# Patient Record
Sex: Male | Born: 1970
Health system: Southern US, Community
[De-identification: ages and names within clinical notes are randomized; demographics above are authoritative.]

## PROBLEM LIST (undated history)

## (undated) DIAGNOSIS — I2510 Atherosclerotic heart disease of native coronary artery without angina pectoris: Secondary | ICD-10-CM

## (undated) DIAGNOSIS — I219 Acute myocardial infarction, unspecified: Secondary | ICD-10-CM

## (undated) DIAGNOSIS — F419 Anxiety disorder, unspecified: Secondary | ICD-10-CM

## (undated) HISTORY — PX: FRACTURE SURGERY: SHX138

## (undated) HISTORY — PX: BACK SURGERY: SHX140

## (undated) HISTORY — PX: CARDIAC CATHETERIZATION: SHX172

## (undated) HISTORY — PX: CORONARY ANGIOPLASTY: SHX604

---

## 2004-11-05 ENCOUNTER — Emergency Department (HOSPITAL_COMMUNITY): Admission: EM | Admit: 2004-11-05 | Discharge: 2004-11-05 | Payer: Self-pay | Admitting: Emergency Medicine

## 2008-07-24 ENCOUNTER — Encounter: Admission: RE | Admit: 2008-07-24 | Discharge: 2008-07-24 | Payer: Self-pay | Admitting: Orthopedic Surgery

## 2008-08-27 ENCOUNTER — Ambulatory Visit (HOSPITAL_COMMUNITY): Admission: RE | Admit: 2008-08-27 | Discharge: 2008-08-28 | Payer: Self-pay | Admitting: Orthopedic Surgery

## 2008-09-25 HISTORY — PX: OTHER SURGICAL HISTORY: SHX169

## 2011-02-07 NOTE — Op Note (Signed)
NAMEPRAYAN, ULIN               ACCOUNT NO.:  [id number]   MEDICAL RECORD NO.:  [id number]          PATIENT TYPE:  OIB   LOCATION:  5041                         FACILITY:  [facility]   PHYSICIAN:  Vania Rea. Supple, M.D.  DATE OF BIRTH:  06/27/1971   DATE OF PROCEDURE:  08/27/2008  DATE OF DISCHARGE:                               OPERATIVE REPORT   PREOPERATIVE DIAGNOSIS:  Right clavicle malaligned nonunion.   POSTOPERATIVE DIAGNOSIS:  Right clavicle malaligned nonunion.   PROCEDURE:  Open reduction and internal fixation of right clavicle  nonunion with the supplementation of local bone graft.   SURGEON:  Vania Rea. Supple, MD   ASSISTANT:  Lucita Lora. Shuford, PA-C   ANESTHESIA:  General endotracheal as well as local.   ESTIMATED BLOOD LOSS:  Minimal.   DRAINS:  None.   HISTORY:  Mr. Freimark is a 40 year old gentleman who sustained a right  clavicle fracture back in 2008 and then unfortunately went onto a  nonunion which became progressively more painful.  He had recent fall  reinjuring the right shoulder and significantly increasing his symptoms.  The current examination shows a painful and locally tender nonunion of  the right mid clavicle.  Radiographs and CT confirmed a nonunion.  There  was ongoing pain and functional limitations.  He is brought to the  operating room at this time for planned right clavicle ORIF with local  bone grafting.   Preoperatively, we counseled Mr. Massman on treatment options as well as  risks versus benefits thereof.  Possible surgical complications of  bleeding, infection, neurovascular injury, malunion, nonunion, loss of  fixation, and possible need for additional surgery are reviewed.  He  understands and accepts and agrees with our planned procedure.   PROCEDURE IN DETAIL:  After undergoing routine preop evaluation, the  patient received prophylactic antibiotics.  He was placed supine on the  operating table and underwent smooth induction of a  general endotracheal  anesthesia.  He was placed in the beach-chair position, appropriately  padded and protected.  The right shoulder girdle region was sterilely  prepped and draped in the standard fashion.  A transverse incision  following the course of the clavicle was made approximately 8 cm in  length, centered over the clavicle along its more anterior margin.  Skin  flaps were elevated and electrocautery was used for hemostasis.  Dissection was carried down deeply to the subcutaneous border of the  clavicle and then electrocautery was used to divide the periosteum and  then subperiosteal elevation was used to expose the clavicle medially  and laterally to the nonunion site and the nonunion was then  circumferentially exposed with electrocautery and sharp dissection.  There was a large amount of callus and a rongeur was used to trim the  malunited bone fragments and the bone was carefully collected to be used  for later bone grafting.  The nonunion site was then readily visualized  and the interposed soft tissue of the nonunion site was then divided.  The fracture ends were exposed and the ends of the clavicle at the  fracture were then  aggressively curetted and debrided of all interposed  soft tissue and then a curette was used to reopen the medullary canal  and corrected again additional bone graft.  There was an oblique  character to the original fracture and we were able to clean the  fracture surfaces and allowed excellent graft approximation.  A trial  reduction was then held with a lobster claw clamp and then we obtained a  pre-contoured Acumed plate which we then performed some additional  contouring and had an excellent fit over the dorsal aspect of the  clavicle.  This was then temporarily held in place and we started using  an oblique lag screw across the fracture site through the plate and this  obtained excellent compression of the fracture site and held the plate   overall in good position and alignment.  We then placed a standard lag  screw through the medial and lateral aspects of the plate and then  placed 2 additional locking screws both medially and laterally and one  additional standard screw medially.  Excellent bony purchase was  achieved.  Fluoroscopic imaging was then used to confirm that the plate  had good position and there was excellent alignment of the fracture site  and all hardwares were in good position.  Wound was then copiously  irrigated.  I should mention that we did place some bone graft between  the opposing ends of the clavicle prior to the reduction and lag screw  fixation, then the additional bone graft was placed circumferentially  around the fracture site.  The periosteum was then closed about the  clavicle with interrupted figure-of-eight #1 Vicryl sutures.  The 2-0  Vicryl used the subcu layer and intracuticular 3-0 Monocryl was used for  the skin followed by Steri-Strips.  The 0.5% cm Marcaine with  epinephrine was instilled along the skin incision.  Steri-Strips were  applied.  Bulky dry dressing was placed long the incision.  The right  arm was placed in a sling.  The patient was awakened, extubated, and  taken to the recovery room in stable condition.      Vania Rea. Supple, M.D.  Electronically Signed     KMS/MEDQ  D:  08/27/2008  T:  08/28/2008  Job:  [id number]

## 2011-06-27 LAB — APTT: aPTT: 28 seconds (ref 24–37)

## 2011-06-27 LAB — CBC
HCT: 46.8 % (ref 39.0–52.0)
Hemoglobin: 15.8 g/dL (ref 13.0–17.0)
MCHC: 33.8 g/dL (ref 30.0–36.0)
MCV: 88.5 fL (ref 78.0–100.0)
Platelets: 191 10*3/uL (ref 150–400)
RBC: 5.29 MIL/uL (ref 4.22–5.81)
RDW: 12.8 % (ref 11.5–15.5)
WBC: 9 10*3/uL (ref 4.0–10.5)

## 2011-06-27 LAB — PROTIME-INR
INR: 1.1 (ref 0.00–1.49)
Prothrombin Time: 14.6 seconds (ref 11.6–15.2)

## 2011-06-27 LAB — COMPREHENSIVE METABOLIC PANEL
ALT: 28 U/L (ref 0–53)
AST: 28 U/L (ref 0–37)
Albumin: 4 g/dL (ref 3.5–5.2)
Alkaline Phosphatase: 70 U/L (ref 39–117)
BUN: 10 mg/dL (ref 6–23)
CO2: 26 mEq/L (ref 19–32)
Calcium: 9.2 mg/dL (ref 8.4–10.5)
Chloride: 107 mEq/L (ref 96–112)
Creatinine, Ser: 1.05 mg/dL (ref 0.4–1.5)
GFR calc Af Amer: >60 mL/min (ref >60)
GFR calc non Af Amer: >60 mL/min (ref >60)
Glucose, Bld: 105 mg/dL — ABNORMAL HIGH (ref 70–99)
Potassium: 4.3 mEq/L (ref 3.5–5.1)
Sodium: 140 mEq/L (ref 135–145)
Total Bilirubin: 0.5 mg/dL (ref 0.3–1.2)
Total Protein: 6.5 g/dL (ref 6.0–8.3)

## 2011-06-27 LAB — URINALYSIS, ROUTINE W REFLEX MICROSCOPIC
Bilirubin Urine: NEGATIVE
Glucose, UA: NEGATIVE mg/dL
Hgb urine dipstick: NEGATIVE
Ketones, ur: NEGATIVE mg/dL
Nitrite: NEGATIVE
Protein, ur: NEGATIVE mg/dL
Specific Gravity, Urine: 1.026 (ref 1.005–1.030)
Urobilinogen, UA: 1 mg/dL (ref 0.0–1.0)
pH: 6 (ref 5.0–8.0)

## 2012-07-24 ENCOUNTER — Other Ambulatory Visit: Payer: Self-pay | Admitting: Orthopedic Surgery

## 2012-07-25 ENCOUNTER — Encounter (HOSPITAL_COMMUNITY)
Admission: RE | Admit: 2012-07-25 | Discharge: 2012-07-25 | Disposition: A | Discharge status: Home / Self Care | Payer: 59 | Source: Ambulatory Visit | Attending: Orthopedic Surgery | Admitting: Orthopedic Surgery

## 2012-07-25 ENCOUNTER — Encounter (HOSPITAL_COMMUNITY): Payer: Self-pay

## 2012-07-25 DIAGNOSIS — L02519 Cutaneous abscess of unspecified hand: Secondary | ICD-10-CM | POA: Insufficient documentation

## 2012-07-25 MED ORDER — SODIUM CHLORIDE 0.9 % IV SOLN
Freq: Every day | INTRAVENOUS | Status: DC
  Administered 2012-07-25: 16:00:00 via INTRAVENOUS

## 2012-07-25 MED ORDER — VANCOMYCIN HCL IN DEXTROSE 1-5 GM/200ML-% IV SOLN
1000.0000 mg | INTRAVENOUS | Status: AC
  Administered 2012-07-25: 1000 mg via INTRAVENOUS
  Filled 2012-07-25: qty 200

## 2012-07-26 ENCOUNTER — Encounter (HOSPITAL_COMMUNITY)
Admission: RE | Admit: 2012-07-26 | Discharge: 2012-07-26 | Disposition: A | Discharge status: Home / Self Care | Payer: 59 | Source: Ambulatory Visit | Attending: Orthopedic Surgery | Admitting: Orthopedic Surgery

## 2012-07-26 ENCOUNTER — Encounter (HOSPITAL_COMMUNITY): Payer: Self-pay

## 2012-07-26 DIAGNOSIS — L02519 Cutaneous abscess of unspecified hand: Secondary | ICD-10-CM | POA: Insufficient documentation

## 2012-07-26 MED ORDER — SODIUM CHLORIDE 0.9 % IV SOLN
Freq: Every day | INTRAVENOUS | Status: AC
  Administered 2012-07-26: 15:00:00 via INTRAVENOUS

## 2012-07-26 MED ORDER — VANCOMYCIN HCL IN DEXTROSE 1-5 GM/200ML-% IV SOLN
1000.0000 mg | Freq: Once | INTRAVENOUS | Status: AC
  Administered 2012-07-26: 1000 mg via INTRAVENOUS
  Filled 2012-07-26: qty 200

## 2014-12-07 ENCOUNTER — Telehealth: Payer: Self-pay | Admitting: Neurology

## 2014-12-07 NOTE — Telephone Encounter (Signed)
Pt called to cancel his appt for 01/14/15. Pt did not give a reason. Janne Napoleonana S called referring provider, DR. Fulp and notified them.

## 2015-01-14 ENCOUNTER — Ambulatory Visit: Payer: Self-pay | Admitting: Neurology

## 2017-11-05 DIAGNOSIS — Z79890 Hormone replacement therapy (postmenopausal): Secondary | ICD-10-CM | POA: Diagnosis not present

## 2017-11-05 DIAGNOSIS — F5101 Primary insomnia: Secondary | ICD-10-CM | POA: Diagnosis not present

## 2017-11-08 DIAGNOSIS — R6883 Chills (without fever): Secondary | ICD-10-CM | POA: Diagnosis not present

## 2017-11-08 DIAGNOSIS — J069 Acute upper respiratory infection, unspecified: Secondary | ICD-10-CM | POA: Diagnosis not present

## 2018-05-29 DIAGNOSIS — R509 Fever, unspecified: Secondary | ICD-10-CM | POA: Diagnosis not present

## 2018-05-29 DIAGNOSIS — J029 Acute pharyngitis, unspecified: Secondary | ICD-10-CM | POA: Diagnosis not present

## 2018-06-07 DIAGNOSIS — Z23 Encounter for immunization: Secondary | ICD-10-CM | POA: Diagnosis not present

## 2018-06-07 DIAGNOSIS — F5101 Primary insomnia: Secondary | ICD-10-CM | POA: Diagnosis not present

## 2018-06-07 DIAGNOSIS — M79604 Pain in right leg: Secondary | ICD-10-CM | POA: Diagnosis not present

## 2018-06-14 DIAGNOSIS — M5431 Sciatica, right side: Secondary | ICD-10-CM | POA: Diagnosis not present

## 2018-06-14 DIAGNOSIS — M545 Low back pain: Secondary | ICD-10-CM | POA: Diagnosis not present

## 2018-06-14 DIAGNOSIS — M5416 Radiculopathy, lumbar region: Secondary | ICD-10-CM | POA: Diagnosis not present

## 2018-06-17 DIAGNOSIS — M5431 Sciatica, right side: Secondary | ICD-10-CM | POA: Diagnosis not present

## 2018-06-17 DIAGNOSIS — M545 Low back pain: Secondary | ICD-10-CM | POA: Diagnosis not present

## 2018-06-17 DIAGNOSIS — M5416 Radiculopathy, lumbar region: Secondary | ICD-10-CM | POA: Diagnosis not present

## 2018-06-22 DIAGNOSIS — M5431 Sciatica, right side: Secondary | ICD-10-CM | POA: Diagnosis not present

## 2018-06-22 DIAGNOSIS — M545 Low back pain: Secondary | ICD-10-CM | POA: Diagnosis not present

## 2018-06-22 DIAGNOSIS — M5416 Radiculopathy, lumbar region: Secondary | ICD-10-CM | POA: Diagnosis not present

## 2018-07-01 DIAGNOSIS — M5431 Sciatica, right side: Secondary | ICD-10-CM | POA: Diagnosis not present

## 2018-07-01 DIAGNOSIS — M545 Low back pain: Secondary | ICD-10-CM | POA: Diagnosis not present

## 2018-07-01 DIAGNOSIS — M5416 Radiculopathy, lumbar region: Secondary | ICD-10-CM | POA: Diagnosis not present

## 2018-07-08 DIAGNOSIS — M5431 Sciatica, right side: Secondary | ICD-10-CM | POA: Diagnosis not present

## 2018-07-08 DIAGNOSIS — M545 Low back pain: Secondary | ICD-10-CM | POA: Diagnosis not present

## 2018-07-08 DIAGNOSIS — M5416 Radiculopathy, lumbar region: Secondary | ICD-10-CM | POA: Diagnosis not present

## 2018-07-22 DIAGNOSIS — M5416 Radiculopathy, lumbar region: Secondary | ICD-10-CM | POA: Diagnosis not present

## 2018-07-22 DIAGNOSIS — M5431 Sciatica, right side: Secondary | ICD-10-CM | POA: Diagnosis not present

## 2018-07-22 DIAGNOSIS — M545 Low back pain: Secondary | ICD-10-CM | POA: Diagnosis not present

## 2018-12-16 DIAGNOSIS — F5101 Primary insomnia: Secondary | ICD-10-CM | POA: Diagnosis not present

## 2019-07-09 ENCOUNTER — Other Ambulatory Visit: Payer: Self-pay

## 2019-07-09 DIAGNOSIS — Z20822 Contact with and (suspected) exposure to covid-19: Secondary | ICD-10-CM

## 2019-07-10 LAB — NOVEL CORONAVIRUS, NAA: SARS-CoV-2, NAA: NOT DETECTED

## 2019-07-22 ENCOUNTER — Other Ambulatory Visit: Payer: Self-pay

## 2019-07-22 DIAGNOSIS — Z20822 Contact with and (suspected) exposure to covid-19: Secondary | ICD-10-CM

## 2019-07-24 LAB — NOVEL CORONAVIRUS, NAA: SARS-CoV-2, NAA: NOT DETECTED

## 2019-10-02 DIAGNOSIS — M5127 Other intervertebral disc displacement, lumbosacral region: Secondary | ICD-10-CM | POA: Insufficient documentation

## 2019-10-02 DIAGNOSIS — M25559 Pain in unspecified hip: Secondary | ICD-10-CM | POA: Insufficient documentation

## 2019-11-14 DIAGNOSIS — M5416 Radiculopathy, lumbar region: Secondary | ICD-10-CM

## 2019-11-14 DIAGNOSIS — R030 Elevated blood-pressure reading, without diagnosis of hypertension: Secondary | ICD-10-CM | POA: Insufficient documentation

## 2019-11-14 HISTORY — DX: Radiculopathy, lumbar region: M54.16

## 2020-06-17 DIAGNOSIS — I10 Essential (primary) hypertension: Secondary | ICD-10-CM | POA: Insufficient documentation

## 2020-06-17 HISTORY — DX: Essential (primary) hypertension: I10

## 2020-06-25 ENCOUNTER — Inpatient Hospital Stay (HOSPITAL_COMMUNITY)
Admission: EM | Admit: 2020-06-25 | Discharge: 2020-07-21 | DRG: 215 | Disposition: A | Discharge status: Home / Self Care | Payer: 59 | Attending: Thoracic Surgery (Cardiothoracic Vascular Surgery) | Admitting: Thoracic Surgery (Cardiothoracic Vascular Surgery)

## 2020-06-25 ENCOUNTER — Encounter (HOSPITAL_COMMUNITY): Payer: Self-pay

## 2020-06-25 ENCOUNTER — Other Ambulatory Visit: Payer: Self-pay

## 2020-06-25 ENCOUNTER — Encounter (HOSPITAL_COMMUNITY)
Admission: EM | Disposition: A | Discharge status: Home / Self Care | Payer: Self-pay | Source: Home / Self Care | Attending: Thoracic Surgery (Cardiothoracic Vascular Surgery)

## 2020-06-25 ENCOUNTER — Emergency Department (HOSPITAL_COMMUNITY): Payer: 59

## 2020-06-25 DIAGNOSIS — N179 Acute kidney failure, unspecified: Secondary | ICD-10-CM | POA: Diagnosis not present

## 2020-06-25 DIAGNOSIS — I2109 ST elevation (STEMI) myocardial infarction involving other coronary artery of anterior wall: Secondary | ICD-10-CM | POA: Diagnosis present

## 2020-06-25 DIAGNOSIS — E872 Acidosis: Secondary | ICD-10-CM | POA: Diagnosis not present

## 2020-06-25 DIAGNOSIS — R079 Chest pain, unspecified: Secondary | ICD-10-CM | POA: Diagnosis present

## 2020-06-25 DIAGNOSIS — I241 Dressler's syndrome: Secondary | ICD-10-CM | POA: Diagnosis present

## 2020-06-25 DIAGNOSIS — I213 ST elevation (STEMI) myocardial infarction of unspecified site: Secondary | ICD-10-CM

## 2020-06-25 DIAGNOSIS — D689 Coagulation defect, unspecified: Secondary | ICD-10-CM | POA: Diagnosis not present

## 2020-06-25 DIAGNOSIS — I5023 Acute on chronic systolic (congestive) heart failure: Secondary | ICD-10-CM | POA: Diagnosis not present

## 2020-06-25 DIAGNOSIS — I2510 Atherosclerotic heart disease of native coronary artery without angina pectoris: Secondary | ICD-10-CM | POA: Diagnosis present

## 2020-06-25 DIAGNOSIS — R042 Hemoptysis: Secondary | ICD-10-CM | POA: Diagnosis not present

## 2020-06-25 DIAGNOSIS — J9601 Acute respiratory failure with hypoxia: Secondary | ICD-10-CM | POA: Diagnosis not present

## 2020-06-25 DIAGNOSIS — I314 Cardiac tamponade: Secondary | ICD-10-CM | POA: Diagnosis not present

## 2020-06-25 DIAGNOSIS — K7200 Acute and subacute hepatic failure without coma: Secondary | ICD-10-CM | POA: Diagnosis not present

## 2020-06-25 DIAGNOSIS — F13239 Sedative, hypnotic or anxiolytic dependence with withdrawal, unspecified: Secondary | ICD-10-CM | POA: Diagnosis not present

## 2020-06-25 DIAGNOSIS — I309 Acute pericarditis, unspecified: Secondary | ICD-10-CM | POA: Diagnosis not present

## 2020-06-25 DIAGNOSIS — M519 Unspecified thoracic, thoracolumbar and lumbosacral intervertebral disc disorder: Secondary | ICD-10-CM | POA: Diagnosis present

## 2020-06-25 DIAGNOSIS — T8110XA Postprocedural shock unspecified, initial encounter: Secondary | ICD-10-CM | POA: Diagnosis not present

## 2020-06-25 DIAGNOSIS — S27892A Contusion of other specified intrathoracic organs, initial encounter: Secondary | ICD-10-CM | POA: Diagnosis not present

## 2020-06-25 DIAGNOSIS — I2102 ST elevation (STEMI) myocardial infarction involving left anterior descending coronary artery: Secondary | ICD-10-CM | POA: Diagnosis present

## 2020-06-25 DIAGNOSIS — R443 Hallucinations, unspecified: Secondary | ICD-10-CM | POA: Diagnosis not present

## 2020-06-25 DIAGNOSIS — I2111 ST elevation (STEMI) myocardial infarction involving right coronary artery: Principal | ICD-10-CM

## 2020-06-25 DIAGNOSIS — Z9289 Personal history of other medical treatment: Secondary | ICD-10-CM

## 2020-06-25 DIAGNOSIS — I313 Pericardial effusion (noninflammatory): Secondary | ICD-10-CM | POA: Diagnosis not present

## 2020-06-25 DIAGNOSIS — J810 Acute pulmonary edema: Secondary | ICD-10-CM | POA: Diagnosis not present

## 2020-06-25 DIAGNOSIS — Z20822 Contact with and (suspected) exposure to covid-19: Secondary | ICD-10-CM | POA: Diagnosis present

## 2020-06-25 DIAGNOSIS — Z01810 Encounter for preprocedural cardiovascular examination: Secondary | ICD-10-CM | POA: Diagnosis not present

## 2020-06-25 DIAGNOSIS — I4891 Unspecified atrial fibrillation: Secondary | ICD-10-CM | POA: Diagnosis not present

## 2020-06-25 DIAGNOSIS — Z9861 Coronary angioplasty status: Secondary | ICD-10-CM

## 2020-06-25 DIAGNOSIS — R739 Hyperglycemia, unspecified: Secondary | ICD-10-CM | POA: Diagnosis not present

## 2020-06-25 DIAGNOSIS — J181 Lobar pneumonia, unspecified organism: Secondary | ICD-10-CM | POA: Diagnosis not present

## 2020-06-25 DIAGNOSIS — J90 Pleural effusion, not elsewhere classified: Secondary | ICD-10-CM

## 2020-06-25 DIAGNOSIS — J9811 Atelectasis: Secondary | ICD-10-CM | POA: Diagnosis not present

## 2020-06-25 DIAGNOSIS — D62 Acute posthemorrhagic anemia: Secondary | ICD-10-CM | POA: Diagnosis not present

## 2020-06-25 DIAGNOSIS — E440 Moderate protein-calorie malnutrition: Secondary | ICD-10-CM | POA: Diagnosis not present

## 2020-06-25 DIAGNOSIS — R451 Restlessness and agitation: Secondary | ICD-10-CM | POA: Diagnosis not present

## 2020-06-25 DIAGNOSIS — K761 Chronic passive congestion of liver: Secondary | ICD-10-CM | POA: Diagnosis present

## 2020-06-25 DIAGNOSIS — I2119 ST elevation (STEMI) myocardial infarction involving other coronary artery of inferior wall: Secondary | ICD-10-CM | POA: Diagnosis present

## 2020-06-25 DIAGNOSIS — I97631 Postprocedural hematoma of a circulatory system organ or structure following cardiac bypass: Secondary | ICD-10-CM | POA: Diagnosis not present

## 2020-06-25 DIAGNOSIS — Z951 Presence of aortocoronary bypass graft: Secondary | ICD-10-CM | POA: Diagnosis not present

## 2020-06-25 DIAGNOSIS — Z79899 Other long term (current) drug therapy: Secondary | ICD-10-CM | POA: Diagnosis not present

## 2020-06-25 DIAGNOSIS — R5082 Postprocedural fever: Secondary | ICD-10-CM | POA: Diagnosis not present

## 2020-06-25 DIAGNOSIS — D6959 Other secondary thrombocytopenia: Secondary | ICD-10-CM | POA: Diagnosis not present

## 2020-06-25 DIAGNOSIS — I25110 Atherosclerotic heart disease of native coronary artery with unstable angina pectoris: Secondary | ICD-10-CM | POA: Diagnosis not present

## 2020-06-25 DIAGNOSIS — R570 Cardiogenic shock: Secondary | ICD-10-CM | POA: Diagnosis not present

## 2020-06-25 DIAGNOSIS — J189 Pneumonia, unspecified organism: Secondary | ICD-10-CM | POA: Diagnosis not present

## 2020-06-25 DIAGNOSIS — F419 Anxiety disorder, unspecified: Secondary | ICD-10-CM | POA: Diagnosis present

## 2020-06-25 DIAGNOSIS — Z95811 Presence of heart assist device: Secondary | ICD-10-CM | POA: Diagnosis not present

## 2020-06-25 DIAGNOSIS — G4700 Insomnia, unspecified: Secondary | ICD-10-CM | POA: Diagnosis not present

## 2020-06-25 DIAGNOSIS — I5022 Chronic systolic (congestive) heart failure: Secondary | ICD-10-CM | POA: Diagnosis not present

## 2020-06-25 DIAGNOSIS — J811 Chronic pulmonary edema: Secondary | ICD-10-CM

## 2020-06-25 DIAGNOSIS — I255 Ischemic cardiomyopathy: Secondary | ICD-10-CM | POA: Diagnosis present

## 2020-06-25 DIAGNOSIS — Z9889 Other specified postprocedural states: Secondary | ICD-10-CM

## 2020-06-25 DIAGNOSIS — I5021 Acute systolic (congestive) heart failure: Secondary | ICD-10-CM | POA: Diagnosis not present

## 2020-06-25 DIAGNOSIS — Z978 Presence of other specified devices: Secondary | ICD-10-CM

## 2020-06-25 DIAGNOSIS — I509 Heart failure, unspecified: Secondary | ICD-10-CM

## 2020-06-25 DIAGNOSIS — I340 Nonrheumatic mitral (valve) insufficiency: Secondary | ICD-10-CM | POA: Diagnosis not present

## 2020-06-25 DIAGNOSIS — Z4659 Encounter for fitting and adjustment of other gastrointestinal appliance and device: Secondary | ICD-10-CM

## 2020-06-25 DIAGNOSIS — I5043 Acute on chronic combined systolic (congestive) and diastolic (congestive) heart failure: Secondary | ICD-10-CM | POA: Diagnosis not present

## 2020-06-25 DIAGNOSIS — Z9911 Dependence on respirator [ventilator] status: Secondary | ICD-10-CM | POA: Diagnosis not present

## 2020-06-25 HISTORY — DX: Atherosclerotic heart disease of native coronary artery without angina pectoris: I25.10

## 2020-06-25 HISTORY — PX: LEFT HEART CATH AND CORONARY ANGIOGRAPHY: CATH118249

## 2020-06-25 HISTORY — PX: CORONARY THROMBECTOMY: CATH118304

## 2020-06-25 HISTORY — DX: Acute myocardial infarction, unspecified: I21.9

## 2020-06-25 HISTORY — DX: Anxiety disorder, unspecified: F41.9

## 2020-06-25 HISTORY — PX: CORONARY BALLOON ANGIOPLASTY: CATH118233

## 2020-06-25 LAB — CBC
HCT: 53.4 % — ABNORMAL HIGH (ref 39.0–52.0)
Hemoglobin: 17.3 g/dL — ABNORMAL HIGH (ref 13.0–17.0)
MCH: 29.5 pg (ref 26.0–34.0)
MCHC: 32.4 g/dL (ref 30.0–36.0)
MCV: 91.1 fL (ref 80.0–100.0)
Platelets: 322 10*3/uL (ref 150–400)
RBC: 5.86 MIL/uL — ABNORMAL HIGH (ref 4.22–5.81)
RDW: 13.1 % (ref 11.5–15.5)
WBC: 29.5 10*3/uL — ABNORMAL HIGH (ref 4.0–10.5)
nRBC: 0 % (ref 0.0–0.2)

## 2020-06-25 LAB — APTT: aPTT: 30 seconds (ref 24–36)

## 2020-06-25 LAB — HEMOGLOBIN A1C
Hgb A1c MFr Bld: 5.6 % (ref 4.8–5.6)
Mean Plasma Glucose: 114.02 mg/dL

## 2020-06-25 LAB — PROTIME-INR
INR: 1.2 (ref 0.8–1.2)
Prothrombin Time: 14.4 seconds (ref 11.4–15.2)

## 2020-06-25 SURGERY — LEFT HEART CATH AND CORONARY ANGIOGRAPHY
Anesthesia: LOCAL

## 2020-06-25 MED ORDER — METOPROLOL TARTRATE 12.5 MG HALF TABLET
12.5000 mg | ORAL_TABLET | Freq: Two times a day (BID) | ORAL | Status: DC
  Filled 2020-06-25: qty 1

## 2020-06-25 MED ORDER — HEPARIN SODIUM (PORCINE) 5000 UNIT/ML IJ SOLN: 60.0000 [IU]/kg | Freq: Once | INTRAMUSCULAR | Status: DC

## 2020-06-25 MED ORDER — ASPIRIN 81 MG PO CHEW
CHEWABLE_TABLET | ORAL | Status: AC
  Administered 2020-06-25: 324 mg via ORAL
  Filled 2020-06-25: qty 4

## 2020-06-25 MED ORDER — NITROGLYCERIN 0.4 MG SL SUBL: 0.4000 mg | SUBLINGUAL_TABLET | SUBLINGUAL | Status: DC | PRN

## 2020-06-25 MED ORDER — ALPRAZOLAM 0.25 MG PO TABS
0.2500 mg | ORAL_TABLET | Freq: Two times a day (BID) | ORAL | Status: DC | PRN
  Administered 2020-06-27 (×2): 0.25 mg via ORAL
  Filled 2020-06-25 (×2): qty 1

## 2020-06-25 MED ORDER — SODIUM CHLORIDE 0.9 % IV SOLN
INTRAVENOUS | Status: DC
  Administered 2020-07-01: 10 mL via INTRAVENOUS

## 2020-06-25 MED ORDER — HEPARIN (PORCINE) IN NACL 1000-0.9 UT/500ML-% IV SOLN
INTRAVENOUS | Status: AC
  Filled 2020-06-25: qty 1000

## 2020-06-25 MED ORDER — HEPARIN SODIUM (PORCINE) 5000 UNIT/ML IJ SOLN
INTRAMUSCULAR | Status: AC
  Administered 2020-06-25: 4000 [IU] via INTRAVENOUS
  Filled 2020-06-25: qty 1

## 2020-06-25 MED ORDER — HEPARIN SODIUM (PORCINE) 5000 UNIT/ML IJ SOLN: 4000.0000 [IU] | Freq: Once | INTRAMUSCULAR | Status: AC

## 2020-06-25 MED ORDER — FENTANYL CITRATE (PF) 100 MCG/2ML IJ SOLN
INTRAMUSCULAR | Status: AC
  Administered 2020-06-25: 50 ug via INTRAVENOUS
  Filled 2020-06-25: qty 2

## 2020-06-25 MED ORDER — HEPARIN BOLUS VIA INFUSION
4000.0000 [IU] | Freq: Once | INTRAVENOUS | Status: DC
  Filled 2020-06-25: qty 4000

## 2020-06-25 MED ORDER — ASPIRIN EC 81 MG PO TBEC: 81.0000 mg | DELAYED_RELEASE_TABLET | Freq: Every day | ORAL | Status: DC

## 2020-06-25 MED ORDER — HEPARIN SODIUM (PORCINE) 1000 UNIT/ML IJ SOLN
4000.0000 [IU] | Freq: Once | INTRAMUSCULAR | Status: DC
  Filled 2020-06-25: qty 4

## 2020-06-25 MED ORDER — ASPIRIN 81 MG PO CHEW: 324.0000 mg | CHEWABLE_TABLET | Freq: Once | ORAL | Status: AC

## 2020-06-25 MED ORDER — VERAPAMIL HCL 2.5 MG/ML IV SOLN
INTRAVENOUS | Status: AC
  Filled 2020-06-25: qty 2

## 2020-06-25 MED ORDER — LIDOCAINE HCL (PF) 1 % IJ SOLN
INTRAMUSCULAR | Status: AC
  Filled 2020-06-25: qty 30

## 2020-06-25 MED ORDER — ZOLPIDEM TARTRATE 5 MG PO TABS: 5.0000 mg | ORAL_TABLET | Freq: Every evening | ORAL | Status: DC | PRN

## 2020-06-25 MED ORDER — HEPARIN SODIUM (PORCINE) 1000 UNIT/ML IJ SOLN
INTRAMUSCULAR | Status: AC
  Filled 2020-06-25: qty 1

## 2020-06-25 MED ORDER — NITROGLYCERIN 0.4 MG SL SUBL
SUBLINGUAL_TABLET | SUBLINGUAL | Status: AC
  Administered 2020-06-25: 0.4 mg via SUBLINGUAL
  Filled 2020-06-25: qty 3

## 2020-06-25 MED ORDER — FENTANYL CITRATE (PF) 100 MCG/2ML IJ SOLN: 50.0000 ug | Freq: Once | INTRAMUSCULAR | Status: AC

## 2020-06-25 MED ORDER — ACETAMINOPHEN 325 MG PO TABS
650.0000 mg | ORAL_TABLET | ORAL | Status: DC | PRN
  Administered 2020-06-27: 650 mg via ORAL
  Filled 2020-06-25: qty 2

## 2020-06-25 MED ORDER — ONDANSETRON HCL 4 MG/2ML IJ SOLN
4.0000 mg | Freq: Four times a day (QID) | INTRAMUSCULAR | Status: DC | PRN
  Administered 2020-06-30 – 2020-07-04 (×3): 4 mg via INTRAVENOUS
  Filled 2020-06-25 (×4): qty 2

## 2020-06-25 MED ORDER — ATORVASTATIN CALCIUM 80 MG PO TABS
80.0000 mg | ORAL_TABLET | Freq: Every day | ORAL | Status: DC
  Administered 2020-06-26 – 2020-06-27 (×2): 80 mg via ORAL
  Filled 2020-06-25 (×2): qty 1

## 2020-06-25 MED ORDER — NITROGLYCERIN 0.4 MG SL SUBL
0.4000 mg | SUBLINGUAL_TABLET | SUBLINGUAL | Status: DC | PRN
  Administered 2020-06-25 (×2): 0.4 mg via SUBLINGUAL

## 2020-06-25 MED ORDER — IOHEXOL 350 MG/ML SOLN
INTRAVENOUS | Status: AC
  Filled 2020-06-25: qty 1

## 2020-06-25 MED ORDER — ASPIRIN 300 MG RE SUPP: 300.0000 mg | RECTAL | Status: AC

## 2020-06-25 MED ORDER — ASPIRIN 81 MG PO CHEW
324.0000 mg | CHEWABLE_TABLET | ORAL | Status: AC
  Administered 2020-06-26: 324 mg via ORAL
  Filled 2020-06-25: qty 4

## 2020-06-25 SURGICAL SUPPLY — 19 items
BALLN SAPPHIRE 2.5X12 (BALLOONS) ×2
BALLN SAPPHIRE 3.0X15 (BALLOONS) ×2
BALLOON SAPPHIRE 2.5X12 (BALLOONS) IMPLANT
BALLOON SAPPHIRE 3.0X15 (BALLOONS) IMPLANT
CATH 5FR JL3.5 JR4 ANG PIG MP (CATHETERS) ×1 IMPLANT
CATH EXTRAC PRONTO 5.5F 138CM (CATHETERS) ×1 IMPLANT
CATH LAUNCHER 6FR JR4 (CATHETERS) ×1 IMPLANT
DEVICE RAD COMP TR BAND LRG (VASCULAR PRODUCTS) ×1 IMPLANT
GLIDESHEATH SLEND SS 6F .021 (SHEATH) ×1 IMPLANT
GUIDEWIRE INQWIRE 1.5J.035X260 (WIRE) IMPLANT
INQWIRE 1.5J .035X260CM (WIRE) ×2
KIT ENCORE 26 ADVANTAGE (KITS) ×1 IMPLANT
KIT HEART LEFT (KITS) ×2 IMPLANT
KIT HEMO VALVE WATCHDOG (MISCELLANEOUS) ×1 IMPLANT
PACK CARDIAC CATHETERIZATION (CUSTOM PROCEDURE TRAY) ×2 IMPLANT
SYR MEDRAD MARK 7 150ML (SYRINGE) ×2 IMPLANT
TRANSDUCER W/STOPCOCK (MISCELLANEOUS) ×2 IMPLANT
TUBING CIL FLEX 10 FLL-RA (TUBING) ×2 IMPLANT
WIRE COUGAR XT STRL 190CM (WIRE) ×1 IMPLANT

## 2020-06-25 NOTE — ED Triage Notes (Signed)
Pt c/o chest pain since 730-8am this am, described as heartburn.  EKG down to MD, stemi called, pt transported to room

## 2020-06-25 NOTE — ED Notes (Signed)
Pt now requiring 6L O2 via [place]

## 2020-06-25 NOTE — ED Provider Notes (Signed)
[facility] EMERGENCY DEPARTMENT Provider Note  CSN: [id number] Arrival date & time: 06/25/20 2301  Chief Complaint(s) Chest Pain  HPI Jerry [name] is a 49 y.o. male who is relatively healthy presents to the emergency department with substernal chest pain described as a tingling/pinprick sensation that radiates to bilateral extremities.  Patient reports that the pain began this morning around 7 or 8:00.  It has been constant since onset. Has intensified to a severe pain this evening.  Patient initially thought it was indigestion and attempted to take over-the-counter and home medications without relief.  He is endorsing generalized fatigue and mild shortness of breath. Denies hypertension, hyperlipidemia, diabetes, smoking.  No family history of heart attacks at early age.  Patient recently underwent a lumbar micro discectomy.    HPI  Past Medical History History reviewed. No pertinent past medical history. Patient Active Problem List   Diagnosis Date Noted  . STEMI (ST elevation myocardial infarction) (HCC) 06/25/2020   Home Medication(s) Prior to Admission medications   Medication Sig Start Date End Date Taking? Authorizing Provider  Creatine 5000 MG PACK Take 5,000 mg by mouth 3 (three) times a week.    [provider]  Multiple Vitamins-Minerals (CENTRUM ULTRA MENS) TABS Take 1 tablet by mouth.    [provider]  Sulfamethoxazole-Trimethoprim (BACTRIM PO) Take 500 mg by mouth 2 (two) times daily.    [provider]                                                                                                                                    Past Surgical History Past Surgical History:  Procedure Laterality Date  . right clavicle fracture reapir  2010   Family History History reviewed. No pertinent family history.  Social History Social History   Tobacco Use  . Smoking status: Never Smoker  Substance Use Topics  .  Alcohol use: Yes    Alcohol/week: 1.0 standard drink    Types: 1 drink(s) per week  . Drug use: No   Allergies Patient has no known allergies.  Review of Systems Review of Systems All other systems are reviewed and are negative for acute change except as noted in the HPI  Physical Exam Vital Signs  I have reviewed the triage vital signs BP (!) 133/95 (BP Location: Left Arm)   Pulse 98   Temp 97.9 F (36.6 C) (Oral)   Resp 16   SpO2 94%   Physical Exam Vitals reviewed.  Constitutional:      General: He is not in acute distress.    Appearance: He is well-developed. He is ill-appearing. He is not diaphoretic.  HENT:     Head: Normocephalic and atraumatic.     Nose: Nose normal.  Eyes:     General: No scleral icterus.       Right eye: No discharge.        Left eye: No  discharge.     Conjunctiva/sclera: Conjunctivae normal.     Pupils: Pupils are equal, round, and reactive to light.  Cardiovascular:     Rate and Rhythm: Regular rhythm. Tachycardia present.     Heart sounds: No murmur heard.  No friction rub. No gallop.   Pulmonary:     Effort: Pulmonary effort is normal. No respiratory distress.     Breath sounds: No stridor. Examination of the right-lower field reveals rales. Examination of the left-lower field reveals rales. Rales present.  Abdominal:     General: There is no distension.     Palpations: Abdomen is soft.     Tenderness: There is no abdominal tenderness.  Musculoskeletal:        General: No tenderness.     Cervical back: Normal range of motion and neck supple.  Skin:    General: Skin is warm and dry.     Findings: No erythema or rash.  Neurological:     Mental Status: He is alert and oriented to person, place, and time.     ED Results and Treatments Labs (all labs ordered are listed, but only abnormal results are displayed) Labs Reviewed  CBC - Abnormal; Notable for the following components:      Result Value   WBC 29.5 (*)    RBC 5.86 (*)     Hemoglobin 17.3 (*)    HCT 53.4 (*)    All other components within normal limits  RESPIRATORY PANEL BY RT PCR (FLU A&B, COVID)  HEMOGLOBIN A1C  PROTIME-INR  APTT  BASIC METABOLIC PANEL  LIPID PANEL  BRAIN NATRIURETIC PEPTIDE  HIV ANTIBODY (ROUTINE TESTING W REFLEX)  TSH  HEMOGLOBIN A1C  BRAIN NATRIURETIC PEPTIDE  BASIC METABOLIC PANEL  LIPID PANEL  CBC  TROPONIN I (HIGH SENSITIVITY)  TROPONIN I (HIGH SENSITIVITY)                                                                                                                         EKG  EKG Interpretation  Date/Time:  Friday June 25 2020 23:16:03 EDT Ventricular Rate:  95 PR Interval:  144 QRS Duration: 92 QT Interval:  350 QTC Calculation: 439 R Axis:   96 Text Interpretation: ** Critical Test Result: STEMI Normal sinus rhythm Possible Left atrial enlargement Rightward axis Inferior infarct , possibly acute Anterior infarct , age undetermined 66 ** ACUTE MI / STEMI ** ** Consider right ventricular involvement in acute inferior infarct Abnormal ECG No old tracing to compare Confirmed by Drema Pry [phone]) on 06/25/2020 11:31:20 PM      Radiology DG Chest Portable 1 View  Result Date: 06/25/2020 CLINICAL DATA:  STEMI. EXAM: PORTABLE CHEST 1 VIEW COMPARISON:  None. FINDINGS: Heart is normal in size. Bilateral interstitial thickening and Kerley B-lines consistent with pulmonary edema. Hazy lung base opacities likely pleural effusions, left greater than right. Apical blebs with chain sutures on the left. No pneumothorax. Surgical hardware in the right clavicle. IMPRESSION: 1. Moderate  pulmonary edema. Probable bilateral pleural effusions, left greater than right. 2. Postsurgical change in the left hemithorax. Electronically Signed   By: Narda Rutherford M.D.   On: 06/25/2020 23:46    Pertinent labs & imaging results that were available during my care of the patient were reviewed by me and considered in my medical decision  making (see chart for details).  Medications Ordered in ED Medications  0.9 %  sodium chloride infusion (has no administration in time range)  nitroGLYCERIN (NITROSTAT) SL tablet 0.4 mg (0.4 mg Sublingual Given 06/25/20 2342)  aspirin chewable tablet 324 mg (has no administration in time range)    Or  aspirin suppository 300 mg (has no administration in time range)  aspirin EC tablet 81 mg (has no administration in time range)  acetaminophen (TYLENOL) tablet 650 mg (has no administration in time range)  ondansetron (ZOFRAN) injection 4 mg (has no administration in time range)  metoprolol tartrate (LOPRESSOR) tablet 12.5 mg (has no administration in time range)  atorvastatin (LIPITOR) tablet 80 mg (has no administration in time range)  zolpidem (AMBIEN) tablet 5 mg (has no administration in time range)  ALPRAZolam (XANAX) tablet 0.25 mg (has no administration in time range)  aspirin chewable tablet 324 mg (324 mg Oral Given 06/25/20 2332)  fentaNYL (SUBLIMAZE) injection 50 mcg (50 mcg Intravenous Given 06/25/20 2331)  heparin injection 4,000 Units (4,000 Units Intravenous Given 06/25/20 2345)                                                                                                                                    Procedures .1-3 Lead EKG Interpretation Performed by: Nira Conn, MD Authorized by: Nira Conn, MD     Interpretation: normal     ECG rate:  98   ECG rate assessment: normal     Rhythm: sinus rhythm     Ectopy: none     Conduction: normal   .Critical Care Performed by: Nira Conn, MD Authorized by: Nira Conn, MD    CRITICAL CARE Performed by: Amadeo Garnet  Total critical care time: 30 minutes Critical care time was exclusive of separately billable procedures and treating other patients. Critical care was necessary to treat or prevent imminent or life-threatening deterioration. Critical care was time spent  personally by me on the following activities: development of treatment plan with patient and/or surrogate as well as nursing, discussions with consultants, evaluation of patient's response to treatment, examination of patient, obtaining history from patient or surrogate, ordering and performing treatments and interventions, ordering and review of laboratory studies, ordering and review of radiographic studies, pulse oximetry and re-evaluation of patient's condition.   (including critical care time)  Medical Decision Making / ED Course I have reviewed the nursing notes for this encounter and the patient's prior records (if available in EHR or on provided paperwork).   Alistar Mcenery was evaluated in Emergency Department on  06/26/2020 for the symptoms described in the history of present illness. He was evaluated in the context of the global COVID-19 pandemic, which necessitated consideration that the patient might be at risk for infection with the SARS-CoV-2 virus that causes COVID-19. Institutional protocols and algorithms that pertain to the evaluation of patients at risk for COVID-19 are in a state of rapid change based on information released by regulatory bodies including the CDC and federal and state organizations. These policies and algorithms were followed during the patient's care in the ED.    Clinical Course as of Jun 26 1  Fri Jun 25, 2020  2340 Code STEMI activated due to EKG changes consistent with inferior MI. Code STEMI order set used. Patient is hemodynamically stable. Given aspirin, nitroglycerin, heparin bolus, and IV fluids.  Patient is mildly hypoxic with sats in the high 80s and low 90s with crackles at the bases.  Chest x-ray notable for bibasilar edema, concerning for heart failure likely from the MI. Responding to [facility].  Spoke with Dr. Excell Seltzer who is arranging for Catheterization. Seen By Dr. Daphine Deutscher of Cardiology at bedside while awaiting for Cath.   [PC]  2357 Pain  improving with fentanyl and NTG.   [PC]  Sat Jun 26, 2020  0002 Taken to Cath.   [PC]    Clinical Course User Index [PC] , Amadeo Garnet, MD     Final Clinical Impression(s) / ED Diagnoses Final diagnoses:  STEMI involving right coronary artery [facility])      This chart was dictated using voice recognition software.  Despite best efforts to proofread,  errors can occur which can change the documentation meaning.   Nira Conn, MD 06/26/20 0002

## 2020-06-25 NOTE — H&P (Signed)
Cardiology Admission History and Physical:   Patient ID: Jerry [name] MRN: [id number]; DOB: [birth year]   Admission date: 06/25/2020  Primary Care Provider: Cain Saupe, MD [facility] Cardiologist: No primary care provider on file.  [facility] Electrophysiologist:  None   Chief Complaint:  CP/STEMI  Patient Profile:   Jerry [name] is a 49 y.o. male with no significant PMHx who presents w/ CP and EKG showing ST elevation in inferior leads.  History of Present Illness:   Jerry [name] says he had onset of CP earlier this morning that he thought might be acid reflux or heartburn. He describes as a "tingling" and "burning" pain in the center of his chest, random in onset, gradually increasing in severity throughout the day today. Pain has essentially been constant since this morning w/o relief. No significant associated sx. He has tried multiple things to get relief, including lying down to rest and acid medication. Nothing has significantly improved the pain. Due to ongoing nature, he had his girlfriend bring him in to be evaluated in the ED. EKG in triage showed ST elevations in the inferior leads. No prior cardiac hx; no h/o prior anginal sx.  No past medical history on file. no significant PMHx  Past Surgical History:  Procedure Laterality Date  . right clavicle fracture reapir  2010     Medications Prior to Admission: Prior to Admission medications   Medication Sig Start Date End Date Taking? Authorizing Provider  Creatine 5000 MG PACK Take 5,000 mg by mouth 3 (three) times a week.    [provider]  Multiple Vitamins-Minerals (CENTRUM ULTRA MENS) TABS Take 1 tablet by mouth.    [provider]  Sulfamethoxazole-Trimethoprim (BACTRIM PO) Take 500 mg by mouth 2 (two) times daily.    [provider]     Allergies:   No Known Allergies  Social History:   Social History   Socioeconomic History  . Marital status: Single    Spouse name: Not on  file  . Number of children: Not on file  . Years of education: Not on file  . Highest education level: Not on file  Occupational History  . Not on file  Tobacco Use  . Smoking status: Never Smoker  Substance and Sexual Activity  . Alcohol use: Yes    Alcohol/week: 1.0 standard drink    Types: 1 drink(s) per week  . Drug use: No  . Sexual activity: Not on file  Other Topics Concern  . Not on file  Social History Narrative  . Not on file   Social Determinants of Health   Financial Resource Strain:   . Difficulty of Paying Living Expenses: Not on file  Food Insecurity:   . Worried About Programme researcher, broadcasting/film/video in the Last Year: Not on file  . Ran Out of Food in the Last Year: Not on file  Transportation Needs:   . Lack of Transportation (Medical): Not on file  . Lack of Transportation (Non-Medical): Not on file  Physical Activity:   . Days of Exercise per Week: Not on file  . Minutes of Exercise per Session: Not on file  Stress:   . Feeling of Stress : Not on file  Social Connections:   . Frequency of Communication with Friends and Family: Not on file  . Frequency of Social Gatherings with Friends and Family: Not on file  . Attends Religious Services: Not on file  . Active Member of Clubs or Organizations: Not on file  .  Attends Banker Meetings: Not on file  . Marital Status: Not on file  Intimate Partner Violence:   . Fear of Current or Ex-Partner: Not on file  . Emotionally Abused: Not on file  . Physically Abused: Not on file  . Sexually Abused: Not on file    Family History:   The patient's family history is not on file.   No early CAD or MI per pt  ROS:  Please see the history of present illness.  All other ROS reviewed and negative.     Physical Exam/Data:   Vitals:   06/25/20 2325  BP: (!) 133/95  Pulse: 98  Resp: 16  Temp: 97.9 F (36.6 C)  TempSrc: Oral  SpO2: 94%   No intake or output data in the 24 hours ending 06/25/20 2328 Last 3  Weights 07/25/2012  Weight (lbs) 163 lb  Weight (kg) 73.936 kg     There is no height or weight on file to calculate BMI.  General:  Well nourished, well developed, looks uncomfortable HEENT: normal Lymph: no adenopathy Neck: no JVD Endocrine:  No thryomegaly Vascular: No carotid bruits; DP pulses 2+ bilaterally   Cardiac:  normal S1, S2; RRR; no murmur  Lungs:  Few bibasilar rales  Abd: soft, nontender, no hepatomegaly  Ext: no edema Musculoskeletal:  No deformities Skin: warm and dry  Neuro:  no focal abnormalities noted Psych:  Normal affect    EKG:  The ECG that was done 06-25-20 was personally reviewed and demonstrates NSR with ST elevation in inferior leads  Relevant CV Studies: none  Laboratory Data:  High Sensitivity Troponin:  No results for input(s): TROPONINIHS in the last 720 hours.    ChemistryNo results for input(s): NA, K, CL, CO2, GLUCOSE, BUN, CREATININE, CALCIUM, GFRNONAA, GFRAA, ANIONGAP in the last 168 hours.  No results for input(s): PROT, ALBUMIN, AST, ALT, ALKPHOS, BILITOT in the last 168 hours. HematologyNo results for input(s): WBC, RBC, HGB, HCT, MCV, MCH, MCHC, RDW, PLT in the last 168 hours. BNPNo results for input(s): BNP, PROBNP in the last 168 hours.  DDimer No results for input(s): DDIMER in the last 168 hours.   Radiology/Studies:  No results found.     TIMI Risk Score for ST  Elevation MI:   The patient's TIMI risk score is 3, which indicates a 4.4% risk of all cause mortality at 30 days.       Assessment and Plan:   1. CP/STEMI: inferior ST elevation on EKG, ongoing pain. Will plan for urgent LHC for further evaluation. -TSH, FLP, HgbA1c for risk stratification -TTE in AM for baseline assessment  For questions or updates, please contact [facility] Please consult [email or web address] for contact info under     Signed, Precious Reel, MD, [facility]  06/25/2020 11:28 PM

## 2020-06-26 ENCOUNTER — Inpatient Hospital Stay (HOSPITAL_COMMUNITY): Payer: 59

## 2020-06-26 ENCOUNTER — Inpatient Hospital Stay (HOSPITAL_COMMUNITY)
Admission: EM | Disposition: A | Discharge status: Home / Self Care | Payer: Self-pay | Source: Home / Self Care | Attending: Thoracic Surgery (Cardiothoracic Vascular Surgery)

## 2020-06-26 ENCOUNTER — Other Ambulatory Visit (HOSPITAL_COMMUNITY): Payer: 59

## 2020-06-26 DIAGNOSIS — I213 ST elevation (STEMI) myocardial infarction of unspecified site: Secondary | ICD-10-CM

## 2020-06-26 DIAGNOSIS — I25110 Atherosclerotic heart disease of native coronary artery with unstable angina pectoris: Secondary | ICD-10-CM

## 2020-06-26 DIAGNOSIS — R079 Chest pain, unspecified: Secondary | ICD-10-CM

## 2020-06-26 DIAGNOSIS — I2510 Atherosclerotic heart disease of native coronary artery without angina pectoris: Secondary | ICD-10-CM

## 2020-06-26 HISTORY — PX: RIGHT HEART CATH: CATH118263

## 2020-06-26 HISTORY — PX: CORONARY/GRAFT ACUTE MI REVASCULARIZATION: CATH118305

## 2020-06-26 LAB — BASIC METABOLIC PANEL
Anion gap: 15 (ref 5–15)
Anion gap: 15 (ref 5–15)
BUN: 14 mg/dL (ref 6–20)
BUN: 14 mg/dL (ref 6–20)
CO2: 23 mmol/L (ref 22–32)
CO2: 22 mmol/L (ref 22–32)
Calcium: 9.6 mg/dL (ref 8.9–10.3)
Calcium: 9.2 mg/dL (ref 8.9–10.3)
Chloride: 102 mmol/L (ref 98–111)
Chloride: 103 mmol/L (ref 98–111)
Creatinine, Ser: 1.02 mg/dL (ref 0.61–1.24)
Creatinine, Ser: 1 mg/dL (ref 0.61–1.24)
GFR calc Af Amer: >60 mL/min (ref >60)
GFR calc Af Amer: >60 mL/min (ref >60)
GFR calc non Af Amer: >60 mL/min (ref >60)
GFR calc non Af Amer: >60 mL/min (ref >60)
Glucose, Bld: 138 mg/dL — ABNORMAL HIGH (ref 70–99)
Glucose, Bld: 142 mg/dL — ABNORMAL HIGH (ref 70–99)
Potassium: 4 mmol/L (ref 3.5–5.1)
Potassium: 4.1 mmol/L (ref 3.5–5.1)
Sodium: 140 mmol/L (ref 135–145)
Sodium: 140 mmol/L (ref 135–145)

## 2020-06-26 LAB — CBC
HCT: 54.5 % — ABNORMAL HIGH (ref 39.0–52.0)
Hemoglobin: 17.5 g/dL — ABNORMAL HIGH (ref 13.0–17.0)
MCH: 29.7 pg (ref 26.0–34.0)
MCHC: 32.1 g/dL (ref 30.0–36.0)
MCV: 92.5 fL (ref 80.0–100.0)
Platelets: 343 10*3/uL (ref 150–400)
RBC: 5.89 MIL/uL — ABNORMAL HIGH (ref 4.22–5.81)
RDW: 13.4 % (ref 11.5–15.5)
WBC: 32 10*3/uL — ABNORMAL HIGH (ref 4.0–10.5)
nRBC: 0 % (ref 0.0–0.2)

## 2020-06-26 LAB — BRAIN NATRIURETIC PEPTIDE
B Natriuretic Peptide: 349.1 pg/mL — ABNORMAL HIGH (ref 0.0–100.0)
B Natriuretic Peptide: 368.4 pg/mL — ABNORMAL HIGH (ref 0.0–100.0)

## 2020-06-26 LAB — ECHOCARDIOGRAM COMPLETE
AR max vel: 2.7 cm2
AV Area VTI: 2.8 cm2
AV Area mean vel: 2.62 cm2
AV Mean grad: 2 mmHg
AV Peak grad: 3.1 mmHg
Ao pk vel: 0.87 m/s
Height: 68 in
S' Lateral: 3.7 cm
Weight: 2691.38 oz

## 2020-06-26 LAB — LIPID PANEL
Cholesterol: 185 mg/dL (ref 0–200)
HDL: 31 mg/dL — ABNORMAL LOW (ref >40)
LDL Cholesterol: 121 mg/dL — ABNORMAL HIGH (ref 0–99)
Total CHOL/HDL Ratio: 6 RATIO
Triglycerides: 165 mg/dL — ABNORMAL HIGH (ref <150)
VLDL: 33 mg/dL (ref 0–40)

## 2020-06-26 LAB — POCT I-STAT EG7
Acid-Base Excess: 3 mmol/L — ABNORMAL HIGH (ref 0.0–2.0)
Bicarbonate: 26.7 mmol/L (ref 20.0–28.0)
Calcium, Ion: 1.2 mmol/L (ref 1.15–1.40)
HCT: 47 % (ref 39.0–52.0)
Hemoglobin: 16 g/dL (ref 13.0–17.0)
O2 Saturation: 60 %
Potassium: 3.6 mmol/L (ref 3.5–5.1)
Sodium: 141 mmol/L (ref 135–145)
TCO2: 28 mmol/L (ref 22–32)
pCO2, Ven: 38.5 mmHg — ABNORMAL LOW (ref 44.0–60.0)
pH, Ven: 7.449 — ABNORMAL HIGH (ref 7.250–7.430)
pO2, Ven: 30 mmHg — CL (ref 32.0–45.0)

## 2020-06-26 LAB — MRSA PCR SCREENING: MRSA by PCR: NEGATIVE

## 2020-06-26 LAB — RESPIRATORY PANEL BY RT PCR (FLU A&B, COVID)
Influenza A by PCR: NEGATIVE
Influenza B by PCR: NEGATIVE
SARS Coronavirus 2 by RT PCR: NEGATIVE

## 2020-06-26 LAB — HIV ANTIBODY (ROUTINE TESTING W REFLEX): HIV Screen 4th Generation wRfx: NONREACTIVE

## 2020-06-26 LAB — TSH: TSH: 1.488 u[IU]/mL (ref 0.350–4.500)

## 2020-06-26 LAB — TROPONIN I (HIGH SENSITIVITY)
Troponin I (High Sensitivity): >27000 ng/L (ref <18)
Troponin I (High Sensitivity): >27000 ng/L (ref <18)

## 2020-06-26 SURGERY — CORONARY/GRAFT ACUTE MI REVASCULARIZATION
Anesthesia: LOCAL

## 2020-06-26 MED ORDER — FUROSEMIDE 10 MG/ML IJ SOLN
20.0000 mg | Freq: Once | INTRAMUSCULAR | Status: AC
  Administered 2020-06-26: 20 mg via INTRAVENOUS
  Filled 2020-06-26: qty 2

## 2020-06-26 MED ORDER — HEPARIN (PORCINE) IN NACL 1000-0.9 UT/500ML-% IV SOLN
INTRAVENOUS | Status: DC | PRN
  Administered 2020-06-26: 500 mL

## 2020-06-26 MED ORDER — MILRINONE LACTATE IN DEXTROSE 20-5 MG/100ML-% IV SOLN
0.2500 ug/kg/min | INTRAVENOUS | Status: DC
  Filled 2020-06-26: qty 100

## 2020-06-26 MED ORDER — IOHEXOL 350 MG/ML SOLN
INTRAVENOUS | Status: DC | PRN
  Administered 2020-06-26: 70 mL via INTRA_ARTERIAL

## 2020-06-26 MED ORDER — TIROFIBAN HCL IN NACL 5-0.9 MG/100ML-% IV SOLN: 0.1500 ug/kg/min | INTRAVENOUS | Status: DC

## 2020-06-26 MED ORDER — FUROSEMIDE 10 MG/ML IJ SOLN: 40.0000 mg | Freq: Once | INTRAMUSCULAR | Status: DC

## 2020-06-26 MED ORDER — NITROGLYCERIN 1 MG/10 ML FOR IR/CATH LAB
INTRA_ARTERIAL | Status: AC
  Filled 2020-06-26: qty 10

## 2020-06-26 MED ORDER — HEPARIN (PORCINE) IN NACL 1000-0.9 UT/500ML-% IV SOLN
INTRAVENOUS | Status: DC | PRN
  Administered 2020-06-26 (×2): 500 mL

## 2020-06-26 MED ORDER — HEPARIN (PORCINE) 25000 UT/250ML-% IV SOLN
1950.0000 [IU]/h | INTRAVENOUS | Status: DC
  Administered 2020-06-26: 1200 [IU]/h via INTRAVENOUS
  Administered 2020-06-27: 1650 [IU]/h via INTRAVENOUS
  Administered 2020-06-28 – 2020-06-29 (×3): 1800 [IU]/h via INTRAVENOUS
  Administered 2020-06-29 – 2020-06-30 (×2): 1950 [IU]/h via INTRAVENOUS
  Administered 2020-07-01 – 2020-07-02 (×2): 2100 [IU]/h via INTRAVENOUS
  Administered 2020-07-02: 2050 [IU]/h via INTRAVENOUS
  Administered 2020-07-03: 1950 [IU]/h via INTRAVENOUS
  Administered 2020-07-03: 2050 [IU]/h via INTRAVENOUS
  Administered 2020-07-04 – 2020-07-07 (×5): 1950 [IU]/h via INTRAVENOUS
  Filled 2020-06-26 (×19): qty 250

## 2020-06-26 MED ORDER — LIDOCAINE HCL (PF) 1 % IJ SOLN
INTRAMUSCULAR | Status: AC
  Filled 2020-06-26: qty 30

## 2020-06-26 MED ORDER — HEPARIN (PORCINE) IN NACL 1000-0.9 UT/500ML-% IV SOLN
INTRAVENOUS | Status: AC
  Filled 2020-06-26: qty 1500

## 2020-06-26 MED ORDER — LIDOCAINE HCL (PF) 1 % IJ SOLN
INTRAMUSCULAR | Status: DC | PRN
  Administered 2020-06-26: 5 mL via SUBCUTANEOUS

## 2020-06-26 MED ORDER — HYDRALAZINE HCL 20 MG/ML IJ SOLN: 10.0000 mg | INTRAMUSCULAR | Status: AC | PRN

## 2020-06-26 MED ORDER — PERFLUTREN LIPID MICROSPHERE
1.0000 mL | INTRAVENOUS | Status: AC | PRN
  Administered 2020-06-26: 4 mL via INTRAVENOUS
  Filled 2020-06-26: qty 10

## 2020-06-26 MED ORDER — SODIUM CHLORIDE 0.9 % IV SOLN
INTRAVENOUS | Status: AC | PRN
  Administered 2020-06-26: 10 mL/h via INTRAVENOUS

## 2020-06-26 MED ORDER — LABETALOL HCL 5 MG/ML IV SOLN: 10.0000 mg | INTRAVENOUS | Status: AC | PRN

## 2020-06-26 MED ORDER — MORPHINE SULFATE (PF) 2 MG/ML IV SOLN
2.0000 mg | INTRAVENOUS | Status: DC | PRN
  Administered 2020-06-26 – 2020-06-27 (×9): 2 mg via INTRAVENOUS
  Filled 2020-06-26 (×9): qty 1

## 2020-06-26 MED ORDER — ASPIRIN 81 MG PO CHEW
81.0000 mg | CHEWABLE_TABLET | Freq: Every day | ORAL | Status: DC
  Administered 2020-06-26: 81 mg via ORAL
  Filled 2020-06-26 (×2): qty 1

## 2020-06-26 MED ORDER — MILRINONE LACTATE IN DEXTROSE 20-5 MG/100ML-% IV SOLN: 0.2500 ug/kg/min | INTRAVENOUS | Status: DC

## 2020-06-26 MED ORDER — TIROFIBAN HCL IN NACL 5-0.9 MG/100ML-% IV SOLN
INTRAVENOUS | Status: AC
  Filled 2020-06-26: qty 100

## 2020-06-26 MED ORDER — MILRINONE LACTATE IN DEXTROSE 20-5 MG/100ML-% IV SOLN
0.3750 ug/kg/min | INTRAVENOUS | Status: DC
  Administered 2020-06-26 – 2020-06-27 (×2): 0.25 ug/kg/min via INTRAVENOUS
  Administered 2020-06-28 – 2020-07-03 (×12): 0.375 ug/kg/min via INTRAVENOUS
  Administered 2020-07-03 – 2020-07-05 (×3): 0.25 ug/kg/min via INTRAVENOUS
  Filled 2020-06-26 (×17): qty 100

## 2020-06-26 MED ORDER — TIROFIBAN HCL IN NACL 5-0.9 MG/100ML-% IV SOLN
0.1500 ug/kg/min | INTRAVENOUS | Status: AC
  Administered 2020-06-26 (×2): 0.15 ug/kg/min via INTRAVENOUS
  Filled 2020-06-26 (×2): qty 100

## 2020-06-26 MED ORDER — SODIUM CHLORIDE 0.9 % IV SOLN
250.0000 mL | INTRAVENOUS | Status: DC | PRN
  Administered 2020-06-26: 250 mL via INTRAVENOUS

## 2020-06-26 MED ORDER — FENTANYL CITRATE (PF) 100 MCG/2ML IJ SOLN
INTRAMUSCULAR | Status: AC
  Filled 2020-06-26: qty 2

## 2020-06-26 MED ORDER — TIROFIBAN HCL IN NACL 5-0.9 MG/100ML-% IV SOLN
INTRAVENOUS | Status: AC | PRN
  Administered 2020-06-26: 0.15 ug/kg/min via INTRAVENOUS

## 2020-06-26 MED ORDER — MIDAZOLAM HCL 2 MG/2ML IJ SOLN
INTRAMUSCULAR | Status: AC
  Filled 2020-06-26: qty 2

## 2020-06-26 MED ORDER — SODIUM CHLORIDE 0.9% FLUSH: 3.0000 mL | INTRAVENOUS | Status: DC | PRN

## 2020-06-26 MED ORDER — SPIRONOLACTONE 12.5 MG HALF TABLET
12.5000 mg | ORAL_TABLET | Freq: Every day | ORAL | Status: DC
  Administered 2020-06-26 – 2020-06-27 (×2): 12.5 mg via ORAL
  Filled 2020-06-26 (×2): qty 1

## 2020-06-26 MED ORDER — HEPARIN SODIUM (PORCINE) 1000 UNIT/ML IJ SOLN
INTRAMUSCULAR | Status: DC | PRN
  Administered 2020-06-26: 3000 [IU] via INTRAVENOUS
  Administered 2020-06-26: 4000 [IU] via INTRAVENOUS

## 2020-06-26 MED ORDER — SODIUM CHLORIDE 0.9% FLUSH
3.0000 mL | Freq: Two times a day (BID) | INTRAVENOUS | Status: DC
  Administered 2020-06-26 – 2020-07-06 (×15): 3 mL via INTRAVENOUS

## 2020-06-26 MED ORDER — TIROFIBAN (AGGRASTAT) BOLUS VIA INFUSION
INTRAVENOUS | Status: DC | PRN
  Administered 2020-06-26: 2017.5 ug via INTRAVENOUS

## 2020-06-26 MED ORDER — MIDAZOLAM HCL 2 MG/2ML IJ SOLN
INTRAMUSCULAR | Status: DC | PRN
  Administered 2020-06-26: 1 mg via INTRAVENOUS

## 2020-06-26 MED ORDER — LIDOCAINE HCL (PF) 1 % IJ SOLN
INTRAMUSCULAR | Status: DC | PRN
  Administered 2020-06-26: 8 mL

## 2020-06-26 MED ORDER — VERAPAMIL HCL 2.5 MG/ML IV SOLN
INTRAVENOUS | Status: DC | PRN
  Administered 2020-06-26: 10 mL via INTRA_ARTERIAL

## 2020-06-26 MED ORDER — FENTANYL CITRATE (PF) 100 MCG/2ML IJ SOLN
INTRAMUSCULAR | Status: DC | PRN
Start: 2020-06-26 — End: 2020-06-26
  Administered 2020-06-26: 25 ug via INTRAVENOUS

## 2020-06-26 MED ORDER — CHLORHEXIDINE GLUCONATE CLOTH 2 % EX PADS
6.0000 | MEDICATED_PAD | Freq: Every day | CUTANEOUS | Status: DC
  Administered 2020-06-26 – 2020-07-06 (×9): 6 via TOPICAL

## 2020-06-26 SURGICAL SUPPLY — 12 items
CATH INFINITI JR4 5F (CATHETERS) ×1 IMPLANT
CATH SWAN GANZ VIP 7.5F (CATHETERS) ×1 IMPLANT
KIT HEART LEFT (KITS) ×2 IMPLANT
KIT MICROPUNCTURE NIT STIFF (SHEATH) ×1 IMPLANT
PACK CARDIAC CATHETERIZATION (CUSTOM PROCEDURE TRAY) ×2 IMPLANT
SHEATH PINNACLE 6F 10CM (SHEATH) ×1 IMPLANT
SHEATH PINNACLE 8F 10CM (SHEATH) ×1 IMPLANT
SHEATH PROBE COVER 6X72 (BAG) ×1 IMPLANT
SLEEVE REPOSITIONING LENGTH 30 (MISCELLANEOUS) ×1 IMPLANT
TRANSDUCER W/STOPCOCK (MISCELLANEOUS) ×2 IMPLANT
WIRE EMERALD 3MM-J .035X150CM (WIRE) ×1 IMPLANT
WIRE MICROINTRODUCER 60CM (WIRE) ×1 IMPLANT

## 2020-06-26 NOTE — ED Notes (Addendum)
100 mcg fentanyl overridden; 50 mcg fentanyl given per verbal order from MD Cardama. Witnessed/verified with Neurosurgeon, wasted in sink, no waste needed in pyxis.

## 2020-06-26 NOTE — Progress Notes (Signed)
PHARMACY CONSULT NOTE - Initial Consult  Pharmacy Consult for tirofiban Indication: STEMI s/p angioplasty - awaiting CABG  No Known Allergies  Patient Measurements: Height: 5\' 8"  (172.7 cm) Weight: 76.3 kg (168 lb 3.4 oz) IBW/kg (Calculated) : 68.4  Vital Signs: Temp: 98.9 F (37.2 C) (10/02 0115) Temp Source: Oral (10/02 0115) BP: 101/74 (10/02 0054) Pulse Rate: 93 (10/02 0059)  Labs: Recent Labs    06/25/20 2323 06/25/20 2334  HGB 17.3*  --   HCT 53.4*  --   PLT 322  --   APTT  --  30  LABPROT  --  14.4  INR  --  1.2  CREATININE 1.02  --   TROPONINIHS >27,000*  --     Estimated Creatinine Clearance: 84.8 mL/min (by C-G formula based on SCr of 1.02 mg/dL).   Medical History: History reviewed. No pertinent past medical history.  Medications:  Medications Prior to Admission  Medication Sig Dispense Refill Last Dose  . Creatine 5000 MG PACK Take 5,000 mg by mouth 3 (three) times a week.     . Multiple Vitamins-Minerals (CENTRUM ULTRA MENS) TABS Take 1 tablet by mouth.     . Sulfamethoxazole-Trimethoprim (BACTRIM PO) Take 500 mg by mouth 2 (two) times daily.       Assessment: 49 y.o. M presented as STEMI - s/p angioplasty. Cardiac surgery consulted for consideration of multivessel CABG. Spoke with Dr. 54 and he would like tirofiban x 48 hours but no heparin. Noted pt with lumbar microdiscectomy ~48 hours ago.   Plan:  Tirofiban 0.15 mcg/kg/min x 48 hours Will f/u daily platelets  Excell Seltzer, PharmD, BCPS Please see amion for complete clinical pharmacist phone list 06/26/2020,1:26 AM

## 2020-06-26 NOTE — Progress Notes (Signed)
Progress Note  Patient Name: Jerry [name] Date of Encounter: 06/26/2020  Primary Cardiologist: Tonny Bollman, MD  Subjective   Stinging chest discomfort if he takes a deep breath.  No orthopnea or PND.  No leg weakness.  Inpatient Medications    Scheduled Meds: . aspirin  81 mg Oral Daily  . atorvastatin  80 mg Oral Daily  . metoprolol tartrate  12.5 mg Oral BID  . sodium chloride flush  3 mL Intravenous Q12H   Continuous Infusions: . sodium chloride 10 mL/hr at 06/26/20 0307  . sodium chloride 10 mL/hr at 06/26/20 0700  . tirofiban 0.15 mcg/kg/min (06/26/20 0700)   PRN Meds: sodium chloride, acetaminophen, ALPRAZolam, morphine injection, nitroGLYCERIN, ondansetron (ZOFRAN) IV, sodium chloride flush, zolpidem   Vital Signs    Vitals:   06/26/20 0400 06/26/20 0500 06/26/20 0600 06/26/20 0700  BP: (!) 121/99 92/65 105/76 95/78  Pulse: 87 99 (!) 103 100  Resp: (!) 34 (!) 36 (!) 29 (!) 29  Temp:      TempSrc:      SpO2: 94% 92% 90% 93%  Weight:      Height:        Intake/Output Summary (Last 24 hours) at 06/26/2020 0844 Last data filed at 06/26/2020 0700 Gross per 24 hour  Intake 252.82 ml  Output 200 ml  Net 52.82 ml   Filed Weights   06/25/20 2347 06/26/20 0115  Weight: 80.7 kg 76.3 kg    Telemetry    Sinus rhythm.  Personally reviewed.  ECG    An ECG dated 06/26/2020 was personally reviewed today and demonstrated:  Sinus rhythm with recent inferior STEMI and age-indeterminate anterolateral infarct pattern.  Physical Exam   GEN: No acute distress.   Neck: No JVD. Cardiac: RRR, no murmur, rub, or gallop.  Respiratory: Nonlabored. Clear to auscultation bilaterally. GI: Soft, nontender, bowel sounds present. MS: No edema; No deformity. Neuro:  Nonfocal. Psych: Alert and oriented x 3. Normal affect.  Labs    Chemistry Recent Labs  Lab 06/25/20 2323 06/26/20 0150  NA 140 140  K 4.0 4.1  CL 102 103  CO2 23 22  GLUCOSE 138* 142*  BUN 14 14   CREATININE 1.02 1.00  CALCIUM 9.6 9.2  GFRNONAA >60 >60  GFRAA >60 >60  ANIONGAP 15 15     Hematology Recent Labs  Lab 06/25/20 2323 06/26/20 0150  WBC 29.5* 32.0*  RBC 5.86* 5.89*  HGB 17.3* 17.5*  HCT 53.4* 54.5*  MCV 91.1 92.5  MCH 29.5 29.7  MCHC 32.4 32.1  RDW 13.1 13.4  PLT 322 343    Cardiac Enzymes Recent Labs  Lab 06/25/20 2323 06/26/20 0150  TROPONINIHS >27,000* >27,000*    BNP Recent Labs  Lab 06/25/20 2334 06/26/20 0150  BNP 349.1* 368.4*     Radiology    CARDIAC CATHETERIZATION  Result Date: 06/26/2020 1.  Late presenting inferolateral STEMI with ongoing chest pain, secondary to thrombotic occlusion of the distal RCA, treated with balloon angioplasty and aspiration thrombectomy 2.  Chronic total occlusion of the LAD just after the first septal perforator, collateralized with right to left collaterals primarily 3.  Severe complex stenosis of the proximal circumflex 4.  Severe global and segmental LV systolic dysfunction with severely elevated LVEDP of 31 mmHg The patient has very severe multivessel coronary artery disease.  His presentation is complicated by acute systolic heart failure and late presenting MI.  Plan:  Diurese with IV furosemide  Continue IV Aggrastat  Consult  cardiac surgery for consideration of multivessel CABG once he is stabilized, case discussed with Dr. Dorris Fetch  Case discussed with neurosurgery on-call since the patient is only 48 hours from lumbar microdiscectomy.  He should have serial neurologic exams but reasonable to continue parenteral anticoagulant/antiplatelet therapy as needed for his severe cardiac disease  Check 2D echo for full assessment of LV function and evaluation of valvular disease  DG Chest Portable 1 View  Result Date: 06/25/2020 CLINICAL DATA:  STEMI. EXAM: PORTABLE CHEST 1 VIEW COMPARISON:  None. FINDINGS: Heart is normal in size. Bilateral interstitial thickening and Kerley B-lines consistent with  pulmonary edema. Hazy lung base opacities likely pleural effusions, left greater than right. Apical blebs with chain sutures on the left. No pneumothorax. Surgical hardware in the right clavicle. IMPRESSION: 1. Moderate pulmonary edema. Probable bilateral pleural effusions, left greater than right. 2. Postsurgical change in the left hemithorax. Electronically Signed   By: Narda Rutherford M.D.   On: 06/25/2020 23:46    Patient Profile     49 y.o. male status post recent lumbar microdiscectomy 48 hours prior to presentation with inferior STEMI and urgent cardiac catheterization.  Assessment & Plan    1.  Inferior STEMI with thrombotic occlusion of the distal RCA treated with aspiration thrombectomy and PTCA.  Segmental LV systolic dysfunction noted at angiography with estimated LVEF 25 to 35%.  LVEDP was significantly elevated.  2.  CAD including chronic occlusion of the LAD just beyond the first septal perforator with right to left collaterals, severe complex proximal circumflex stenosis.  3.  LDL 121, started on high-dose Lipitor.  4.  Recent lumbar microdiscectomy 48 hours prior to presentation.  I reviewed overnight course and recommendations per Dr. Excell Seltzer.  Consultation planned with TCTS to discuss CABG.  Patient continues on aspirin, Lipitor, Lopressor, and a 48-hour course of Aggrastat.  Echocardiogram pending.  Start Aldactone 12.5 mg daily, will also give a dose of IV Lasix today.  Hold off on ARB until course of blood pressure better clarified, recent systolics in the 90s to 120s.  Signed, Nona Dell, MD  06/26/2020, 8:44 AM

## 2020-06-26 NOTE — Progress Notes (Signed)
Interventional Note: The patient is interviewed and examined.  His mother is at the bedside.  I have discussed his case extensively with Dr. Diona Browner and Dr. Dorris Fetch.  He has had a very large infarct and has become increasingly uncomfortable this morning.  He has also become more tachycardic.  The patient is having fairly severe pleuritic chest discomfort and feels like he cannot catch his breath.  A stat echo has been completed and this has been personally reviewed.  The patient has severe segmental LV systolic dysfunction with akinesis of the inferolateral wall and periapical akinesis with an LV thrombus present.  RV function appears intact.  I am concerned that he is in the early stages of cardiogenic shock.  I have reviewed options with him and plan to bring him to the cardiac catheterization lab for Swan-Ganz catheter insertion.  Pending review of his cardiac output and intracardiac hemodynamics, will consider mechanical support with an intra-aortic balloon pump if needed.  An Impella device would be contraindicated in the context of LV apical thrombus.  We will also likely convert him from intravenous Aggrastat over to heparin in the presence of LV thrombus.  All of this is discussed with the patient and his mother at the bedside.  Their questions are answered.  Risks, indications, and alternatives of right internal jugular venous access, Swan-Ganz catheter placement, possible intra-aortic balloon pump placement, and relook coronary angiography plus/minus PCI are all reviewed.  Tonny Bollman 06/26/2020 1:16 PM

## 2020-06-26 NOTE — Progress Notes (Signed)
Patient began having chest pain 7/10 upon initial bedside rounding during report. Patient was given 2mg  morphine, BP borderline hypotensive this am, did not give nitro.   Patient's pain unrelieved by morphine. Patient began to feel SOB, pt stated "it hurts to breathe" RR 30-35 pt on 6L [place]; O2 sats 95. Patient given second dose of morphine and McDowell MD paged. See MAR.   Patient appears distressed; pt is tense, grimacing, and restless.   Instructed by MD to give 20mg  lasix and continue to monitor. MD agreed nitro may not be appropriate at this time to support patient's blood pressure. Patient remains on aggrastat. Will continue to monitor patient.    

## 2020-06-26 NOTE — Progress Notes (Signed)
  Echocardiogram 2D Echocardiogram has been performed.  Jerry [name] 06/26/2020, 12:32 PM

## 2020-06-26 NOTE — Consult Note (Signed)
Reason for Consult:3 vessel CAD MI Referring Physician: Drs. Copper/ Kmarion Rawl is an 49 y.o. male.  HPI: 49 yo man with no prior cardiac history presents with CP  Mr. Masterson is a 49 yo man who underwent a microdiscectomy 2 days prior to admission. Presented to ED last night with > 12 hours of Cp. Initially tried to self medicate with antacids and lying down but pain persisted. In ED ECG showed inferior ST elevation.  Taken to cath lab emergently. Total occlusion of the RCA- opened but also CTO of LAD and severe circumflex disease. Not stented. Plan for medical tune up then CABG once optimized.  This morning complains of severe CP with deep inspiration. Feels short of breath. ECG unchanged  History reviewed. No pertinent past medical history.  Past Surgical History:  Procedure Laterality Date  . right clavicle fracture reapir  2010    History reviewed. No pertinent family history.  Social History:  reports that he has never smoked. He does not have any smokeless tobacco history on file. He reports current alcohol use of about 1.0 standard drink of alcohol per week. He reports that he does not use drugs.  Allergies: No Known Allergies  Medications:  Scheduled: . aspirin  81 mg Oral Daily  . atorvastatin  80 mg Oral Daily  . metoprolol tartrate  12.5 mg Oral BID  . sodium chloride flush  3 mL Intravenous Q12H  . spironolactone  12.5 mg Oral Daily    Results for orders placed or performed during the hospital encounter of 06/25/20 (from the past 48 hour(s))  Basic metabolic panel     Status: Abnormal   Collection Time: 06/25/20 11:23 PM  Result Value Ref Range   Sodium 140 135 - 145 mmol/L   Potassium 4.0 3.5 - 5.1 mmol/L   Chloride 102 98 - 111 mmol/L   CO2 23 22 - 32 mmol/L   Glucose, Bld 138 (H) 70 - 99 mg/dL    Comment: Glucose reference range applies only to samples taken after fasting for at least 8 hours.   BUN 14 6 - 20 mg/dL   Creatinine, Ser 3.00 0.61 -  1.24 mg/dL   Calcium 9.6 8.9 - 92.3 mg/dL   GFR calc non Af Amer >60 >60 mL/min   GFR calc Af Amer >60 >60 mL/min   Anion gap 15 5 - 15    Comment: Performed at [facility], [address], [place], [place] [address]  CBC     Status: Abnormal   Collection Time: 06/25/20 11:23 PM  Result Value Ref Range   WBC 29.5 (H) 4.0 - 10.5 K/uL   RBC 5.86 (H) 4.22 - 5.81 MIL/uL   Hemoglobin 17.3 (H) 13.0 - 17.0 g/dL   HCT 22.6 (H) 39 - 52 %   MCV 91.1 80.0 - 100.0 fL   MCH 29.5 26.0 - 34.0 pg   MCHC 32.4 30.0 - 36.0 g/dL   RDW 33.3 54.5 - 62.5 %   Platelets 322 150 - 400 K/uL   nRBC 0.0 0.0 - 0.2 %    Comment: Performed at [facility], [address] [place], [place] [address]  Troponin I (High Sensitivity)     Status: Abnormal   Collection Time: 06/25/20 11:23 PM  Result Value Ref Range   Troponin I (High Sensitivity) >27,000 (HH) <18 ng/L    Comment: RESULT CONFIRMED BY AUTOMATED DILUTION CRITICAL RESULT CALLED TO, READ BACK BY AND VERIFIED WITH:  Etheleen Nicks RN [id number] Myra Gianotti Performed at [facility], [address], [place], [place] [address]   Lipid panel     Status: Abnormal   Collection Time: 06/25/20 11:23 PM  Result Value Ref Range   Cholesterol 185 0 - 200 mg/dL   Triglycerides 469 (H) <150 mg/dL   HDL 31 (L) >62 mg/dL   Total CHOL/HDL Ratio 6.0 RATIO   VLDL 33 0 - 40 mg/dL   LDL Cholesterol 952 (H) 0 - 99 mg/dL    Comment:        Total Cholesterol/HDL:CHD Risk Coronary Heart Disease Risk Table                     Men   Women  1/2 Average Risk   3.4   3.3  Average Risk       5.0   4.4  2 X Average Risk   9.6   7.1  3 X Average Risk  23.4   11.0        Use the calculated Patient Ratio above and the CHD Risk Table to determine the patient's CHD Risk.        ATP III CLASSIFICATION (LDL):  <100     mg/dL   Optimal  841-324  mg/dL   Near or Above                    Optimal  130-159  mg/dL   Borderline  401-027  mg/dL   High  >253      mg/dL   Very High Performed at [facility], [address] [place], [place] [address]   Respiratory Panel by RT PCR (Flu A&B, Covid) - Nasopharyngeal Swab     Status: None   Collection Time: 06/25/20 11:30 PM   Specimen: Nasopharyngeal Swab  Result Value Ref Range   SARS Coronavirus 2 by RT PCR NEGATIVE NEGATIVE    Comment: (NOTE) SARS-CoV-2 target nucleic acids are NOT DETECTED.  The SARS-CoV-2 RNA is generally detectable in upper respiratoy specimens during the acute phase of infection. The lowest concentration of SARS-CoV-2 viral copies this assay can detect is 131 copies/mL. A negative result does not preclude SARS-Cov-2 infection and should not be used as the sole basis for treatment or other patient management decisions. A negative result may occur with  improper specimen collection/handling, submission of specimen other than nasopharyngeal swab, presence of viral mutation(s) within the areas targeted by this assay, and inadequate number of viral copies (<131 copies/mL). A negative result must be combined with clinical observations, patient history, and epidemiological information. The expected result is Negative.  Fact Sheet for Patients:  https://www.moore.com/  Fact Sheet for Healthcare Providers:  https://www.young.biz/  This test is no t yet approved or cleared by the [place] FDA and  has been authorized for detection and/or diagnosis of SARS-CoV-2 by FDA under an Emergency Use Authorization (EUA). This EUA will remain  in effect (meaning this test can be used) for the duration of the COVID-19 declaration under Section 564(b)(1) of the Act, 21 U.S.C. section 360bbb-3(b)(1), unless the authorization is terminated or revoked sooner.     Influenza A by PCR NEGATIVE NEGATIVE   Influenza B by PCR NEGATIVE NEGATIVE    Comment: (NOTE) The Xpert Xpress SARS-CoV-2/FLU/RSV assay is intended as an aid in  the diagnosis of  influenza from Nasopharyngeal swab specimens and  should not be used as a sole basis for treatment.  Nasal washings and  aspirates are unacceptable for Xpert Xpress SARS-CoV-2/FLU/RSV  testing.  Fact Sheet for Patients: https://www.moore.com/  Fact Sheet for Healthcare Providers: https://www.young.biz/  This test is not yet approved or cleared by the [place] FDA and  has been authorized for detection and/or diagnosis of SARS-CoV-2 by  FDA under an Emergency Use Authorization (EUA). This EUA will remain  in effect (meaning this test can be used) for the duration of the  Covid-19 declaration under Section 564(b)(1) of the Act, 21  U.S.C. section 360bbb-3(b)(1), unless the authorization is  terminated or revoked. Performed at [facility], [address], [place], [place] [address]   Hemoglobin A1c     Status: None   Collection Time: 06/25/20 11:34 PM  Result Value Ref Range   Hgb A1c MFr Bld 5.6 4.8 - 5.6 %    Comment: (NOTE) Pre diabetes:          5.7%-6.4%  Diabetes:              >6.4%  Glycemic control for   <7.0% adults with diabetes    Mean Plasma Glucose 114.02 mg/dL    Comment: Performed at [facility], [address] [place], [place] [address]  Protime-INR     Status: None   Collection Time: 06/25/20 11:34 PM  Result Value Ref Range   Prothrombin Time 14.4 11.4 - 15.2 seconds   INR 1.2 0.8 - 1.2    Comment: (NOTE) INR goal varies based on device and disease states. Performed at [facility], [address], [place], [place] [address]   APTT     Status: None   Collection Time: 06/25/20 11:34 PM  Result Value Ref Range   aPTT 30 24 - 36 seconds    Comment: Performed at [facility], [address] [place], [place] [address]  Brain natriuretic peptide     Status: Abnormal   Collection Time: 06/25/20 11:34 PM  Result Value Ref Range   B Natriuretic Peptide 349.1 (H) 0.0 - 100.0 pg/mL     Comment: Performed at [facility], [address] [place], [place] [address]  MRSA PCR Screening     Status: None   Collection Time: 06/26/20  1:23 AM   Specimen: Nasal Mucosa; Nasopharyngeal  Result Value Ref Range   MRSA by PCR NEGATIVE NEGATIVE    Comment:        The GeneXpert MRSA Assay (FDA approved for NASAL specimens only), is one component of a comprehensive MRSA colonization surveillance program. It is not intended to diagnose MRSA infection nor to guide or monitor treatment for MRSA infections. Performed at [facility], [address], [place], [place] [address]   TSH     Status: None   Collection Time: 06/26/20  1:50 AM  Result Value Ref Range   TSH 1.488 0.350 - 4.500 uIU/mL    Comment: Performed by a 3rd Generation assay with a functional sensitivity of <=0.01 uIU/mL. Performed at [facility], [address], [place], [place] [address]   Brain natriuretic peptide     Status: Abnormal   Collection Time: 06/26/20  1:50 AM  Result Value Ref Range   B Natriuretic Peptide 368.4 (H) 0.0 - 100.0 pg/mL    Comment: Performed at [facility], [address], [place], [place] [address]  Basic metabolic panel     Status: Abnormal   Collection Time: 06/26/20  1:50  AM  Result Value Ref Range   Sodium 140 135 - 145 mmol/L   Potassium 4.1 3.5 - 5.1 mmol/L   Chloride 103 98 - 111 mmol/L   CO2 22 22 - 32 mmol/L   Glucose, Bld 142 (H) 70 - 99 mg/dL    Comment: Glucose reference range applies only to samples taken after fasting for at least 8 hours.   BUN 14 6 - 20 mg/dL   Creatinine, Ser 2.35 0.61 - 1.24 mg/dL   Calcium 9.2 8.9 - 36.1 mg/dL   GFR calc non Af Amer >60 >60 mL/min   GFR calc Af Amer >60 >60 mL/min   Anion gap 15 5 - 15    Comment: Performed at [facility], [address], [place], [place] [address]  Troponin I (High Sensitivity)     Status: Abnormal   Collection Time: 06/26/20  1:50 AM  Result Value Ref Range   Troponin I  (High Sensitivity) >27,000 (HH) <18 ng/L    Comment: RESULT CONFIRMED BY AUTOMATED DILUTION CRITICAL VALUE NOTED.  VALUE IS CONSISTENT WITH PREVIOUSLY REPORTED AND CALLED VALUE. Performed at [facility], [address], [place], [place] [address]   CBC     Status: Abnormal   Collection Time: 06/26/20  1:50 AM  Result Value Ref Range   WBC 32.0 (H) 4.0 - 10.5 K/uL   RBC 5.89 (H) 4.22 - 5.81 MIL/uL   Hemoglobin 17.5 (H) 13.0 - 17.0 g/dL   HCT 76.1 (H) 39 - 52 %   MCV 92.5 80.0 - 100.0 fL   MCH 29.7 26.0 - 34.0 pg   MCHC 32.1 30.0 - 36.0 g/dL   RDW 95.0 93.2 - 67.1 %   Platelets 343 150 - 400 K/uL   nRBC 0.0 0.0 - 0.2 %    Comment: Performed at [facility], [address], [place], [place] [address]    CARDIAC CATHETERIZATION  Result Date: 06/26/2020 1.  Late presenting inferolateral STEMI with ongoing chest pain, secondary to thrombotic occlusion of the distal RCA, treated with balloon angioplasty and aspiration thrombectomy 2.  Chronic total occlusion of the LAD just after the first septal perforator, collateralized with right to left collaterals primarily 3.  Severe complex stenosis of the proximal circumflex 4.  Severe global and segmental LV systolic dysfunction with severely elevated LVEDP of 31 mmHg The patient has very severe multivessel coronary artery disease.  His presentation is complicated by acute systolic heart failure and late presenting MI.  Plan:  Diurese with IV furosemide  Continue IV Aggrastat  Consult cardiac surgery for consideration of multivessel CABG once he is stabilized, case discussed with Dr. Dorris Fetch  Case discussed with neurosurgery on-call since the patient is only 48 hours from lumbar microdiscectomy.  He should have serial neurologic exams but reasonable to continue parenteral anticoagulant/antiplatelet therapy as needed for his severe cardiac disease  Check 2D echo for full assessment of LV function and evaluation of valvular disease  DG  Chest Portable 1 View  Result Date: 06/25/2020 CLINICAL DATA:  STEMI. EXAM: PORTABLE CHEST 1 VIEW COMPARISON:  None. FINDINGS: Heart is normal in size. Bilateral interstitial thickening and Kerley B-lines consistent with pulmonary edema. Hazy lung base opacities likely pleural effusions, left greater than right. Apical blebs with chain sutures on the left. No pneumothorax. Surgical hardware in the right clavicle. IMPRESSION: 1. Moderate pulmonary edema. Probable bilateral pleural effusions, left greater than right. 2. Postsurgical change in the left hemithorax. Electronically  Signed   By: Narda RutherfordMelanie  Sanford M.D.   On: 06/25/2020 23:46   I personally reviewed the cath images and concur with the findings noted above  Review of Systems  Respiratory: Positive for shortness of breath.   Cardiovascular: Positive for chest pain. Negative for leg swelling.  Musculoskeletal: Positive for back pain.   Blood pressure 95/78, pulse (!) 102, temperature 98.9 F (37.2 C), temperature source Oral, resp. rate (!) 34, height 5\' 8"  (1.727 m), weight 76.3 kg, SpO2 93 %. Physical Exam Vitals reviewed.  Constitutional:      General: He is in acute distress (mild).     Appearance: He is not diaphoretic.  HENT:     Head: Normocephalic and atraumatic.  Eyes:     Extraocular Movements: Extraocular movements intact.  Neck:     Vascular: No carotid bruit.  Cardiovascular:     Rate and Rhythm: Tachycardia present.     Heart sounds: No murmur heard.  Friction rub (loud) present. Gallop present. S3 sounds present.   Pulmonary:     Effort: Pulmonary effort is normal. No respiratory distress.     Breath sounds: Normal breath sounds.  Abdominal:     General: There is no distension.     Palpations: Abdomen is soft.  Musculoskeletal:        General: No swelling.     Cervical back: Neck supple.  Skin:    General: Skin is warm and dry.  Neurological:     General: No focal deficit present.     Mental Status: He is  alert and oriented to person, place, and time.     Cranial Nerves: No cranial nerve deficit.     Motor: No weakness.     Assessment/Plan: Mr. Bernita Buffyrater is a 49 yo man with no prior cardiac history who presents with > 12 hours of CP and ruled in for an inferior STEMI with troponin > 27 K. BNP also elevated. He had angioplasty but no stent for acutely occluded RCA. Has residual CTo of LAD and complex circumflex stenosis.  Presented late in AMI and had markedly elevated LVEDP of 30. Plan is to optimize medically prior to definitive revascularization with CABG.   This morning he is having significant CP without ECG changes. He has a loud rub on exam so there may be a component of pericarditis/ Dressler's syndrome.  Agree with plan for echo. May need to consider repeat cath  Discussed with Dr. Copper and Kirke CorinMcDowell   C  06/26/2020, 11:20 AM

## 2020-06-26 NOTE — Progress Notes (Signed)
PHARMACY CONSULT NOTE Pharmacy Consult for tirofiban>>heparin Indication: LV thrombus  No Known Allergies  Patient Measurements: Height: 5\' 8"  (172.7 cm) Weight: 76.3 kg (168 lb 3.4 oz) IBW/kg (Calculated) : 68.4  Vital Signs: Temp: 98.2 F (36.8 C) (10/02 1122) BP: 97/69 (10/02 1412) Pulse Rate: 113 (10/02 1412)  Labs: Recent Labs    06/25/20 2323 06/25/20 2323 06/25/20 2334 06/26/20 0150 06/26/20 1352  HGB 17.3*   < >  --  17.5* 16.0  HCT 53.4*  --   --  54.5* 47.0  PLT 322  --   --  343  --   APTT  --   --  30  --   --   LABPROT  --   --  14.4  --   --   INR  --   --  1.2  --   --   CREATININE 1.02  --   --  1.00  --   TROPONINIHS >27,000*  --   --  >27,000*  --    < > = values in this interval not displayed.    Estimated Creatinine Clearance: 86.5 mL/min (by C-G formula based on SCr of 1 mg/dL).   Medical History: History reviewed. No pertinent past medical history.  Medications:  Medications Prior to Admission  Medication Sig Dispense Refill Last Dose  . Creatine 5000 MG PACK Take 5,000 mg by mouth 3 (three) times a week.     . Multiple Vitamins-Minerals (CENTRUM ULTRA MENS) TABS Take 1 tablet by mouth.     . Sulfamethoxazole-Trimethoprim (BACTRIM PO) Take 500 mg by mouth 2 (two) times daily.       Assessment: 49 y.o. M presented as STEMI - s/p angioplasty. Cardiac surgery consulted for consideration of multivessel CABG. Tirofiban started overnight. Patient became more uncomfortable this morning, concern for early cardiogenic shock. Stat echo showed LV thrombus, taken to cath lab for possible mechanical support. Good cardiac output on relook, no balloon pump placed, starting inotropes.   New orders to transition patient off tirofiban to heparin in light of LV thrombus.   Plan:  Start heparin at 1200 units/hr, swan sheath to remain in place so will start now.  Stop tirofiban once heparin started 6 hour heparin level then daily  54 PharmD.,  BCPS Clinical Pharmacist 06/26/2020 2:41 PM

## 2020-06-26 NOTE — Progress Notes (Signed)
   06/26/20 1228  Clinical Encounter Type  Visited With Other (Comment) (Spoke with nurse, Mora Bellman via phone)  Visit Type Other (Comment)  Referral From Nurse  Consult/Referral To Chaplain  Chaplain responded to page. Spoke with Mora Bellman, stated code stemi, chaplain not needed. Advised to call back if Chaplain services are needed. This note was prepared by Deneen Harts, M.Div..  For questions please contact by phone [phone].

## 2020-06-26 NOTE — Progress Notes (Signed)
   Progress Note  Patient Name: Jerry [name] Date of Encounter: 06/26/2020  Primary Cardiologist: Tonny Bollman, MD  Patient reevaluated due to worsening chest discomfort and shortness of breath.  Symptoms somewhat pleuritic in nature.  Systolic pressure running 90s to low 100s and currently he is not on nitroglycerin.  He has remained on Aggrastat infusion as before and his follow-up ECG which I personally reviewed does not show any recurring ST segment elevation.  Given late presentation infarct and also systolic dysfunction noted at cardiac catheterization, concern is for cardiogenic shock, although mechanical complication or pericarditis certainly possible.  I have reviewed the case with Dr. Excell Seltzer, stat echocardiogram is planned.  Signed, Nona Dell, MD  06/26/2020, 11:15 AM

## 2020-06-27 ENCOUNTER — Inpatient Hospital Stay (HOSPITAL_COMMUNITY): Payer: 59

## 2020-06-27 ENCOUNTER — Encounter (HOSPITAL_COMMUNITY): Payer: Self-pay

## 2020-06-27 ENCOUNTER — Encounter (HOSPITAL_COMMUNITY)
Admission: EM | Disposition: A | Discharge status: Home / Self Care | Payer: Self-pay | Source: Home / Self Care | Attending: Thoracic Surgery (Cardiothoracic Vascular Surgery)

## 2020-06-27 DIAGNOSIS — I2111 ST elevation (STEMI) myocardial infarction involving right coronary artery: Secondary | ICD-10-CM

## 2020-06-27 DIAGNOSIS — I340 Nonrheumatic mitral (valve) insufficiency: Secondary | ICD-10-CM

## 2020-06-27 DIAGNOSIS — J9601 Acute respiratory failure with hypoxia: Secondary | ICD-10-CM

## 2020-06-27 DIAGNOSIS — I5021 Acute systolic (congestive) heart failure: Secondary | ICD-10-CM

## 2020-06-27 DIAGNOSIS — J810 Acute pulmonary edema: Secondary | ICD-10-CM

## 2020-06-27 DIAGNOSIS — R570 Cardiogenic shock: Secondary | ICD-10-CM

## 2020-06-27 LAB — BASIC METABOLIC PANEL
Anion gap: 12 (ref 5–15)
Anion gap: 12 (ref 5–15)
BUN: 18 mg/dL (ref 6–20)
BUN: 21 mg/dL — ABNORMAL HIGH (ref 6–20)
CO2: 24 mmol/L (ref 22–32)
CO2: 23 mmol/L (ref 22–32)
Calcium: 8.7 mg/dL — ABNORMAL LOW (ref 8.9–10.3)
Calcium: 8.1 mg/dL — ABNORMAL LOW (ref 8.9–10.3)
Chloride: 100 mmol/L (ref 98–111)
Chloride: 100 mmol/L (ref 98–111)
Creatinine, Ser: 1.06 mg/dL (ref 0.61–1.24)
Creatinine, Ser: 1.17 mg/dL (ref 0.61–1.24)
GFR calc Af Amer: >60 mL/min (ref >60)
GFR calc Af Amer: >60 mL/min (ref >60)
GFR calc non Af Amer: >60 mL/min (ref >60)
GFR calc non Af Amer: >60 mL/min (ref >60)
Glucose, Bld: 112 mg/dL — ABNORMAL HIGH (ref 70–99)
Glucose, Bld: 149 mg/dL — ABNORMAL HIGH (ref 70–99)
Potassium: 3.5 mmol/L (ref 3.5–5.1)
Potassium: 4.2 mmol/L (ref 3.5–5.1)
Sodium: 136 mmol/L (ref 135–145)
Sodium: 135 mmol/L (ref 135–145)

## 2020-06-27 LAB — BLOOD GAS, ARTERIAL
Acid-Base Excess: 1 mmol/L (ref 0.0–2.0)
Bicarbonate: 25.2 mmol/L (ref 20.0–28.0)
Drawn by: 59506
FIO2: 70
O2 Saturation: 98.4 %
Patient temperature: 38.1
pCO2 arterial: 41.4 mmHg (ref 32.0–48.0)
pH, Arterial: 7.402 (ref 7.350–7.450)
pO2, Arterial: 124 mmHg — ABNORMAL HIGH (ref 83.0–108.0)

## 2020-06-27 LAB — CBC
HCT: 43.6 % (ref 39.0–52.0)
HCT: 42.2 % (ref 39.0–52.0)
Hemoglobin: 14.1 g/dL (ref 13.0–17.0)
Hemoglobin: 13.7 g/dL (ref 13.0–17.0)
MCH: 29.3 pg (ref 26.0–34.0)
MCH: 29.1 pg (ref 26.0–34.0)
MCHC: 32.3 g/dL (ref 30.0–36.0)
MCHC: 32.5 g/dL (ref 30.0–36.0)
MCV: 90.6 fL (ref 80.0–100.0)
MCV: 89.8 fL (ref 80.0–100.0)
Platelets: 212 10*3/uL (ref 150–400)
Platelets: 243 10*3/uL (ref 150–400)
RBC: 4.81 MIL/uL (ref 4.22–5.81)
RBC: 4.7 MIL/uL (ref 4.22–5.81)
RDW: 13.2 % (ref 11.5–15.5)
RDW: 13 % (ref 11.5–15.5)
WBC: 25.5 10*3/uL — ABNORMAL HIGH (ref 4.0–10.5)
WBC: 30.8 10*3/uL — ABNORMAL HIGH (ref 4.0–10.5)
nRBC: 0 % (ref 0.0–0.2)
nRBC: 0 % (ref 0.0–0.2)

## 2020-06-27 LAB — HEPARIN LEVEL (UNFRACTIONATED)
Heparin Unfractionated: <0.1 IU/mL — ABNORMAL LOW (ref 0.30–0.70)
Heparin Unfractionated: 0.1 IU/mL — ABNORMAL LOW (ref 0.30–0.70)
Heparin Unfractionated: 0.17 IU/mL — ABNORMAL LOW (ref 0.30–0.70)

## 2020-06-27 LAB — COOXEMETRY PANEL
Carboxyhemoglobin: 1.6 % — ABNORMAL HIGH (ref 0.5–1.5)
Carboxyhemoglobin: 1.4 % (ref 0.5–1.5)
Carboxyhemoglobin: 1.2 % (ref 0.5–1.5)
Methemoglobin: 1 % (ref 0.0–1.5)
Methemoglobin: 0.7 % (ref 0.0–1.5)
Methemoglobin: 0.9 % (ref 0.0–1.5)
O2 Saturation: 52.5 %
O2 Saturation: 45.7 %
O2 Saturation: 58.7 %
Total hemoglobin: 14.5 g/dL (ref 12.0–16.0)
Total hemoglobin: 14 g/dL (ref 12.0–16.0)
Total hemoglobin: 14.9 g/dL (ref 12.0–16.0)

## 2020-06-27 LAB — LACTIC ACID, PLASMA
Lactic Acid, Venous: 1.2 mmol/L (ref 0.5–1.9)
Lactic Acid, Venous: 1.5 mmol/L (ref 0.5–1.9)
Lactic Acid, Venous: 1.2 mmol/L (ref 0.5–1.9)
Lactic Acid, Venous: 1.2 mmol/L (ref 0.5–1.9)

## 2020-06-27 LAB — PROCALCITONIN: Procalcitonin: 1.21 ng/mL

## 2020-06-27 LAB — GLUCOSE, CAPILLARY: Glucose-Capillary: 166 mg/dL — ABNORMAL HIGH (ref 70–99)

## 2020-06-27 LAB — SEDIMENTATION RATE: Sed Rate: 20 mm/hr — ABNORMAL HIGH (ref 0–16)

## 2020-06-27 SURGERY — INVASIVE LAB ABORTED CASE

## 2020-06-27 MED ORDER — DEXMEDETOMIDINE HCL IN NACL 400 MCG/100ML IV SOLN
0.4000 ug/kg/h | INTRAVENOUS | Status: DC
  Administered 2020-06-27 – 2020-06-28 (×3): 0.4 ug/kg/h via INTRAVENOUS
  Administered 2020-06-29: 0.2 ug/kg/h via INTRAVENOUS
  Administered 2020-06-29: 0.8 ug/kg/h via INTRAVENOUS
  Administered 2020-06-29: 0.9 ug/kg/h via INTRAVENOUS
  Administered 2020-06-30: 0.4 ug/kg/h via INTRAVENOUS
  Administered 2020-06-30: 1.2 ug/kg/h via INTRAVENOUS
  Administered 2020-06-30: 0.4 ug/kg/h via INTRAVENOUS
  Administered 2020-07-01 (×2): 0.5 ug/kg/h via INTRAVENOUS
  Administered 2020-07-01 (×2): 1.2 ug/kg/h via INTRAVENOUS
  Administered 2020-07-02 (×4): 0.8 ug/kg/h via INTRAVENOUS
  Administered 2020-07-03: 0.6 ug/kg/h via INTRAVENOUS
  Administered 2020-07-03 (×2): 0.8 ug/kg/h via INTRAVENOUS
  Filled 2020-06-27 (×20): qty 100

## 2020-06-27 MED ORDER — SULFAMETHOXAZOLE-TRIMETHOPRIM 800-160 MG PO TABS
1.0000 | ORAL_TABLET | Freq: Two times a day (BID) | ORAL | Status: DC
  Administered 2020-06-27: 1 via ORAL
  Filled 2020-06-27 (×2): qty 1

## 2020-06-27 MED ORDER — DIGOXIN 125 MCG PO TABS
0.1250 mg | ORAL_TABLET | Freq: Every day | ORAL | Status: DC
  Administered 2020-06-27: 0.125 mg via ORAL
  Filled 2020-06-27: qty 1

## 2020-06-27 MED ORDER — POTASSIUM CHLORIDE CRYS ER 20 MEQ PO TBCR
40.0000 meq | EXTENDED_RELEASE_TABLET | Freq: Once | ORAL | Status: AC
  Administered 2020-06-27: 40 meq via ORAL
  Filled 2020-06-27: qty 2

## 2020-06-27 MED ORDER — MORPHINE SULFATE (PF) 4 MG/ML IV SOLN
INTRAVENOUS | Status: AC
Start: 2020-06-27 — End: 2020-06-27
  Administered 2020-06-27: 4 mg via INTRAVENOUS
  Filled 2020-06-27: qty 1

## 2020-06-27 MED ORDER — FENTANYL CITRATE (PF) 100 MCG/2ML IJ SOLN
50.0000 ug | Freq: Once | INTRAMUSCULAR | Status: AC
  Administered 2020-06-27: 50 ug via INTRAVENOUS

## 2020-06-27 MED ORDER — FUROSEMIDE 10 MG/ML IJ SOLN
60.0000 mg | Freq: Once | INTRAMUSCULAR | Status: AC
  Administered 2020-06-27: 60 mg via INTRAVENOUS
  Filled 2020-06-27: qty 6

## 2020-06-27 MED ORDER — TRAZODONE HCL 50 MG PO TABS
100.0000 mg | ORAL_TABLET | Freq: Every day | ORAL | Status: DC
  Administered 2020-06-27: 100 mg
  Filled 2020-06-27: qty 2

## 2020-06-27 MED ORDER — LIDOCAINE HCL (PF) 1 % IJ SOLN
INTRAMUSCULAR | Status: AC
  Filled 2020-06-27: qty 30

## 2020-06-27 MED ORDER — POLYETHYLENE GLYCOL 3350 17 G PO PACK
17.0000 g | PACK | Freq: Every day | ORAL | Status: DC
  Filled 2020-06-27: qty 1

## 2020-06-27 MED ORDER — ASPIRIN 325 MG PO TABS
650.0000 mg | ORAL_TABLET | Freq: Once | ORAL | Status: AC
  Administered 2020-06-27: 650 mg
  Filled 2020-06-27: qty 2

## 2020-06-27 MED ORDER — FENTANYL BOLUS VIA INFUSION
50.0000 ug | INTRAVENOUS | Status: DC | PRN
  Administered 2020-06-27 – 2020-06-29 (×4): 50 ug via INTRAVENOUS
  Filled 2020-06-27: qty 50

## 2020-06-27 MED ORDER — FUROSEMIDE 10 MG/ML IJ SOLN
40.0000 mg | Freq: Once | INTRAMUSCULAR | Status: AC
  Administered 2020-06-27: 40 mg via INTRAVENOUS
  Filled 2020-06-27: qty 4

## 2020-06-27 MED ORDER — MORPHINE SULFATE (PF) 4 MG/ML IV SOLN: 4.0000 mg | Freq: Once | INTRAVENOUS | Status: DC

## 2020-06-27 MED ORDER — MIDAZOLAM 50MG/50ML (1MG/ML) PREMIX INFUSION
0.0000 mg/h | INTRAVENOUS | Status: DC
  Administered 2020-06-28 (×2): 2 mg/h via INTRAVENOUS
  Administered 2020-06-29: 4 mg/h via INTRAVENOUS
  Administered 2020-06-30: 3 mg/h via INTRAVENOUS
  Filled 2020-06-27 (×4): qty 50

## 2020-06-27 MED ORDER — MIDAZOLAM HCL 2 MG/2ML IJ SOLN
INTRAMUSCULAR | Status: AC
  Filled 2020-06-27: qty 4

## 2020-06-27 MED ORDER — MIDAZOLAM HCL 2 MG/2ML IJ SOLN
4.0000 mg | Freq: Once | INTRAMUSCULAR | Status: AC
  Administered 2020-06-27: 4 mg via INTRAVENOUS
  Filled 2020-06-27: qty 4

## 2020-06-27 MED ORDER — VANCOMYCIN HCL 1750 MG/350ML IV SOLN
1750.0000 mg | Freq: Once | INTRAVENOUS | Status: AC
  Administered 2020-06-27: 1750 mg via INTRAVENOUS
  Filled 2020-06-27: qty 350

## 2020-06-27 MED ORDER — ADENOSINE 6 MG/2ML IV SOLN
INTRAVENOUS | Status: AC
  Administered 2020-06-27: 6 mg
  Filled 2020-06-27: qty 4

## 2020-06-27 MED ORDER — DOCUSATE SODIUM 50 MG/5ML PO LIQD: 100.0000 mg | Freq: Two times a day (BID) | ORAL | Status: DC

## 2020-06-27 MED ORDER — HEPARIN BOLUS VIA INFUSION
2000.0000 [IU] | Freq: Once | INTRAVENOUS | Status: AC
  Administered 2020-06-27: 2000 [IU] via INTRAVENOUS
  Filled 2020-06-27: qty 2000

## 2020-06-27 MED ORDER — FENTANYL CITRATE (PF) 100 MCG/2ML IJ SOLN
INTRAMUSCULAR | Status: AC
  Administered 2020-06-27: 200 ug
  Filled 2020-06-27: qty 4

## 2020-06-27 MED ORDER — HEPARIN (PORCINE) IN NACL 1000-0.9 UT/500ML-% IV SOLN
INTRAVENOUS | Status: AC
  Filled 2020-06-27: qty 1000

## 2020-06-27 MED ORDER — POLYETHYLENE GLYCOL 3350 17 G PO PACK
17.0000 g | PACK | Freq: Every day | ORAL | Status: DC
  Administered 2020-06-28 – 2020-06-30 (×3): 17 g
  Filled 2020-06-27 (×3): qty 1

## 2020-06-27 MED ORDER — MIDAZOLAM HCL 2 MG/2ML IJ SOLN
2.0000 mg | INTRAMUSCULAR | Status: DC | PRN
  Administered 2020-06-27: 2 mg via INTRAVENOUS
  Filled 2020-06-27: qty 2

## 2020-06-27 MED ORDER — NOREPINEPHRINE 4 MG/250ML-% IV SOLN
0.0000 ug/min | INTRAVENOUS | Status: DC
  Administered 2020-06-27: 16 ug/min via INTRAVENOUS
  Administered 2020-06-27: 3 ug/min via INTRAVENOUS
  Filled 2020-06-27 (×2): qty 250

## 2020-06-27 MED ORDER — FENTANYL 2500MCG IN NS 250ML (10MCG/ML) PREMIX INFUSION
50.0000 ug/h | INTRAVENOUS | Status: DC
  Administered 2020-06-27: 200 ug/h via INTRAVENOUS
  Administered 2020-06-28 – 2020-06-30 (×9): 400 ug/h via INTRAVENOUS
  Filled 2020-06-27 (×10): qty 250

## 2020-06-27 MED ORDER — DOCUSATE SODIUM 50 MG/5ML PO LIQD
100.0000 mg | Freq: Two times a day (BID) | ORAL | Status: DC
  Administered 2020-06-27 – 2020-06-30 (×7): 100 mg
  Filled 2020-06-27 (×7): qty 10

## 2020-06-27 MED ORDER — LIDOCAINE HCL (PF) 1 % IJ SOLN
INTRAMUSCULAR | Status: AC
  Filled 2020-06-27: qty 5

## 2020-06-27 MED ORDER — PANTOPRAZOLE SODIUM 40 MG IV SOLR
40.0000 mg | INTRAVENOUS | Status: DC
  Administered 2020-06-27 – 2020-06-30 (×4): 40 mg via INTRAVENOUS
  Filled 2020-06-27 (×4): qty 40

## 2020-06-27 MED ORDER — ATORVASTATIN CALCIUM 80 MG PO TABS
80.0000 mg | ORAL_TABLET | Freq: Every day | ORAL | Status: DC
  Administered 2020-06-28 – 2020-06-30 (×3): 80 mg
  Filled 2020-06-27 (×3): qty 1

## 2020-06-27 MED ORDER — ETOMIDATE 2 MG/ML IV SOLN
10.0000 mg | Freq: Once | INTRAVENOUS | Status: AC
  Administered 2020-06-27: 10 mg via INTRAVENOUS
  Filled 2020-06-27: qty 5

## 2020-06-27 MED ORDER — ASPIRIN 325 MG PO TABS: 325.0000 mg | ORAL_TABLET | Freq: Two times a day (BID) | ORAL | Status: DC

## 2020-06-27 MED ORDER — MIDAZOLAM BOLUS VIA INFUSION
1.0000 mg | INTRAVENOUS | Status: DC | PRN
  Filled 2020-06-27: qty 2

## 2020-06-27 MED ORDER — NOREPINEPHRINE 16 MG/250ML-% IV SOLN
0.0000 ug/min | INTRAVENOUS | Status: DC
  Administered 2020-06-28: 16 ug/min via INTRAVENOUS
  Administered 2020-06-28: 14 ug/min via INTRAVENOUS
  Administered 2020-06-29: 10 ug/min via INTRAVENOUS
  Filled 2020-06-27 (×3): qty 250

## 2020-06-27 MED ORDER — SUCCINYLCHOLINE CHLORIDE 20 MG/ML IJ SOLN
1.5000 mg/kg | Freq: Once | INTRAMUSCULAR | Status: AC
  Administered 2020-06-27: 114.4 mg via INTRAVENOUS
  Filled 2020-06-27: qty 5.72

## 2020-06-27 MED ORDER — FENTANYL CITRATE (PF) 100 MCG/2ML IJ SOLN
INTRAMUSCULAR | Status: AC
Start: 2020-06-27 — End: 2020-06-27
  Administered 2020-06-27: 200 ug
  Filled 2020-06-27: qty 4

## 2020-06-27 MED ORDER — HEPARIN (PORCINE) IN NACL 1000-0.9 UT/500ML-% IV SOLN
INTRAVENOUS | Status: DC | PRN
  Administered 2020-06-27 (×2): 500 mL

## 2020-06-27 MED ORDER — MORPHINE SULFATE (PF) 4 MG/ML IV SOLN
4.0000 mg | INTRAVENOUS | Status: DC | PRN
  Administered 2020-06-27 (×6): 4 mg via INTRAVENOUS
  Filled 2020-06-27 (×6): qty 1

## 2020-06-27 MED ORDER — ACETAMINOPHEN 325 MG PO TABS: 650.0000 mg | ORAL_TABLET | Freq: Four times a day (QID) | ORAL | Status: DC | PRN

## 2020-06-27 MED ORDER — POLYETHYLENE GLYCOL 3350 17 G PO PACK: 17.0000 g | PACK | Freq: Every day | ORAL | Status: DC

## 2020-06-27 MED ORDER — COLCHICINE 0.6 MG PO TABS
0.6000 mg | ORAL_TABLET | Freq: Two times a day (BID) | ORAL | Status: DC
  Administered 2020-06-27: 0.6 mg via ORAL
  Filled 2020-06-27: qty 1

## 2020-06-27 MED ORDER — FENTANYL CITRATE (PF) 100 MCG/2ML IJ SOLN
200.0000 ug | Freq: Once | INTRAMUSCULAR | Status: AC
  Administered 2020-06-27: 200 ug via INTRAVENOUS
  Filled 2020-06-27: qty 4

## 2020-06-27 MED ORDER — PERFLUTREN LIPID MICROSPHERE
1.0000 mL | INTRAVENOUS | Status: AC | PRN
  Administered 2020-06-27: 3 mL via INTRAVENOUS
  Filled 2020-06-27: qty 10

## 2020-06-27 MED ORDER — SODIUM CHLORIDE 0.9 % IV SOLN
2.0000 g | Freq: Three times a day (TID) | INTRAVENOUS | Status: AC
  Administered 2020-06-27 – 2020-07-03 (×20): 2 g via INTRAVENOUS
  Filled 2020-06-27 (×21): qty 2

## 2020-06-27 MED ORDER — TRAZODONE HCL 50 MG PO TABS: 100.0000 mg | ORAL_TABLET | Freq: Every day | ORAL | Status: DC

## 2020-06-27 MED ORDER — POTASSIUM CHLORIDE CRYS ER 20 MEQ PO TBCR
40.0000 meq | EXTENDED_RELEASE_TABLET | Freq: Once | ORAL | Status: AC
  Administered 2020-06-27: 40 meq via ORAL

## 2020-06-27 MED ORDER — MIDAZOLAM HCL 2 MG/2ML IJ SOLN
INTRAMUSCULAR | Status: AC
  Administered 2020-06-27: 2 mg via INTRAVENOUS
  Filled 2020-06-27: qty 4

## 2020-06-27 MED ORDER — DIGOXIN 125 MCG PO TABS
0.1250 mg | ORAL_TABLET | Freq: Every day | ORAL | Status: DC
  Administered 2020-06-28 – 2020-06-30 (×3): 0.125 mg
  Filled 2020-06-27 (×3): qty 1

## 2020-06-27 MED ORDER — ASPIRIN 325 MG PO TABS
325.0000 mg | ORAL_TABLET | Freq: Two times a day (BID) | ORAL | Status: DC
  Administered 2020-06-28 – 2020-07-01 (×7): 325 mg
  Filled 2020-06-27 (×7): qty 1

## 2020-06-27 MED ORDER — ASPIRIN 325 MG PO TABS: 325.0000 mg | ORAL_TABLET | Freq: Every day | ORAL | Status: DC

## 2020-06-27 MED ORDER — DOCUSATE SODIUM 50 MG/5ML PO LIQD
100.0000 mg | Freq: Two times a day (BID) | ORAL | Status: DC
  Filled 2020-06-27: qty 10

## 2020-06-27 MED ORDER — ASPIRIN 325 MG PO TABS
650.0000 mg | ORAL_TABLET | Freq: Once | ORAL | Status: AC
  Administered 2020-06-27: 650 mg via ORAL
  Filled 2020-06-27: qty 2

## 2020-06-27 MED ORDER — ADENOSINE 6 MG/2ML IV SOLN
INTRAVENOUS | Status: AC
  Administered 2020-06-27: 12 mg
  Filled 2020-06-27: qty 4

## 2020-06-27 MED ORDER — SPIRONOLACTONE 12.5 MG HALF TABLET: 12.5000 mg | ORAL_TABLET | Freq: Every day | ORAL | Status: DC

## 2020-06-27 MED ORDER — VANCOMYCIN HCL IN DEXTROSE 1-5 GM/200ML-% IV SOLN
1000.0000 mg | Freq: Two times a day (BID) | INTRAVENOUS | Status: DC
  Administered 2020-06-28 – 2020-06-30 (×5): 1000 mg via INTRAVENOUS
  Filled 2020-06-27 (×5): qty 200

## 2020-06-27 MED ORDER — FUROSEMIDE 10 MG/ML IJ SOLN
40.0000 mg | Freq: Two times a day (BID) | INTRAMUSCULAR | Status: DC
  Administered 2020-06-27: 40 mg via INTRAVENOUS
  Filled 2020-06-27: qty 4

## 2020-06-27 MED ORDER — MORPHINE SULFATE (PF) 4 MG/ML IV SOLN
4.0000 mg | Freq: Once | INTRAVENOUS | Status: AC
  Administered 2020-06-27: 4 mg via INTRAVENOUS
  Filled 2020-06-27: qty 1

## 2020-06-27 MED ORDER — COLCHICINE 0.6 MG PO TABS
0.6000 mg | ORAL_TABLET | Freq: Two times a day (BID) | ORAL | Status: DC
  Administered 2020-06-27 – 2020-06-30 (×7): 0.6 mg
  Filled 2020-06-27 (×7): qty 1

## 2020-06-27 MED ORDER — MIDAZOLAM HCL 2 MG/2ML IJ SOLN
2.0000 mg | INTRAMUSCULAR | Status: AC | PRN
  Administered 2020-06-27: 2 mg via INTRAVENOUS
  Filled 2020-06-27: qty 2

## 2020-06-27 MED FILL — Nitroglycerin IV Soln 100 MCG/ML in D5W: INTRA_ARTERIAL | Qty: 10 | Status: AC

## 2020-06-27 SURGICAL SUPPLY — 5 items
KIT HEART LEFT (KITS) ×1 IMPLANT
PACK CARDIAC CATHETERIZATION (CUSTOM PROCEDURE TRAY) ×1 IMPLANT
SYR MEDRAD MARK 7 150ML (SYRINGE) ×1 IMPLANT
TRANSDUCER W/STOPCOCK (MISCELLANEOUS) ×1 IMPLANT
TUBING CIL FLEX 10 FLL-RA (TUBING) ×1 IMPLANT

## 2020-06-27 NOTE — Progress Notes (Addendum)
eLink Physician-Brief Progress Note Patient Name: Jerry [name] DOB: 04/19/1971 MRN: [id number]   Date of Service  06/27/2020  HPI/Events of Note  Cardiogenic shock - recently intubated and on precedex and fentanyl infusion. Precedex not being titrated per cardiology so request for versed infusion in addition to help with sedation.   eICU Interventions  Orders placed and should start once fentanyl infusion is at max rate Post intubation CXR, ABG and labs are pending, please call when results      Intervention Category Major Interventions: Delirium, psychosis, severe agitation - evaluation and management   G  06/27/2020, 8:32 PM   9:30 pm Reviewed post intubation cxr, radiology note reviewed - ETT to be  Pulled back by 1 cm and repeat CXR after that CBC and BMP are acceptable ABG is pending Please ;let us know when those are available   In addition, wife wanted to speak over camera. Discussed events with her, answered her general questions and told her we would be available if other questions came up. She just wanted a general update.   9:45 pm Restraints ordered for this intubated patient - 24 hours, non violent, to prevent dislodgement of ETT, seen on camera OG tube to LIWS - order placed Noted ABG, continue current settings and wean fio2 as tolerated Repeat CXR is pending, plz inform when done

## 2020-06-27 NOTE — Procedures (Signed)
Intubation Procedure Note  Jerry [name]  [id number]  06-Aug-1971  Date:06/27/20  Time:7:33 PM   Provider Performing: P Chestine Spore    Procedure: Intubation (31500)  Indication(s) Respiratory Failure  Consent Risks of the procedure as well as the alternatives and risks of each were explained to the patient and/or caregiver.  Consent for the procedure was obtained and is signed in the bedside chart   Anesthesia Etomidate, Versed, Fentanyl and Succinylcholine   Time Out Verified patient identification, verified procedure, site/side was marked, verified correct patient position, special equipment/implants available, medications/allergies/relevant history reviewed, required imaging and test results available.   Sterile Technique Usual hand hygeine, masks, and gloves were used   Procedure Description Patient positioned in bed supine.  Sedation given as noted above.  Patient was intubated with endotracheal tube using Glidescope S4.  View was Grade 1 full glottis .  Number of attempts was 1.  Colorimetric CO2 detector was consistent with tracheal placement.   Complications/Tolerance None; patient tolerated the procedure well. Chest X-ray is ordered to verify placement.   EBL 0   Specimen(s) None  Steffanie Dunn, DO 06/27/20 7:33 PM [facility]

## 2020-06-27 NOTE — Progress Notes (Addendum)
Patient ID: Jerry [name], male   DOB: 10-Feb-1971, 49 y.o.   MRN: [id number]    Advanced Heart Failure Team Consult Note   Primary Physician: Kathyrn Lass, MD PCP-Cardiologist:  Sherren Mocha, MD  Reason for Consultation: CHF post-MI  HPI:    Jerry [name] is seen today for evaluation of CHF post-MI at the request of Dr. Burt Knack.   49 y.o. with minimal PMH and in generally good health presented with acute inferolateral MI. He has 2 young children, operates heavy machinery, and prior to recent back surgery, he would run 2 miles at a time.  No smoking, rare ETOH, no drugs.  No family history of CAD.  He had a micro-discectomy last week without complication.  On Friday morning, he developed chest pain.  He tried to self-medicate with TUMS, etc, but pain continued so he came to ER, where he was noted to have evidence for acute inferior and anterolateral MI.  He was taken to cath lab, LHC showed thrombotic occlusion of distal RCA that was treated with PTCA and thrombectomy.  He was also noted to have chronic total occlusion of the LAD with collaterals from the right (thus this territory was affected by the RCA MI) as well as 90% complex proximal LCx stenosis.  He was seen by TCTS for yesterday for consideration for eventual CABG.  Echo showed EF 25-30%, apical thrombus, normal RV.  Aggrastat was stopped and heparin gtt begun.   Yesterday, concern for tachycardia and increased dyspnea, went to cath lab where Luiz Blare was placed.  CI 2.8 by thermodilution and 1.6 by Fick.  Elevated PCWP at 26 with normal RA pressure at 7.  Given good thermodilution CI, it was decided not to place IABP (would also have to lie flat after recent back surgery) and milrinone 0.25 was begun.  He also has developed pleuritic chest pain consistent with post-MI pericarditis.   This morning, Swan numbers as below.  He is tachycardic, main complaint is pleuritic chest pain.  Especially bad when he coughs.  Not short of breath but cannot  take a deep breath.  Of note, febrile to 101 overnight with WBCs up to 26.   Swan numbers today:  RA 8 PA 53/33 Unable to wedge CI 2.5 Co-ox 52%  Review of Systems:  All systems reviewed and negative except as per HPI.  Home Medications Prior to Admission medications   Medication Sig Start Date End Date Taking? Authorizing Provider  Creatine 5000 MG PACK Take 5,000 mg by mouth 3 (three) times a week.    [provider]  Multiple Vitamins-Minerals (CENTRUM ULTRA MENS) TABS Take 1 tablet by mouth.    [provider]  Sulfamethoxazole-Trimethoprim (BACTRIM PO) Take 500 mg by mouth 2 (two) times daily.    [provider]    Past Medical History: - lumbar disc disease  Past Surgical History: Past Surgical History:  Procedure Laterality Date  . right clavicle fracture reapir  2010    Family History: History reviewed. No pertinent family history.  Social History: Social History   Socioeconomic History  . Marital status: Single    Spouse name: Not on file  . Number of children: Not on file  . Years of education: Not on file  . Highest education level: Not on file  Occupational History  . Not on file  Tobacco Use  . Smoking status: Never Smoker  Substance and Sexual Activity  . Alcohol use: Yes    Alcohol/week: 1.0 standard drink  Types: 1 drink(s) per week  . Drug use: No  . Sexual activity: Not on file  Other Topics Concern  . Not on file  Social History Narrative  . Not on file   Social Determinants of Health   Financial Resource Strain:   . Difficulty of Paying Living Expenses: Not on file  Food Insecurity:   . Worried About Charity fundraiser in the Last Year: Not on file  . Ran Out of Food in the Last Year: Not on file  Transportation Needs:   . Lack of Transportation (Medical): Not on file  . Lack of Transportation (Non-Medical): Not on file  Physical Activity:   . Days of Exercise per Week: Not on file  . Minutes of  Exercise per Session: Not on file  Stress:   . Feeling of Stress : Not on file  Social Connections:   . Frequency of Communication with Friends and Family: Not on file  . Frequency of Social Gatherings with Friends and Family: Not on file  . Attends Religious Services: Not on file  . Active Member of Clubs or Organizations: Not on file  . Attends Archivist Meetings: Not on file  . Marital Status: Not on file    Allergies:  No Known Allergies  Objective:    Vital Signs:   Temp:  [98.2 F (36.8 C)-102.4 F (39.1 C)] 100.2 F (37.9 C) (10/03 0500) Pulse Rate:  [102-124] 108 (10/03 0500) Resp:  [15-38] 28 (10/03 0500) BP: (82-107)/(59-82) 94/68 (10/03 0500) SpO2:  [0 %-100 %] 96 % (10/03 0500) Weight:  [76.2 kg] 76.2 kg (10/03 0500) Last BM Date: 06/23/20  Weight change: Filed Weights   06/25/20 2347 06/26/20 0115 06/27/20 0500  Weight: 80.7 kg 76.3 kg 76.2 kg    Intake/Output:   Intake/Output Summary (Last 24 hours) at 06/27/2020 0826 Last data filed at 06/27/2020 0700 Gross per 24 hour  Intake 652.44 ml  Output 2025 ml  Net -1372.56 ml      Physical Exam    General:  Well appearing. No resp difficulty HEENT: normal Neck: supple. JVP not elevated. Carotids 2+ bilat; no bruits. No lymphadenopathy or thyromegaly appreciated. Cor: PMI nondisplaced. Tachy, regular rate & rhythm. There is a soft friction rub noted.  Lungs: clear Abdomen: soft, nontender, nondistended. No hepatosplenomegaly. No bruits or masses. Good bowel sounds. Extremities: no cyanosis, clubbing, rash, edema Neuro: alert & orientedx3, cranial nerves grossly intact. moves all 4 extremities w/o difficulty. Affect pleasant Skin: Surgical site on back appears to have opened slightly.    Telemetry   Sinus tachy around 120  EKG    NSR, acute inferior and anterolateral MI  Labs   Basic Metabolic Panel: Recent Labs  Lab 06/25/20 2323 06/26/20 0150 06/26/20 1352 06/27/20 0226  NA  140 140 141 136  K 4.0 4.1 3.6 3.5  CL 102 103  --  100  CO2 23 22  --  24  GLUCOSE 138* 142*  --  112*  BUN 14 14  --  18  CREATININE 1.02 1.00  --  1.06  CALCIUM 9.6 9.2  --  8.7*    Liver Function Tests: No results for input(s): AST, ALT, ALKPHOS, BILITOT, PROT, ALBUMIN in the last 168 hours. No results for input(s): LIPASE, AMYLASE in the last 168 hours. No results for input(s): AMMONIA in the last 168 hours.  CBC: Recent Labs  Lab 06/25/20 2323 06/26/20 0150 06/26/20 1352 06/27/20 0226  WBC 29.5* 32.0*  --  25.5*  HGB 17.3* 17.5* 16.0 14.1  HCT 53.4* 54.5* 47.0 43.6  MCV 91.1 92.5  --  90.6  PLT 322 343  --  212    Cardiac Enzymes: No results for input(s): CKTOTAL, CKMB, CKMBINDEX, TROPONINI in the last 168 hours.  BNP: BNP (last 3 results) Recent Labs    06/25/20 15-Nov-2332 06/26/20 0150  BNP 349.1* 368.4*    ProBNP (last 3 results) No results for input(s): PROBNP in the last 8760 hours.   CBG: No results for input(s): GLUCAP in the last 168 hours.  Coagulation Studies: Recent Labs    06/25/20 11/15/32  LABPROT 14.4  INR 1.2     Imaging   CARDIAC CATHETERIZATION  Result Date: 06/26/2020 Hemodynamic data: RA: Mean 7 RV: 42/7 PA: 42/24 mean 33 Pulmonary capillary wedge pressure: A-wave 27, V wave 24, mean 26 Pulmonary artery oxygen saturation 59% Aortic oxygen saturation 94% (6 L O2) Fick cardiac output 3.0 L/min, cardiac index 1.6 Thermodilution cardiac output 5.4 L/min, cardiac index 2.8 Plan: Start IV milrinone, diurese as tolerated, advanced heart failure team to see.  Discussed case with Dr. Aundra Dubin.  We will transition IV Aggrastat to IV heparin in the setting of recently diagnosed LV apical thrombus seen on echocardiogram this morning.  ECHOCARDIOGRAM COMPLETE  Result Date: 06/26/2020    ECHOCARDIOGRAM REPORT   Patient Name:   ORVAN PAPADAKIS Date of Exam: 06/26/2020 Medical Rec #:  [id number]     Height:       68.0 in Accession #:    [id number]    Weight:        168.2 lb Date of Birth:  [birth year]     BSA:          1.899 m Patient Age:    53 years      BP:           97/71 mmHg Patient Gender: M             HR:           103 bpm. Exam Location:  Inpatient Procedure: 2D Echo, Cardiac Doppler, Color Doppler and Intracardiac            Opacification Agent                         STAT ECHO Reported to: Dr Rozann Lesches on 06/26/2020 12:30:00 PM. Indications:    Chest pain  History:        Patient has no prior history of Echocardiogram examinations.                 Acute MI; Signs/Symptoms:Chest Pain.  Sonographer:    Clayton Lefort RDCS (AE) Referring Phys: [place]  1. Akinesis of the inferior, inferolateral and apical walls; overall severe LV dysfunction; apical thrombus noted; mild MR.  2. Left ventricular ejection fraction, by estimation, is 25 to 30%. The left ventricle has severely decreased function. The left ventricle has no regional wall motion abnormalities. There is mild left ventricular hypertrophy. Left ventricular diastolic parameters are indeterminate.  3. Right ventricular systolic function is normal. The right ventricular size is normal.  4. The mitral valve is normal in structure. Trivial mitral valve regurgitation. No evidence of mitral stenosis.  5. The aortic valve is tricuspid. Aortic valve regurgitation is not visualized. Mild aortic valve sclerosis is present, with no evidence of aortic valve stenosis.  6. The inferior vena cava is normal in size with  greater than 50% respiratory variability, suggesting right atrial pressure of 3 mmHg. FINDINGS  Left Ventricle: Left ventricular ejection fraction, by estimation, is 25 to 30%. The left ventricle has severely decreased function. The left ventricle has no regional wall motion abnormalities. Definity contrast agent was given IV to delineate the left  ventricular endocardial borders. The left ventricular internal cavity size was normal in size. There is mild left ventricular  hypertrophy. Left ventricular diastolic parameters are indeterminate. Right Ventricle: The right ventricular size is normal. Right ventricular systolic function is normal. Left Atrium: Left atrial size was normal in size. Right Atrium: Right atrial size was normal in size. Pericardium: There is no evidence of pericardial effusion. Mitral Valve: The mitral valve is normal in structure. Trivial mitral valve regurgitation. No evidence of mitral valve stenosis. Tricuspid Valve: The tricuspid valve is normal in structure. Tricuspid valve regurgitation is trivial. No evidence of tricuspid stenosis. Aortic Valve: The aortic valve is tricuspid. Aortic valve regurgitation is not visualized. Mild aortic valve sclerosis is present, with no evidence of aortic valve stenosis. Aortic valve mean gradient measures 2.0 mmHg. Aortic valve peak gradient measures 3.1 mmHg. Aortic valve area, by VTI measures 2.80 cm. Pulmonic Valve: The pulmonic valve was not well visualized. Pulmonic valve regurgitation is not visualized. No evidence of pulmonic stenosis. Aorta: The aortic root is normal in size and structure. Venous: The inferior vena cava is normal in size with greater than 50% respiratory variability, suggesting right atrial pressure of 3 mmHg.  Additional Comments: Akinesis of the inferior, inferolateral and apical walls; overall severe LV dysfunction; apical thrombus noted; mild MR.  LEFT VENTRICLE PLAX 2D LVIDd:         4.30 cm  Diastology LVIDs:         3.70 cm  LV e' medial:  5.00 cm/s LV PW:         1.10 cm  LV e' lateral: 3.37 cm/s LV IVS:        1.50 cm LVOT diam:     2.00 cm LV SV:         36 LV SV Index:   19 LVOT Area:     3.14 cm  RIGHT VENTRICLE             IVC RV Basal diam:  3.00 cm     IVC diam: 1.60 cm RV S prime:     12.10 cm/s TAPSE (M-mode): 2.2 cm LEFT ATRIUM             Index       RIGHT ATRIUM           Index LA diam:        3.50 cm 1.84 cm/m  RA Area:     12.90 cm LA Vol (A2C):   46.2 ml 24.33 ml/m RA  Volume:   30.00 ml  15.80 ml/m LA Vol (A4C):   51.7 ml 27.23 ml/m LA Biplane Vol: 50.3 ml 26.49 ml/m  AORTIC VALVE AV Area (Vmax):    2.70 cm AV Area (Vmean):   2.62 cm AV Area (VTI):     2.80 cm AV Vmax:           87.40 cm/s AV Vmean:          60.000 cm/s AV VTI:            0.130 m AV Peak Grad:      3.1 mmHg AV Mean Grad:      2.0 mmHg LVOT Vmax:  75.10 cm/s LVOT Vmean:        50.100 cm/s LVOT VTI:          0.116 m LVOT/AV VTI ratio: 0.89  AORTA Ao Root diam: 3.50 cm  SHUNTS Systemic VTI:  0.12 m Systemic Diam: 2.00 cm Kirk Ruths MD Electronically signed by Kirk Ruths MD Signature Date/Time: 06/26/2020/12:40:19 PM    Final       Medications:     Current Medications: . [START ON 06/28/2020] aspirin  325 mg Oral Daily  . aspirin  650 mg Oral Once  . atorvastatin  80 mg Oral Daily  . Chlorhexidine Gluconate Cloth  6 each Topical Daily  . colchicine  0.6 mg Oral BID  . digoxin  0.125 mg Oral Daily  . sodium chloride flush  3 mL Intravenous Q12H  . spironolactone  12.5 mg Oral Daily     Infusions: . sodium chloride 10 mL/hr at 06/27/20 0500  . sodium chloride 10 mL/hr at 06/26/20 0700  . heparin 1,400 Units/hr (06/27/20 0526)  . milrinone 0.25 mcg/kg/min (06/27/20 0500)       Assessment/Plan   1. CAD: Late presentation inferior MI.  Cath showed thrombotic occlusion distal RCA, CTO LAD with collaterals from right, and complex 90% proximal LCx stenosis.  He had PTCA/thrombectomy RCA with good flow at end of procedure.  Suspect he had damage to LAD territory as well as RCA territory as RCA collateralized the LAD.  Currently, he has persistent STE on ECG and has prominent pleuritic chest pain as well as a soft friction rub. Suspect post-MI pericarditis. Pain likely plays a major role in his tachycardia.   - Will repeat echo probably tomorrow for pericardial effusion (had echo yesterday).  - Colchicine 0.6 bid for pericarditis.  Will give ASA 650 mg this morning, 325 mg  daily starting tomorrow.  - Send ESR.  - Continue high dose statin.  - Eventually, would ideally have CABG but question anterior viability (had LV thrombus at presentation).  Has been seen by TCTS.  Will be high risk in setting of depressed EF and marginal cardiac output.  Needs stabilization first. Placement of Impella to support cardiac surgery will not be an option with LV thrombus.  Will consider MRI for viability once more stable and can lie flat, if he has a lot of nonviable territory, may not be CABG candidate.  2. Acute systolic CHF: Echo with EF 25-30%, RV ok (no evidence for RV infarct), has LV thrombus.  Swan in place, adequate CO by thermodilution on milrinone 0.25, but early am co-ox was low at 52%.  Normal RA pressure, PA pressure still elevated (unable to wedge). More bothered by pleuritic chest pain than dyspnea.  - Lasix 40 mg IV bid x 2 doses.  - CXR - Continue milrinone 0.25, resend co-ox.  - Add digoxin 0.125 daily.  - To get him through eventual CABG, think he would need mechanical support.  However, Impella not option with LV thrombus (can see if this resolves when cardiac MRI is done).  IABP not ideal with recent back surgery (pain with lying flat continuously).  ?axillary IABP. As above, will probably aim for cardiac MRI viability study when he can lie flat => if he does not have extensive viability, may have to start considering whether we need transplantation/LVAD support.   3. LV thrombus: On IV heparin gtt.  4. ID: Febrile to 101 overnight, WBCs to 26.  This may be effect of acute MI with post-infarct pericarditis.  -  Send blood cultures.  - Send procalcitonin.  - CXR.  5. S/p back surgery: Micro-discectomy last week.  Per nursing, the wound appears to have opened some compared to initial arrival.  Not draining, no redness.  - Neurosurgery ok'd anticoagulation at time of admission.  - Will contact neurosurgery, ask them for wound check.   Length of Stay: 2  Loralie Champagne, MD  06/27/2020, 8:26 AM  Advanced Heart Failure Team Pager [phone] (M-F; 7a - 4p)  Please contact [facility] for night-coverage after hours (4p -7a ) and weekends on [email or web address]

## 2020-06-27 NOTE — Progress Notes (Signed)
Assisted tele-visit via elink

## 2020-06-27 NOTE — Progress Notes (Signed)
[place] for tirofiban>>heparin Indication: LV thrombus  No Known Allergies  Patient Measurements: Height: _0  (172.7 cm) Weight: 76.2 kg (167 lb 15.9 oz) IBW/kg (Calculated) : 68.4  Vital Signs: Temp: 101.7 F (38.7 C) (10/03 1815) BP: 102/72 (10/03 1815) Pulse Rate: 158 (10/03 1900)  Labs: Recent Labs    06/25/20 2323 06/25/20 2323 06/25/20 2334 06/26/20 0150 06/26/20 0150 06/26/20 1352 06/26/20 1352 06/27/20 0226 06/27/20 1106 06/27/20 2011 06/27/20 2119  HGB 17.3*   < >  --  17.5*   < > 16.0   < > 14.1  --  13.7  --   HCT 53.4*   < >  --  54.5*   < > 47.0  --  43.6  --  42.2  --   PLT 322   < >  --  343  --   --   --  212  --  243  --   APTT  --   --  30  --   --   --   --   --   --   --   --   LABPROT  --   --  14.4  --   --   --   --   --   --   --   --   INR  --   --  1.2  --   --   --   --   --   --   --   --   HEPARINUNFRC  --   --   --   --   --   --   --  <0.10* 0.10*  --  0.17*  CREATININE 1.02   < >  --  1.00  --   --   --  1.06  --  1.17  --   TROPONINIHS >27,000*  --   --  >27,000*  --   --   --   --   --   --   --    < > = values in this interval not displayed.    Estimated Creatinine Clearance: 73.9 mL/min (by C-G formula based on SCr of 1.17 mg/dL).   Medical History: History reviewed. No pertinent past medical history.  Medications:  Medications Prior to Admission  Medication Sig Dispense Refill Last Dose  . ALPRAZolam (XANAX) 0.5 MG tablet Take 0.5 mg by mouth at bedtime as needed for sleep.    06/24/2020  . Multiple Vitamins-Minerals (CENTRUM ULTRA MENS) TABS Take 1 tablet by mouth.   06/24/2020  . NARCAN 4 MG/0.1ML LIQD nasal spray kit Place 1 spray into the nose once.    unknown  . traZODone (DESYREL) 100 MG tablet Take 100 mg by mouth at bedtime.   06/24/2020    Assessment: 49 y.o. M presented as STEMI - s/p angioplasty. Cardiac surgery consulted for consideration of multivessel CABG. Tirofiban started on  admission. Patient became more uncomfortable this morning, concern for early cardiogenic shock. Stat echo showed LV thrombus, taken to cath lab for possible mechanical support. Good cardiac output on relook, no balloon pump placed, starting inotropes.   Heparin level remains subtherapeutic s/p rate increase to 1650 units/hr, now intubated, no bleeding observed.    Plan:  Heparin 2000 units IV x 1, and increase heparin gtt to 1800 units/hr F/u 6 hour heparin level  Bertis Ruddy, PharmD Clinical Pharmacist Please check AMION for all [place] numbers 06/27/2020 9:40 PM

## 2020-06-27 NOTE — Progress Notes (Signed)
  Echocardiogram 2D Echocardiogram has been performed.  Jerry [name]  06/27/2020, 6:48 PM   STAT, Dr. Shirlee Latch present for exam.

## 2020-06-27 NOTE — Procedures (Signed)
Arterial Catheter Insertion Procedure Note  Jerry [name]  [id number]  [birth year]  Date:06/27/20  Time:7:35 PM    Provider Performing: Steffanie Dunn    Procedure: Insertion of Arterial Line (11914) with US guidance (78295)   Indication(s) Blood pressure monitoring and/or need for frequent ABGs  Consent Unable to obtain consent due to emergent nature of procedure.  Anesthesia None   Time Out Verified patient identification, verified procedure, site/side was marked, verified correct patient position, special equipment/implants available, medications/allergies/relevant history reviewed, required imaging and test results available.   Sterile Technique Maximal sterile technique including full sterile barrier drape, hand hygiene, sterile gown, sterile gloves, mask, hair covering, sterile ultrasound probe cover (if used).   Procedure Description Area of catheter insertion was cleaned with chlorhexidine and draped in sterile fashion. With real-time ultrasound guidance an arterial catheter was placed into the left radial artery.  Appropriate arterial tracings confirmed on monitor.     Complications/Tolerance None; patient tolerated the procedure well.   EBL Minimal   Specimen(s) None  Steffanie Dunn, DO 06/27/20 7:36 PM [facility]

## 2020-06-27 NOTE — Progress Notes (Signed)
Pharmacy Antibiotic Note  Jerry [name] is a 49 y.o. male admitted on 06/25/2020 with chest pain/code stemi. Patient well known to pharmacy service for anticoagulation dosing. Recent microdiscectomy last week, incision getting larger per nursing, fevers overnight with wbc of 25. Neurosurgery ordered bactrim for prophylaxis, now broadening to vancomycin and cefepime.   Renal function within normal limits, diuresing with IV lasix. Blood cultures no growth from this morning.   Plan: Vancomycin 1750 x1 now then IV every 1g q12 hours.  Goal trough 15-20 mcg/mL. Cefepime 2g q8 hours  Height: 5\' 8"  (172.7 cm) Weight: 76.2 kg (167 lb 15.9 oz) IBW/kg (Calculated) : 68.4  Temp (24hrs), Avg:101.3 F (38.5 C), Min:100.2 F (37.9 C), Max:102.4 F (39.1 C)  Recent Labs  Lab 06/25/20 2323 06/26/20 0150 06/27/20 0226 06/27/20 1134  WBC 29.5* 32.0* 25.5*  --   CREATININE 1.02 1.00 1.06  --   LATICACIDVEN  --   --   --  1.2    Estimated Creatinine Clearance: 81.6 mL/min (by C-G formula based on SCr of 1.06 mg/dL).    No Known Allergies   Thank you for allowing pharmacy to be a part of this patient's care.  08/27/20 PharmD., BCPS Clinical Pharmacist 06/27/2020 3:01 PM

## 2020-06-27 NOTE — Progress Notes (Signed)
PHARMACY CONSULT NOTE Pharmacy Consult for tirofiban>>heparin Indication: LV thrombus   Assessment: 49 y.o. M presented as STEMI - s/p angioplasty. Cardiac surgery consulted for consideration of multivessel CABG. Tirofiban started overnight. Patient became more uncomfortable this morning, concern for early cardiogenic shock. Stat echo showed LV thrombus, taken to cath lab for possible mechanical support. Good cardiac output on relook, no balloon pump placed, starting inotropes.   New orders to transition patient off tirofiban to heparin in light of LV thrombus.  Initial heparin level <0.1 units/ml  Plan:  Increase heparin to 1400 units/hr 6 hour heparin level then daily  Thanks for allowing pharmacy to be a part of this patient's care.  Talbert Cage, PharmD Clinical Pharmacist 06/27/2020 5:20 AM

## 2020-06-27 NOTE — Progress Notes (Signed)
[place] for tirofiban>>heparin Indication: LV thrombus  No Known Allergies  Patient Measurements: Height: _0  (172.7 cm) Weight: 76.2 kg (167 lb 15.9 oz) IBW/kg (Calculated) : 68.4  Vital Signs: Temp: 100.2 F (37.9 C) (10/03 0500) BP: 94/68 (10/03 0500) Pulse Rate: 125 (10/03 0843)  Labs: Recent Labs    06/25/20 2323 06/25/20 2323 06/25/20 2334 06/26/20 0150 06/26/20 0150 06/26/20 1352 06/27/20 0226 06/27/20 1106  HGB 17.3*   < >  --  17.5*   < > 16.0 14.1  --   HCT 53.4*   < >  --  54.5*  --  47.0 43.6  --   PLT 322  --   --  343  --   --  212  --   APTT  --   --  30  --   --   --   --   --   LABPROT  --   --  14.4  --   --   --   --   --   INR  --   --  1.2  --   --   --   --   --   HEPARINUNFRC  --   --   --   --   --   --  <0.10* 0.10*  CREATININE 1.02  --   --  1.00  --   --  1.06  --   TROPONINIHS >27,000*  --   --  >27,000*  --   --   --   --    < > = values in this interval not displayed.    Estimated Creatinine Clearance: 81.6 mL/min (by C-G formula based on SCr of 1.06 mg/dL).   Medical History: History reviewed. No pertinent past medical history.  Medications:  Medications Prior to Admission  Medication Sig Dispense Refill Last Dose  . ALPRAZolam (XANAX) 0.5 MG tablet Take 0.5 mg by mouth at bedtime as needed for sleep.    06/24/2020  . Multiple Vitamins-Minerals (CENTRUM ULTRA MENS) TABS Take 1 tablet by mouth.   06/24/2020  . NARCAN 4 MG/0.1ML LIQD nasal spray kit Place 1 spray into the nose once.    unknown  . traZODone (DESYREL) 100 MG tablet Take 100 mg by mouth at bedtime.   06/24/2020    Assessment: 49 y.o. M presented as STEMI - s/p angioplasty. Cardiac surgery consulted for consideration of multivessel CABG. Tirofiban started on admission. Patient became more uncomfortable this morning, concern for early cardiogenic shock. Stat echo showed LV thrombus, taken to cath lab for possible mechanical support. Good  cardiac output on relook, no balloon pump placed, starting inotropes.   Post cath 11/2 patient was transitioned off tirofiban to heparin for his newly diagnosed LV thombus. Heparin level below goal this afternoon. No bleeding issues noted.   Plan:  Heparin bolus of 2000 units now then increase heparin to 1650 units/hr,  Recheck 6 hour heparin level then daily  Erin Hearing PharmD., BCPS Clinical Pharmacist 06/27/2020 12:12 PM

## 2020-06-27 NOTE — Progress Notes (Signed)
Patient condition became unstable earlier this afternoon about 1400. Pt was desaturating on 6L [place]. Pt O2 sats mid 80s. Patient placed on non-rebreather at 15L O2. RT contacted for HFNC. Cooper MD Paged and returned call; Cooper to discuss plan of care w/ Shirlee Latch MD , Shirlee Latch MD returned this RNs phone call. See orders placed.   Around 1730 pt was hypotensive and began desating down to 88, continued labored breathing, PRN used frequently for pleuritic pain. Not improving. Patient was restless and fidgety. Patient began looking mottled. Dr. Shirlee Latch Notified. Dr.  Johny Chess came back up to low 90s. Dr. Chestine Spore came to bedside, Assessed patient and awaited for Shirlee Latch to arrive. Dr. Salena Saner was also present at bedside Bedside Echo performed per Dr. Shirlee Latch. Official Echo Performed. See results. Decision to intubate the patient emergently was made. Decision for no IBAP insertion at this time as therapy would be ineffective at this point per Dr. Chestine Spore and Dr Shirlee Latch. See CO/CI. Decision was made to sedate and intubate patient to allow patient heart and lungs to rest per MDs at bedside.   Total of fentanyl, 8mg  Versed, 10mg  etomidate, 114.4mg  succinylcholine given per MD orders at the bedside. fentanyl and Versed gtt started.Patient continued to be tachycardic. >150. Adenosine given per Dr. . First dose 6mg , no response from patient. Second dose adenosine given of 12mg  per MD. Patient responded and converted to sinus tachycardia. A-line placed by Shirlee Latch, DO.      RN

## 2020-06-27 NOTE — Progress Notes (Signed)
  NEUROSURGERY PROGRESS NOTE   Received call from Cardiology regarding patient. 49 year old male s/p right L4-5 microdiscectomy by Dr Maurice Small 9/29 at the outpatient surgical who presented to the ED the evening of 10/1 for chest pain x1 day. Code STEMI called after EKG showed ST elevations in inferior leads. He is s/p PTCA/thrombectomy RCA, now with possible pericarditis.  We were called upon admission regarding need for anti-coagulation. Molli Knock was given due to acute MI. Patient's neuro exam has been monitored and there has been no change in BLE strength. I was called today for a wound check due to wound dehiscence. I came by to evaluate the patient.  On exam, there is a "pea sized" opening at the superior aspect of the surgical incision. Deep fascia view limited, but does appear intact. No surrounding redness or active drainage. BLE strength intact.    A/P: S/P R L4-5 lumbar microdiscectomy CAD, MI, ?pericarditis Wound dehiscence, no signs of infection  Under normal circumstances could consider exploration and reclosure, however from patient's current cardiac standpoint, this is not feasible. Will start on Bactrim for infection prophylaxis - okayed by Cards NP. Continue dressing changes as needed. Hopefully wound heals by secondary intention. Will let Dr Maurice Small know about patient's admission to continue to monitor his wound. Please call for any concerns.

## 2020-06-27 NOTE — Progress Notes (Signed)
     I paged cards to see if Jerry [name] could have someone from neuro come and look at the incision from his recent discectomy surgery. Yesterday the incision was open to air and closed with skin glue. During the day today the incision opened at the bottom and has minimal oozing. Day shift RN and I redressed it and placed a foam on top to cover it up, but I would like someone to come assess it to see if it needs to be re-glued.   Will pass on to day shift RN.

## 2020-06-27 NOTE — Consult Note (Signed)
NAME:  Jerry [name], MRN:  [id number], DOB:  06/02/1971, LOS: 2 ADMISSION DATE:  06/25/2020, CONSULTATION DATE:  10/3 REFERRING MD:  Aundra Dubin, CHIEF COMPLAINT:  hypoxia   Brief History   STEMI with LV thrombus, pericarditis  History of present illness   Jerry [name] is a 49 y/o gentleman who presented on 10/1 with chest pain found to have an acute inferior STEMI. He was found to have acute distal RCA occlusion treated with aspiration thrombectomy and balloon angioplasty. He has a CTO LAD with collaterals, severe complex stenossi of the proximal LCx, and severe global and segmental LV dysfunction with elevated LVEDP. His course has been complicated by respiratory failure due to pulmonary edema, pericarditis, and LV thrombus. Today he has had progressive tachycardia, hypotension, and ongoing severe anterior chest pain that is only improved with leaning forward. He has had colchicine, aspirin, and morphine without significant relief in his chest pain. Never smoker, no previous PMH.  Past Medical History    Significant Hospital Events   LHC with aspiration thrombectomy and balloon angioplasty on 10/1  Consults:  PCCM Cardiology  Procedures:  LHC 10/1  Significant Diagnostic Tests:  Echo: LVEF 25-30%, LV thrombus,   Micro Data:  Blood cx 10/3> MRSa negative covid negative  Antimicrobials:  Cefepime 10/3> Bactrim 10/3> vancomycin 10/3>  Interim history/subjective:    Objective   Blood pressure 99/73, pulse (!) 124, temperature (!) 101.1 F (38.4 C), resp. rate (!) 23, height 5\' 8"  (1.727 m), weight 76.2 kg, SpO2 92 %. PAP: (21-55)/(11-42) 38/24 CVP:  [0 mmHg-26 mmHg] 1 mmHg CO:  [4.7 L/min-5.9 L/min] 4.7 L/min CI:  [2.5 L/min/m2-3.1 L/min/m2] 2.5 L/min/m2      Intake/Output Summary (Last 24 hours) at 06/27/2020 1759 Last data filed at 06/27/2020 1700 Gross per 24 hour  Intake 652.44 ml  Output 2200 ml  Net -1547.56 ml   Filed Weights   06/25/20 2347 06/26/20 0115  06/27/20 0500  Weight: 80.7 kg 76.3 kg 76.2 kg    Examination: General: ill appearing man sitting up in bed leaning forward to relieve pain HENT: [place]/AT, eyes anicteirc Lungs: tachypneic, decreased basilar breath sounds, faint basilar rhales. Speaking in near complete sentences. Cardiovascular: tachycardic, regular rhythm Abdomen: nondistended Extremities: no peripheral edema Neuro: awake and alert, answering questions appropriately Derm: no rashes or wounds  CXR personally reviewed> basilar infiltrates likely due to atelectasis, mildly increased interstitial markings  Bedside echo- no perciardial effusion  Resolved Hospital Problem list    Assessment & Plan:  Cardiogenic shock due to acute decompensated heart failure from ICM Acute inferior STEMI CTO LAD, CAD of LCx as well Pericarditis -con't norepi -STAT co-ox and LA now -STAT echo to evaluate for pericardial effusion -no steroids due to increased risk of LV rupture; con't colchicine. Additional 4mg  morphine now -ASA and atorvastatin daily -con't heparin gtt -spironolactone -unable to tolerate Bblocker and ACE or ARB due to shock  Acute hypoxic respiratory failure due to cardiogenic pulmonary edema, likely also splinting due to pain -pain control -low threshold for intubation; would not tolerate laying flat for a procedure due to pain control -intubation to stabilize hemodynamically and facilitate improved pain control      Best practice:  Diet: NPO Pain/Anxiety/Delirium protocol (if indicated):  VAP protocol (if indicated): DVT prophylaxis: heparin GI prophylaxis: pantoprazole Glucose control: per primary Mobility: bedrest Code Status: full Family Communication: wife at bedside Disposition: ICU  Labs   CBC: Recent Labs  Lab 06/25/20 2323 06/26/20 0150 06/26/20 1352 06/27/20 0737  WBC 29.5* 32.0*  --  25.5*  HGB 17.3* 17.5* 16.0 14.1  HCT 53.4* 54.5* 47.0 43.6  MCV 91.1 92.5  --  90.6  PLT 322 343   --  127    Basic Metabolic Panel: Recent Labs  Lab 06/25/20 2323 06/26/20 0150 06/26/20 1352 06/27/20 0226  NA 140 140 141 136  K 4.0 4.1 3.6 3.5  CL 102 103  --  100  CO2 23 22  --  24  GLUCOSE 138* 142*  --  112*  BUN 14 14  --  18  CREATININE 1.02 1.00  --  1.06  CALCIUM 9.6 9.2  --  8.7*   GFR: Estimated Creatinine Clearance: 81.6 mL/min (by C-G formula based on SCr of 1.06 mg/dL). Recent Labs  Lab 06/25/20 2323 06/26/20 0150 06/27/20 0226 06/27/20 1106 06/27/20 1134 06/27/20 1544  PROCALCITON  --   --   --  1.21  --   --   WBC 29.5* 32.0* 25.5*  --   --   --   LATICACIDVEN  --   --   --   --  1.2 1.5    Liver Function Tests: No results for input(s): AST, ALT, ALKPHOS, BILITOT, PROT, ALBUMIN in the last 168 hours. No results for input(s): LIPASE, AMYLASE in the last 168 hours. No results for input(s): AMMONIA in the last 168 hours.  ABG    Component Value Date/Time   HCO3 26.7 06/26/2020 1352   TCO2 28 06/26/2020 1352   O2SAT 45.7 06/27/2020 1106     Coagulation Profile: Recent Labs  Lab 06/25/20 2334  INR 1.2    Cardiac Enzymes: No results for input(s): CKTOTAL, CKMB, CKMBINDEX, TROPONINI in the last 168 hours.  HbA1C: Hgb A1c MFr Bld  Date/Time Value Ref Range Status  06/25/2020 11:34 PM 5.6 4.8 - 5.6 % Final    Comment:    (NOTE) Pre diabetes:          5.7%-6.4%  Diabetes:              >6.4%  Glycemic control for   <7.0% adults with diabetes     CBG: No results for input(s): GLUCAP in the last 168 hours.  Review of Systems:   Limited due to acuity of condition  Past Medical History    has no past medical history on file.   Surgical History    Past Surgical History:  Procedure Laterality Date  . right clavicle fracture reapir  2010     Social History   reports that he has never smoked. He does not have any smokeless tobacco history on file. He reports current alcohol use of about 1.0 standard drink of alcohol per week. He  reports that he does not use drugs.   Family History   His family history is not on file.   Allergies No Known Allergies   Home Medications  Prior to Admission medications   Medication Sig Start Date End Date Taking? Authorizing Provider  ALPRAZolam Duanne Moron) 0.5 MG tablet Take 0.5 mg by mouth at bedtime as needed for sleep.  06/07/20  Yes [provider]  Multiple Vitamins-Minerals (CENTRUM ULTRA MENS) TABS Take 1 tablet by mouth.   Yes [provider]  NARCAN 4 MG/0.1ML LIQD nasal spray kit Place 1 spray into the nose once.  04/24/20  Yes [provider]  traZODone (DESYREL) 100 MG tablet Take 100 mg by mouth at bedtime. 06/13/20  Yes [provider]  This patient is critically ill with multiple organ system failure which requires frequent high complexity decision making, assessment, support, evaluation, and titration of therapies. This was completed through the application of advanced monitoring technologies and extensive interpretation of multiple databases. During this encounter critical care time was devoted to patient care services described in this note for 45 minutes.  Julian Hy, DO 06/27/20 6:22 PM [facility]

## 2020-06-27 NOTE — Progress Notes (Signed)
Patient ID: Tymeer Vaquera, male   DOB: 21-Mar-1971, 49 y.o.   MRN: [id number]  Through the afternoon, patient became more unstable.  Ongoing severe pleuritic chest pain and unable to take a deep breath.  Now on 15 L high flow oxygen.  BP lower, started on norepinephrine 3. Lactate sent, returned at 1.2 with co-ox 59% on norepinephrine 3 + milrinone 0.375.  He received 60 mg IV Lasix this afternoon.  He became more tachycardic, up to 150s with any movement. Given fever and PCT 1.21 as well as an area of wound dehiscence in his back, broad spectrum abx added.  Bedside echo done, no pericardial effusion.  Normal RV function.  LV EF 25% range with apical and peri-apical akinesis.  No thrombus was identified on this echo, Definity was used.  No pseudoaneurysm or rupture identified.  Mild MR.   With intractable pleuritic pain and tachycardia along with marginal oxygenation, decision was made to intubate patient.  He was sedated and intubated, arterial line was placed.  PA pressure dropped and HR dropped from 150 to around 130.  Cardiac index 3.2 off [place].   Continue milrinone 0.375 and NE currently at 10.  He will get another dose of IV Lasix this evening.  Discussed with Drs Dorris Fetch and Chestine Spore, we opted against IABP with good cardiac output and sinus tachy driven significantly, it appears, by pain.  In future, Impella appears to be an option now that LV thrombus is no longer present. Resend BMET.    CRITICAL CARE Performed by: Marca Ancona  Total critical care time: 65 minutes  Critical care time was exclusive of separately billable procedures and treating other patients.  Critical care was necessary to treat or prevent imminent or life-threatening deterioration.  Critical care was time spent personally by me on the following activities: development of treatment plan with patient and/or surrogate as well as nursing, discussions with consultants, evaluation of patient's response to treatment,  examination of patient, obtaining history from patient or surrogate, ordering and performing treatments and interventions, ordering and review of laboratory studies, ordering and review of radiographic studies, pulse oximetry and re-evaluation of patient's condition.  Marca Ancona 06/27/2020 7:38 PM

## 2020-06-28 ENCOUNTER — Encounter (HOSPITAL_COMMUNITY): Payer: Self-pay | Admitting: Cardiovascular Disease

## 2020-06-28 DIAGNOSIS — I241 Dressler's syndrome: Secondary | ICD-10-CM

## 2020-06-28 DIAGNOSIS — I213 ST elevation (STEMI) myocardial infarction of unspecified site: Secondary | ICD-10-CM

## 2020-06-28 DIAGNOSIS — J810 Acute pulmonary edema: Secondary | ICD-10-CM | POA: Diagnosis not present

## 2020-06-28 DIAGNOSIS — I25110 Atherosclerotic heart disease of native coronary artery with unstable angina pectoris: Secondary | ICD-10-CM | POA: Diagnosis not present

## 2020-06-28 DIAGNOSIS — Z9911 Dependence on respirator [ventilator] status: Secondary | ICD-10-CM

## 2020-06-28 LAB — POCT I-STAT EG7
Acid-Base Excess: 2 mmol/L (ref 0.0–2.0)
Bicarbonate: 26.4 mmol/L (ref 20.0–28.0)
Calcium, Ion: 1.23 mmol/L (ref 1.15–1.40)
HCT: 48 % (ref 39.0–52.0)
Hemoglobin: 16.3 g/dL (ref 13.0–17.0)
O2 Saturation: 58 %
Potassium: 3.6 mmol/L (ref 3.5–5.1)
Sodium: 141 mmol/L (ref 135–145)
TCO2: 28 mmol/L (ref 22–32)
pCO2, Ven: 38.3 mmHg — ABNORMAL LOW (ref 44.0–60.0)
pH, Ven: 7.446 — ABNORMAL HIGH (ref 7.250–7.430)
pO2, Ven: 29 mmHg — CL (ref 32.0–45.0)

## 2020-06-28 LAB — POCT I-STAT 7, (LYTES, BLD GAS, ICA,H+H)
Acid-Base Excess: 4 mmol/L — ABNORMAL HIGH (ref 0.0–2.0)
Acid-base deficit: 1 mmol/L (ref 0.0–2.0)
Bicarbonate: 29.5 mmol/L — ABNORMAL HIGH (ref 20.0–28.0)
Bicarbonate: 22.7 mmol/L (ref 20.0–28.0)
Calcium, Ion: 1.07 mmol/L — ABNORMAL LOW (ref 1.15–1.40)
Calcium, Ion: 1.15 mmol/L (ref 1.15–1.40)
HCT: 39 % (ref 39.0–52.0)
HCT: 48 % (ref 39.0–52.0)
Hemoglobin: 13.3 g/dL (ref 13.0–17.0)
Hemoglobin: 16.3 g/dL (ref 13.0–17.0)
O2 Saturation: 99 %
O2 Saturation: 96 %
Patient temperature: 37.6
Potassium: 4.4 mmol/L (ref 3.5–5.1)
Potassium: 3.7 mmol/L (ref 3.5–5.1)
Sodium: 136 mmol/L (ref 135–145)
Sodium: 140 mmol/L (ref 135–145)
TCO2: 31 mmol/L (ref 22–32)
TCO2: 24 mmol/L (ref 22–32)
pCO2 arterial: 46.2 mmHg (ref 32.0–48.0)
pCO2 arterial: 33.5 mmHg (ref 32.0–48.0)
pH, Arterial: 7.416 (ref 7.350–7.450)
pH, Arterial: 7.438 (ref 7.350–7.450)
pO2, Arterial: 158 mmHg — ABNORMAL HIGH (ref 83.0–108.0)
pO2, Arterial: 77 mmHg — ABNORMAL LOW (ref 83.0–108.0)

## 2020-06-28 LAB — BASIC METABOLIC PANEL
Anion gap: 10 (ref 5–15)
BUN: 20 mg/dL (ref 6–20)
CO2: 24 mmol/L (ref 22–32)
Calcium: 8 mg/dL — ABNORMAL LOW (ref 8.9–10.3)
Chloride: 101 mmol/L (ref 98–111)
Creatinine, Ser: 1.08 mg/dL (ref 0.61–1.24)
GFR calc Af Amer: >60 mL/min (ref >60)
GFR calc non Af Amer: >60 mL/min (ref >60)
Glucose, Bld: 151 mg/dL — ABNORMAL HIGH (ref 70–99)
Potassium: 4 mmol/L (ref 3.5–5.1)
Sodium: 135 mmol/L (ref 135–145)

## 2020-06-28 LAB — GLUCOSE, CAPILLARY
Glucose-Capillary: 137 mg/dL — ABNORMAL HIGH (ref 70–99)
Glucose-Capillary: 145 mg/dL — ABNORMAL HIGH (ref 70–99)
Glucose-Capillary: 147 mg/dL — ABNORMAL HIGH (ref 70–99)
Glucose-Capillary: 167 mg/dL — ABNORMAL HIGH (ref 70–99)
Glucose-Capillary: 168 mg/dL — ABNORMAL HIGH (ref 70–99)
Glucose-Capillary: 170 mg/dL — ABNORMAL HIGH (ref 70–99)
Glucose-Capillary: 186 mg/dL — ABNORMAL HIGH (ref 70–99)

## 2020-06-28 LAB — MAGNESIUM
Magnesium: 1.9 mg/dL (ref 1.7–2.4)
Magnesium: 2.5 mg/dL — ABNORMAL HIGH (ref 1.7–2.4)

## 2020-06-28 LAB — CBC
HCT: 39.5 % (ref 39.0–52.0)
Hemoglobin: 12.9 g/dL — ABNORMAL LOW (ref 13.0–17.0)
MCH: 29.4 pg (ref 26.0–34.0)
MCHC: 32.7 g/dL (ref 30.0–36.0)
MCV: 90 fL (ref 80.0–100.0)
Platelets: 231 10*3/uL (ref 150–400)
RBC: 4.39 MIL/uL (ref 4.22–5.81)
RDW: 12.7 % (ref 11.5–15.5)
WBC: 27.3 10*3/uL — ABNORMAL HIGH (ref 4.0–10.5)
nRBC: 0 % (ref 0.0–0.2)

## 2020-06-28 LAB — POCT ACTIVATED CLOTTING TIME: Activated Clotting Time: 230 seconds

## 2020-06-28 LAB — ECHOCARDIOGRAM COMPLETE
Height: 68 in
Weight: 2687.85 oz

## 2020-06-28 LAB — COOXEMETRY PANEL
Carboxyhemoglobin: 1.2 % (ref 0.5–1.5)
Methemoglobin: 0.9 % (ref 0.0–1.5)
O2 Saturation: 69.3 %
Total hemoglobin: 13.7 g/dL (ref 12.0–16.0)

## 2020-06-28 LAB — PHOSPHORUS: Phosphorus: 2.2 mg/dL — ABNORMAL LOW (ref 2.5–4.6)

## 2020-06-28 LAB — HEPARIN LEVEL (UNFRACTIONATED): Heparin Unfractionated: 0.3 IU/mL (ref 0.30–0.70)

## 2020-06-28 MED ORDER — POTASSIUM CHLORIDE 20 MEQ PO PACK
20.0000 meq | PACK | Freq: Once | ORAL | Status: AC
  Administered 2020-06-28: 20 meq
  Filled 2020-06-28: qty 1

## 2020-06-28 MED ORDER — ACETAMINOPHEN 325 MG PO TABS
650.0000 mg | ORAL_TABLET | Freq: Four times a day (QID) | ORAL | Status: DC | PRN
  Administered 2020-06-29 – 2020-06-30 (×2): 650 mg
  Filled 2020-06-28 (×2): qty 2

## 2020-06-28 MED ORDER — ACETAMINOPHEN 160 MG/5ML PO SOLN
500.0000 mg | Freq: Four times a day (QID) | ORAL | Status: AC
  Administered 2020-06-28 – 2020-06-29 (×4): 500 mg
  Filled 2020-06-28 (×4): qty 20.3

## 2020-06-28 MED ORDER — PROSOURCE TF PO LIQD
45.0000 mL | Freq: Two times a day (BID) | ORAL | Status: DC
  Administered 2020-06-28 – 2020-06-30 (×5): 45 mL
  Filled 2020-06-28 (×5): qty 45

## 2020-06-28 MED ORDER — FUROSEMIDE 10 MG/ML IJ SOLN
40.0000 mg | Freq: Once | INTRAMUSCULAR | Status: AC
  Administered 2020-06-28: 40 mg via INTRAVENOUS
  Filled 2020-06-28 (×2): qty 4

## 2020-06-28 MED ORDER — INSULIN ASPART 100 UNIT/ML ~~LOC~~ SOLN
1.0000 [IU] | SUBCUTANEOUS | Status: DC
  Administered 2020-06-28: 1 [IU] via SUBCUTANEOUS
  Administered 2020-06-28 – 2020-06-29 (×4): 2 [IU] via SUBCUTANEOUS
  Administered 2020-06-29: 1 [IU] via SUBCUTANEOUS
  Administered 2020-06-29: 2 [IU] via SUBCUTANEOUS
  Administered 2020-06-30: 1 [IU] via SUBCUTANEOUS
  Administered 2020-06-30: 2 [IU] via SUBCUTANEOUS
  Administered 2020-06-30 – 2020-07-04 (×10): 1 [IU] via SUBCUTANEOUS
  Administered 2020-07-05: 2 [IU] via SUBCUTANEOUS
  Administered 2020-07-05: 1 [IU] via SUBCUTANEOUS
  Administered 2020-07-06: 2 [IU] via SUBCUTANEOUS
  Administered 2020-07-06: 1 [IU] via SUBCUTANEOUS

## 2020-06-28 MED ORDER — ORAL CARE MOUTH RINSE
15.0000 mL | OROMUCOSAL | Status: DC
  Administered 2020-06-28 – 2020-06-30 (×20): 15 mL via OROMUCOSAL

## 2020-06-28 MED ORDER — MAGNESIUM SULFATE 2 GM/50ML IV SOLN
2.0000 g | Freq: Once | INTRAVENOUS | Status: AC
  Administered 2020-06-28: 2 g via INTRAVENOUS
  Filled 2020-06-28: qty 50

## 2020-06-28 MED ORDER — POTASSIUM & SODIUM PHOSPHATES 280-160-250 MG PO PACK
2.0000 | PACK | Freq: Once | ORAL | Status: AC
  Administered 2020-06-28: 2
  Filled 2020-06-28: qty 2

## 2020-06-28 MED ORDER — CHLORHEXIDINE GLUCONATE 0.12% ORAL RINSE (MEDLINE KIT)
15.0000 mL | Freq: Two times a day (BID) | OROMUCOSAL | Status: DC
  Administered 2020-06-28 – 2020-06-30 (×5): 15 mL via OROMUCOSAL

## 2020-06-28 MED ORDER — VITAL 1.5 CAL PO LIQD
1000.0000 mL | ORAL | Status: DC
  Administered 2020-06-28 – 2020-06-29 (×2): 1000 mL

## 2020-06-28 MED FILL — Medication: Qty: 1 | Status: AC

## 2020-06-28 NOTE — Progress Notes (Addendum)
Advanced Heart Failure Rounding Note  PCP-Cardiologist: Sherren Mocha, MD    Patient Profile   49 y/o male s/p recent back surgery, admitted for acute inferior and anterolateral MI. LHC showed thrombotic occlusion of distal RCA that was treated with PTCA and thrombectomy.  He was also noted to have chronic total occlusion of the LAD with collaterals from the right (thus this territory was affected by the RCA MI) as well as 90% complex proximal LCx stenosis. EF 25-30% + apical thrombus. RV normal. Course b/c cardiogenic shock and acute hypoxic respiratory failure. ? Component of septic shock.    Subjective:    Intubated and sedated.   On Milrinone 0.375 + NE 15. Co-ox 69%. CVP 10. MAPs upper 70s.   Swan #s CO 5.42 CI 2.84 PAP 48/33 (40) CVP 10   Febrile. mTemp 102. WBC 31>>27K. Blood cultures NGTD. PCT 1.21. CXR yesterday showed pulmonary edema. No infiltrate. On Vanc + cefepime.   - 3L in UOP yesterday. SCr 1.08   Objective:   Weight Range: 70.2 kg Body mass index is 23.53 kg/m.   Vital Signs:   Temp:  [99.5 F (37.5 C)-102.4 F (39.1 C)] 99.9 F (37.7 C) (10/04 0628) Pulse Rate:  [101-158] 101 (10/04 0628) Resp:  [0-32] 0 (10/04 0628) BP: (66-116)/(55-78) 114/75 (10/04 0600) SpO2:  [86 %-100 %] 100 % (10/04 0628) FiO2 (%):  [50 %-70 %] 50 % (10/04 0221) Weight:  [70.2 kg] 70.2 kg (10/04 0500) Last BM Date: 06/23/20  Weight change: Filed Weights   06/26/20 0115 06/27/20 0500 06/28/20 0500  Weight: 76.3 kg 76.2 kg 70.2 kg    Intake/Output:   Intake/Output Summary (Last 24 hours) at 06/28/2020 0734 Last data filed at 06/28/2020 0610 Gross per 24 hour  Intake 1783.69 ml  Output 3350 ml  Net -1566.31 ml      Physical Exam    CVP 10  General:  Critically ill, intubated and sedated  HEENT: +ETT Neck: Supple. Unable to visualize JVP . Carotids 2+ bilat; no bruits. No lymphadenopathy or thyromegaly appreciated. Cor: PMI nondisplaced. Regular rate &  rhythm. No rubs, gallops or murmurs. Lungs: intubated, clear  Abdomen: Soft, nontender, nondistended. No hepatosplenomegaly. No bruits or masses. Good bowel sounds. Extremities: No cyanosis, clubbing, rash, edema Neuro: intubated and sedated   Telemetry   Sinus tach low 100s   EKG    No new EKG to review   Labs    CBC Recent Labs    06/27/20 2011 06/27/20 2011 06/28/20 0500 06/28/20 0521  WBC 30.8*  --  27.3*  --   HGB 13.7   < > 12.9* 13.3  HCT 42.2   < > 39.5 39.0  MCV 89.8  --  90.0  --   PLT 243  --  231  --    < > = values in this interval not displayed.   Basic Metabolic Panel Recent Labs    06/27/20 2011 06/27/20 2011 06/28/20 0500 06/28/20 0521  NA 135   < > 135 136  K 4.2   < > 4.0 4.4  CL 100  --  101  --   CO2 23  --  24  --   GLUCOSE 149*  --  151*  --   BUN 21*  --  20  --   CREATININE 1.17  --  1.08  --   CALCIUM 8.1*  --  8.0*  --   MG  --   --  1.9  --    < > =  values in this interval not displayed.   Liver Function Tests No results for input(s): AST, ALT, ALKPHOS, BILITOT, PROT, ALBUMIN in the last 72 hours. No results for input(s): LIPASE, AMYLASE in the last 72 hours. Cardiac Enzymes No results for input(s): CKTOTAL, CKMB, CKMBINDEX, TROPONINI in the last 72 hours.  BNP: BNP (last 3 results) Recent Labs    06/25/20 2334 06/26/20 0150  BNP 349.1* 368.4*    ProBNP (last 3 results) No results for input(s): PROBNP in the last 8760 hours.   D-Dimer No results for input(s): DDIMER in the last 72 hours. Hemoglobin A1C Recent Labs    06/25/20 2334  HGBA1C 5.6   Fasting Lipid Panel Recent Labs    06/25/20 2323  CHOL 185  HDL 31*  LDLCALC 121*  TRIG 165*  CHOLHDL 6.0   Thyroid Function Tests Recent Labs    06/26/20 0150  TSH 1.488    Other results:   Imaging    DG Abd 1 View  Addendum Date: 06/27/2020   ADDENDUM REPORT: 06/27/2020 21:06 ADDENDUM: These results were called by telephone at the time of  interpretation on 06/27/2020 at 9:05 pm to provider [place], who verbally acknowledged these results. Electronically Signed   By: Iven Finn M.D.   On: 06/27/2020 21:06   Result Date: 06/27/2020 CLINICAL DATA:  Enteric tube placement. EXAM: ABDOMEN - 1 VIEW COMPARISON:  X-ray abdomen 08/11/2019. FINDINGS: Enteric tube with tip and side port overlying the expected region of the gastric lumen. The bowel gas pattern is normal. Hyperdensity overlying the left L4 vertebral body again noted (similar to 08/11/19). IMPRESSION: 1. Enteric tube in good position. 2. Please see separately dictated chest x-ray 06/27/2020. Electronically Signed: By: Iven Finn M.D. On: 06/27/2020 20:52   DG CHEST PORT 1 VIEW  Result Date: 06/27/2020 CLINICAL DATA:  Endotracheal tube placement EXAM: PORTABLE CHEST 1 VIEW COMPARISON:  06/27/2020 FINDINGS: The endotracheal tube terminates above the carina by approximately 2.9 cm. The Swan-Ganz catheter projects over the main right pulmonary artery. The heart size is unchanged. Again noted are findings of pulmonary edema. There is no definite pneumothorax. There are probable bilateral pleural effusions. The enteric tube extends below the left hemidiaphragm. The tip is not visualized on this study. There are postsurgical changes at the left lung apex. IMPRESSION: 1. Lines and tubes as above. 2. Persistent pulmonary edema with bilateral pleural effusions. Electronically Signed   By: Constance Holster M.D.   On: 06/27/2020 23:47   DG CHEST PORT 1 VIEW  Result Date: 06/27/2020 CLINICAL DATA:  Endotracheal tube placement. EXAM: PORTABLE CHEST 1 VIEW COMPARISON:  Chest x-ray 07/02/2020 8:48 a.m. FINDINGS: Interval placement of an endotracheal tube with tip terminating approximately 1.5 Cm above the carina. Interval placement enteric tube again noted to course below the diaphragm with tip and side port collimated off view. stable right internal jugular approach swan Ganz  catheter with tip overlying the expected region of the proximal/mid right main pulmonary artery. Cardiac paddles overlie the patient. The heart size and mediastinal contours are unchanged. Left upper lobe pulmonary sutures. Redemonstration of bibasilar reticular opacities. No focal consolidation. News fusion of bilateral trace effusion. No pneumothorax. No acute osseous abnormality. IMPRESSION: 1. Redemonstration of pulmonary findings consistent with pulmonary edema and bilateral trace pleural effusions. Underlying infection/inflammation not excluded. 2. Interval placement of an enteric and endotracheal tube. Consider retracting endotracheal tube by 1 cm. Electronically Signed   By: Iven Finn M.D.   On: 06/27/2020 20:49  DG CHEST PORT 1 VIEW  Result Date: 06/27/2020 CLINICAL DATA:  STEMI EXAM: PORTABLE CHEST 1 VIEW COMPARISON:  June 25, 2020 FINDINGS: The cardiomediastinal silhouette is unchanged and enlarged in contour.RIGHT IJ PA catheter tip terminates over the RIGHT lower lobar pulmonary artery. Small bilateral pleural effusions. No pneumothorax. Bibasilar predominant interstitial prominence, reticulation and perihilar vascular fullness. Sutures project over the upper LEFT lung. Visualized abdomen is unremarkable. Status post ORIF of the RIGHT clavicle. IMPRESSION: 1.  Support apparatus as described above. 2. Constellation of findings are consistent with pulmonary edema and small bilateral pleural effusions, similar in comparison to prior. Electronically Signed   By: Valentino Saxon MD   On: 06/27/2020 09:08   ABORTED INVASIVE LAB PROCEDURE  Result Date: 06/28/2020 This case was aborted.     Medications:     Scheduled Medications: . aspirin  325 mg Per Tube BID  . atorvastatin  80 mg Per Tube Daily  . chlorhexidine gluconate (MEDLINE KIT)  15 mL Mouth Rinse BID  . Chlorhexidine Gluconate Cloth  6 each Topical Daily  . colchicine  0.6 mg Per Tube BID  . digoxin  0.125 mg Per Tube  Daily  . docusate  100 mg Per Tube BID  . mouth rinse  15 mL Mouth Rinse 10 times per day  . pantoprazole (PROTONIX) IV  40 mg Intravenous Q24H  . polyethylene glycol  17 g Per Tube Daily  . sodium chloride flush  3 mL Intravenous Q12H  . spironolactone  12.5 mg Per Tube Daily  . traZODone  100 mg Per Tube QHS     Infusions: . sodium chloride Stopped (06/27/20 1726)  . sodium chloride 10 mL/hr at 06/26/20 0700  . ceFEPime (MAXIPIME) IV 2 g (06/28/20 0610)  . dexmedetomidine (PRECEDEX) IV infusion 0.4 mcg/kg/hr (06/28/20 0600)  . fentaNYL infusion INTRAVENOUS 400 mcg/hr (06/28/20 0733)  . heparin 1,800 Units/hr (06/28/20 0600)  . midazolam 2 mg/hr (06/28/20 0600)  . milrinone 0.375 mcg/kg/min (06/28/20 0600)  . norepinephrine (LEVOPHED) Adult infusion 16 mcg/min (06/28/20 0600)  . vancomycin       PRN Medications:  sodium chloride, acetaminophen, ALPRAZolam, fentaNYL, midazolam, morphine injection, nitroGLYCERIN, ondansetron (ZOFRAN) IV, sodium chloride flush, zolpidem    Assessment/Plan   1. CAD: Late presentation inferior MI.  Cath showed thrombotic occlusion distal RCA, CTO LAD with collaterals from right, and complex 90% proximal LCx stenosis.  He had PTCA/thrombectomy RCA with good flow at end of procedure.  Suspect he had damage to LAD territory as well as RCA territory as RCA collateralized the LAD.  Currently, he has persistent STE on ECG and has prominent pleuritic chest pain as well as a soft friction rub. Suspect post-MI pericarditis. ESR elevated at 20. Pain likely plays a major role in his tachycardia.  Initial Echo w/ EF 25-30%. RV ok. + LV thrombus  - repeat bedside echo 10/3  EF 25%, normal RV w/ no pericardial effusion, no LV thrombus seen on this echo.  - Colchicine 0.6 bid for pericarditis.   - Continue ASA 325 mg bid for pericarditis  - Continue high dose statin.  - Eventually, would ideally have CABG.  Has been seen by TCTS.  Will be high risk in setting of  depressed EF and marginal cardiac output.  Needs stabilization first. Placement of Impella to support cardiac surgery now seems feasible with no LV thrombus on last echo.  Will consider MRI for viability once more stable, if he has a lot of nonviable territory, may  not be CABG candidate.  2. Acute systolic CHF>>Cardiogenic Shock: Echo with EF 25-30%, RV ok (no evidence for RV infarct), has LV thrombus.  Swan in place, adequate CO by thermodilution on milrinone 0. 375, NE 16. Co-ox 69%. CVP 10 - Continue milrinone  - Continue digoxin 0.125 daily.  - Hold spironolactone given NE use.  - To get him through eventual CABG, think he would need mechanical support.  Impella now appears to be an option with no LV thrombus on last echo.   3. LV thrombus: On IV heparin gtt.  4. ID: Febrile to 102 overnight, WBCs to 26>>31>>27. This may be effect of acute MI with post-infarct pericarditis but treating empirically w/ broad spectrum abx, on vanc + cefepime.  - Blood cultures NGTD - Procalcitonin 1.21. Follow trend  - CXR 8/2 showed pulmonary edema, no infiltrate  5. S/p back surgery: Micro-discectomy last week.  Per nursing, the wound appears to have opened some compared to initial arrival.  Not draining, no redness.  - Neurosurgery ok'd anticoagulation at time of admission.  - now on broad spectrum abx  - Neurosurgery following, continue dressing changes as needed    Length of Stay: 3  Brittainy Ladoris Gene  06/28/2020, 7:34 AM  Advanced Heart Failure Team Pager [phone] (M-F; 7a - 4p)  Please contact [facility] for night-coverage after hours (4p -7a ) and weekends on [email or web address]  Patient seen with PA, agree with the above note.   Intubated yesterday with tachycardia, hypoxemia, and marked discomfort from post-MI pericarditis .  Today, more stable.  Good co-ox at 69% with CI 2.8 off Swan.  CVP 8-9 on my read.  Still with low grade fever to 100, on vancomycin/cefepime. HR down to around 100  bpm.  Echo yesterday prior to intubation with EF 25%, no thrombus noted, no pericardial effusion, no pseudoaneurysm, normal RV.   General: Intubated/sedated.  Neck: JVP 8 cm, no thyromegaly or thyroid nodule.  Lungs: Clear to auscultation bilaterally with normal respiratory effort. CV: Nondisplaced PMI.  Heart regular S1/S2, no S3/S4, do not hear rub today.  No peripheral edema.   Abdomen: Soft, nontender, no hepatosplenomegaly, no distention.  Skin: Intact without lesions or rashes.  Neurologic: Sedated on vent.  Extremities: No clubbing or cyanosis.  HEENT: Normal.   Cardiogenic shock but stable on current meds with milrinone 0.375 and NE 16.  ?Distributive component due to inflammation with fever, high WBCs. CVP mildly elevated. Good co-ox and thermodilution CO.  - Continue milrinone 0.375 - Wean NE today.  - Continue digoxin 0.125 daily.  - Lasix 40 mg IV x 1.   Post-infarct pericarditis without significant pericardial effusion or pseudoaneurysm on yesterday's echo.  Very markedly symptomatic with pleuritic chest pain.  - No steroids post-MI, no traditional NSAIDs.  - Will give ASA 325 mg bid for now.  - Continue colchicine 0.6 mg bid.  - Keep sedated today with Versed/Fentanyl, begin weaning vent tomorrow hopefully.   Fever, with WBCs 27 and PCT 1.3 => ?due to post-infarct pericarditis versus underlying infection.  Cultures sent.  - Continue broad spectrum abx for now.   Severe 3VD, CTO LAD with thrombotic RCA occlusion and complex 90% proximal LCX.  Suspect infarct this admission involved RCA and LAD territory (LAD is collateralized by RCA).  Now s/p PTCA/thrombectomy RCA.   - He has been seen by TCTS, considering for CABG when stabilized.  He should be able to get Impella 5.5 as LV support with resolution of LV thrombus  on last echo.   LV thrombus resolved on echo yesterday, suspect it was acute and not chronic.  Continue heparin gtt.   CRITICAL CARE Performed by: Loralie Champagne  Total critical care time: 40 minutes  Critical care time was exclusive of separately billable procedures and treating other patients.  Critical care was necessary to treat or prevent imminent or life-threatening deterioration.  Critical care was time spent personally by me on the following activities: development of treatment plan with patient and/or surrogate as well as nursing, discussions with consultants, evaluation of patient's response to treatment, examination of patient, obtaining history from patient or surrogate, ordering and performing treatments and interventions, ordering and review of laboratory studies, ordering and review of radiographic studies, pulse oximetry and re-evaluation of patient's condition.  Loralie Champagne 06/28/2020 12:24 PM

## 2020-06-28 NOTE — Progress Notes (Signed)
NAME:  Jerry [name], MRN:  [id number], DOB:  [birth year], LOS: 3 ADMISSION DATE:  06/25/2020, CONSULTATION DATE:  10/3 REFERRING MD:  Shirlee Latch, CHIEF COMPLAINT:  hypoxia   Brief History   STEMI with LV thrombus, pericarditis  History of present illness   Jerry [name] is a 49 y/o gentleman who presented on 10/1 with chest pain found to have an acute inferior STEMI. He was found to have acute distal RCA occlusion treated with aspiration thrombectomy and balloon angioplasty. He has a CTO LAD with collaterals, severe complex stenossi of the proximal LCx, and severe global and segmental LV dysfunction with elevated LVEDP. His course has been complicated by respiratory failure due to pulmonary edema, pericarditis, and LV thrombus. Today he has had progressive tachycardia, hypotension, and ongoing severe anterior chest pain that is only improved with leaning forward. He has had colchicine, aspirin, and morphine without significant relief in his chest pain. Never smoker, no previous PMH.  Past Medical History    Significant Hospital Events   LHC with aspiration thrombectomy and balloon angioplasty on 10/1  Consults:  PCCM Cardiology  Procedures:  LHC 10/1  Significant Diagnostic Tests:  Echo: LVEF 25-30%, LV thrombus,   Micro Data:  Blood cx 10/3> MRSa negative covid negative  Antimicrobials:  Cefepime 10/3> Bactrim 10/3> vancomycin 10/3>  Interim history/subjective:  Intubated last evening, HR improved control with fevers resolved.   Objective   Blood pressure 111/74, pulse (!) 101, temperature 100.2 F (37.9 C), resp. rate 20, height 5\' 8"  (1.727 m), weight 70.2 kg, SpO2 100 %. PAP: (26-90)/(18-57) 45/31 CVP:  [0 mmHg-32 mmHg] 11 mmHg CO:  [4.1 L/min-6 L/min] 5.4 L/min CI:  [2.2 L/min/m2-3.2 L/min/m2] 2.9 L/min/m2  Vent Mode: PRVC FiO2 (%):  [50 %-70 %] 50 % Set Rate:  [20 bmp] 20 bmp Vt Set:  [500 mL-540 mL] 540 mL PEEP:  [8 cmH20] 8 cmH20 Plateau Pressure:  [17 cmH20-22  cmH20] 18 cmH20   Intake/Output Summary (Last 24 hours) at 06/28/2020 0837 Last data filed at 06/28/2020 0610 Gross per 24 hour  Intake 1783.69 ml  Output 3350 ml  Net -1566.31 ml   Filed Weights   06/26/20 0115 06/27/20 0500 06/28/20 0500  Weight: 76.3 kg 76.2 kg 70.2 kg    Examination: General: Ill-appearing man intubated, sedated HENT: /AT, eyes anicteric, pupils reactive Lungs: Synchronous with ventilator, CTA B. Cardiovascular: Minimally tachycardic, regular rhythm Abdomen: Soft, nontender, nondistended Extremities: No peripheral edema.  Left radial A-line with good distal perfusion Neuro: RASS -4, PERRL Derm: no rashes or wounds  CXR personally reviewed> right lower lobe infiltrate concerning for possible pneumonia.  ET tube about 2 cm above the carina.  Persistent stable pulmonary edema.    Resolved Hospital Problem list    Assessment & Plan:  Cardiogenic shock due to acute decompensated heart failure from ICM Acute inferior STEMI CTO LAD, CAD of LCx as well Pericarditis -Continue norepinephrine as required to maintain appropriate cardiac output and MAP greater than 65 -Daily aspirin, heparin infusion -Appreciate cardiology & TCTS management. -No steroids due to increased risk of LV rupture; con't colchicine. Additional 4mg  morphine now -Daily atorvastatin -Continue spironolactone -Unable to tolerate Bblocker and ACE or ARB due to shock -Continue diuresis per cardiology electrolyte management  Acute hypoxic respiratory failure due to cardiogenic pulmonary edema, likely also splinting due to pain -Continue low tidal volume ventilation, 4 to 8 cc/kg ideal body weight goal plateau's and 30 driving pressure less than 15 -Sedation is required to tolerate mechanical  ventilation and appropriate pain control-fentanyl, Precedex, Versed -Daily SAT and SBT when appropriate.  Need to make sure pain control is optimized to limit additional strain on his heart with severe  tachycardia.  Decision for timing of evaluation for extubation will be made with cardiology and TCTS. -VAP prevention protocol  Fevers due to pericarditis versus possible right lower lobe pneumonia -Continue empiric antibiotics -Continue aspirin BID -Scheduled Tylenol x 24 hours -Continue to follow cultures  Hyperglycemia-controlled -Accu-Cheks every 4 hours to sliding scale insulin. -Goal BG 140-180  Risk for malnutrition -Starting tube feeds today  Acute anemia, likely due to acute illness -Transfuse for hemoglobin less than 8 or hemodynamically significant bleeding -Continue to monitor  Best practice:  Diet: NPO Pain/Anxiety/Delirium protocol (if indicated): yes VAP protocol (if indicated): yes DVT prophylaxis: heparin GI prophylaxis: pantoprazole Glucose control: per primary Mobility: bedrest Code Status: full Family Communication: GF updated at bedside Disposition: ICU  Labs   CBC: Recent Labs  Lab 06/25/20 2323 06/26/20 0029 06/26/20 0150 06/26/20 1351 06/26/20 1352 06/27/20 0226 06/27/20 2011 06/28/20 0500 06/28/20 0521  WBC 29.5*  --  32.0*  --   --  25.5* 30.8* 27.3*  --   HGB 17.3*   < > 17.5*   < > 16.0 14.1 13.7 12.9* 13.3  HCT 53.4*   < > 54.5*   < > 47.0 43.6 42.2 39.5 39.0  MCV 91.1  --  92.5  --   --  90.6 89.8 90.0  --   PLT 322  --  343  --   --  212 243 231  --    < > = values in this interval not displayed.    Basic Metabolic Panel: Recent Labs  Lab 06/25/20 2323 06/26/20 0029 06/26/20 0150 06/26/20 1351 06/26/20 1352 06/27/20 0226 06/27/20 2011 06/28/20 0500 06/28/20 0521  NA 140   < > 140   < > 141 136 135 135 136  K 4.0   < > 4.1   < > 3.6 3.5 4.2 4.0 4.4  CL 102  --  103  --   --  100 100 101  --   CO2 23  --  22  --   --  24 23 24   --   GLUCOSE 138*  --  142*  --   --  112* 149* 151*  --   BUN 14  --  14  --   --  18 21* 20  --   CREATININE 1.02  --  1.00  --   --  1.06 1.17 1.08  --   CALCIUM 9.6  --  9.2  --   --  8.7*  8.1* 8.0*  --   MG  --   --   --   --   --   --   --  1.9  --    < > = values in this interval not displayed.   GFR: Estimated Creatinine Clearance: 80 mL/min (by C-G formula based on SCr of 1.08 mg/dL). Recent Labs  Lab 06/26/20 0150 06/27/20 0226 06/27/20 1106 06/27/20 1134 06/27/20 1544 06/27/20 1758 06/27/20 2011 06/27/20 2121 06/28/20 0500  PROCALCITON  --   --  1.21  --   --   --   --   --   --   WBC 32.0* 25.5*  --   --   --   --  30.8*  --  27.3*  LATICACIDVEN  --   --   --  1.2 1.5 1.2  --  1.2  --     Liver Function Tests: No results for input(s): AST, ALT, ALKPHOS, BILITOT, PROT, ALBUMIN in the last 168 hours. No results for input(s): LIPASE, AMYLASE in the last 168 hours. No results for input(s): AMMONIA in the last 168 hours.  ABG    Component Value Date/Time   PHART 7.416 06/28/2020 0521   PCO2ART 46.2 06/28/2020 0521   PO2ART 158 (H) 06/28/2020 0521   HCO3 29.5 (H) 06/28/2020 0521   TCO2 31 06/28/2020 0521   ACIDBASEDEF 1.0 06/26/2020 0029   O2SAT 99.0 06/28/2020 0521     Coagulation Profile: Recent Labs  Lab 06/25/20 2334  INR 1.2    Cardiac Enzymes: No results for input(s): CKTOTAL, CKMB, CKMBINDEX, TROPONINI in the last 168 hours.  HbA1C: Hgb A1c MFr Bld  Date/Time Value Ref Range Status  06/25/2020 11:34 PM 5.6 4.8 - 5.6 % Final    Comment:    (NOTE) Pre diabetes:          5.7%-6.4%  Diabetes:              >6.4%  Glycemic control for   <7.0% adults with diabetes     CBG: Recent Labs  Lab 06/27/20 2046 06/28/20 0024 06/28/20 0422 06/28/20 0809  GLUCAP 166* 168* 147* 137*     This patient is critically ill with multiple organ system failure which requires frequent high complexity decision making, assessment, support, evaluation, and titration of therapies. This was completed through the application of advanced monitoring technologies and extensive interpretation of multiple databases. During this encounter critical care time  was devoted to patient care services described in this note for 39 minutes.  Steffanie Dunn, DO 06/28/20 8:05 PM [facility]

## 2020-06-28 NOTE — Progress Notes (Signed)
PHARMACY CONSULT NOTE Pharmacy Consult for tirofiban>>heparin Indication: LV thrombus  No Known Allergies  Patient Measurements: Height: 5' 8" (172.7 cm) Weight: 70.2 kg (154 lb 12.2 oz) IBW/kg (Calculated) : 68.4  Vital Signs: Temp: 99.1 F (37.3 C) (10/04 1512) Temp Source: Core (10/04 0800) BP: 104/71 (10/04 1512) Pulse Rate: 91 (10/04 1512)  Labs: Recent Labs    06/25/20 2323 06/25/20 2334 06/26/20 0029 06/26/20 0150 06/26/20 1351 06/27/20 0226 06/27/20 0226 06/27/20 1106 06/27/20 2011 06/27/20 2011 06/27/20 2119 06/28/20 0500 06/28/20 0521  HGB 17.3*  --    < > 17.5*   < > 14.1   < >  --  13.7   < >  --  12.9* 13.3  HCT 53.4*  --    < > 54.5*   < > 43.6   < >  --  42.2  --   --  39.5 39.0  PLT 322  --    < > 343  --  212  --   --  243  --   --  231  --   APTT  --  30  --   --   --   --   --   --   --   --   --   --   --   LABPROT  --  14.4  --   --   --   --   --   --   --   --   --   --   --   INR  --  1.2  --   --   --   --   --   --   --   --   --   --   --   HEPARINUNFRC  --   --   --   --   --  <0.10*   < > 0.10*  --   --  0.17* 0.30  --   CREATININE 1.02  --    < > 1.00  --  1.06  --   --  1.17  --   --  1.08  --   TROPONINIHS >27,000*  --   --  >27,000*  --   --   --   --   --   --   --   --   --    < > = values in this interval not displayed.    Estimated Creatinine Clearance: 80 mL/min (by C-G formula based on SCr of 1.08 mg/dL).   Medical History: History reviewed. No pertinent past medical history.  Medications:  Medications Prior to Admission  Medication Sig Dispense Refill Last Dose  . ALPRAZolam (XANAX) 0.5 MG tablet Take 0.5 mg by mouth at bedtime as needed for sleep.    06/24/2020  . Multiple Vitamins-Minerals (CENTRUM ULTRA MENS) TABS Take 1 tablet by mouth.   06/24/2020  . NARCAN 4 MG/0.1ML LIQD nasal spray kit Place 1 spray into the nose once.    unknown  . traZODone (DESYREL) 100 MG tablet Take 100 mg by mouth at bedtime.   06/24/2020     Assessment: 49 y.o. M presented as STEMI - s/p angioplasty. Cardiac surgery consulted for consideration of multivessel CABG - once more stable. Tirofiban started on admission. Patient became more uncomfortable, concern for early cardiogenic shock. Stat echo showed LV thrombus, taken to cath lab for possible mechanical support. Good cardiac output on relook, no balloon pump placed, starting inotropes.     Heparin level 0.3 therapeutic on heparin drip rate increase 1800 units/hr, now intubated, no bleeding observed, h/h stable  Plan:  Continue heparin drip 1800 units/hr Daily HL, CBC      Pharm.D. CPP, BCPS Clinical Pharmacist [phone] 06/28/2020 3:38 PM      

## 2020-06-28 NOTE — Plan of Care (Signed)
  Problem: Education: Goal: Understanding of cardiac disease, CV risk reduction, and recovery process will improve Outcome: Progressing Goal: Understanding of medication regimen will improve Outcome: Progressing Goal: Individualized Educational Video(s) Outcome: Progressing   Problem: Cardiac: Goal: Ability to achieve and maintain adequate cardiopulmonary perfusion will improve Outcome: Progressing Goal: Vascular access site(s) Level 0-1 will be maintained Outcome: Progressing   Problem: Clinical Measurements: Goal: Ability to maintain clinical measurements within normal limits will improve Outcome: Progressing Goal: Will remain free from infection Outcome: Progressing Goal: Diagnostic test results will improve Outcome: Progressing Goal: Respiratory complications will improve Outcome: Progressing Goal: Cardiovascular complication will be avoided Outcome: Progressing   Problem: Coping: Goal: Level of anxiety will decrease Outcome: Progressing   Problem: Elimination: Goal: Will not experience complications related to bowel motility Outcome: Progressing Goal: Will not experience complications related to urinary retention Outcome: Progressing   Problem: Pain Managment: Goal: General experience of comfort will improve Outcome: Progressing   Problem: Safety: Goal: Ability to remain free from injury will improve Outcome: Progressing   Problem: Skin Integrity: Goal: Risk for impaired skin integrity will decrease Outcome: Progressing

## 2020-06-28 NOTE — Progress Notes (Signed)
Neurosurgery Service Progress Note  Subjective: intubated yesterday, no events since then   Objective: Vitals:   06/28/20 0628 06/28/20 0735 06/28/20 0736 06/28/20 0800  BP:  107/72  111/74  Pulse: (!) 101 (!) 101  (!) 101  Resp: (!) 0 20  20  Temp: 99.9 F (37.7 C) 100 F (37.8 C)  100.2 F (37.9 C)  TempSrc:      SpO2: 100% 100% 100% 100%  Weight:      Height:       Temp (24hrs), Avg:100.5 F (38.1 C), Min:99.5 F (37.5 C), Max:102.4 F (39.1 C)  CBC Latest Ref Rng & Units 06/28/2020 06/28/2020 06/27/2020  WBC 4.0 - 10.5 K/uL - 27.3(H) 30.8(H)  Hemoglobin 13.0 - 17.0 g/dL 13.3 12.9(L) 13.7  Hematocrit 39 - 52 % 39.0 39.5 42.2  Platelets 150 - 400 K/uL - 231 243   BMP Latest Ref Rng & Units 06/28/2020 06/28/2020 06/27/2020  Glucose 70 - 99 mg/dL - 151(H) 149(H)  BUN 6 - 20 mg/dL - 20 21(H)  Creatinine 0.61 - 1.24 mg/dL - 1.08 1.17  Sodium 135 - 145 mmol/L 136 135 135  Potassium 3.5 - 5.1 mmol/L 4.4 4.0 4.2  Chloride 98 - 111 mmol/L - 101 100  CO2 22 - 32 mmol/L - 24 23  Calcium 8.9 - 10.3 mg/dL - 8.0(L) 8.1(L)    Intake/Output Summary (Last 24 hours) at 06/28/2020 0906 Last data filed at 06/28/2020 0610 Gross per 24 hour  Intake 1783.69 ml  Output 3350 ml  Net -1566.31 ml    Current Facility-Administered Medications:  .  0.9 %  sodium chloride infusion, , Intravenous, Continuous, Cardama, Grayce Sessions, MD, Stopped at 06/27/20 1726 .  0.9 %  sodium chloride infusion, 250 mL, Intravenous, PRN, Sherren Mocha, MD, Last Rate: 10 mL/hr at 06/26/20 0700, Rate Verify at 06/26/20 0700 .  acetaminophen (TYLENOL) 160 MG/5ML solution 500 mg, 500 mg, Per Tube, Q6H, Julian Hy, DO .  [START ON 06/29/2020] acetaminophen (TYLENOL) tablet 650 mg, 650 mg, Per Tube, Q6H PRN, Julian Hy, DO .  aspirin tablet 325 mg, 325 mg, Per Tube, BID, Sherren Mocha, MD .  atorvastatin (LIPITOR) tablet 80 mg, 80 mg, Per Tube, Daily, Sherren Mocha, MD .  ceFEPIme (MAXIPIME) 2 g in sodium  chloride 0.9 % 100 mL IVPB, 2 g, Intravenous, Q8H, Lyndee Leo, RPH, Last Rate: 200 mL/hr at 06/28/20 0610, 2 g at 06/28/20 0610 .  chlorhexidine gluconate (MEDLINE KIT) (PERIDEX) 0.12 % solution 15 mL, 15 mL, Mouth Rinse, BID, Kamat, Sunil G, MD .  Chlorhexidine Gluconate Cloth 2 % PADS 6 each, 6 each, Topical, Daily, Sherren Mocha, MD, 6 each at 06/26/20 1830 .  colchicine tablet 0.6 mg, 0.6 mg, Per Tube, BID, Sherren Mocha, MD, 0.6 mg at 06/27/20 2301 .  dexmedetomidine (PRECEDEX) 400 MCG/100ML (4 mcg/mL) infusion, 0.4-1.2 mcg/kg/hr, Intravenous, Titrated, Julian Hy, DO, Last Rate: 7.62 mL/hr at 06/28/20 0600, 0.4 mcg/kg/hr at 06/28/20 0600 .  digoxin (LANOXIN) tablet 0.125 mg, 0.125 mg, Per Tube, Daily, Sherren Mocha, MD .  docusate (COLACE) 50 MG/5ML liquid 100 mg, 100 mg, Per Tube, BID, Sherren Mocha, MD, 100 mg at 06/27/20 2203 .  fentaNYL (SUBLIMAZE) bolus via infusion 50 mcg, 50 mcg, Intravenous, Q15 min PRN, Julian Hy, DO, 50 mcg at 06/27/20 1931 .  fentaNYL 2527mcg in NS 248mL (66mcg/ml) infusion-PREMIX, 50-400 mcg/hr, Intravenous, Continuous, Julian Hy, DO, Last Rate: 40 mL/hr at 06/28/20 0733, 400 mcg/hr at 06/28/20  0733 .  furosemide (LASIX) injection 40 mg, 40 mg, Intravenous, Once, Larey Dresser, MD .  heparin ADULT infusion 100 units/mL (25000 units/230mL sodium chloride 0.45%), 1,800 Units/hr, Intravenous, Continuous, Bertis Ruddy, RPH, Last Rate: 18 mL/hr at 06/28/20 0600, 1,800 Units/hr at 06/28/20 0600 .  magnesium sulfate IVPB 2 g 50 mL, 2 g, Intravenous, Once, Clark, Laura P, DO .  MEDLINE mouth rinse, 15 mL, Mouth Rinse, 10 times per day, Margaretmary Lombard, MD, 15 mL at 06/28/20 0641 .  midazolam (VERSED) 50 mg/50 mL (1 mg/mL) premix infusion, 0-10 mg/hr, Intravenous, Continuous, Kamat, Sunil G, MD, Last Rate: 2 mL/hr at 06/28/20 0600, 2 mg/hr at 06/28/20 0600 .  midazolam (VERSED) bolus via infusion 1-2 mg, 1-2 mg, Intravenous, Q2H PRN, Kamat,  Sunil G, MD .  milrinone (PRIMACOR) 20 MG/100 ML (0.2 mg/mL) infusion, 0.375 mcg/kg/min, Intravenous, Continuous, Larey Dresser, MD, Last Rate: 8.58 mL/hr at 06/28/20 0735, 0.375 mcg/kg/min at 06/28/20 0735 .  nitroGLYCERIN (NITROSTAT) SL tablet 0.4 mg, 0.4 mg, Sublingual, Q5 Min x 3 PRN, Cardama, Grayce Sessions, MD, 0.4 mg at 06/25/20 2342 .  norepinephrine (LEVOPHED) 16 mg in 236mL premix infusion, 0-40 mcg/min, Intravenous, Titrated, Sherren Mocha, MD, Last Rate: 15 mL/hr at 06/28/20 0600, 16 mcg/min at 06/28/20 0600 .  ondansetron (ZOFRAN) injection 4 mg, 4 mg, Intravenous, Q6H PRN, Rudean Curt, MD .  pantoprazole (PROTONIX) injection 40 mg, 40 mg, Intravenous, Q24H, Julian Hy, DO, 40 mg at 06/27/20 2200 .  polyethylene glycol (MIRALAX / GLYCOLAX) packet 17 g, 17 g, Per Tube, Daily, Sherren Mocha, MD .  potassium chloride (KLOR-CON) packet 20 mEq, 20 mEq, Per Tube, Once, Larey Dresser, MD .  sodium chloride flush (NS) 0.9 % injection 3 mL, 3 mL, Intravenous, Q12H, Sherren Mocha, MD, 3 mL at 06/27/20 2158 .  sodium chloride flush (NS) 0.9 % injection 3 mL, 3 mL, Intravenous, PRN, Sherren Mocha, MD .  vancomycin (VANCOCIN) IVPB 1000 mg/200 mL premix, 1,000 mg, Intravenous, Q12H, Lyndee Leo, [facility]   Physical Exam: Intubated, eyes to voice and FC x4 equally, incision w/ small superficial dehiscence superiorly, no erythema/induration/discharge  Assessment & Plan: 49 y.o. man s/p repeat far lateral MIS discectomy 9/29, then STEMI s/p PTCA with low EF and apical thrombus.  -agree with local wound care for superficial dehiscence. No need for prophylactic antibiotics for his surgical wound from my standpoint, benefits outweigh risks of anticoagulation / antiplatelets as needed, as his cardiac disease appears to be very severe with cardiogenic shock, respiratory failure. I do not see any concerning findings on his wound that would explain his fever, especially this early  in his post-operative course.  Joyice Faster   06/28/20 9:06 AM

## 2020-06-28 NOTE — Progress Notes (Signed)
eLink Physician-Brief Progress Note Patient Name: Jerry [name] DOB: July 20, 1971 MRN: [id number]   Date of Service  06/28/2020  HPI/Events of Note  Agitation - Request to renew bilateral soft wrist restraints.   eICU Interventions  Will renew bilateral soft wrist restraints. X 12 hours.      Intervention Category Major Interventions: Delirium, psychosis, severe agitation - evaluation and management  , Eugene 06/28/2020, 8:23 PM

## 2020-06-28 NOTE — Progress Notes (Signed)
1 Day Post-Op Procedure(s): INVASIVE LAB ABORTED CASE Subjective: Intubated yesterday for pulmonary edema, pain control  Objective: Vital signs in last 24 hours: Temp:  [99.3 F (37.4 C)-102.4 F (39.1 C)] 99.3 F (37.4 C) (10/04 1400) Pulse Rate:  [94-158] 94 (10/04 1400) Cardiac Rhythm: Sinus tachycardia (10/04 1200) Resp:  [0-32] 20 (10/04 1400) BP: (77-116)/(55-76) 103/69 (10/04 1400) SpO2:  [89 %-100 %] 100 % (10/04 1400) FiO2 (%):  [35 %-70 %] 35 % (10/04 1135) Weight:  [70.2 kg] 70.2 kg (10/04 0500)  Hemodynamic parameters for last 24 hours: PAP: (21-51)/(16-36) 33/22 CVP:  [0 mmHg-14 mmHg] 5 mmHg CO:  [5.3 L/min-6 L/min] 5.7 L/min CI:  [2.8 L/min/m2-3.2 L/min/m2] 3 L/min/m2  Intake/Output from previous day: 10/03 0701 - 10/04 0700 In: 1783.7 [I.V.:1445; IV Piggyback:338.7] Out: 3350 [Urine:3050; Emesis/NG output:300] Intake/Output this shift: Total I/O In: 1164.1 [I.V.:717.4; IV Piggyback:446.7] Out: 1350 [Urine:1350]  General appearance: intubated Heart: Tachy, regular, rub less prominent Lungs: clear anteriorly  Lab Results: Recent Labs    06/27/20 2011 06/27/20 2011 06/28/20 0500 06/28/20 0521  WBC 30.8*  --  27.3*  --   HGB 13.7   < > 12.9* 13.3  HCT 42.2   < > 39.5 39.0  PLT 243  --  231  --    < > = values in this interval not displayed.   BMET:  Recent Labs    06/27/20 2011 06/27/20 2011 06/28/20 0500 06/28/20 0521  NA 135   < > 135 136  K 4.2   < > 4.0 4.4  CL 100  --  101  --   CO2 23  --  24  --   GLUCOSE 149*  --  151*  --   BUN 21*  --  20  --   CREATININE 1.17  --  1.08  --   CALCIUM 8.1*  --  8.0*  --    < > = values in this interval not displayed.    PT/INR:  Recent Labs    06/25/20 2334  LABPROT 14.4  INR 1.2   ABG    Component Value Date/Time   PHART 7.416 06/28/2020 0521   HCO3 29.5 (H) 06/28/2020 0521   TCO2 31 06/28/2020 0521   ACIDBASEDEF 1.0 06/26/2020 0029   O2SAT 99.0 06/28/2020 0521   CBG (last 3)   Recent Labs    06/28/20 0422 06/28/20 0809 06/28/20 1240  GLUCAP 147* 137* 170*    Assessment/Plan: S/P Procedure(s): INVASIVE LAB ABORTED CASE -s/p STEMI with acutely occluded RCA, CTo of LAD and severe circumflex disease EF ~ 25% LV thrlombus on initial echo but not seen yesterday  Ernestine Conrad shows good cardiac index. PA pressures improved dramatically with intubation and diuresis.  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 Will need a repeat echo, possible viability assessment prior to making a final determination.  LOS: 3 days    Loreli Slot 06/28/2020

## 2020-06-28 NOTE — Progress Notes (Signed)
Initial Nutrition Assessment  DOCUMENTATION CODES:   Non-severe (moderate) malnutrition in context of acute illness/injury  INTERVENTION:   Initiate tube feeding via OG tube: Vital 1.5 at 55 ml/h (1320 ml per day) Prosource TF 45 ml BID  Provides 2060 kcal, 111 gm protein, 1008 ml free water daily  NUTRITION DIAGNOSIS:   Moderate Malnutrition related to acute illness (recent back surgery) as evidenced by mild muscle depletion, moderate muscle depletion, mild fat depletion, moderate fat depletion.  GOAL:   Patient will meet greater than or equal to 90% of their needs  MONITOR:   Vent status, TF tolerance, Skin, Labs  REASON FOR ASSESSMENT:   Ventilator, Consult Enteral/tube feeding initiation and management  ASSESSMENT:   49 yo male admitted with STEMI with LV thrombus. S/P aspiration thrombectomy and balloon angioplasty 10/1. No significant PMH.    Patient developed tachycardia, hypoxemia, and discomfort from post-MI pericarditis. Required intubation 10/3. Discussed patient in ICU rounds and with RN today. He will likely receive an impella this week. Okay to begin TF via OG tube per discussion with CCM physician.  Patient is currently intubated on ventilator support MV: 10.5 L/min Temp (24hrs), Avg:100.5 F (38.1 C), Min:99.5 F (37.5 C), Max:102.4 F (39.1 C)   Labs reviewed. Mag 1.9 CBG: 147-137-170  Medications reviewed and include mag sulfate, colace, novolog, miralax, precedex, fentanyl, versed, milrinone, levophed.  Weight down to 70.2 kg today from 76.3 kg on 10/2 Unsure of usual weight.  I/O -3.3 L since admission UOP 3,050 ml x 24 hr  Patient with multiple areas of muscle and fat depletion. Unsure etiology, but could be related to decreased activity/movement after recent back surgery.   NUTRITION - FOCUSED PHYSICAL EXAM:    Most Recent Value  Orbital Region Moderate depletion  Upper Arm Region Mild depletion  Thoracic and Lumbar Region No  depletion  Buccal Region Unable to assess  Temple Region Moderate depletion  Clavicle Bone Region Moderate depletion  Clavicle and Acromion Bone Region Moderate depletion  Scapular Bone Region Mild depletion  Dorsal Hand Mild depletion  Patellar Region Mild depletion  Anterior Thigh Region Mild depletion  Posterior Calf Region Moderate depletion  Edema (RD Assessment) None  Hair Reviewed  Eyes Unable to assess  Mouth Unable to assess  Skin Reviewed  Nails Reviewed       Diet Order:   Diet Order            Diet NPO time specified  Diet effective now                  EDUCATION NEEDS:   Not appropriate for education at this time  Skin:  Skin Assessment: Skin Integrity Issues: Skin Integrity Issues:: Incisions Incisions: open vertebral column from recent back surgery  Last BM:  9/29  Height:   Ht Readings from Last 1 Encounters:  06/25/20 5\' 8"  (1.727 m)    Weight:   Wt Readings from Last 1 Encounters:  06/28/20 70.2 kg    Ideal Body Weight:  70 kg  BMI:  Body mass index is 23.53 kg/m.  Estimated Nutritional Needs:   Kcal:  2126  Protein:  100-120 gm  Fluid:  >/= 2 L    2127, RD, LDN, CNSC Please refer to Amion for contact information.

## 2020-06-29 ENCOUNTER — Inpatient Hospital Stay (HOSPITAL_COMMUNITY): Payer: 59

## 2020-06-29 ENCOUNTER — Encounter (HOSPITAL_COMMUNITY): Payer: Self-pay | Admitting: Anesthesiology

## 2020-06-29 DIAGNOSIS — Z9911 Dependence on respirator [ventilator] status: Secondary | ICD-10-CM | POA: Diagnosis not present

## 2020-06-29 DIAGNOSIS — R570 Cardiogenic shock: Secondary | ICD-10-CM | POA: Diagnosis not present

## 2020-06-29 DIAGNOSIS — I25110 Atherosclerotic heart disease of native coronary artery with unstable angina pectoris: Secondary | ICD-10-CM

## 2020-06-29 DIAGNOSIS — J181 Lobar pneumonia, unspecified organism: Secondary | ICD-10-CM

## 2020-06-29 DIAGNOSIS — J9601 Acute respiratory failure with hypoxia: Secondary | ICD-10-CM | POA: Diagnosis not present

## 2020-06-29 DIAGNOSIS — E440 Moderate protein-calorie malnutrition: Secondary | ICD-10-CM | POA: Insufficient documentation

## 2020-06-29 DIAGNOSIS — I2111 ST elevation (STEMI) myocardial infarction involving right coronary artery: Secondary | ICD-10-CM | POA: Diagnosis not present

## 2020-06-29 LAB — CBC
HCT: 36.2 % — ABNORMAL LOW (ref 39.0–52.0)
Hemoglobin: 11.5 g/dL — ABNORMAL LOW (ref 13.0–17.0)
MCH: 29.3 pg (ref 26.0–34.0)
MCHC: 31.8 g/dL (ref 30.0–36.0)
MCV: 92.1 fL (ref 80.0–100.0)
Platelets: 173 10*3/uL (ref 150–400)
RBC: 3.93 MIL/uL — ABNORMAL LOW (ref 4.22–5.81)
RDW: 12.8 % (ref 11.5–15.5)
WBC: 14.6 10*3/uL — ABNORMAL HIGH (ref 4.0–10.5)
nRBC: 0 % (ref 0.0–0.2)

## 2020-06-29 LAB — POCT I-STAT 7, (LYTES, BLD GAS, ICA,H+H)
Acid-Base Excess: 6 mmol/L — ABNORMAL HIGH (ref 0.0–2.0)
Bicarbonate: 32.3 mmol/L — ABNORMAL HIGH (ref 20.0–28.0)
Calcium, Ion: 1.12 mmol/L — ABNORMAL LOW (ref 1.15–1.40)
HCT: 48 % (ref 39.0–52.0)
Hemoglobin: 16.3 g/dL (ref 13.0–17.0)
O2 Saturation: 97 %
Patient temperature: 37.9
Potassium: 4.2 mmol/L (ref 3.5–5.1)
Sodium: 137 mmol/L (ref 135–145)
TCO2: 34 mmol/L — ABNORMAL HIGH (ref 22–32)
pCO2 arterial: 53.2 mmHg — ABNORMAL HIGH (ref 32.0–48.0)
pH, Arterial: 7.396 (ref 7.350–7.450)
pO2, Arterial: 100 mmHg (ref 83.0–108.0)

## 2020-06-29 LAB — MAGNESIUM
Magnesium: 2.3 mg/dL (ref 1.7–2.4)
Magnesium: 2 mg/dL (ref 1.7–2.4)

## 2020-06-29 LAB — BASIC METABOLIC PANEL
Anion gap: 9 (ref 5–15)
BUN: 20 mg/dL (ref 6–20)
CO2: 26 mmol/L (ref 22–32)
Calcium: 7.9 mg/dL — ABNORMAL LOW (ref 8.9–10.3)
Chloride: 101 mmol/L (ref 98–111)
Creatinine, Ser: 0.77 mg/dL (ref 0.61–1.24)
GFR calc Af Amer: >60 mL/min (ref >60)
GFR calc non Af Amer: >60 mL/min (ref >60)
Glucose, Bld: 159 mg/dL — ABNORMAL HIGH (ref 70–99)
Potassium: 4.4 mmol/L (ref 3.5–5.1)
Sodium: 136 mmol/L (ref 135–145)

## 2020-06-29 LAB — COOXEMETRY PANEL
Carboxyhemoglobin: 1.3 % (ref 0.5–1.5)
Methemoglobin: 0.8 % (ref 0.0–1.5)
O2 Saturation: 63.4 %
Total hemoglobin: 15.3 g/dL (ref 12.0–16.0)

## 2020-06-29 LAB — GLUCOSE, CAPILLARY
Glucose-Capillary: 106 mg/dL — ABNORMAL HIGH (ref 70–99)
Glucose-Capillary: 115 mg/dL — ABNORMAL HIGH (ref 70–99)
Glucose-Capillary: 128 mg/dL — ABNORMAL HIGH (ref 70–99)
Glucose-Capillary: 152 mg/dL — ABNORMAL HIGH (ref 70–99)
Glucose-Capillary: 152 mg/dL — ABNORMAL HIGH (ref 70–99)
Glucose-Capillary: 180 mg/dL — ABNORMAL HIGH (ref 70–99)

## 2020-06-29 LAB — HEPARIN LEVEL (UNFRACTIONATED): Heparin Unfractionated: 0.19 IU/mL — ABNORMAL LOW (ref 0.30–0.70)

## 2020-06-29 LAB — PROCALCITONIN: Procalcitonin: 0.94 ng/mL

## 2020-06-29 LAB — PHOSPHORUS
Phosphorus: 2.4 mg/dL — ABNORMAL LOW (ref 2.5–4.6)
Phosphorus: 1.7 mg/dL — ABNORMAL LOW (ref 2.5–4.6)

## 2020-06-29 MED ORDER — POTASSIUM & SODIUM PHOSPHATES 280-160-250 MG PO PACK
2.0000 | PACK | ORAL | Status: AC
  Administered 2020-06-29 – 2020-06-30 (×2): 2
  Filled 2020-06-29 (×3): qty 2

## 2020-06-29 MED ORDER — IVABRADINE HCL 5 MG PO TABS
5.0000 mg | ORAL_TABLET | Freq: Two times a day (BID) | ORAL | Status: DC
  Administered 2020-06-29 – 2020-07-01 (×2): 5 mg
  Filled 2020-06-29 (×4): qty 1

## 2020-06-29 MED ORDER — FUROSEMIDE 10 MG/ML IJ SOLN
40.0000 mg | Freq: Two times a day (BID) | INTRAMUSCULAR | Status: AC
  Administered 2020-06-29 (×2): 40 mg via INTRAVENOUS
  Filled 2020-06-29 (×3): qty 4

## 2020-06-29 MED ORDER — IVABRADINE HCL 5 MG PO TABS
5.0000 mg | ORAL_TABLET | Freq: Two times a day (BID) | ORAL | Status: DC
  Filled 2020-06-29: qty 1

## 2020-06-29 MED ORDER — POTASSIUM CHLORIDE 20 MEQ PO PACK
40.0000 meq | PACK | Freq: Once | ORAL | Status: AC
  Administered 2020-06-29: 40 meq
  Filled 2020-06-29: qty 2

## 2020-06-29 MED ORDER — FUROSEMIDE 10 MG/ML IJ SOLN: 40.0000 mg | Freq: Once | INTRAMUSCULAR | Status: DC

## 2020-06-29 NOTE — Progress Notes (Signed)
NAME:  Jerry [name], MRN:  [id number], DOB:  [birth year], LOS: 4 ADMISSION DATE:  06/25/2020, CONSULTATION DATE:  10/3 REFERRING MD:  Jerry [name], CHIEF COMPLAINT:  hypoxia   Brief History   STEMI with LV thrombus, pericarditis  History of present illness   Jerry [name] is a 49 y/o gentleman who presented on 10/1 with chest pain found to have an acute inferior STEMI. He was found to have acute distal RCA occlusion treated with aspiration thrombectomy and balloon angioplasty. He has a CTO LAD with collaterals, severe complex stenossi of the proximal LCx, and severe global and segmental LV dysfunction with elevated LVEDP. His course has been complicated by respiratory failure due to pulmonary edema, pericarditis, and LV thrombus. Today he has had progressive tachycardia, hypotension, and ongoing severe anterior chest pain that is only improved with leaning forward. He has had colchicine, aspirin, and morphine without significant relief in his chest pain. Never smoker, no previous PMH.  Past Medical History    Significant Hospital Events   LHC with aspiration thrombectomy and balloon angioplasty on 10/1  Consults:  PCCM Cardiology  Procedures:  LHC 10/1  Significant Diagnostic Tests:  Echo: LVEF 25-30%, LV thrombus,   Micro Data:  Blood cx 10/3> MRSa negative covid negative  Antimicrobials:  Cefepime 10/3> Bactrim 10/3> vancomycin 10/3>  Interim history/subjective:  Agitated this morning, but awake and reports pain is moderately controlled.  Objective   Blood pressure 92/67, pulse 89, temperature 100 F (37.8 C), resp. rate 20, height 5\' 8"  (1.727 m), weight 76.7 kg, SpO2 99 %. PAP: (19-76)/(13-39) 28/17 CVP:  [0 mmHg-15 mmHg] 2 mmHg CO:  [5.2 L/min-6.5 L/min] 5.2 L/min CI:  [2.7 L/min/m2-3.4 L/min/m2] 2.7 L/min/m2  Vent Mode: PRVC FiO2 (%):  [30 %] 30 % Set Rate:  [20 bmp] 20 bmp Vt Set:  [540 mL] 540 mL PEEP:  [5 cmH20-8 cmH20] 5 cmH20 Pressure Support:  [5 cmH20] 5  cmH20 Plateau Pressure:  [11 cmH20-17 cmH20] 13 cmH20   Intake/Output Summary (Last 24 hours) at 06/29/2020 1758 Last data filed at 06/29/2020 1600 Gross per 24 hour  Intake 3456.86 ml  Output 2100 ml  Net 1356.86 ml   Filed Weights   06/27/20 0500 06/28/20 0500 06/29/20 0316  Weight: 76.2 kg 70.2 kg 76.7 kg    Examination: General: Ill-appearing man sitting up in bed HENT: [place]/AT, eyes anicteric, ETT in place Lungs: Rhonchi bilaterally, purulent secretions from ET tube.  Tachypneic, synchronous with the vent Cardiovascular: Tachycardic, regular rhythm Abdomen: Soft, nontender, nondistended Extremities: No peripheral edema, left radial A-line with good distal perfusion. Neuro: RASS +1, lower extremities spontaneously, comprehension intact.  Communicating by writing. Derm: No wounds or rashes  CXR personally reviewed> persistent right lower lobe opacity.   Resolved Hospital Problem list    Assessment & Plan:  Cardiogenic shock due to acute decompensated heart failure from ICM Acute inferior STEMI CTO LAD, CAD of LCx as well Pericarditis -Continue norepinephrine and milrinone as required to maintain appropriate cardiac output and MAP greater than 65. -I breathing to help manage tachycardia -Continue heparin infusion -Continue daily aspirin -Potentially going to the OR later this week for CABG. -No steroids due to increased risk of LV rupture; con't colchicine -Daily atorvastatin -holding spironolactone -Unable to tolerate Bblocker and ACE or ARB due to shock -Continue diuresis per cardiology   Acute hypoxic respiratory failure due to cardiogenic pulmonary edema RLL pneumonia -Respiratory culture sent today -Continue broad-spectrum antibiotics -Continue low tidal volume ventilation, 4-8 cc/kg ideal body  weight goal plateau less than 30 and driving pressure less than 15. -Sedation as required to tolerate mechanical ventilation.  Precedex, Versed, and fentanyl -Daily SAT  and SBT when appropriate.  Passed SBT for mechanics work of breathing, with increased secretions and increasing agitation and concern about his ability to maintain his ventilation with pain medications discontinued and need to clear secretions on his own. -VAP prevention protocol  Hyperglycemia-controlled -Accu-Cheks every 4 hours to sliding scale insulin. -Goal BG 140-180  Risk for malnutrition -Continue tube feeds today  Hypophosphatemia -Repleted -Continue to monitor  Acute anemia, likely due to acute illness -Transfuse for hemoglobin less than 8 or hemodynamically significant bleeding -Continue to monitor  Best practice:  Diet: NPO Pain/Anxiety/Delirium protocol (if indicated): yes VAP protocol (if indicated): yes DVT prophylaxis: heparin GI prophylaxis: pantoprazole Glucose control: per primary Mobility: bedrest Code Status: full Family Communication: mother updated at bedside Disposition: ICU  Labs   CBC: Recent Labs  Lab 06/26/20 0150 06/26/20 1351 06/27/20 0226 06/27/20 0226 06/27/20 2011 06/28/20 0500 06/28/20 0521 06/29/20 0436 06/29/20 0458  WBC 32.0*  --  25.5*  --  30.8* 27.3*  --  14.6*  --   HGB 17.5*   < > 14.1   < > 13.7 12.9* 13.3 11.5* 16.3  HCT 54.5*   < > 43.6   < > 42.2 39.5 39.0 36.2* 48.0  MCV 92.5  --  90.6  --  89.8 90.0  --  92.1  --   PLT 343  --  212  --  243 231  --  173  --    < > = values in this interval not displayed.    Basic Metabolic Panel: Recent Labs  Lab 06/26/20 0150 06/26/20 1351 06/27/20 0226 06/27/20 0226 06/27/20 2011 06/28/20 0500 06/28/20 0521 06/28/20 1614 06/29/20 0436 06/29/20 0458 06/29/20 1649  NA 140   < > 136   < > 135 135 136  --  136 137  --   K 4.1   < > 3.5   < > 4.2 4.0 4.4  --  4.4 4.2  --   CL 103  --  100  --  100 101  --   --  101  --   --   CO2 22  --  24  --  23 24  --   --  26  --   --   GLUCOSE 142*  --  112*  --  149* 151*  --   --  159*  --   --   BUN 14  --  18  --  21* 20  --    --  20  --   --   CREATININE 1.00  --  1.06  --  1.17 1.08  --   --  0.77  --   --   CALCIUM 9.2  --  8.7*  --  8.1* 8.0*  --   --  7.9*  --   --   MG  --   --   --   --   --  1.9  --  2.5* 2.3  --  2.0  PHOS  --   --   --   --   --   --   --  2.2* 2.4*  --  1.7*   < > = values in this interval not displayed.   GFR: Estimated Creatinine Clearance: 108.1 mL/min (by C-G formula based on SCr of 0.77 mg/dL). Recent Labs  Lab 06/27/20 0226 06/27/20 1106 06/27/20 1134 06/27/20 1544 06/27/20 1758 06/27/20 2011 06/27/20 2121 06/28/20 0500 06/29/20 0436 06/29/20 1555  PROCALCITON  --  1.21  --   --   --   --   --   --   --  0.94  WBC 25.5*  --   --   --   --  30.8*  --  27.3* 14.6*  --   LATICACIDVEN  --   --  1.2 1.5 1.2  --  1.2  --   --   --     Liver Function Tests: No results for input(s): AST, ALT, ALKPHOS, BILITOT, PROT, ALBUMIN in the last 168 hours. No results for input(s): LIPASE, AMYLASE in the last 168 hours. No results for input(s): AMMONIA in the last 168 hours.  ABG    Component Value Date/Time   PHART 7.396 06/29/2020 0458   PCO2ART 53.2 (H) 06/29/2020 0458   PO2ART 100 06/29/2020 0458   HCO3 32.3 (H) 06/29/2020 0458   TCO2 34 (H) 06/29/2020 0458   ACIDBASEDEF 1.0 06/26/2020 0029   O2SAT 97.0 06/29/2020 0458     Coagulation Profile: Recent Labs  Lab 06/25/20 2334  INR 1.2    Cardiac Enzymes: No results for input(s): CKTOTAL, CKMB, CKMBINDEX, TROPONINI in the last 168 hours.  HbA1C: Hgb A1c MFr Bld  Date/Time Value Ref Range Status  06/25/2020 11:34 PM 5.6 4.8 - 5.6 % Final    Comment:    (NOTE) Pre diabetes:          5.7%-6.4%  Diabetes:              >6.4%  Glycemic control for   <7.0% adults with diabetes     CBG: Recent Labs  Lab 06/28/20 2338 06/29/20 0335 06/29/20 0750 06/29/20 1117 06/29/20 1527  GLUCAP 186* 152* 128* 106* 115*     This patient is critically ill with multiple organ system failure which requires frequent high  complexity decision making, assessment, support, evaluation, and titration of therapies. This was completed through the application of advanced monitoring technologies and extensive interpretation of multiple databases. During this encounter critical care time was devoted to patient care services described in this note for 47 minutes.  Steffanie Dunn, DO 06/29/20 6:08 PM [facility]

## 2020-06-29 NOTE — Progress Notes (Addendum)
Neurosurgery Service Progress Note  Subjective: No new complaints today.  Objective: Vitals:   06/29/20 1745 06/29/20 1800 06/29/20 1815 06/29/20 1830  BP: 98/73 101/73 100/73 99/72  Pulse: 84 84 85 85  Resp: $Remo'20 20 20 20  'NemWh$ Temp: 99.3 F (37.4 C) 99.3 F (37.4 C) 99.1 F (37.3 C) 99.1 F (37.3 C)  TempSrc:      SpO2: 99% 99% 99% 99%  Weight:      Height:       Temp (24hrs), Avg:100.2 F (37.9 C), Min:99.1 F (37.3 C), Max:101.7 F (38.7 C)  CBC Latest Ref Rng & Units 06/29/2020 06/29/2020 06/28/2020  WBC 4.0 - 10.5 K/uL - 14.6(H) -  Hemoglobin 13.0 - 17.0 g/dL 16.3 11.5(L) 13.3  Hematocrit 39 - 52 % 48.0 36.2(L) 39.0  Platelets 150 - 400 K/uL - 173 -   BMP Latest Ref Rng & Units 06/29/2020 06/29/2020 06/28/2020  Glucose 70 - 99 mg/dL - 159(H) -  BUN 6 - 20 mg/dL - 20 -  Creatinine 0.61 - 1.24 mg/dL - 0.77 -  Sodium 135 - 145 mmol/L 137 136 136  Potassium 3.5 - 5.1 mmol/L 4.2 4.4 4.4  Chloride 98 - 111 mmol/L - 101 -  CO2 22 - 32 mmol/L - 26 -  Calcium 8.9 - 10.3 mg/dL - 7.9(L) -    Intake/Output Summary (Last 24 hours) at 06/29/2020 1921 Last data filed at 06/29/2020 1835 Gross per 24 hour  Intake 3546.74 ml  Output 2735 ml  Net 811.74 ml    Current Facility-Administered Medications:  .  0.9 %  sodium chloride infusion, , Intravenous, Continuous, Cardama, Grayce Sessions, MD, Stopped at 06/27/20 1726 .  0.9 %  sodium chloride infusion, 250 mL, Intravenous, PRN, Sherren Mocha, MD, Last Rate: 10 mL/hr at 06/26/20 0700, Rate Verify at 06/26/20 0700 .  acetaminophen (TYLENOL) tablet 650 mg, 650 mg, Per Tube, Q6H PRN, Julian Hy, DO, 650 mg at 06/29/20 2683 .  aspirin tablet 325 mg, 325 mg, Per Tube, BID, Sherren Mocha, MD, 325 mg at 06/29/20 0900 .  atorvastatin (LIPITOR) tablet 80 mg, 80 mg, Per Tube, Daily, Sherren Mocha, MD, 80 mg at 06/29/20 0918 .  ceFEPIme (MAXIPIME) 2 g in sodium chloride 0.9 % 100 mL IVPB, 2 g, Intravenous, Q8H, Lyndee Leo, RPH, Stopped  at 06/29/20 1535 .  chlorhexidine gluconate (MEDLINE KIT) (PERIDEX) 0.12 % solution 15 mL, 15 mL, Mouth Rinse, BID, Kamat, Sunil G, MD, 15 mL at 06/29/20 0737 .  Chlorhexidine Gluconate Cloth 2 % PADS 6 each, 6 each, Topical, Daily, Sherren Mocha, MD, 6 each at 06/29/20 0830 .  colchicine tablet 0.6 mg, 0.6 mg, Per Tube, BID, Sherren Mocha, MD, 0.6 mg at 06/29/20 0918 .  dexmedetomidine (PRECEDEX) 400 MCG/100ML (4 mcg/mL) infusion, 0.4-1.2 mcg/kg/hr, Intravenous, Titrated, Julian Hy, DO, Last Rate: 15.24 mL/hr at 06/29/20 1800, 0.8 mcg/kg/hr at 06/29/20 1800 .  digoxin (LANOXIN) tablet 0.125 mg, 0.125 mg, Per Tube, Daily, Sherren Mocha, MD, 0.125 mg at 06/29/20 0918 .  docusate (COLACE) 50 MG/5ML liquid 100 mg, 100 mg, Per Tube, BID, Sherren Mocha, MD, 100 mg at 06/29/20 0917 .  feeding supplement (PROSource TF) liquid 45 mL, 45 mL, Per Tube, BID, Julian Hy, DO, 45 mL at 06/29/20 0917 .  feeding supplement (VITAL 1.5 CAL) liquid 1,000 mL, 1,000 mL, Per Tube, Continuous, Julian Hy, DO, Last Rate: 55 mL/hr at 06/29/20 1642, 1,000 mL at 06/29/20 1642 .  fentaNYL (SUBLIMAZE) bolus via  infusion 50 mcg, 50 mcg, Intravenous, Q15 min PRN, Julian Hy, DO, 50 mcg at 06/29/20 0725 .  fentaNYL 2556mcg in NS 230mL (99mcg/ml) infusion-PREMIX, 50-400 mcg/hr, Intravenous, Continuous, Julian Hy, DO, Last Rate: 40 mL/hr at 06/29/20 1800, 400 mcg/hr at 06/29/20 1800 .  heparin ADULT infusion 100 units/mL (25000 units/246mL sodium chloride 0.45%), 1,950 Units/hr, Intravenous, Continuous, Larey Dresser, MD, Last Rate: 19.5 mL/hr at 06/29/20 1800, 1,950 Units/hr at 06/29/20 1800 .  insulin aspart (novoLOG) injection 1-3 Units, 1-3 Units, Subcutaneous, Q4H, Julian Hy, DO, 1 Units at 06/29/20 0762 .  ivabradine (CORLANOR) tablet 5 mg, 5 mg, Per Tube, BID WC, Sherren Mocha, MD, 5 mg at 06/29/20 1644 .  MEDLINE mouth rinse, 15 mL, Mouth Rinse, 10 times per day, Kamat, Lovenia , MD, 15 mL  at 06/29/20 1819 .  midazolam (VERSED) 50 mg/50 mL (1 mg/mL) premix infusion, 0-10 mg/hr, Intravenous, Continuous, Kamat, Sunil G, MD, Last Rate: 4 mL/hr at 06/29/20 1817, 4 mg/hr at 06/29/20 1817 .  midazolam (VERSED) bolus via infusion 1-2 mg, 1-2 mg, Intravenous, Q2H PRN, Kamat, Sunil G, MD .  milrinone (PRIMACOR) 20 MG/100 ML (0.2 mg/mL) infusion, 0.375 mcg/kg/min, Intravenous, Continuous, Larey Dresser, MD, Last Rate: 8.58 mL/hr at 06/29/20 1800, 0.375 mcg/kg/min at 06/29/20 1800 .  nitroGLYCERIN (NITROSTAT) SL tablet 0.4 mg, 0.4 mg, Sublingual, Q5 Min x 3 PRN, Cardama, Grayce Sessions, MD, 0.4 mg at 06/25/20 2342 .  norepinephrine (LEVOPHED) 16 mg in 232mL premix infusion, 0-40 mcg/min, Intravenous, Titrated, Larey Dresser, MD, Last Rate: 9.38 mL/hr at 06/29/20 1800, 10 mcg/min at 06/29/20 1800 .  ondansetron (ZOFRAN) injection 4 mg, 4 mg, Intravenous, Q6H PRN, Rudean Curt, MD .  pantoprazole (PROTONIX) injection 40 mg, 40 mg, Intravenous, Q24H, Julian Hy, DO, 40 mg at 06/29/20 1826 .  polyethylene glycol (MIRALAX / GLYCOLAX) packet 17 g, 17 g, Per Tube, Daily, Sherren Mocha, MD, 17 g at 06/29/20 0917 .  potassium & sodium phosphates (PHOS-NAK) 280-160-250 MG packet 2 packet, 2 packet, Per Tube, Q4H, Noemi Chapel P, DO .  sodium chloride flush (NS) 0.9 % injection 3 mL, 3 mL, Intravenous, Q12H, Sherren Mocha, MD, 3 mL at 06/29/20 0933 .  sodium chloride flush (NS) 0.9 % injection 3 mL, 3 mL, Intravenous, PRN, Sherren Mocha, MD .  vancomycin (VANCOCIN) IVPB 1000 mg/200 mL premix, 1,000 mg, Intravenous, Q12H, Lyndee Leo, [facility], Stopped at 06/29/20 1232   Physical Exam: Intubated, eyes to voice and follow commands x4, incision with small superficial dehiscence superiorly, minimal amount of serous drainage noted, no foul odor. No erythema/induration. Per nursing serous drainage occurred with wound since admission.   Assessment & Plan: 49 y.o. man s/p repeat far  lateral MIS discectomy 9/29, then STEMI s/p PTCA with low EF and apical thrombus.  -continue dressing changes as needed. Currently on cefepime q8h.  [facility] 06/29/20 7:21 PM

## 2020-06-29 NOTE — Progress Notes (Signed)
[place] for tirofiban>>heparin Indication: LV thrombus  No Known Allergies  Patient Measurements: Height: 5\' 8"  (172.7 cm) Weight: 76.7 kg (169 lb 1.5 oz) IBW/kg (Calculated) : 68.4  Vital Signs: Temp: 100.6 F (38.1 C) (10/05 0800) Temp Source: Core (10/05 0400) BP: 94/64 (10/05 0800) Pulse Rate: 99 (10/05 0800)  Labs: Recent Labs    06/27/20 1106 06/27/20 2011 06/27/20 2011 06/27/20 2119 06/28/20 0500 06/28/20 0500 06/28/20 0521 06/28/20 0521 06/29/20 0436 06/29/20 0437 06/29/20 0458  HGB  --  13.7   < >  --  12.9*   < > 13.3   < > 11.5*  --  16.3  HCT  --  42.2   < >  --  39.5   < > 39.0  --  36.2*  --  48.0  PLT  --  243  --   --  231  --   --   --  173  --   --   HEPARINUNFRC   < >  --   --  0.17* 0.30  --   --   --   --  0.19*  --   CREATININE  --  1.17  --   --  1.08  --   --   --  0.77  --   --    < > = values in this interval not displayed.    Estimated Creatinine Clearance: 108.1 mL/min (by C-G formula based on SCr of 0.77 mg/dL).   Medical History: Past Medical History:  Diagnosis Date  . Anxiety   . Coronary artery disease   . Myocardial infarction [facility])     Medications:  Medications Prior to Admission  Medication Sig Dispense Refill Last Dose  . ALPRAZolam (XANAX) 0.5 MG tablet Take 0.5 mg by mouth at bedtime as needed for sleep.    06/24/2020  . Multiple Vitamins-Minerals (CENTRUM ULTRA MENS) TABS Take 1 tablet by mouth.   06/24/2020  . NARCAN 4 MG/0.1ML LIQD nasal spray kit Place 1 spray into the nose once.    unknown  . traZODone (DESYREL) 100 MG tablet Take 100 mg by mouth at bedtime.   06/24/2020    Assessment: 49 y.o. M presented as STEMI - s/p angioplasty. Cardiac surgery consulted for consideration of multivessel CABG - once more stable. Tirofiban started on admission. Patient became more uncomfortable, concern for early cardiogenic shock. Stat echo showed LV thrombus, taken to cath lab for possible  mechanical support. Good cardiac output on relook, no balloon pump placed, starting inotropes.   Heparin level fell 0.19 overnight - subtherapeutic on heparin drip rate 1800 units/hr, no IV line problems - discussed with RN, intubated, no bleeding observed, h/h stable  Plan:  Increase  heparin drip 1950 units/hr Daily HL, CBC    Bonnita Nasuti Pharm.D. CPP, BCPS Clinical Pharmacist [phone] 06/29/2020 8:33 AM

## 2020-06-29 NOTE — Progress Notes (Addendum)
Advanced Heart Failure Rounding Note  PCP-Cardiologist: Sherren Mocha, MD    Patient Profile   49 y/o male s/p recent back surgery, admitted for acute inferior and anterolateral MI. LHC showed thrombotic occlusion of distal RCA that was treated with PTCA and thrombectomy.  He was also noted to have chronic total occlusion of the LAD with collaterals from the right (thus this territory was affected by the RCA MI) as well as 90% complex proximal LCx stenosis. EF 25-30% + apical thrombus. RV normal. Course b/c cardiogenic shock and acute hypoxic respiratory failure. ? Component of septic shock.    Subjective:    Intubated and awake. Off Versed. Weaning precedex. Following commands. A bit agitated.   On Milrinone 0.375 + NE 9. Co-ox 63%. CVP 9.   Swan #s CO 5.16 CI 2.7 PAP 40/23 (30) CVP 9  Continues w/ grade fever. mTemp 100.4.  WBC trending down 31>>27>>15K. Blood cultures NGTD. On Vanc + cefepime.    - 2.4L in UOP yesterday. SCr  0.77.    Objective:   Weight Range: 76.7 kg Body mass index is 25.71 kg/m.   Vital Signs:   Temp:  [98 F (36.7 C)-100.4 F (38 C)] 100.4 F (38 C) (10/05 0643) Pulse Rate:  [88-116] 96 (10/05 0643) Resp:  [0-24] 20 (10/05 0643) BP: (94-116)/(59-76) 97/66 (10/05 0600) SpO2:  [95 %-100 %] 95 % (10/05 0643) FiO2 (%):  [30 %-50 %] 30 % (10/05 0400) Weight:  [76.7 kg] 76.7 kg (10/05 0316) Last BM Date: 06/23/20  Weight change: Filed Weights   06/27/20 0500 06/28/20 0500 06/29/20 0316  Weight: 76.2 kg 70.2 kg 76.7 kg    Intake/Output:   Intake/Output Summary (Last 24 hours) at 06/29/2020 0711 Last data filed at 06/29/2020 0600 Gross per 24 hour  Intake 3813.95 ml  Output 2440 ml  Net 1373.95 ml      Physical Exam    CVP 9 General:  Intubated, awake and a bit agitated  HEENT: +ETT Neck: Supple. JVP ~8 cm . Carotids 2+ bilat; no bruits. No lymphadenopathy or thyromegaly appreciated. Cor: PMI nondisplaced. Regular rhythm,  mildly tachy rate. No rubs, gallops or murmurs. Lungs: intubated, clear  Abdomen: Soft, nontender, nondistended. No hepatosplenomegaly. No bruits or masses. Good bowel sounds. Extremities: No cyanosis, clubbing, rash, edema Neuro: intubated and sedated   Telemetry   NSR 90s- Sinus tach low 100s   EKG    No new EKG to review   Labs    CBC Recent Labs    06/28/20 0500 06/28/20 0521 06/29/20 0436 06/29/20 0458  WBC 27.3*  --  14.6*  --   HGB 12.9*   < > 11.5* 16.3  HCT 39.5   < > 36.2* 48.0  MCV 90.0  --  92.1  --   PLT 231  --  173  --    < > = values in this interval not displayed.   Basic Metabolic Panel Recent Labs    06/28/20 0500 06/28/20 0521 06/28/20 1614 06/29/20 0436 06/29/20 0458  NA 135   < >  --  136 137  K 4.0   < >  --  4.4 4.2  CL 101  --   --  101  --   CO2 24  --   --  26  --   GLUCOSE 151*  --   --  159*  --   BUN 20  --   --  20  --   CREATININE 1.08  --   --  0.77  --   CALCIUM 8.0*  --   --  7.9*  --   MG 1.9   < > 2.5* 2.3  --   PHOS  --   --  2.2* 2.4*  --    < > = values in this interval not displayed.   Liver Function Tests No results for input(s): AST, ALT, ALKPHOS, BILITOT, PROT, ALBUMIN in the last 72 hours. No results for input(s): LIPASE, AMYLASE in the last 72 hours. Cardiac Enzymes No results for input(s): CKTOTAL, CKMB, CKMBINDEX, TROPONINI in the last 72 hours.  BNP: BNP (last 3 results) Recent Labs    06/25/20 2334 06/26/20 0150  BNP 349.1* 368.4*    ProBNP (last 3 results) No results for input(s): PROBNP in the last 8760 hours.   D-Dimer No results for input(s): DDIMER in the last 72 hours. Hemoglobin A1C No results for input(s): HGBA1C in the last 72 hours. Fasting Lipid Panel No results for input(s): CHOL, HDL, LDLCALC, TRIG, CHOLHDL, LDLDIRECT in the last 72 hours. Thyroid Function Tests No results for input(s): TSH, T4TOTAL, T3FREE, THYROIDAB in the last 72 hours.  Invalid input(s): FREET3  Other  results:   Imaging    No results found.   Medications:     Scheduled Medications: . aspirin  325 mg Per Tube BID  . atorvastatin  80 mg Per Tube Daily  . chlorhexidine gluconate (MEDLINE KIT)  15 mL Mouth Rinse BID  . Chlorhexidine Gluconate Cloth  6 each Topical Daily  . colchicine  0.6 mg Per Tube BID  . digoxin  0.125 mg Per Tube Daily  . docusate  100 mg Per Tube BID  . feeding supplement (PROSource TF)  45 mL Per Tube BID  . insulin aspart  1-3 Units Subcutaneous Q4H  . mouth rinse  15 mL Mouth Rinse 10 times per day  . pantoprazole (PROTONIX) IV  40 mg Intravenous Q24H  . polyethylene glycol  17 g Per Tube Daily  . sodium chloride flush  3 mL Intravenous Q12H    Infusions: . sodium chloride Stopped (06/27/20 1726)  . sodium chloride 10 mL/hr at 06/26/20 0700  . ceFEPime (MAXIPIME) IV 200 mL/hr at 06/29/20 0600  . dexmedetomidine (PRECEDEX) IV infusion 0.2 mcg/kg/hr (06/29/20 9326)  . feeding supplement (VITAL 1.5 CAL) Stopped (06/29/20 0600)  . fentaNYL infusion INTRAVENOUS 400 mcg/hr (06/29/20 0600)  . heparin 1,800 Units/hr (06/29/20 0600)  . midazolam Stopped (06/29/20 0524)  . milrinone 0.375 mcg/kg/min (06/29/20 7124)  . norepinephrine (LEVOPHED) Adult infusion 11 mcg/min (06/29/20 0600)  . vancomycin Stopped (06/29/20 0011)    PRN Medications: sodium chloride, acetaminophen, fentaNYL, midazolam, nitroGLYCERIN, ondansetron (ZOFRAN) IV, sodium chloride flush    Assessment/Plan   1. CAD: Late presentation inferior MI.  Cath showed thrombotic occlusion distal RCA, CTO LAD with collaterals from right, and complex 90% proximal LCx stenosis.  He had PTCA/thrombectomy RCA with good flow at end of procedure.  Suspect he had damage to LAD territory as well as RCA territory as RCA collateralized the LAD.  He has had persistent STE on ECG and has prominent pleuritic chest pain as well as a soft friction rub. Suspect post-MI pericarditis. ESR elevated at 20. Pain  likely plays a major role in his tachycardia.  Initial Echo w/ EF 25-30%. RV ok. + LV thrombus  - repeat bedside echo 10/3  EF 25%, normal RV w/ no pericardial effusion, no LV thrombus seen on this echo.  - Continue Colchicine 0.6 bid for  pericarditis.   - Continue ASA 325 mg bid for pericarditis  - No steroids post-MI, no traditional NSAIDs.  - Continue high dose statin.  - Eventually, would ideally have CABG.  Has been seen by TCTS.  Will be high risk in setting of depressed EF and marginal cardiac output.  Needs stabilization first. Placement of Impella to support cardiac surgery now seems feasible with no LV thrombus on last echo.  Will consider MRI for viability once more stable, if he has a lot of nonviable territory, may not be CABG candidate.  2. Acute systolic CHF>>Cardiogenic Shock: Echo with EF 25-30%, RV ok (no evidence for RV infarct), has LV thrombus.  Swan in place, adequate CO by thermodilution on milrinone 0. 375, NE 9. Co-ox 63%. CVP 9 - Continue milrinone 0.375 - Continue digoxin 0.125 daily.  - Hold spironolactone given NE use.  - To get him through eventual CABG, think he would need mechanical support.  Impella now appears to be an option with no LV thrombus on last echo.   3. LV thrombus: On IV heparin gtt.  4. ID: Continues w/ low grade fever 100.4 but, WBCs trending down 26>>31>>27>>15K. This may be effect of acute MI with post-infarct pericarditis but treating empirically w/ broad spectrum abx, on vanc + cefepime.  - Blood cultures NGTD - Procalcitonin 1.21. Follow trend  - CXR 8/2 showed pulmonary edema, no infiltrate  5. S/p back surgery: Micro-discectomy last week.  Per nursing, the wound appears to have opened some compared to initial arrival.  Not draining, no redness.  - Neurosurgery ok'd anticoagulation at time of admission.  - now on broad spectrum abx  - Neurosurgery following, continue dressing changes as needed  6. Acute Hypoxic Respiratory Failure -  currently intubated  - possible extubation today. Defer to PCCM   Length of Stay: [address] Ladoris Gene  06/29/2020, 7:11 AM  Advanced Heart Failure Team Pager [phone] (M-F; [place])  Please contact [facility] for night-coverage after hours (4p -7a ) and weekends on [email or web address]  Patient seen with PA, agree with the above note.   He remains on milrinone 0.375 and NE 8.  CI 2.7 and co-ox 63%.  CVP around 10 for me, I/Os positive yesterday.  He will awaken on vent and follow commands.   General: on vent Neck: JVP 9-10 cm, no thyromegaly or thyroid nodule.  Lungs: Clear to auscultation bilaterally with normal respiratory effort. CV: Nondisplaced PMI.  Heart regular S1/S2, no S3/S4, no murmur.  No peripheral edema.   Abdomen: Soft, nontender, no hepatosplenomegaly, no distention.  Skin: Intact without lesions or rashes.  Neurologic: Will follow commands.  Extremities: No clubbing or cyanosis.  HEENT: Normal.   Cardiogenic shock but stable on current meds with milrinone 0.375 and NE 8.  ?Distributive component due to inflammation with fever, high WBCs. CVP mildly elevated around 10. Good co-ox and thermodilution CO.  - Continue milrinone 0.375 - Wean NE today.  - Continue digoxin 0.125 daily.  - Lasix 40 mg IV bid x 2 doses today, wean vent.   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  Very markedly symptomatic with pleuritic chest pain.  - No steroids post-MI, no traditional NSAIDs.  - Will give ASA 325 mg bid for now.  - Continue colchicine 0.6 mg bid.  - Will awaken and try to wean vent today, hopefully will have enough symptomatic control to extubate.    Still with Tm 100.4, WBCs  trending down => ?due to post-infarct pericarditis versus underlying infection.  Cultures NGTD.  - Continue broad spectrum abx for now.   Severe 3VD, CTO LAD with thrombotic RCA occlusion and complex 90% proximal LCX.  Suspect infarct  this admission involved RCA and LAD territory (LAD is collateralized by RCA).  Now s/p PTCA/thrombectomy RCA.   - He has been seen by TCTS, considering for CABG when stabilized.  He should be able to get Impella 5.5 as LV support with resolution of LV thrombus on last echo.   LV thrombus resolved on echo 10/3, suspect it was acute and not chronic.  Continue heparin gtt.   CRITICAL CARE Performed by: Loralie Champagne  Total critical care time: 40 minutes  Critical care time was exclusive of separately billable procedures and treating other patients.  Critical care was necessary to treat or prevent imminent or life-threatening deterioration.  Critical care was time spent personally by me on the following activities: development of treatment plan with patient and/or surrogate as well as nursing, discussions with consultants, evaluation of patient's response to treatment, examination of patient, obtaining history from patient or surrogate, ordering and performing treatments and interventions, ordering and review of laboratory studies, ordering and review of radiographic studies, pulse oximetry and re-evaluation of patient's condition.  Loralie Champagne 06/29/2020 7:54 AM

## 2020-06-29 NOTE — Progress Notes (Signed)
2 Days Post-Op Procedure(s): INVASIVE LAB ABORTED CASE Subjective: Intubated, awake and responsive  Objective: Vital signs in last 24 hours: Temp:  [98 F (36.7 C)-100.8 F (38.2 C)] 100.6 F (38.1 C) (10/05 0800) Pulse Rate:  [88-116] 99 (10/05 0800) Cardiac Rhythm: Normal sinus rhythm (10/05 0400) Resp:  [0-24] 20 (10/05 0800) BP: (94-116)/(59-76) 94/64 (10/05 0800) SpO2:  [93 %-100 %] 96 % (10/05 0800) FiO2 (%):  [30 %-35 %] 30 % (10/05 0749) Weight:  [76.7 kg] 76.7 kg (10/05 0316)  Hemodynamic parameters for last 24 hours: PAP: (21-57)/(16-37) 53/28 CVP:  [4 mmHg-12 mmHg] 9 mmHg CO:  [5.2 L/min-6.5 L/min] 5.2 L/min CI:  [2.7 L/min/m2-3.4 L/min/m2] 2.7 L/min/m2  Intake/Output from previous day: 10/04 0701 - 10/05 0700 In: 3914.3 [I.V.:2214.6; NG/GT:807.6; IV Piggyback:892.1] Out: 2440 [Urine:2440] Intake/Output this shift: No intake/output data recorded.  General appearance: alert, cooperative and anxious Neurologic: intact Heart: regular rate and rhythm Lungs: clear to auscultation bilaterally Abdomen: normal findings: soft, non-tender  Lab Results: Recent Labs    06/28/20 0500 06/28/20 0521 06/29/20 0436 06/29/20 0458  WBC 27.3*  --  14.6*  --   HGB 12.9*   < > 11.5* 16.3  HCT 39.5   < > 36.2* 48.0  PLT 231  --  173  --    < > = values in this interval not displayed.   BMET:  Recent Labs    06/28/20 0500 06/28/20 0521 06/29/20 0436 06/29/20 0458  NA 135   < > 136 137  K 4.0   < > 4.4 4.2  CL 101  --  101  --   CO2 24  --  26  --   GLUCOSE 151*  --  159*  --   BUN 20  --  20  --   CREATININE 1.08  --  0.77  --   CALCIUM 8.0*  --  7.9*  --    < > = values in this interval not displayed.    PT/INR: No results for input(s): LABPROT, INR in the last 72 hours. ABG    Component Value Date/Time   PHART 7.396 06/29/2020 0458   HCO3 32.3 (H) 06/29/2020 0458   TCO2 34 (H) 06/29/2020 0458   ACIDBASEDEF 1.0 06/26/2020 0029   O2SAT 97.0 06/29/2020  0458   CBG (last 3)  Recent Labs    06/28/20 2338 06/29/20 0335 06/29/20 0750  GLUCAP 186* 152* 128*    Assessment/Plan: S/P Procedure(s): INVASIVE LAB ABORTED CASE -STEMI secondary to RCA occlusion in setting of CTO LAD and 3 vessel CAD Complicated by acute systolic heart failure Acute respiratory failure secondary to pulmonary edema He is oxygenating well on 30% FiO2 but was I/O positive over past 24 hours and CXR shows little change- plan per AHF and CCM WBC better - on vanco and maxipime Will continue to monitor- possible CABG on Friday if he progresses and no evidence of infection   LOS: 4 days    Loreli Slot 06/29/2020

## 2020-06-29 NOTE — Plan of Care (Signed)
  Problem: Cardiac: Goal: Ability to achieve and maintain adequate cardiopulmonary perfusion will improve Outcome: Progressing Goal: Vascular access site(s) Level 0-1 will be maintained Outcome: Progressing   Problem: Clinical Measurements: Goal: Ability to maintain clinical measurements within normal limits will improve Outcome: Progressing Goal: Will remain free from infection Outcome: Progressing Goal: Diagnostic test results will improve Outcome: Progressing Goal: Respiratory complications will improve Outcome: Progressing Goal: Cardiovascular complication will be avoided Outcome: Progressing   Problem: Nutrition: Goal: Adequate nutrition will be maintained Outcome: Progressing   Problem: Elimination: Goal: Will not experience complications related to bowel motility Outcome: Progressing Goal: Will not experience complications related to urinary retention Outcome: Progressing   Problem: Pain Managment: Goal: General experience of comfort will improve Outcome: Progressing   Problem: Safety: Goal: Ability to remain free from injury will improve Outcome: Progressing   Problem: Skin Integrity: Goal: Risk for impaired skin integrity will decrease Outcome: Progressing

## 2020-06-29 NOTE — Progress Notes (Shared)
NAME:  Jerry [name], MRN:  [id number], DOB:  02-Oct-1970, LOS: 4 ADMISSION DATE:  06/25/2020, CONSULTATION DATE:  10/3 REFERRING MD:  Shirlee Latch, CHIEF COMPLAINT:  hypoxia   Brief History   STEMI with LV thrombus, pericarditis  History of present illness   Jerry [name] is a 49 y/o gentleman who presented on 10/1 with chest pain found to have an acute inferior STEMI. He was found to have acute distal RCA occlusion treated with aspiration thrombectomy and balloon angioplasty. He has a CTO LAD with collaterals, severe complex stenossi of the proximal LCx, and severe global and segmental LV dysfunction with elevated LVEDP. His course has been complicated by respiratory failure due to pulmonary edema, pericarditis, and LV thrombus. Today he has had progressive tachycardia, hypotension, and ongoing severe anterior chest pain that is only improved with leaning forward. He has had colchicine, aspirin, and morphine without significant relief in his chest pain. Never smoker, no previous PMH.  Past Medical History  S/p microdiscectomy 9/29  Significant Hospital Events   LHC with aspiration thrombectomy and balloon angioplasty on 10/1  Consults:  PCCM Cardiology  Procedures:  LHC 10/1  Significant Diagnostic Tests:  Echo: LVEF 25-30%, LV thrombus Repeat Echo 10/3 >> EF 25%, LV thrombus not seen   Micro Data:  Blood cx 10/3>>NG2D MRSa negative covid negative  Antimicrobials:  Cefepime 10/3> Bactrim 10/3> vancomycin 10/3>  Interim history/subjective:  Patient is alert and responsive via pen and paper, he is complaining about restraints and feeling of needing to burp. He is cooperative and wishes to be extubated. Currently on SBT with some periods of apnea when sleeping.  Objective   Blood pressure (!) 87/61, pulse (!) 108, temperature (!) 100.9 F (38.3 C), resp. rate (!) 24, height 5\' 8"  (1.727 m), weight 76.7 kg, SpO2 97 %. PAP: (24-76)/(17-39) 34/18 CVP:  [0 mmHg-15 mmHg] 2 mmHg CO:   [5.2 L/min-6.5 L/min] 5.2 L/min CI:  [2.7 L/min/m2-3.4 L/min/m2] 2.7 L/min/m2  Vent Mode: PSV;CPAP FiO2 (%):  [30 %-35 %] 30 % Set Rate:  [20 bmp] 20 bmp Vt Set:  [540 mL] 540 mL PEEP:  [5 cmH20-8 cmH20] 5 cmH20 Pressure Support:  [5 cmH20] 5 cmH20 Plateau Pressure:  [11 cmH20-17 cmH20] 15 cmH20   Intake/Output Summary (Last 24 hours) at 06/29/2020 1315 Last data filed at 06/29/2020 1100 Gross per 24 hour  Intake 3270.92 ml  Output 2315 ml  Net 955.92 ml   Filed Weights   06/27/20 0500 06/28/20 0500 06/29/20 0316  Weight: 76.2 kg 70.2 kg 76.7 kg    Examination: General: ill appearing man on vent, alert and oriented x4 HENT: normocephalic and atraumatic, PERRL, sclera anicteric Lungs: On SBT with ventilator, CTABL without increased WOB Cardiovascular: RRR, S1 S2, apical impulse appropriately located Abdomen: Soft, nontender, nondistended Extremities: No peripheral edema.  Left radial A-line with good distal perfusion Neuro: awake and following commands, EOM intact, MAE  Derm: no rashes or wounds  Resolved Hospital Problem list    Assessment & Plan:   Acute hypoxic respiratory failure due to cardiogenic pulmonary edema, likely also splinting due to pain A: Improved, patient on minimal sedation while also controlling pleuritic chest pain. Passed trial of SBT and plan to extubate today  P: -Extubate and BiPAP -Pain control prn. -Monitor vitals  Cardiogenic shock due to acute decompensated heart failure from ICM Acute inferior STEMI CTO LAD, CAD of LCx as well Pericarditis -Continue norepinephrine as required to maintain appropriate cardiac output and MAP greater than 65 -Daily  aspirin, heparin infusion -Appreciate cardiology & TCTS management. -No steroids due to increased risk of LV rupture; con't colchicine. Additional 4mg  morphine now -Daily atorvastatin -Continue spironolactone -Unable to tolerate Bblocker and ACE or ARB due to shock -Continue diuresis per  cardiology electrolyte management  Fevers due to pericarditis versus possible right lower lobe pneumonia -Continue empiric antibiotics -Continue aspirin BID -Scheduled Tylenol x 24 hours -Continue to follow cultures  Hyperglycemia-controlled -Accu-Cheks every 4 hours to sliding scale insulin. -Goal BG 140-180  Risk for malnutrition -Starting tube feeds today  Acute anemia, likely due to acute illness -Transfuse for hemoglobin less than 8 or hemodynamically significant bleeding -Continue to monitor  S/p back surgery: Micro-discectomy last week. Some wound dehiscence - Neurosurgery ok'd anticoagulation at time of admission.  - now on broad spectrum abx  - Neurosurgery following, continue dressing changes as needed   Best practice:  Diet: NPO Pain/Anxiety/Delirium protocol (if indicated): yes VAP protocol (if indicated): yes DVT prophylaxis: heparin GI prophylaxis: pantoprazole Glucose control: per primary Mobility: bedrest Code Status: full Family Communication: Mother updated at bedside Disposition: ICU  Labs   CBC: Recent Labs  Lab 06/26/20 0150 06/26/20 1351 06/27/20 0226 06/27/20 0226 06/27/20 2011 06/28/20 0500 06/28/20 0521 06/29/20 0436 06/29/20 0458  WBC 32.0*  --  25.5*  --  30.8* 27.3*  --  14.6*  --   HGB 17.5*   < > 14.1   < > 13.7 12.9* 13.3 11.5* 16.3  HCT 54.5*   < > 43.6   < > 42.2 39.5 39.0 36.2* 48.0  MCV 92.5  --  90.6  --  89.8 90.0  --  92.1  --   PLT 343  --  212  --  243 231  --  173  --    < > = values in this interval not displayed.    Basic Metabolic Panel: Recent Labs  Lab 06/26/20 0150 06/26/20 1351 06/27/20 0226 06/27/20 0226 06/27/20 2011 06/28/20 0500 06/28/20 0521 06/28/20 1614 06/29/20 0436 06/29/20 0458  NA 140   < > 136   < > 135 135 136  --  136 137  K 4.1   < > 3.5   < > 4.2 4.0 4.4  --  4.4 4.2  CL 103  --  100  --  100 101  --   --  101  --   CO2 22  --  24  --  23 24  --   --  26  --   GLUCOSE 142*  --   112*  --  149* 151*  --   --  159*  --   BUN 14  --  18  --  21* 20  --   --  20  --   CREATININE 1.00  --  1.06  --  1.17 1.08  --   --  0.77  --   CALCIUM 9.2  --  8.7*  --  8.1* 8.0*  --   --  7.9*  --   MG  --   --   --   --   --  1.9  --  2.5* 2.3  --   PHOS  --   --   --   --   --   --   --  2.2* 2.4*  --    < > = values in this interval not displayed.   GFR: Estimated Creatinine Clearance: 108.1 mL/min (by C-G formula based on SCr of 0.77  mg/dL). Recent Labs  Lab 06/27/20 0226 06/27/20 1106 06/27/20 1134 06/27/20 1544 06/27/20 1758 06/27/20 2011 06/27/20 2121 06/28/20 0500 06/29/20 0436  PROCALCITON  --  1.21  --   --   --   --   --   --   --   WBC 25.5*  --   --   --   --  30.8*  --  27.3* 14.6*  LATICACIDVEN  --   --  1.2 1.5 1.2  --  1.2  --   --     Liver Function Tests: No results for input(s): AST, ALT, ALKPHOS, BILITOT, PROT, ALBUMIN in the last 168 hours. No results for input(s): LIPASE, AMYLASE in the last 168 hours. No results for input(s): AMMONIA in the last 168 hours.  ABG    Component Value Date/Time   PHART 7.396 06/29/2020 0458   PCO2ART 53.2 (H) 06/29/2020 0458   PO2ART 100 06/29/2020 0458   HCO3 32.3 (H) 06/29/2020 0458   TCO2 34 (H) 06/29/2020 0458   ACIDBASEDEF 1.0 06/26/2020 0029   O2SAT 97.0 06/29/2020 0458     Coagulation Profile: Recent Labs  Lab 06/25/20 2334  INR 1.2    Cardiac Enzymes: No results for input(s): CKTOTAL, CKMB, CKMBINDEX, TROPONINI in the last 168 hours.  HbA1C: Hgb A1c MFr Bld  Date/Time Value Ref Range Status  06/25/2020 11:34 PM 5.6 4.8 - 5.6 % Final    Comment:    (NOTE) Pre diabetes:          5.7%-6.4%  Diabetes:              >6.4%  Glycemic control for   <7.0% adults with diabetes     CBG: Recent Labs  Lab 06/28/20 1958 06/28/20 2338 06/29/20 0335 06/29/20 0750 06/29/20 1117  GLUCAP 167* 186* 152* 128* 106*     Olene Craven, MS4

## 2020-06-30 ENCOUNTER — Inpatient Hospital Stay (HOSPITAL_COMMUNITY): Payer: 59

## 2020-06-30 ENCOUNTER — Other Ambulatory Visit (HOSPITAL_COMMUNITY): Payer: 59

## 2020-06-30 DIAGNOSIS — I5043 Acute on chronic combined systolic (congestive) and diastolic (congestive) heart failure: Secondary | ICD-10-CM

## 2020-06-30 DIAGNOSIS — I25110 Atherosclerotic heart disease of native coronary artery with unstable angina pectoris: Secondary | ICD-10-CM

## 2020-06-30 DIAGNOSIS — J810 Acute pulmonary edema: Secondary | ICD-10-CM | POA: Diagnosis not present

## 2020-06-30 DIAGNOSIS — J181 Lobar pneumonia, unspecified organism: Secondary | ICD-10-CM | POA: Diagnosis not present

## 2020-06-30 DIAGNOSIS — I2111 ST elevation (STEMI) myocardial infarction involving right coronary artery: Secondary | ICD-10-CM | POA: Diagnosis not present

## 2020-06-30 DIAGNOSIS — Z9911 Dependence on respirator [ventilator] status: Secondary | ICD-10-CM | POA: Diagnosis not present

## 2020-06-30 LAB — CBC
HCT: 35.6 % — ABNORMAL LOW (ref 39.0–52.0)
Hemoglobin: 11.6 g/dL — ABNORMAL LOW (ref 13.0–17.0)
MCH: 29.7 pg (ref 26.0–34.0)
MCHC: 32.6 g/dL (ref 30.0–36.0)
MCV: 91.3 fL (ref 80.0–100.0)
Platelets: 173 10*3/uL (ref 150–400)
RBC: 3.9 MIL/uL — ABNORMAL LOW (ref 4.22–5.81)
RDW: 12.9 % (ref 11.5–15.5)
WBC: 12.6 10*3/uL — ABNORMAL HIGH (ref 4.0–10.5)
nRBC: 0 % (ref 0.0–0.2)

## 2020-06-30 LAB — BASIC METABOLIC PANEL
Anion gap: 9 (ref 5–15)
BUN: 17 mg/dL (ref 6–20)
CO2: 27 mmol/L (ref 22–32)
Calcium: 8.1 mg/dL — ABNORMAL LOW (ref 8.9–10.3)
Chloride: 98 mmol/L (ref 98–111)
Creatinine, Ser: 0.75 mg/dL (ref 0.61–1.24)
GFR calc non Af Amer: >60 mL/min (ref >60)
Glucose, Bld: 143 mg/dL — ABNORMAL HIGH (ref 70–99)
Potassium: 4.1 mmol/L (ref 3.5–5.1)
Sodium: 134 mmol/L — ABNORMAL LOW (ref 135–145)

## 2020-06-30 LAB — GLUCOSE, CAPILLARY
Glucose-Capillary: 129 mg/dL — ABNORMAL HIGH (ref 70–99)
Glucose-Capillary: 143 mg/dL — ABNORMAL HIGH (ref 70–99)
Glucose-Capillary: 129 mg/dL — ABNORMAL HIGH (ref 70–99)
Glucose-Capillary: 91 mg/dL (ref 70–99)
Glucose-Capillary: 115 mg/dL — ABNORMAL HIGH (ref 70–99)
Glucose-Capillary: 84 mg/dL (ref 70–99)

## 2020-06-30 LAB — COOXEMETRY PANEL
Carboxyhemoglobin: 1.2 % (ref 0.5–1.5)
Methemoglobin: 0.8 % (ref 0.0–1.5)
O2 Saturation: 61.7 %
Total hemoglobin: 13.2 g/dL (ref 12.0–16.0)

## 2020-06-30 LAB — PHOSPHORUS: Phosphorus: 3.3 mg/dL (ref 2.5–4.6)

## 2020-06-30 LAB — VANCOMYCIN, TROUGH: Vancomycin Tr: <4 ug/mL — ABNORMAL LOW (ref 15–20)

## 2020-06-30 LAB — HEPARIN LEVEL (UNFRACTIONATED): Heparin Unfractionated: 0.32 IU/mL (ref 0.30–0.70)

## 2020-06-30 LAB — MAGNESIUM: Magnesium: 2.2 mg/dL (ref 1.7–2.4)

## 2020-06-30 MED ORDER — OXYCODONE-ACETAMINOPHEN 5-325 MG PO TABS
1.0000 | ORAL_TABLET | Freq: Four times a day (QID) | ORAL | Status: AC
  Administered 2020-06-30 – 2020-07-01 (×4): 1 via ORAL
  Filled 2020-06-30 (×4): qty 1

## 2020-06-30 MED ORDER — ALPRAZOLAM 0.5 MG PO TABS
0.5000 mg | ORAL_TABLET | Freq: Three times a day (TID) | ORAL | Status: DC | PRN
  Administered 2020-06-30 – 2020-07-06 (×9): 0.5 mg via ORAL
  Filled 2020-06-30 (×10): qty 1

## 2020-06-30 MED ORDER — FUROSEMIDE 10 MG/ML IJ SOLN
40.0000 mg | Freq: Two times a day (BID) | INTRAMUSCULAR | Status: AC
  Administered 2020-06-30 – 2020-07-01 (×2): 40 mg via INTRAVENOUS
  Filled 2020-06-30 (×2): qty 4

## 2020-06-30 MED ORDER — FENTANYL CITRATE (PF) 100 MCG/2ML IJ SOLN
50.0000 ug | INTRAMUSCULAR | Status: DC | PRN
  Administered 2020-06-30 – 2020-07-01 (×3): 50 ug via INTRAVENOUS
  Administered 2020-07-02: 100 ug via INTRAVENOUS
  Filled 2020-06-30 (×3): qty 2

## 2020-06-30 MED ORDER — VANCOMYCIN HCL IN DEXTROSE 1-5 GM/200ML-% IV SOLN
1000.0000 mg | Freq: Three times a day (TID) | INTRAVENOUS | Status: AC
  Administered 2020-06-30 – 2020-07-03 (×10): 1000 mg via INTRAVENOUS
  Filled 2020-06-30 (×10): qty 200

## 2020-06-30 MED ORDER — FUROSEMIDE 40 MG PO TABS
40.0000 mg | ORAL_TABLET | Freq: Every day | ORAL | Status: DC
  Administered 2020-06-30: 40 mg
  Filled 2020-06-30: qty 1

## 2020-06-30 NOTE — Progress Notes (Signed)
      [address]       [place] [address]             [phone]      Extubated earlier today, c/o shortness of breath  BP 91/63   Pulse (!) 102   Temp 98.6 F (37 C) (Oral)   Resp 19   Ht 5\' 8"  (1.727 m)   Wt 76.7 kg   SpO2 100%   BMI 25.71 kg/m   Intake/Output Summary (Last 24 hours) at 06/30/2020 1647 Last data filed at 06/30/2020 1511 Gross per 24 hour  Intake 2988.54 ml  Output 4170 ml  Net -1181.46 ml   Anxious, using accessory muscles of breathing  CXR still shows significant pulmonary edema.  We are tentatively planning CABG on Friday but I'm not sure he is going to ready from a pulmonary standpoint Needs aggressive diuresis  Will continue to follow  Thursday C. Viviann Spare, MD [facility] [phone]

## 2020-06-30 NOTE — Progress Notes (Signed)
Pharmacy Antibiotic Note  Jerry [name] is a 49 y.o. male admitted on 06/25/2020 with chest pain/code stemi. Patient well known to pharmacy service for anticoagulation dosing. Recent microdiscectomy last week, incision stable per neurosurgery follow up, fevers up to 101, with wbc improved 25>12. On broad spectrum ABX for wound and RLL pna vancomycin and cefepime.  Low VT 4, Cr 0.75, crcl 100 ml/min  Plan: Increase Vancomycin 1gm Q8hr  Goal trough 15-20 mcg/mL. Cefepime 2g q8 hours  Height: 5\' 8"  (172.7 cm) Weight: 76.7 kg (169 lb 1.5 oz) IBW/kg (Calculated) : 68.4  Temp (24hrs), Avg:99.5 F (37.5 C), Min:95.5 F (35.3 C), Max:101.5 F (38.6 C)  Recent Labs  Lab 06/27/20 0226 06/27/20 1134 06/27/20 1544 06/27/20 1758 06/27/20 2011 06/27/20 2121 06/28/20 0500 06/29/20 0436 06/30/20 0404 06/30/20 1337  WBC 25.5*  --   --   --  30.8*  --  27.3* 14.6* 12.6*  --   CREATININE 1.06  --   --   --  1.17  --  1.08 0.77 0.75  --   LATICACIDVEN  --  1.2 1.5 1.2  --  1.2  --   --   --   --   VANCOTROUGH  --   --   --   --   --   --   --   --   --  <4*    Estimated Creatinine Clearance: 108.1 mL/min (by C-G formula based on SCr of 0.75 mg/dL).    No Known Allergies  08/30/20 Pharm.D. CPP, BCPS Clinical Pharmacist [phone] 06/30/2020 4:20 PM

## 2020-06-30 NOTE — Progress Notes (Signed)
Patient ID: Jerry [name], male   DOB: [birth year], 49 y.o.   MRN: [id number]     Advanced Heart Failure Rounding Note  PCP-Cardiologist: Sherren Mocha, MD    Patient Profile   49 y/o male s/p recent back surgery, admitted for acute inferior and anterolateral MI. LHC showed thrombotic occlusion of distal RCA that was treated with PTCA and thrombectomy.  He was also noted to have chronic total occlusion of the LAD with collaterals from the right (thus this territory was affected by the RCA MI) as well as 90% complex proximal LCx stenosis. EF 25-30% + apical thrombus. RV normal. Course b/c cardiogenic shock and acute hypoxic respiratory failure. ? Component of septic shock.    Subjective:    Patient remains intubated, asleep this morning.  Afebrile overnight. Remains on vancomycin/cefepime, pending sputum culture data.  WBCs 12.6.  PCT 0.96. CXR possible RLL PNA.   On Milrinone 0.375 + NE 8. MAP stable. I/Os negative yesterday with stable creatinine.   Per mother yesterday, still have chest pain.    Swan #s CI 2.8 PAP 39/19 CVP 4 Co-ox 62%   Objective:   Weight Range: 76.7 kg Body mass index is 25.71 kg/m.   Vital Signs:   Temp:  [95.5 F (35.3 C)-101.7 F (38.7 C)] 99.3 F (37.4 C) (10/06 0700) Pulse Rate:  [73-142] 73 (10/06 0700) Resp:  [10-27] 20 (10/06 0700) BP: (87-129)/(60-89) 98/65 (10/06 0700) SpO2:  [93 %-99 %] 98 % (10/06 0700) FiO2 (%):  [30 %] 30 % (10/06 0758) Last BM Date: 06/23/20  Weight change: Filed Weights   06/27/20 0500 06/28/20 0500 06/29/20 0316  Weight: 76.2 kg 70.2 kg 76.7 kg    Intake/Output:   Intake/Output Summary (Last 24 hours) at 06/30/2020 0759 Last data filed at 06/30/2020 0700 Gross per 24 hour  Intake 3623.81 ml  Output 4425 ml  Net -801.19 ml      Physical Exam    CVP 4 General: NAD Neck: No JVD, no thyromegaly or thyroid nodule.  Lungs: Decreased at bases.  CV: Nondisplaced PMI.  Heart regular S1/S2, no S3/S4, no  murmur. I do not hear a friction rub today.  No peripheral edema.    Abdomen: Soft, nontender, no hepatosplenomegaly, no distention.  Skin: Intact without lesions or rashes.  Neurologic: Alert and oriented x 3.  Psych: Normal affect. Extremities: No clubbing or cyanosis.  HEENT: Normal.    Telemetry   NSR 70s (personally reviewed).    EKG    No new EKG to review   Labs    CBC Recent Labs    06/29/20 0436 06/29/20 0436 06/29/20 0458 06/30/20 0404  WBC 14.6*  --   --  12.6*  HGB 11.5*   < > 16.3 11.6*  HCT 36.2*   < > 48.0 35.6*  MCV 92.1  --   --  91.3  PLT 173  --   --  173   < > = values in this interval not displayed.   Basic Metabolic Panel Recent Labs    06/29/20 0436 06/29/20 0436 06/29/20 0458 06/29/20 1649 06/30/20 0404  NA 136   < > 137  --  134*  K 4.4   < > 4.2  --  4.1  CL 101  --   --   --  98  CO2 26  --   --   --  27  GLUCOSE 159*  --   --   --  143*  BUN 20  --   --   --  17  CREATININE 0.77  --   --   --  0.75  CALCIUM 7.9*  --   --   --  8.1*  MG 2.3  --   --  2.0  --   PHOS 2.4*  --   --  1.7*  --    < > = values in this interval not displayed.   Liver Function Tests No results for input(s): AST, ALT, ALKPHOS, BILITOT, PROT, ALBUMIN in the last 72 hours. No results for input(s): LIPASE, AMYLASE in the last 72 hours. Cardiac Enzymes No results for input(s): CKTOTAL, CKMB, CKMBINDEX, TROPONINI in the last 72 hours.  BNP: BNP (last 3 results) Recent Labs    06/25/20 2334 06/26/20 0150  BNP 349.1* 368.4*    ProBNP (last 3 results) No results for input(s): PROBNP in the last 8760 hours.   D-Dimer No results for input(s): DDIMER in the last 72 hours. Hemoglobin A1C No results for input(s): HGBA1C in the last 72 hours. Fasting Lipid Panel No results for input(s): CHOL, HDL, LDLCALC, TRIG, CHOLHDL, LDLDIRECT in the last 72 hours. Thyroid Function Tests No results for input(s): TSH, T4TOTAL, T3FREE, THYROIDAB in the last 72  hours.  Invalid input(s): FREET3  Other results:   Imaging    DG CHEST PORT 1 VIEW  Result Date: 06/29/2020 CLINICAL DATA:  Intubation.  CHF. EXAM: PORTABLE CHEST 1 VIEW COMPARISON:  06/27/2020. FINDINGS: Endotracheal tube and NG tube in stable position. Swan-Ganz catheter noted with its tip over the pulmonary outflow tract. Heart size normal. Surgical clips left apex. Diffuse bilateral pulmonary infiltrates/edema again noted without interim change. No pleural effusion or pneumothorax. Prior right clavicular plate and screw fixation. IMPRESSION: 1. Endotracheal tube and NG tube in stable position. Swan-Ganz catheter noted with its tip over the pulmonary outflow tract. 2. Diffuse bilateral pulmonary infiltrates/edema again noted without interim change. Electronically Signed   By: Marcello Moores  Register   On: 06/29/2020 08:31     Medications:     Scheduled Medications: . aspirin  325 mg Per Tube BID  . atorvastatin  80 mg Per Tube Daily  . chlorhexidine gluconate (MEDLINE KIT)  15 mL Mouth Rinse BID  . Chlorhexidine Gluconate Cloth  6 each Topical Daily  . colchicine  0.6 mg Per Tube BID  . digoxin  0.125 mg Per Tube Daily  . docusate  100 mg Per Tube BID  . feeding supplement (PROSource TF)  45 mL Per Tube BID  . insulin aspart  1-3 Units Subcutaneous Q4H  . ivabradine  5 mg Per Tube BID WC  . mouth rinse  15 mL Mouth Rinse 10 times per day  . pantoprazole (PROTONIX) IV  40 mg Intravenous Q24H  . polyethylene glycol  17 g Per Tube Daily  . sodium chloride flush  3 mL Intravenous Q12H    Infusions: . sodium chloride Stopped (06/27/20 1726)  . sodium chloride 10 mL/hr at 06/26/20 0700  . ceFEPime (MAXIPIME) IV Stopped (06/30/20 9390)  . dexmedetomidine (PRECEDEX) IV infusion 0.4 mcg/kg/hr (06/30/20 0700)  . feeding supplement (VITAL 1.5 CAL) 55 mL/hr at 06/30/20 0700  . fentaNYL infusion INTRAVENOUS 400 mcg/hr (06/30/20 0700)  . heparin 1,950 Units/hr (06/30/20 0700)  . midazolam 3  mg/hr (06/30/20 0700)  . milrinone 0.375 mcg/kg/min (06/30/20 0700)  . norepinephrine (LEVOPHED) Adult infusion 8 mcg/min (06/30/20 0700)  . vancomycin Stopped (06/29/20 2355)    PRN Medications: sodium chloride, acetaminophen, fentaNYL, midazolam, nitroGLYCERIN, ondansetron (ZOFRAN) IV, sodium chloride flush  Assessment/Plan   1. CAD: Late presentation inferior MI.  Cath showed thrombotic occlusion distal RCA, CTO LAD with collaterals from right, and complex 90% proximal LCx stenosis.  He had PTCA/thrombectomy RCA with good flow at end of procedure.  Suspect he had damage to LAD territory as well as RCA territory as RCA collateralized the LAD.   - ASA/statin.  - Eventually, would ideally have CABG.  Has been seen by [facility].  Will be high risk in setting of depressed EF and marginal cardiac output.  Needs stabilization first. Placement of Impella to support cardiac surgery now seems feasible with no LV thrombus on last echo.   2. Post-infarct pericarditis: He has had persistent STE on ECG and has prominent pleuritic chest pain as well as a soft friction rub. Suspect post-MI pericarditis. ESR elevated at 20.   - Continue ASA 325 mg bid for pericarditis  - Colchicine 0.6 bid.  - No steroids post-MI, no traditional NSAIDs.  - Repeat ECG today.  3. Acute systolic CHF>>Cardiogenic Shock: Echo with EF 25-30%, RV ok (no evidence for RV infarct), LV thrombus.  Repeat bedside echo 10/3  EF 25%, normal RV w/ no pericardial effusion, no LV thrombus seen on this echo. Swan in place, adequate CO by thermodilution on milrinone 0.375, NE 8. Co-ox 62%. CVP down to 4. - Continue milrinone 0.375 - Continue digoxin 0.125 daily.  - Wean NE after he wakes up today (weaning sedation).  - Ivabradine controlling HR well at this point.  - Lasix 40 mg per tube daily for now, keep I/Os even to slightly negative.  - To get him through eventual CABG, think he would need mechanical support.  Impella now appears to be  an option with no LV thrombus on last echo.   4. LV thrombus: Resolved on 10/3 echo. On IV heparin gtt.  5. ID: Now afebrile with WBCs trending down 26>>31>>27>>15K>>12.6.  PCT mildly elevated 0.96. This may be effect of acute MI with post-infarct pericarditis but treating empirically w/ broad spectrum abx, on vanc + cefepime. Possible RLL PNA on CXR.  Blood cultures NGTD, pending sputum culture result.  - Empiric vancomycin/cefepime.  6. S/p back surgery: Micro-discectomy last week.  Neurosurgery following, appreciated.  7. Acute Hypoxic Respiratory Failure: Pulmonary edema +/- PNA. CVP down to 4.  - CXR today.  - Lasix per tube as above.  - Wean sedation, if pain reasonably controlled and it appears he can take deep breaths, may be able to extubate today.   CRITICAL CARE Performed by: Loralie Champagne  Total critical care time: 35 minutes  Critical care time was exclusive of separately billable procedures and treating other patients.  Critical care was necessary to treat or prevent imminent or life-threatening deterioration.  Critical care was time spent personally by me on the following activities: development of treatment plan with patient and/or surrogate as well as nursing, discussions with consultants, evaluation of patient's response to treatment, examination of patient, obtaining history from patient or surrogate, ordering and performing treatments and interventions, ordering and review of laboratory studies, ordering and review of radiographic studies, pulse oximetry and re-evaluation of patient's condition.  Loralie Champagne 06/30/2020 7:59 AM

## 2020-06-30 NOTE — Progress Notes (Signed)
Neurosurgery Service Progress Note  Subjective: No acute events overnight. No new complaints this morning  Objective: Vitals:   06/30/20 0639 06/30/20 0645 06/30/20 0700 06/30/20 0758  BP:   98/65   Pulse: 74 73 73   Resp: $Remo'20 20 20   'sosLY$ Temp: 99.3 F (37.4 C) 99.3 F (37.4 C) 99.3 F (37.4 C)   TempSrc:      SpO2: 97% 97% 98% 98%  Weight:      Height:       Temp (24hrs), Avg:99.6 F (37.6 C), Min:95.5 F (35.3 C), Max:101.7 F (38.7 C)  CBC Latest Ref Rng & Units 06/30/2020 06/29/2020 06/29/2020  WBC 4.0 - 10.5 K/uL 12.6(H) - 14.6(H)  Hemoglobin 13.0 - 17.0 g/dL 11.6(L) 16.3 11.5(L)  Hematocrit 39 - 52 % 35.6(L) 48.0 36.2(L)  Platelets 150 - 400 K/uL 173 - 173   BMP Latest Ref Rng & Units 06/30/2020 06/29/2020 06/29/2020  Glucose 70 - 99 mg/dL 143(H) - 159(H)  BUN 6 - 20 mg/dL 17 - 20  Creatinine 0.61 - 1.24 mg/dL 0.75 - 0.77  Sodium 135 - 145 mmol/L 134(L) 137 136  Potassium 3.5 - 5.1 mmol/L 4.1 4.2 4.4  Chloride 98 - 111 mmol/L 98 - 101  CO2 22 - 32 mmol/L 27 - 26  Calcium 8.9 - 10.3 mg/dL 8.1(L) - 7.9(L)    Intake/Output Summary (Last 24 hours) at 06/30/2020 0942 Last data filed at 06/30/2020 0700 Gross per 24 hour  Intake 3455.99 ml  Output 3200 ml  Net 255.99 ml    Current Facility-Administered Medications:  .  0.9 %  sodium chloride infusion, , Intravenous, Continuous, Cardama, Grayce Sessions, MD, Stopped at 06/27/20 1726 .  0.9 %  sodium chloride infusion, 250 mL, Intravenous, PRN, Sherren Mocha, MD, Last Rate: 10 mL/hr at 06/26/20 0700, Rate Verify at 06/26/20 0700 .  acetaminophen (TYLENOL) tablet 650 mg, 650 mg, Per Tube, Q6H PRN, Julian Hy, DO, 650 mg at 06/29/20 4097 .  aspirin tablet 325 mg, 325 mg, Per Tube, BID, Sherren Mocha, MD, 325 mg at 06/30/20 0853 .  atorvastatin (LIPITOR) tablet 80 mg, 80 mg, Per Tube, Daily, Sherren Mocha, MD, 80 mg at 06/30/20 0854 .  ceFEPIme (MAXIPIME) 2 g in sodium chloride 0.9 % 100 mL IVPB, 2 g, Intravenous, Q8H,  Lyndee Leo, RPH, Stopped at 06/30/20 3532 .  chlorhexidine gluconate (MEDLINE KIT) (PERIDEX) 0.12 % solution 15 mL, 15 mL, Mouth Rinse, BID, Kamat, Sunil G, MD, 15 mL at 06/30/20 0845 .  Chlorhexidine Gluconate Cloth 2 % PADS 6 each, 6 each, Topical, Daily, Sherren Mocha, MD, 6 each at 06/29/20 0830 .  colchicine tablet 0.6 mg, 0.6 mg, Per Tube, BID, Sherren Mocha, MD, 0.6 mg at 06/30/20 0854 .  dexmedetomidine (PRECEDEX) 400 MCG/100ML (4 mcg/mL) infusion, 0.4-1.2 mcg/kg/hr, Intravenous, Titrated, Julian Hy, DO, Last Rate: 7.62 mL/hr at 06/30/20 0700, 0.4 mcg/kg/hr at 06/30/20 0700 .  digoxin (LANOXIN) tablet 0.125 mg, 0.125 mg, Per Tube, Daily, Sherren Mocha, MD, 0.125 mg at 06/30/20 0853 .  docusate (COLACE) 50 MG/5ML liquid 100 mg, 100 mg, Per Tube, BID, Sherren Mocha, MD, 100 mg at 06/30/20 0853 .  feeding supplement (PROSource TF) liquid 45 mL, 45 mL, Per Tube, BID, Julian Hy, DO, 45 mL at 06/30/20 0903 .  feeding supplement (VITAL 1.5 CAL) liquid 1,000 mL, 1,000 mL, Per Tube, Continuous, Julian Hy, DO, Last Rate: 55 mL/hr at 06/30/20 0700, Rate Verify at 06/30/20 0700 .  fentaNYL (SUBLIMAZE)  bolus via infusion 50 mcg, 50 mcg, Intravenous, Q15 min PRN, Julian Hy, DO, 50 mcg at 06/29/20 0725 .  fentaNYL 2559mcg in NS 237mL (33mcg/ml) infusion-PREMIX, 50-400 mcg/hr, Intravenous, Continuous, Julian Hy, DO, Last Rate: 40 mL/hr at 06/30/20 0700, 400 mcg/hr at 06/30/20 0700 .  furosemide (LASIX) tablet 40 mg, 40 mg, Per Tube, Daily, Larey Dresser, MD, 40 mg at 06/30/20 0903 .  heparin ADULT infusion 100 units/mL (25000 units/222mL sodium chloride 0.45%), 1,950 Units/hr, Intravenous, Continuous, Larey Dresser, MD, Last Rate: 19.5 mL/hr at 06/30/20 0700, 1,950 Units/hr at 06/30/20 0700 .  insulin aspart (novoLOG) injection 1-3 Units, 1-3 Units, Subcutaneous, Q4H, Julian Hy, DO, 1 Units at 06/30/20 0845 .  ivabradine (CORLANOR) tablet 5 mg, 5 mg, Per Tube,  BID WC, Sherren Mocha, MD, 5 mg at 06/29/20 1644 .  MEDLINE mouth rinse, 15 mL, Mouth Rinse, 10 times per day, Margaretmary Lombard, MD, 15 mL at 06/30/20 0652 .  midazolam (VERSED) 50 mg/50 mL (1 mg/mL) premix infusion, 0-10 mg/hr, Intravenous, Continuous, Kamat, Sunil G, MD, Last Rate: 3 mL/hr at 06/30/20 0700, 3 mg/hr at 06/30/20 0700 .  midazolam (VERSED) bolus via infusion 1-2 mg, 1-2 mg, Intravenous, Q2H PRN, Kamat, Sunil G, MD .  milrinone (PRIMACOR) 20 MG/100 ML (0.2 mg/mL) infusion, 0.375 mcg/kg/min, Intravenous, Continuous, Larey Dresser, MD, Last Rate: 8.58 mL/hr at 06/30/20 0700, 0.375 mcg/kg/min at 06/30/20 0700 .  nitroGLYCERIN (NITROSTAT) SL tablet 0.4 mg, 0.4 mg, Sublingual, Q5 Min x 3 PRN, Cardama, Grayce Sessions, MD, 0.4 mg at 06/25/20 2342 .  norepinephrine (LEVOPHED) 16 mg in 272mL premix infusion, 0-40 mcg/min, Intravenous, Titrated, Larey Dresser, MD, Last Rate: 7.5 mL/hr at 06/30/20 0700, 8 mcg/min at 06/30/20 0700 .  ondansetron (ZOFRAN) injection 4 mg, 4 mg, Intravenous, Q6H PRN, Rudean Curt, MD .  pantoprazole (PROTONIX) injection 40 mg, 40 mg, Intravenous, Q24H, Julian Hy, DO, 40 mg at 06/29/20 1826 .  polyethylene glycol (MIRALAX / GLYCOLAX) packet 17 g, 17 g, Per Tube, Daily, Sherren Mocha, MD, 17 g at 06/30/20 0854 .  sodium chloride flush (NS) 0.9 % injection 3 mL, 3 mL, Intravenous, Q12H, Sherren Mocha, MD, 3 mL at 06/29/20 0933 .  sodium chloride flush (NS) 0.9 % injection 3 mL, 3 mL, Intravenous, PRN, Sherren Mocha, MD .  vancomycin (VANCOCIN) IVPB 1000 mg/200 mL premix, 1,000 mg, Intravenous, Q12H, Lyndee Leo, [facility], Stopped at 06/29/20 2355   Physical Exam: Intubated, eyes to voice and follow commands x4, incision with small superficial dehiscence superiorly, minimal amount of serous drainage noted, no foul odor. No erythema/induration. No pus noted. The wound is dry and clean. Per nursing serous drainage occurred with wound since  admission.  Pt is weaning off sedation today, possible extubation today per CCM team.   Assessment & Plan: 49 y.o.mans/p repeat far lateral MIS discectomy 9/29, then STEMI s/p PTCA with low EF and apical thrombus.  - continue dressing changes as needed. Currently on cefepime q8h per CCM recs.   Osie Cheeks, NP 06/30/20 9:42 AM

## 2020-06-30 NOTE — Progress Notes (Signed)
[place] for tirofiban>>heparin Indication: LV thrombus  No Known Allergies  Patient Measurements: Height: 5\' 8"  (172.7 cm) Weight: 76.7 kg (169 lb 1.5 oz) IBW/kg (Calculated) : 68.4  Vital Signs: Temp: 98.6 F (37 C) (10/06 1533) Temp Source: Oral (10/06 1533) BP: 91/63 (10/06 1445) Pulse Rate: 102 (10/06 1445)  Labs: Recent Labs    06/28/20 0500 06/28/20 0521 06/29/20 0436 06/29/20 0436 06/29/20 0437 06/29/20 0458 06/30/20 0404  HGB 12.9*   < > 11.5*   < >  --  16.3 11.6*  HCT 39.5   < > 36.2*  --   --  48.0 35.6*  PLT 231  --  173  --   --   --  173  HEPARINUNFRC 0.30  --   --   --  0.19*  --  0.32  CREATININE 1.08  --  0.77  --   --   --  0.75   < > = values in this interval not displayed.    Estimated Creatinine Clearance: 108.1 mL/min (by C-G formula based on SCr of 0.75 mg/dL).   Medical History: Past Medical History:  Diagnosis Date  . Anxiety   . Coronary artery disease   . Myocardial infarction [facility])     Medications:  Medications Prior to Admission  Medication Sig Dispense Refill Last Dose  . ALPRAZolam (XANAX) 0.5 MG tablet Take 0.5 mg by mouth at bedtime as needed for sleep.    06/24/2020  . Multiple Vitamins-Minerals (CENTRUM ULTRA MENS) TABS Take 1 tablet by mouth.   06/24/2020  . NARCAN 4 MG/0.1ML LIQD nasal spray kit Place 1 spray into the nose once.    unknown  . traZODone (DESYREL) 100 MG tablet Take 100 mg by mouth at bedtime.   06/24/2020    Assessment: 49 y.o. M presented as STEMI - s/p angioplasty. Cardiac surgery consulted for consideration of multivessel CABG - once more stable. Tirofiban started on admission. Patient became more uncomfortable, concern for early cardiogenic shock. Stat echo showed LV thrombus, taken to cath lab for possible mechanical support. Good cardiac output on relook, no balloon pump placed, starting inotropes.   Heparin level 0.32 at gaol on  on heparin drip rate 1950 units/hr, no IV  line problems - discussed with RN, intubated, no bleeding observed, h/h stable  Plan:  Continue heparin drip 1950 units/hr Daily HL, CBC    Bonnita Nasuti Pharm.D. CPP, BCPS Clinical Pharmacist [phone] 06/30/2020 4:11 PM

## 2020-06-30 NOTE — Procedures (Signed)
Extubation Procedure Note  Patient Details:   Name: Jerry [name] DOB: December 20, 1970 MRN: [id number]   Airway Documentation:    Vent end date: 06/30/20 Vent end time: 1052   Evaluation  O2 sats: stable throughout Complications: No apparent complications Patient did tolerate procedure well. Bilateral Breath Sounds: Clear, Diminished   Yes  6l/min [place] Incentive spirometer instructed   Newt Lukes 06/30/2020, 10:52 AM

## 2020-06-30 NOTE — Progress Notes (Signed)
NAME:  Jerry [name], MRN:  [id number], DOB:  1971-04-23, LOS: 5 ADMISSION DATE:  06/25/2020, CONSULTATION DATE:  10/3 REFERRING MD:  Shirlee Latch, CHIEF COMPLAINT:  hypoxia   Brief History   STEMI with LV thrombus, pericarditis  History of present illness   Jerry [name] is a 49 y/o gentleman who presented on 10/1 with chest pain found to have an acute inferior STEMI. He was found to have acute distal RCA occlusion treated with aspiration thrombectomy and balloon angioplasty. He has a CTO LAD with collaterals, severe complex stenossi of the proximal LCx, and severe global and segmental LV dysfunction with elevated LVEDP. His course has been complicated by respiratory failure due to pulmonary edema, pericarditis, and LV thrombus. Today he has had progressive tachycardia, hypotension, and ongoing severe anterior chest pain that is only improved with leaning forward. He has had colchicine, aspirin, and morphine without significant relief in his chest pain. Never smoker, no previous PMH.  Past Medical History    Significant Hospital Events   LHC with aspiration thrombectomy and balloon angioplasty on 10/1  Consults:  PCCM Cardiology  Procedures:  LHC 10/1  Significant Diagnostic Tests:  Echo: LVEF 25-30%, LV thrombus,   Micro Data:  Blood cx 10/3> MRSa negative covid negative  Antimicrobials:  Cefepime 10/3> Bactrim 10/3> vancomycin 10/3>  Interim history/subjective:  Sitting up in bed more alert and able to follow commands on fentanyl drip.  Reports discomfort from ETT but denies any chest pain No acute events overnight   Objective   Blood pressure 98/65, pulse 73, temperature 99.3 F (37.4 C), resp. rate 20, height 5\' 8"  (1.727 m), weight 76.7 kg, SpO2 98 %. PAP: (19-76)/(13-39) 39/19 CVP:  [0 mmHg-15 mmHg] 4 mmHg CO:  [4.7 L/min-5.6 L/min] 5.3 L/min CI:  [2.5 L/min/m2-3 L/min/m2] 2.8 L/min/m2  Vent Mode: PRVC FiO2 (%):  [30 %] 30 % Set Rate:  [20 bmp-50 bmp] 20 bmp Vt Set:   [540 mL] 540 mL PEEP:  [5 cmH20] 5 cmH20 Pressure Support:  [5 cmH20] 5 cmH20 Plateau Pressure:  [13 cmH20-15 cmH20] 14 cmH20   Intake/Output Summary (Last 24 hours) at 06/30/2020 0710 Last data filed at 06/30/2020 0700 Gross per 24 hour  Intake 3623.81 ml  Output 4425 ml  Net -801.19 ml   Filed Weights   06/27/20 0500 06/28/20 0500 06/29/20 0316  Weight: 76.2 kg 70.2 kg 76.7 kg    Examination: General: Well developed adult male lying in bed in mild discomfort from ETT but no acute distress HEENT: ETT, MM pink/moist, PERRL,  Neuro: Alert and able to follow commands with ETT in place, non-focal  CV: s1s2 regular rate and rhythm, no murmur, rubs, or gallops,  PULM:  Bilateral rhonchi to bases, tolerating ETT currently, mild increased rate due to discomfort,  GI: soft, bowel sounds active in all 4 quadrants, non-tender, non-distended Extremities: warm/dry, no edema  Skin: no rashes or lesions  Resolved Hospital Problem list    Assessment & Plan:  Cardiogenic shock due to acute decompensated heart failure from ICM Acute inferior STEMI CTO LAD, CAD of LCx as well Pericarditis P: Continue vasopressor support with norepinephrine and milrinone for MAP goal > 65 Continue heparin and daily aspirin  Tentative plan for CABG once stabilized  Unable to use steroids due to increased LV rupture  Continue Colchicine  Daily atorvastatin  Continue to hold spironolactone  Unable to utilize goal directed therapy including beta blocker, ACE or ARB given continued shock Diurese per cardiology  Ensure adequate  pain control/sedtation  Encourage frequent pulmonary hygiene  Acute hypoxic respiratory failure due to cardiogenic pulmonary edema RLL pneumonia P: Tolerated sedation and vent wean, extubate today  Adequate pain control to encourage adequate pulmonary hygiene Head of bed elevated 30 degrees Follow intermittent chest x-ray and ABG  Follow cultures  Continue broad spectrum  antibiotics  Trend CBC and fever curve  Hyperglycemia -controlled P: Continue SSI CBG q4hrs  CBG goal 104-180  Risk for malnutrition P: Dietary and SLP eval post extubation   Hypophosphatemia P: Trend  Supplement as needed   Acute anemia, likely due to acute illness P: Trend CBC Transfuse per protocol  Hgb goal >8  Best practice:  Diet: NPO Pain/Anxiety/Delirium protocol (if indicated): yes VAP protocol (if indicated): yes DVT prophylaxis: heparin GI prophylaxis: pantoprazole Glucose control: per primary Mobility: bedrest Code Status: full Family Communication: Family updated at bedside  Disposition: ICU  Labs   CBC: Recent Labs  Lab 06/27/20 0226 06/27/20 0226 06/27/20 2011 06/27/20 2011 06/28/20 0500 06/28/20 0521 06/29/20 0436 06/29/20 0458 06/30/20 0404  WBC 25.5*  --  30.8*  --  27.3*  --  14.6*  --  12.6*  HGB 14.1   < > 13.7   < > 12.9* 13.3 11.5* 16.3 11.6*  HCT 43.6   < > 42.2   < > 39.5 39.0 36.2* 48.0 35.6*  MCV 90.6  --  89.8  --  90.0  --  92.1  --  91.3  PLT 212  --  243  --  231  --  173  --  173   < > = values in this interval not displayed.    Basic Metabolic Panel: Recent Labs  Lab 06/27/20 0226 06/27/20 0226 06/27/20 2011 06/27/20 2011 06/28/20 0500 06/28/20 0521 06/28/20 1614 06/29/20 0436 06/29/20 0458 06/29/20 1649 06/30/20 0404  NA 136   < > 135   < > 135 136  --  136 137  --  134*  K 3.5   < > 4.2   < > 4.0 4.4  --  4.4 4.2  --  4.1  CL 100  --  100  --  101  --   --  101  --   --  98  CO2 24  --  23  --  24  --   --  26  --   --  27  GLUCOSE 112*  --  149*  --  151*  --   --  159*  --   --  143*  BUN 18  --  21*  --  20  --   --  20  --   --  17  CREATININE 1.06  --  1.17  --  1.08  --   --  0.77  --   --  0.75  CALCIUM 8.7*  --  8.1*  --  8.0*  --   --  7.9*  --   --  8.1*  MG  --   --   --   --  1.9  --  2.5* 2.3  --  2.0  --   PHOS  --   --   --   --   --   --  2.2* 2.4*  --  1.7*  --    < > = values in this  interval not displayed.   GFR: Estimated Creatinine Clearance: 108.1 mL/min (by C-G formula based on SCr of 0.75 mg/dL). Recent Labs  Lab  06/27/20 0226 06/27/20 1106 06/27/20 1134 06/27/20 1544 06/27/20 1758 06/27/20 2011 06/27/20 2121 06/28/20 0500 06/29/20 0436 06/29/20 1555 06/30/20 0404  PROCALCITON  --  1.21  --   --   --   --   --   --   --  0.94  --   WBC   < >  --   --   --   --  30.8*  --  27.3* 14.6*  --  12.6*  LATICACIDVEN  --   --  1.2 1.5 1.2  --  1.2  --   --   --   --    < > = values in this interval not displayed.    Liver Function Tests: No results for input(s): AST, ALT, ALKPHOS, BILITOT, PROT, ALBUMIN in the last 168 hours. No results for input(s): LIPASE, AMYLASE in the last 168 hours. No results for input(s): AMMONIA in the last 168 hours.  ABG    Component Value Date/Time   PHART 7.396 06/29/2020 0458   PCO2ART 53.2 (H) 06/29/2020 0458   PO2ART 100 06/29/2020 0458   HCO3 32.3 (H) 06/29/2020 0458   TCO2 34 (H) 06/29/2020 0458   ACIDBASEDEF 1.0 06/26/2020 0029   O2SAT 61.7 06/30/2020 0404     Coagulation Profile: Recent Labs  Lab 06/25/20 2334  INR 1.2    Cardiac Enzymes: No results for input(s): CKTOTAL, CKMB, CKMBINDEX, TROPONINI in the last 168 hours.  HbA1C: Hgb A1c MFr Bld  Date/Time Value Ref Range Status  06/25/2020 11:34 PM 5.6 4.8 - 5.6 % Final    Comment:    (NOTE) Pre diabetes:          5.7%-6.4%  Diabetes:              >6.4%  Glycemic control for   <7.0% adults with diabetes     CBG: Recent Labs  Lab 06/29/20 1117 06/29/20 1527 06/29/20 1953 06/29/20 2353 06/30/20 0428  GLUCAP 106* 115* 152* 180* 129*    CRITICAL CARE Performed by: Delfin Gant  Total critical care time: 37 minutes  Critical care time was exclusive of separately billable procedures and treating other patients.  Critical care was necessary to treat or prevent imminent or life-threatening deterioration.  Critical care was time spent  personally by me on the following activities: development of treatment plan with patient and/or surrogate as well as nursing, discussions with consultants, evaluation of patient's response to treatment, examination of patient, obtaining history from patient or surrogate, ordering and performing treatments and interventions, ordering and review of laboratory studies, ordering and review of radiographic studies, pulse oximetry and re-evaluation of patient's condition.  Delfin Gant, NP-C [facility] Contact / Pager information can be found on Amion  06/30/2020, 9:39 AM

## 2020-07-01 ENCOUNTER — Inpatient Hospital Stay (HOSPITAL_COMMUNITY): Payer: 59

## 2020-07-01 ENCOUNTER — Inpatient Hospital Stay: Payer: Self-pay

## 2020-07-01 DIAGNOSIS — J9601 Acute respiratory failure with hypoxia: Secondary | ICD-10-CM | POA: Diagnosis not present

## 2020-07-01 DIAGNOSIS — F13239 Sedative, hypnotic or anxiolytic dependence with withdrawal, unspecified: Secondary | ICD-10-CM

## 2020-07-01 DIAGNOSIS — I5043 Acute on chronic combined systolic (congestive) and diastolic (congestive) heart failure: Secondary | ICD-10-CM | POA: Diagnosis not present

## 2020-07-01 DIAGNOSIS — I2111 ST elevation (STEMI) myocardial infarction involving right coronary artery: Secondary | ICD-10-CM | POA: Diagnosis not present

## 2020-07-01 DIAGNOSIS — R570 Cardiogenic shock: Secondary | ICD-10-CM | POA: Diagnosis not present

## 2020-07-01 DIAGNOSIS — J181 Lobar pneumonia, unspecified organism: Secondary | ICD-10-CM | POA: Diagnosis not present

## 2020-07-01 LAB — GLUCOSE, CAPILLARY
Glucose-Capillary: 112 mg/dL — ABNORMAL HIGH (ref 70–99)
Glucose-Capillary: 136 mg/dL — ABNORMAL HIGH (ref 70–99)
Glucose-Capillary: 121 mg/dL — ABNORMAL HIGH (ref 70–99)
Glucose-Capillary: 133 mg/dL — ABNORMAL HIGH (ref 70–99)
Glucose-Capillary: 125 mg/dL — ABNORMAL HIGH (ref 70–99)
Glucose-Capillary: 93 mg/dL (ref 70–99)

## 2020-07-01 LAB — CBC
HCT: 38 % — ABNORMAL LOW (ref 39.0–52.0)
Hemoglobin: 12.3 g/dL — ABNORMAL LOW (ref 13.0–17.0)
MCH: 29.1 pg (ref 26.0–34.0)
MCHC: 32.4 g/dL (ref 30.0–36.0)
MCV: 90 fL (ref 80.0–100.0)
Platelets: 215 10*3/uL (ref 150–400)
RBC: 4.22 MIL/uL (ref 4.22–5.81)
RDW: 12.7 % (ref 11.5–15.5)
WBC: 15.6 10*3/uL — ABNORMAL HIGH (ref 4.0–10.5)
nRBC: 0 % (ref 0.0–0.2)

## 2020-07-01 LAB — CULTURE, RESPIRATORY W GRAM STAIN: Culture: NORMAL

## 2020-07-01 LAB — BASIC METABOLIC PANEL
Anion gap: 13 (ref 5–15)
BUN: 15 mg/dL (ref 6–20)
CO2: 27 mmol/L (ref 22–32)
Calcium: 8.2 mg/dL — ABNORMAL LOW (ref 8.9–10.3)
Chloride: 95 mmol/L — ABNORMAL LOW (ref 98–111)
Creatinine, Ser: 0.94 mg/dL (ref 0.61–1.24)
GFR calc non Af Amer: >60 mL/min (ref >60)
Glucose, Bld: 133 mg/dL — ABNORMAL HIGH (ref 70–99)
Potassium: 3.5 mmol/L (ref 3.5–5.1)
Sodium: 135 mmol/L (ref 135–145)

## 2020-07-01 LAB — COOXEMETRY PANEL
Carboxyhemoglobin: 1.3 % (ref 0.5–1.5)
Methemoglobin: 0.7 % (ref 0.0–1.5)
O2 Saturation: 69.5 %
Total hemoglobin: 12.9 g/dL (ref 12.0–16.0)

## 2020-07-01 LAB — HEPARIN LEVEL (UNFRACTIONATED): Heparin Unfractionated: 0.21 IU/mL — ABNORMAL LOW (ref 0.30–0.70)

## 2020-07-01 MED ORDER — TRAZODONE HCL 50 MG PO TABS
100.0000 mg | ORAL_TABLET | Freq: Every day | ORAL | Status: DC
  Administered 2020-07-01 – 2020-07-06 (×6): 100 mg via ORAL
  Filled 2020-07-01 (×6): qty 2

## 2020-07-01 MED ORDER — PANTOPRAZOLE SODIUM 40 MG PO TBEC
40.0000 mg | DELAYED_RELEASE_TABLET | Freq: Every day | ORAL | Status: DC
  Administered 2020-07-01 – 2020-07-06 (×6): 40 mg via ORAL
  Filled 2020-07-01 (×6): qty 1

## 2020-07-01 MED ORDER — ASPIRIN 325 MG PO TABS
325.0000 mg | ORAL_TABLET | Freq: Two times a day (BID) | ORAL | Status: DC
  Administered 2020-07-01 – 2020-07-06 (×11): 325 mg via ORAL
  Filled 2020-07-01 (×11): qty 1

## 2020-07-01 MED ORDER — COLCHICINE 0.6 MG PO TABS
0.6000 mg | ORAL_TABLET | Freq: Two times a day (BID) | ORAL | Status: DC
  Administered 2020-07-01 – 2020-07-06 (×12): 0.6 mg via ORAL
  Filled 2020-07-01 (×12): qty 1

## 2020-07-01 MED ORDER — SODIUM CHLORIDE 0.9% FLUSH: 10.0000 mL | INTRAVENOUS | Status: DC | PRN

## 2020-07-01 MED ORDER — ENSURE ENLIVE PO LIQD
237.0000 mL | Freq: Three times a day (TID) | ORAL | Status: DC
  Administered 2020-07-01 – 2020-07-06 (×7): 237 mL via ORAL

## 2020-07-01 MED ORDER — GUAIFENESIN-DM 100-10 MG/5ML PO SYRP
10.0000 mL | ORAL_SOLUTION | ORAL | Status: DC | PRN
  Administered 2020-07-01 – 2020-07-05 (×4): 10 mL via ORAL
  Filled 2020-07-01 (×4): qty 10

## 2020-07-01 MED ORDER — ATORVASTATIN CALCIUM 80 MG PO TABS
80.0000 mg | ORAL_TABLET | Freq: Every day | ORAL | Status: DC
  Administered 2020-07-01 – 2020-07-06 (×6): 80 mg via ORAL
  Filled 2020-07-01 (×6): qty 1

## 2020-07-01 MED ORDER — DIGOXIN 125 MCG PO TABS
0.1250 mg | ORAL_TABLET | Freq: Every day | ORAL | Status: DC
  Administered 2020-07-01 – 2020-07-06 (×6): 0.125 mg via ORAL
  Filled 2020-07-01 (×6): qty 1

## 2020-07-01 MED ORDER — DOCUSATE SODIUM 50 MG/5ML PO LIQD
100.0000 mg | Freq: Two times a day (BID) | ORAL | Status: DC
  Administered 2020-07-01 – 2020-07-05 (×4): 100 mg via ORAL
  Filled 2020-07-01 (×6): qty 10

## 2020-07-01 MED ORDER — POLYETHYLENE GLYCOL 3350 17 G PO PACK
17.0000 g | PACK | Freq: Every day | ORAL | Status: DC
  Administered 2020-07-01: 17 g via ORAL
  Filled 2020-07-01 (×2): qty 1

## 2020-07-01 MED ORDER — ACETAMINOPHEN 325 MG PO TABS
650.0000 mg | ORAL_TABLET | Freq: Four times a day (QID) | ORAL | Status: DC | PRN
  Administered 2020-07-01: 650 mg via ORAL
  Filled 2020-07-01: qty 2

## 2020-07-01 MED ORDER — PHENOL 1.4 % MT LIQD
1.0000 | OROMUCOSAL | Status: DC | PRN
  Administered 2020-07-01: 1 via OROMUCOSAL
  Filled 2020-07-01: qty 177

## 2020-07-01 MED ORDER — IVABRADINE HCL 5 MG PO TABS
5.0000 mg | ORAL_TABLET | Freq: Two times a day (BID) | ORAL | Status: DC
  Administered 2020-07-01 – 2020-07-05 (×8): 5 mg via ORAL
  Filled 2020-07-01 (×9): qty 1

## 2020-07-01 MED ORDER — POTASSIUM CHLORIDE CRYS ER 20 MEQ PO TBCR
40.0000 meq | EXTENDED_RELEASE_TABLET | Freq: Once | ORAL | Status: AC
  Administered 2020-07-01: 40 meq via ORAL
  Filled 2020-07-01: qty 2

## 2020-07-01 MED ORDER — SODIUM CHLORIDE 0.9% FLUSH
10.0000 mL | Freq: Two times a day (BID) | INTRAVENOUS | Status: DC
  Administered 2020-07-01 – 2020-07-05 (×9): 10 mL

## 2020-07-01 MED ORDER — ADULT MULTIVITAMIN W/MINERALS CH
1.0000 | ORAL_TABLET | Freq: Every day | ORAL | Status: DC
  Administered 2020-07-01 – 2020-07-06 (×6): 1 via ORAL
  Filled 2020-07-01 (×6): qty 1

## 2020-07-01 NOTE — Progress Notes (Addendum)
CARDIAC REHAB PHASE I   Preop education completed with pt and family member. IS at bedside, encouraged continued use. Pt given cardiac surgery booklet along with OHS guidelines and in-the-tube sheet. Reviewed importance of ambulation, sternal precautions and IS use post-op. Support and encouragement provided. Will continue to follow pt.  [id number] Reynold Bowen, RN BSN 07/01/2020 10:39 AM

## 2020-07-01 NOTE — Progress Notes (Signed)
[place] for heparin Indication: LV thrombus  No Known Allergies  Patient Measurements: Height: 5' 8" (172.7 cm) Weight: 74.9 kg (165 lb 2 oz) IBW/kg (Calculated) : 68.4  Vital Signs: Temp: 98.6 F (37 C) (10/07 0700) Temp Source: Core (10/07 0400) BP: 80/56 (10/07 0700) Pulse Rate: 84 (10/07 0700)  Labs: Recent Labs    06/29/20 0436 06/29/20 0436 06/29/20 0437 06/29/20 0458 06/29/20 0458 06/30/20 0404 07/01/20 0352  HGB 11.5*   < >  --  16.3   < > 11.6* 12.3*  HCT 36.2*   < >  --  48.0  --  35.6* 38.0*  PLT 173  --   --   --   --  173 215  HEPARINUNFRC  --   --  0.19*  --   --  0.32 0.21*  CREATININE 0.77  --   --   --   --  0.75 0.94   < > = values in this interval not displayed.    Estimated Creatinine Clearance: 92 mL/min (by C-G formula based on SCr of 0.94 mg/dL).   Medical History: Past Medical History:  Diagnosis Date  . Anxiety   . Coronary artery disease   . Myocardial infarction [facility])     Medications:  Medications Prior to Admission  Medication Sig Dispense Refill Last Dose  . ALPRAZolam (XANAX) 0.5 MG tablet Take 0.5 mg by mouth at bedtime as needed for sleep.    06/24/2020  . Multiple Vitamins-Minerals (CENTRUM ULTRA MENS) TABS Take 1 tablet by mouth.   06/24/2020  . NARCAN 4 MG/0.1ML LIQD nasal spray kit Place 1 spray into the nose once.    unknown  . traZODone (DESYREL) 100 MG tablet Take 100 mg by mouth at bedtime.   06/24/2020    Assessment: 49 y.o. M presented as STEMI - s/p angioplasty. Cardiac surgery consulted for consideration of multivessel CABG - once more stable. Heparin continued for LV thrombus on ECHO 10/2 (only smoke seen on repeat 10/4). Of note, pt had recent back surgery so utilizing lower heparin level goal.  Heparin level subtherapeutic at 0.21, CBC stable.  Goals: Heparin level 0.3-0.5 units/ml   Plan:  Increase heparin to 2100 units/h Daily heparin level and CBC   Arrie Senate,  PharmD, BCPS Clinical Pharmacist [phone] Please check AMION for all [place] numbers 07/01/2020

## 2020-07-01 NOTE — Progress Notes (Signed)
This chaplain phoned RN-Kaitlin with the update, notary is not available at this time. Chaplain will F/U when able to complete the Pt. AD.

## 2020-07-01 NOTE — Progress Notes (Signed)
Assisted tele visit to patient with family member.  ,  P, RN  

## 2020-07-01 NOTE — Progress Notes (Signed)
Nutrition Follow-up  DOCUMENTATION CODES:   Non-severe (moderate) malnutrition in context of acute illness/injury  INTERVENTION:    Ensure Enlive po TID, each supplement provides 350 kcal and 20 grams of protein.   MVI with minerals daily.  NUTRITION DIAGNOSIS:   Moderate Malnutrition related to acute illness (recent back surgery) as evidenced by mild muscle depletion, moderate muscle depletion, mild fat depletion, moderate fat depletion.  Ongoing  GOAL:   Patient will meet greater than or equal to 90% of their needs  Progressing  MONITOR:   PO intake, Supplement acceptance  REASON FOR ASSESSMENT:   Ventilator, Consult Enteral/tube feeding initiation and management  ASSESSMENT:   49 yo male admitted with STEMI with LV thrombus. S/P aspiration thrombectomy and balloon angioplasty 10/1. No significant PMH.    Discussed patient in ICU rounds and with RN today. Patient was extubated 10/6. Diet advanced to heart healthy; patient ate 25% of breakfast today.  Bowel regimen has been added. Plans for surgery on hold for a few days because patient is infectious. Patient reports poor appetite. Agreed to try Ensure supplements.  Labs reviewed.  CBG: 136-121  Medications reviewed and include colace, novolog, miralax, precedex, levophed.  I/O -4.2 L since admission UOP 3,105 ml x 24 hr  Diet Order:   Diet Order            Diet Heart Room service appropriate? Yes; Fluid consistency: Thin  Diet effective now                  EDUCATION NEEDS:   Not appropriate for education at this time  Skin:  Skin Assessment: Skin Integrity Issues: Skin Integrity Issues:: Incisions Incisions: open vertebral column from recent back surgery  Last BM:  9/29  Height:   Ht Readings from Last 1 Encounters:  06/25/20 5\' 8"  (1.727 m)    Weight:   Wt Readings from Last 1 Encounters:  07/01/20 74.9 kg    Ideal Body Weight:  70 kg  BMI:  Body mass index is 25.11  kg/m.  Estimated Nutritional Needs:   Kcal:  2126  Protein:  100-120 gm  Fluid:  >/= 2 L    2127, RD, LDN, CNSC Please refer to Amion for contact information.

## 2020-07-01 NOTE — Progress Notes (Signed)
This chaplain is present with the Pt., notary and witnesses to facilitate the signing of the Pt. HCPOA and Living Will.  The Pt. named [place] as his Health Care Agent  [phone].  The chaplain gave the Pt. the original Advance Directive and one copy.  A copy was placed in the Pt. medical chart.  F/U spiritual care is available as needed.

## 2020-07-01 NOTE — Progress Notes (Signed)
NAME:  Jerry [name], MRN:  [id number], DOB:  04-03-1971, LOS: 6 ADMISSION DATE:  06/25/2020, CONSULTATION DATE:  10/3 REFERRING MD:  Shirlee Latch, CHIEF COMPLAINT:  hypoxia   Brief History   STEMI with LV thrombus, pericarditis  History of present illness   Jerry [name] is a 49 y/o gentleman who presented on 10/1 with chest pain found to have an acute inferior STEMI. He was found to have acute distal RCA occlusion treated with aspiration thrombectomy and balloon angioplasty. He has a CTO LAD with collaterals, severe complex stenossi of the proximal LCx, and severe global and segmental LV dysfunction with elevated LVEDP. His course has been complicated by respiratory failure due to pulmonary edema, pericarditis, and LV thrombus. On 10/3, he had progressive tachycardia, hypotension, and ongoing severe anterior chest pain that is only improved with leaning forward. He has had colchicine, aspirin, and morphine without significant relief in his chest pain. Never smoker, no previous PMH.  Past Medical History    Significant Hospital Events   LHC with aspiration thrombectomy and balloon angioplasty on 10/1  Consults:  PCCM Cardiology  Procedures:  LHC 10/1  Significant Diagnostic Tests:  Echo: LVEF 25-30%, LV thrombus Repeat echo 10/3: LV thrombus not seen, 25% LVEF CXR: 10/7 Persistent pleural effusions with patchy airspace opacity in the mid and lower lung regions  Micro Data:  Blood cx 10/3> NGx3D MRSa negative covid negative  Antimicrobials:  Cefepime 10/3> Bactrim 10/3>10/3 vancomycin 10/3>  Interim history/subjective:  Sitting up in bed talking and eating small bites of breakfast, on Precedex.  Reports some sore throat that he notices with swallowing and speaking but denies difficulty with swallowing. Denies significant chest pain or difficulty breathing. Has not had any bowel movements, denies fever or chills. Denies headache, changes in vision.  Objective   Blood pressure (!)  80/56, pulse 84, temperature 98.6 F (37 C), resp. rate 17, height 5\' 8"  (1.727 m), weight 74.9 kg, SpO2 98 %. PAP: (19-58)/(8-29) 42/21 CVP:  [0 mmHg-13 mmHg] 7 mmHg CO:  [6.2 L/min] 6.2 L/min CI:  [3.3 L/min/m2] 3.3 L/min/m2      Intake/Output Summary (Last 24 hours) at 07/01/2020 08/31/2020 Last data filed at 07/01/2020 08/31/2020 Gross per 24 hour  Intake 1774.37 ml  Output 2985 ml  Net -1210.63 ml   Filed Weights   06/28/20 0500 06/29/20 0316 07/01/20 0500  Weight: 70.2 kg 76.7 kg 74.9 kg    Examination: General: Ill appearing adult male sitting up in bed in NAD HEENT: PERRLA, EOM intact, MMM  Neuro: AOx4 CV: RRR, S1 S2, no m/r/g, 2+ radial and DP pulses PULM:  CTABL, normal WOB GI: soft, bowel sounds active in all 4 quadrants, non-tender, non-distended Extremities: warm/dry, no edema  Skin: no rashes or lesions  Resolved Hospital Problem list    Assessment & Plan:  Cardiogenic shock due to acute decompensated heart failure from ICM Acute inferior STEMI CTO LAD, CAD of LCx as well Pericarditis P: Continue vasopressor support with norepinephrine and milrinone for MAP goal > 65 Continue heparin and daily aspirin  Tentative plan for CABG once stabilized  Unable to use steroids due to increased LV rupture  Continue Colchicine  Daily atorvastatin  Continue to hold spironolactone  Unable to utilize goal directed therapy including beta blocker, ACE or ARB given continued shock Diurese per cardiology  Ensure adequate pain control/sedtation  Encourage frequent pulmonary hygiene  Acute hypoxic respiratory failure due to cardiogenic pulmonary edema RLL pneumonia  High risk for pulmonary complication per  ARISCAT score, but clinically stable on 5L [place] O2 and clear lung fields on auscultation. Now on 5 days of abx for PNA with CXR stable. P: Wean Oxygenation as tolerated Adequate pain control to encourage adequate pulmonary hygiene Head of bed elevated 30 degrees Follow intermittent  chest x-ray and ABG  Follow cultures  Continue broad spectrum antibiotics  Trend CBC and fever curve  Agitation/restlessness: during admission On Xanax qhs at home  P: Ativan 0.5 mg TID prn  Hyperglycemia -controlled P: Continue SSI CBG q4hrs  CBG goal 104-180  Nutrition/Bowel No BM in last 3 days, potential surgery Friday 10/8 P: PO intake as tolerated Miralax and senna  Hypophosphatemia P: Trend  Supplement as needed   Acute anemia, likely due to acute illness P: Trend CBC Transfuse per protocol  Hgb goal >8  Best practice:  Diet: NPO at midnight  Pain/Anxiety/Delirium protocol (if indicated): yes VAP protocol (if indicated): yes DVT prophylaxis: heparin GI prophylaxis: pantoprazole Glucose control: per primary Mobility: bedrest Code Status: full Family Communication: Family updated at bedside  Disposition: ICU  Labs   CBC: Recent Labs  Lab 06/27/20 2011 06/27/20 2011 06/28/20 0500 06/28/20 0500 06/28/20 0521 06/29/20 0436 06/29/20 0458 06/30/20 0404 07/01/20 0352  WBC 30.8*  --  27.3*  --   --  14.6*  --  12.6* 15.6*  HGB 13.7   < > 12.9*   < > 13.3 11.5* 16.3 11.6* 12.3*  HCT 42.2   < > 39.5   < > 39.0 36.2* 48.0 35.6* 38.0*  MCV 89.8  --  90.0  --   --  92.1  --  91.3 90.0  PLT 243  --  231  --   --  173  --  173 215   < > = values in this interval not displayed.    Basic Metabolic Panel: Recent Labs  Lab 06/27/20 2011 06/27/20 2011 06/28/20 0500 06/28/20 0500 06/28/20 0521 06/28/20 1614 06/29/20 0436 06/29/20 0458 06/29/20 1649 06/30/20 0404 07/01/20 0352  NA 135   < > 135   < > 136  --  136 137  --  134* 135  K 4.2   < > 4.0   < > 4.4  --  4.4 4.2  --  4.1 3.5  CL 100  --  101  --   --   --  101  --   --  98 95*  CO2 23  --  24  --   --   --  26  --   --  27 27  GLUCOSE 149*  --  151*  --   --   --  159*  --   --  143* 133*  BUN 21*  --  20  --   --   --  20  --   --  17 15  CREATININE 1.17  --  1.08  --   --   --  0.77   --   --  0.75 0.94  CALCIUM 8.1*  --  8.0*  --   --   --  7.9*  --   --  8.1* 8.2*  MG  --   --  1.9  --   --  2.5* 2.3  --  2.0 2.2  --   PHOS  --   --   --   --   --  2.2* 2.4*  --  1.7* 3.3  --    < > = values  in this interval not displayed.   GFR: Estimated Creatinine Clearance: 92 mL/min (by C-G formula based on SCr of 0.94 mg/dL). Recent Labs  Lab 06/27/20 1106 06/27/20 1134 06/27/20 1544 06/27/20 1758 06/27/20 2011 06/27/20 2121 06/28/20 0500 06/29/20 0436 06/29/20 1555 06/30/20 0404 07/01/20 0352  PROCALCITON 1.21  --   --   --   --   --   --   --  0.94  --   --   WBC  --   --   --   --    < >  --  27.3* 14.6*  --  12.6* 15.6*  LATICACIDVEN  --  1.2 1.5 1.2  --  1.2  --   --   --   --   --    < > = values in this interval not displayed.    Liver Function Tests: No results for input(s): AST, ALT, ALKPHOS, BILITOT, PROT, ALBUMIN in the last 168 hours. No results for input(s): LIPASE, AMYLASE in the last 168 hours. No results for input(s): AMMONIA in the last 168 hours.  ABG    Component Value Date/Time   PHART 7.396 06/29/2020 0458   PCO2ART 53.2 (H) 06/29/2020 0458   PO2ART 100 06/29/2020 0458   HCO3 32.3 (H) 06/29/2020 0458   TCO2 34 (H) 06/29/2020 0458   ACIDBASEDEF 1.0 06/26/2020 0029   O2SAT 69.5 07/01/2020 0352     Coagulation Profile: Recent Labs  Lab 06/25/20 2334  INR 1.2    Cardiac Enzymes: No results for input(s): CKTOTAL, CKMB, CKMBINDEX, TROPONINI in the last 168 hours.  HbA1C: Hgb A1c MFr Bld  Date/Time Value Ref Range Status  06/25/2020 11:34 PM 5.6 4.8 - 5.6 % Final    Comment:    (NOTE) Pre diabetes:          5.7%-6.4%  Diabetes:              >6.4%  Glycemic control for   <7.0% adults with diabetes     CBG: Recent Labs  Lab 06/30/20 1535 06/30/20 1940 06/30/20 2309 07/01/20 0346 07/01/20 0642  GLUCAP 91 115* 84 112* 136*    Olene Craven, MS4

## 2020-07-01 NOTE — Progress Notes (Signed)
4 Days Post-Op Procedure(s): INVASIVE LAB ABORTED CASE Subjective: Feels better today Less short of breath. Pain improved  Objective: Vital signs in last 24 hours: Temp:  [97.5 F (36.4 C)-102.2 F (39 C)] 99.1 F (37.3 C) (10/07 1115) Pulse Rate:  [35-165] 84 (10/07 1300) Cardiac Rhythm: Normal sinus rhythm (10/07 0800) Resp:  [10-31] 27 (10/07 1300) BP: (60-129)/(46-102) 101/63 (10/07 1245) SpO2:  [88 %-100 %] 96 % (10/07 1300) Weight:  [74.9 kg] 74.9 kg (10/07 0500)  Hemodynamic parameters for last 24 hours: PAP: (13-58)/(5-29) 17/8 CVP:  [0 mmHg-24 mmHg] 24 mmHg CO:  [6.2 L/min] 6.2 L/min CI:  [3.3 L/min/m2] 3.3 L/min/m2  Intake/Output from previous day: 10/06 0701 - 10/07 0700 In: 2130.5 [I.V.:1270.7; IV Piggyback:859.7] Out: 3105 [Urine:3105] Intake/Output this shift: Total I/O In: 710 [P.O.:360; I.V.:305.5; IV Piggyback:44.6] Out: 1400 [Urine:1400]  General appearance: alert, cooperative and mild increased WOB Neurologic: intact Heart: mild tachycardia Lungs: Diminshed BS at bases  Lab Results: Recent Labs    06/30/20 0404 07/01/20 0352  WBC 12.6* 15.6*  HGB 11.6* 12.3*  HCT 35.6* 38.0*  PLT 173 215   BMET:  Recent Labs    06/30/20 0404 07/01/20 0352  NA 134* 135  K 4.1 3.5  CL 98 95*  CO2 27 27  GLUCOSE 143* 133*  BUN 17 15  CREATININE 0.75 0.94  CALCIUM 8.1* 8.2*    PT/INR: No results for input(s): LABPROT, INR in the last 72 hours. 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 We both feel he would benefit from additional time for medical optimization before CABG. Hopefully can proceed next week  Jerry Spare C. Dorris Fetch, MD [facility] [phone]    LOS: 6 days    Loreli Slot 07/01/2020

## 2020-07-01 NOTE — Progress Notes (Signed)
   07/01/20 1400  Clinical Encounter Type  Visited With Patient and family together  Visit Type Initial  Referral From Nurse  Consult/Referral To Chaplain  The chaplain met with wife and patient at bedside. The chaplain gave the POA paperwork and discussed the components. The patient and wife are requesting a notary and volunteers so the paperwork can be completed today.   

## 2020-07-01 NOTE — Progress Notes (Signed)
Neurosurgery Service Progress Note  Subjective: febrile overnight, mtemp 102.2. No new complaints this morning.   Objective: Vitals:   07/01/20 1215 07/01/20 1230 07/01/20 1245 07/01/20 1300  BP:  100/70 101/63   Pulse: 84 84 88 84  Resp: (!) 21 20 (!) 21 (!) 27  Temp:      TempSrc:      SpO2: 96% 95% 97% 96%  Weight:      Height:       Temp (24hrs), Avg:99.8 F (37.7 C), Min:97.5 F (36.4 C), Max:102.2 F (39 C)  CBC Latest Ref Rng & Units 07/01/2020 06/30/2020 06/29/2020  WBC 4.0 - 10.5 K/uL 15.6(H) 12.6(H) -  Hemoglobin 13.0 - 17.0 g/dL 12.3(L) 11.6(L) 16.3  Hematocrit 39 - 52 % 38.0(L) 35.6(L) 48.0  Platelets 150 - 400 K/uL 215 173 -   BMP Latest Ref Rng & Units 07/01/2020 06/30/2020 06/29/2020  Glucose 70 - 99 mg/dL 342(A) 768(T) -  BUN 6 - 20 mg/dL 15 17 -  Creatinine 1.57 - 1.24 mg/dL 2.62 0.35 -  Sodium 597 - 145 mmol/L 135 134(L) 137  Potassium 3.5 - 5.1 mmol/L 3.5 4.1 4.2  Chloride 98 - 111 mmol/L 95(L) 98 -  CO2 22 - 32 mmol/L 27 27 -  Calcium 8.9 - 10.3 mg/dL 8.2(L) 8.1(L) -    Intake/Output Summary (Last 24 hours) at 07/01/2020 1352 Last data filed at 07/01/2020 1300 Gross per 24 hour  Intake 2497.8 ml  Output 3860 ml  Net -1362.2 ml    Current Facility-Administered Medications:  .  0.9 %  sodium chloride infusion, , Intravenous, Continuous, Cardama, Amadeo Garnet, MD, Stopped at 06/27/20 1726 .  0.9 %  sodium chloride infusion, 250 mL, Intravenous, PRN, Tonny Bollman, MD, Last Rate: 10 mL/hr at 06/26/20 0700, Rate Verify at 06/26/20 0700 .  acetaminophen (TYLENOL) tablet 650 mg, 650 mg, Oral, Q6H PRN, Kinnie Feil, RPH, 650 mg at 07/01/20 1238 .  ALPRAZolam Prudy Feeler) tablet 0.5 mg, 0.5 mg, Oral, TID PRN, Karie Fetch P, DO, 0.5 mg at 07/01/20 1003 .  aspirin tablet 325 mg, 325 mg, Oral, BID, Kinnie Feil, [facility] .  atorvastatin (LIPITOR) tablet 80 mg, 80 mg, Oral, Daily, Kinnie Feil, RPH, 80 mg at 07/01/20 1003 .  ceFEPIme (MAXIPIME) 2 g in sodium  chloride 0.9 % 100 mL IVPB, 2 g, Intravenous, Q8H, Earnie Larsson, RPH, Stopped at 07/01/20 0544 .  Chlorhexidine Gluconate Cloth 2 % PADS 6 each, 6 each, Topical, Daily, Tonny Bollman, MD, 6 each at 06/29/20 0830 .  colchicine tablet 0.6 mg, 0.6 mg, Oral, BID, Kinnie Feil, RPH, 0.6 mg at 07/01/20 1003 .  dexmedetomidine (PRECEDEX) 400 MCG/100ML (4 mcg/mL) infusion, 0.4-1.2 mcg/kg/hr, Intravenous, Titrated, Steffanie Dunn, DO, Last Rate: 9.53 mL/hr at 07/01/20 1300, 0.5 mcg/kg/hr at 07/01/20 1300 .  digoxin (LANOXIN) tablet 0.125 mg, 0.125 mg, Oral, Daily, Kinnie Feil, RPH, 0.125 mg at 07/01/20 1003 .  docusate (COLACE) 50 MG/5ML liquid 100 mg, 100 mg, Oral, BID, Kinnie Feil, RPH, 100 mg at 07/01/20 1002 .  feeding supplement (ENSURE ENLIVE) (ENSURE ENLIVE) liquid 237 mL, 237 mL, Oral, TID BM, Tonny Bollman, MD .  fentaNYL (SUBLIMAZE) injection 50-100 mcg, 50-100 mcg, Intravenous, Q2H PRN, Raymon Mutton F, NP, 50 mcg at 06/30/20 1342 .  heparin ADULT infusion 100 units/mL (25000 units/23mL sodium chloride 0.45%), 2,100 Units/hr, Intravenous, Continuous, Mosetta Anis, RPH, Last Rate: 21 mL/hr at 07/01/20 1300, 2,100 Units/hr at 07/01/20 1300 .  insulin aspart (novoLOG) injection 1-3 Units,  1-3 Units, Subcutaneous, Q4H, Steffanie Dunn, DO, 1 Units at 07/01/20 1238 .  ivabradine (CORLANOR) tablet 5 mg, 5 mg, Oral, BID WC, Kinnie Feil, [facility] .  milrinone (PRIMACOR) 20 MG/100 ML (0.2 mg/mL) infusion, 0.375 mcg/kg/min, Intravenous, Continuous, Laurey Morale, MD, Last Rate: 8.58 mL/hr at 07/01/20 1300, 0.375 mcg/kg/min at 07/01/20 1300 .  multivitamin with minerals tablet 1 tablet, 1 tablet, Oral, Daily, Tonny Bollman, MD, 1 tablet at 07/01/20 1242 .  nitroGLYCERIN (NITROSTAT) SL tablet 0.4 mg, 0.4 mg, Sublingual, Q5 Min x 3 PRN, Cardama, Amadeo Garnet, MD, 0.4 mg at 06/25/20 2342 .  norepinephrine (LEVOPHED) 16 mg in premix infusion, 0-40 mcg/min, Intravenous, Titrated,  Laurey Morale, MD, Last Rate: 3.75 mL/hr at 07/01/20 1300, 4 mcg/min at 07/01/20 1300 .  ondansetron (ZOFRAN) injection 4 mg, 4 mg, Intravenous, Q6H PRN, Precious Reel, MD, 4 mg at 07/01/20 1349 .  pantoprazole (PROTONIX) EC tablet 40 mg, 40 mg, Oral, Daily, Kinnie Feil, RPH .  polyethylene glycol (MIRALAX / GLYCOLAX) packet 17 g, 17 g, Oral, Daily, Kinnie Feil, RPH, 17 g at 07/01/20 1002 .  sodium chloride flush (NS) 0.9 % injection 3 mL, 3 mL, Intravenous, Q12H, Tonny Bollman, MD, 3 mL at 06/30/20 2206 .  sodium chloride flush (NS) 0.9 % injection 3 mL, 3 mL, Intravenous, PRN, Tonny Bollman, MD .  vancomycin (VANCOCIN) IVPB 1000 mg/200 mL premix, 1,000 mg, Intravenous, Q8H, Laurey Morale, MD, Stopped at 07/01/20 4536   Physical Exam: Extubated 10/6. AOx3,  FS, PERRLA, EOM intact, MAE.   Assessment & Plan: 49 y.o. s/p repeat far lateral MIS discectomy 9/29, then STEMI s/p PTCA with low EF and apical thrombus. Incision with small superficial dehiscence superiorly, no drainage noted today, no foul odor. No erythema/induration. No pus noted. The wound is dry and clean. Per nursing serous drainage occurred with wound since admission.  - pt seems to recovering well -Continue empiric vancomycin/cefepime. Continue dressing changes as needed.   Kennon Portela, NP 07/01/20 1:52 PM

## 2020-07-01 NOTE — Progress Notes (Signed)
Peripherally Inserted Central Catheter Placement  The IV Nurse has discussed with the patient and/or persons authorized to consent for the patient, the purpose of this procedure and the potential benefits and risks involved with this procedure.  The benefits include less needle sticks, lab draws from the catheter, and the patient may be discharged home with the catheter. Risks include, but not limited to, infection, bleeding, blood clot (thrombus formation), and puncture of an artery; nerve damage and irregular heartbeat and possibility to perform a PICC exchange if needed/ordered by physician.  Alternatives to this procedure were also discussed.  Bard Power PICC patient education guide, fact sheet on infection prevention and patient information card has been provided to patient /or left at bedside.    PICC Placement Documentation  PICC Triple Lumen 07/01/20 PICC Left Brachial 45 cm 0 cm (Active)  Indication for Insertion or Continuance of Line Vasoactive infusions 07/01/20 2039  Exposed Catheter (cm) 1 cm 07/01/20 2039  Site Assessment Clean;Dry;Intact 07/01/20 2039  Lumen #1 Status Flushed;Saline locked;Blood return noted 07/01/20 2039  Lumen #2 Status Flushed;Saline locked;Blood return noted 07/01/20 2039  Lumen #3 Status Flushed;Saline locked;Blood return noted 07/01/20 2039  Dressing Type Transparent 07/01/20 2039  Dressing Status Clean;Dry;Intact 07/01/20 2039  Antimicrobial disc in place? Not Applicable 07/01/20 2039  Dressing Intervention New dressing 07/01/20 2039  Dressing Change Due 07/08/20 07/01/20 2039       Ethelda Chick 07/01/2020, 8:44 PM

## 2020-07-01 NOTE — Progress Notes (Addendum)
Patient ID: Jerry [name], male   DOB: April 26, 1971, 49 y.o.   MRN: [id number]     Advanced Heart Failure Rounding Note  PCP-Cardiologist: Sherren Mocha, MD    Patient Profile   49 y/o male s/p recent back surgery, admitted for acute inferior and anterolateral MI. LHC showed thrombotic occlusion of distal RCA that was treated with PTCA and thrombectomy.  He was also noted to have chronic total occlusion of the LAD with collaterals from the right (thus this territory was affected by the RCA MI) as well as 90% complex proximal LCx stenosis. EF 25-30% + apical thrombus. RV normal. Course b/c cardiogenic shock and acute hypoxic respiratory failure. ? Component of septic shock.    Subjective:    Extubated 10/6.  Febrile overnight. mTemp 102.2. WBC trending back up, 12>>16K. Remains on Vanc + cefepime for PNA. BC and tracheal aspirate NGTD.   Remains on Milrinone 0.375 + NE 6. Co-ox 70%. CVP 4.   -3.1 L in UOP yesterday. Wt down 4 lb. CVP 4. SCr/K normal.   Continues w/ mild pleuritic c/p, mainly w/ cough.   Objective:   Weight Range: 74.9 kg Body mass index is 25.11 kg/m.   Vital Signs:   Temp:  [97.5 F (36.4 C)-102.2 F (39 C)] 98.6 F (37 C) (10/07 0700) Pulse Rate:  [35-165] 84 (10/07 0700) Resp:  [13-31] 17 (10/07 0700) BP: (60-129)/(46-90) 80/56 (10/07 0700) SpO2:  [88 %-100 %] 98 % (10/07 0700) Weight:  [74.9 kg] 74.9 kg (10/07 0500) Last BM Date: 06/23/20  Weight change: Filed Weights   06/28/20 0500 06/29/20 0316 07/01/20 0500  Weight: 70.2 kg 76.7 kg 74.9 kg    Intake/Output:   Intake/Output Summary (Last 24 hours) at 07/01/2020 0904 Last data filed at 07/01/2020 0615 Gross per 24 hour  Intake 1774.37 ml  Output 2985 ml  Net -1210.63 ml      Physical Exam    CVP 4 General: fatigue appearing male, no distress  Neck: No JVD, no thyromegaly or thyroid nodule. + Rt IJ Swan  Lungs: decreased BS bilaterally at bases, faint LLL crackles  CV: Nondisplaced PMI.   Heart regular S1/S2, no S3/S4, no murmur. Abdomen: Soft, nontender, no hepatosplenomegaly, no distention.  Skin: Intact without lesions or rashes.  Neurologic: Alert and oriented x 3.  Psych: Normal affect. Extremities: No clubbing or cyanosis. Warm. No edema  HEENT: Normal.    Telemetry   NSR 80s (personally reviewed).    EKG    No new EKG to review   Labs    CBC Recent Labs    06/30/20 0404 07/01/20 0352  WBC 12.6* 15.6*  HGB 11.6* 12.3*  HCT 35.6* 38.0*  MCV 91.3 90.0  PLT 173 903   Basic Metabolic Panel Recent Labs    06/29/20 0436 06/29/20 1649 06/30/20 0404 07/01/20 0352  NA   < >  --  134* 135  K   < >  --  4.1 3.5  CL   < >  --  98 95*  CO2   < >  --  27 27  GLUCOSE   < >  --  143* 133*  BUN   < >  --  17 15  CREATININE   < >  --  0.75 0.94  CALCIUM   < >  --  8.1* 8.2*  MG  --  2.0 2.2  --   PHOS  --  1.7* 3.3  --    < > =  values in this interval not displayed.   Liver Function Tests No results for input(s): AST, ALT, ALKPHOS, BILITOT, PROT, ALBUMIN in the last 72 hours. No results for input(s): LIPASE, AMYLASE in the last 72 hours. Cardiac Enzymes No results for input(s): CKTOTAL, CKMB, CKMBINDEX, TROPONINI in the last 72 hours.  BNP: BNP (last 3 results) Recent Labs    06/25/20 2334 06/26/20 0150  BNP 349.1* 368.4*    ProBNP (last 3 results) No results for input(s): PROBNP in the last 8760 hours.   D-Dimer No results for input(s): DDIMER in the last 72 hours. Hemoglobin A1C No results for input(s): HGBA1C in the last 72 hours. Fasting Lipid Panel No results for input(s): CHOL, HDL, LDLCALC, TRIG, CHOLHDL, LDLDIRECT in the last 72 hours. Thyroid Function Tests No results for input(s): TSH, T4TOTAL, T3FREE, THYROIDAB in the last 72 hours.  Invalid input(s): FREET3  Other results:   Imaging    DG Chest Port 1 View  Result Date: 07/01/2020 CLINICAL DATA:  Extubation.  Pleural effusions. EXAM: PORTABLE CHEST 1 VIEW  COMPARISON:  June 30, 2020 FINDINGS: Endotracheal tube and nasogastric tube have been removed. Swan-Ganz catheter tip is in the right main pulmonary artery. No pneumothorax. Postoperative change noted in the left apex. There are persistent pleural effusions bilaterally with patchy airspace opacity in the mid and lower lung regions. Heart is mildly enlarged with pulmonary vascularity normal. No adenopathy. There is aortic atherosclerosis. Postoperative changes noted in the right clavicle. IMPRESSION: Swan-Ganz catheter tip in right main pulmonary artery. No pneumothorax. Postoperative change left apex. Persistent pleural effusions with patchy airspace opacity in the mid and lower lung regions. There is likely atelectasis in these areas with questionable superimposed pulmonary edema and/or pneumonia. Appearance similar to 1 day prior. Stable cardiac silhouette. Aortic Atherosclerosis (ICD10-I70.0). Electronically Signed   By: Lowella Grip III M.D.   On: 07/01/2020 07:55     Medications:     Scheduled Medications: . aspirin  325 mg Oral BID  . atorvastatin  80 mg Oral Daily  . Chlorhexidine Gluconate Cloth  6 each Topical Daily  . colchicine  0.6 mg Oral BID  . digoxin  0.125 mg Oral Daily  . docusate  100 mg Oral BID  . insulin aspart  1-3 Units Subcutaneous Q4H  . ivabradine  5 mg Oral BID WC  . oxyCODONE-acetaminophen  1 tablet Oral Q6H  . pantoprazole  40 mg Oral Daily  . polyethylene glycol  17 g Oral Daily  . sodium chloride flush  3 mL Intravenous Q12H    Infusions: . sodium chloride Stopped (06/27/20 1726)  . sodium chloride 10 mL/hr at 06/26/20 0700  . ceFEPime (MAXIPIME) IV Stopped (07/01/20 0544)  . dexmedetomidine (PRECEDEX) IV infusion 1.2 mcg/kg/hr (07/01/20 0615)  . heparin 1,950 Units/hr (07/01/20 0615)  . milrinone 0.375 mcg/kg/min (07/01/20 0615)  . norepinephrine (LEVOPHED) Adult infusion 6 mcg/min (07/01/20 0615)  . vancomycin 200 mL/hr at 07/01/20 0615    PRN  Medications: sodium chloride, acetaminophen, ALPRAZolam, fentaNYL (SUBLIMAZE) injection, nitroGLYCERIN, ondansetron (ZOFRAN) IV, sodium chloride flush    Assessment/Plan   1. CAD: Late presentation inferior MI.  Cath showed thrombotic occlusion distal RCA, CTO LAD with collaterals from right, and complex 90% proximal LCx stenosis.  He had PTCA/thrombectomy RCA with good flow at end of procedure.  Suspect he had damage to LAD territory as well as RCA territory as RCA collateralized the LAD.   - ASA/statin.  - Eventually, would ideally have CABG.  Has been  seen by [facility]CTS.  Will be high risk in setting of depressed EF and marginal cardiac output.  Needs stabilization first. Placement of Impella to support cardiac surgery now seems feasible with no LV thrombus on last echo.   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 Suspect post-MI pericarditis. ESR elevated at 20.  Much better now, less chest pain.  - Continue ASA 325 mg bid for pericarditis  - Colchicine 0.6 bid.  - No steroids post-MI, no traditional NSAIDs.  3. Acute systolic CHF>>Cardiogenic Shock: Echo with EF 25-30%, RV ok (no evidence for RV infarct), LV thrombus.  Repeat bedside echo 10/3  EF 25%, normal RV w/ no pericardial effusion, no LV thrombus seen on this echo. Swan in place, adequate CO by thermodilution on milrinone 0.375, NE 6. Co-ox 70%. CVP down to 4. - Continue milrinone 0.375 - Continue digoxin 0.125 daily.  - Continue to wean NE  - Ivabradine controlling HR well at this point.  - Received 40 IV Lasix this am. CVP 4. Likely PO tomorrow  - To get him through eventual CABG, think he would need mechanical support.  Impella now appears to be an option with no LV thrombus on last echo.   4. LV thrombus: Resolved on 10/3 echo. On IV heparin gtt.  5. ID: Now afebrile with WBCs trending down 26>>31>>27>>15K>>12.6>>15.6.  PCT mildly elevated 0.96.  This may be effect of acute MI with post-infarct pericarditis but treating empirically w/ broad spectrum abx, on vanc + cefepime. Possible RLL PNA on CXR.  Blood cultures NGTD, pending sputum culture result.  - Continue empiric vancomycin/cefepime.  6. S/p back surgery: Micro-discectomy last week.  Neurosurgery following, appreciated.  7. Acute Hypoxic Respiratory Failure: Pulmonary edema +/- PNA. CVP down to 4.  - extubated 10/6  8. Possible benzo/narcotic withdrawal: Currently on Precedex, needs slow benzo/oxycodone taper.   Lyda Jester, PA-C  07/01/2020 9:04 AM  Patient seen with PA, agree with the above note.   Doing better today. CVP 4, co-ox 70%.  Afebrile this morning though WBCs up to 15.6.  He remains on milrinone 0.375 and norepinephrine 5.  CXR concerning for bilateral lower lobe PNA.  Still some chest pain with cough but seems much improved.   General: NAD Neck: No JVD, no thyromegaly or thyroid nodule.  Lungs: Decreased at bases. CV: Nondisplaced PMI.  Heart regular S1/S2, no S3/S4, no murmur or friction rub.  No peripheral edema.   Abdomen: Soft, nontender, no hepatosplenomegaly, no distention.  Skin: Intact without lesions or rashes.  Neurologic: Alert and oriented x 3.  Psych: Normal affect. Extremities: No clubbing or cyanosis.  HEENT: Normal.   I think we can remove swan today, will place PICC prior to follow CVP/co-ox.  He had 1 dose of IV Lasix this morning, can hold further Lasix, likely to po tomorrow.  Continue milrinone, wean NE for SBP >/= 90.   Fever curve improving.  Possible PNA based on cough/CXR (versus inflammatory with post-MI pericarditis).   - Continue vancomycin/cefepime for now, CCM following.   Pericarditis seems improved, less chest pain.  Continue colchicine and bid ASA for now.   Discussed with Dr. Roxan Hockey, with possible PNA will hold off on CABG until hopefully early next week.  I think he will need Impella 5.5 support for the  operation.   Continue heparin gtt, no thrombus on last echo. Would echo again prior to CABG/Impella 5.5.  Think some of the agitation yesterday may have been benzo/narcotic withdrawal.  I think he had been on both at home prior to back surgery.  Precedex gtt ongoing, has scheduled benzos/oxycodone.   CRITICAL CARE Performed by: Loralie Champagne  Total critical care time: 35 minutes  Critical care time was exclusive of separately billable procedures and treating other patients.  Critical care was necessary to treat or prevent imminent or life-threatening deterioration.  Critical care was time spent personally by me on the following activities: development of treatment plan with patient and/or surrogate as well as nursing, discussions with consultants, evaluation of patient's response to treatment, examination of patient, obtaining history from patient or surrogate, ordering and performing treatments and interventions, ordering and review of laboratory studies, ordering and review of radiographic studies, pulse oximetry and re-evaluation of patient's condition.   Loralie Champagne 07/01/2020 12:26 PM

## 2020-07-02 DIAGNOSIS — I2111 ST elevation (STEMI) myocardial infarction involving right coronary artery: Secondary | ICD-10-CM | POA: Diagnosis not present

## 2020-07-02 DIAGNOSIS — J9601 Acute respiratory failure with hypoxia: Secondary | ICD-10-CM

## 2020-07-02 DIAGNOSIS — F13239 Sedative, hypnotic or anxiolytic dependence with withdrawal, unspecified: Secondary | ICD-10-CM

## 2020-07-02 LAB — BASIC METABOLIC PANEL
Anion gap: 10 (ref 5–15)
BUN: 16 mg/dL (ref 6–20)
CO2: 27 mmol/L (ref 22–32)
Calcium: 8.3 mg/dL — ABNORMAL LOW (ref 8.9–10.3)
Chloride: 99 mmol/L (ref 98–111)
Creatinine, Ser: 0.9 mg/dL (ref 0.61–1.24)
GFR calc non Af Amer: >60 mL/min (ref >60)
Glucose, Bld: 117 mg/dL — ABNORMAL HIGH (ref 70–99)
Potassium: 3.9 mmol/L (ref 3.5–5.1)
Sodium: 136 mmol/L (ref 135–145)

## 2020-07-02 LAB — CULTURE, BLOOD (ROUTINE X 2)
Culture: NO GROWTH
Culture: NO GROWTH
Special Requests: ADEQUATE
Special Requests: ADEQUATE

## 2020-07-02 LAB — GLUCOSE, CAPILLARY
Glucose-Capillary: 111 mg/dL — ABNORMAL HIGH (ref 70–99)
Glucose-Capillary: 112 mg/dL — ABNORMAL HIGH (ref 70–99)
Glucose-Capillary: 113 mg/dL — ABNORMAL HIGH (ref 70–99)
Glucose-Capillary: 136 mg/dL — ABNORMAL HIGH (ref 70–99)
Glucose-Capillary: 148 mg/dL — ABNORMAL HIGH (ref 70–99)
Glucose-Capillary: 150 mg/dL — ABNORMAL HIGH (ref 70–99)

## 2020-07-02 LAB — CBC
HCT: 33.5 % — ABNORMAL LOW (ref 39.0–52.0)
Hemoglobin: 11.1 g/dL — ABNORMAL LOW (ref 13.0–17.0)
MCH: 29.8 pg (ref 26.0–34.0)
MCHC: 33.1 g/dL (ref 30.0–36.0)
MCV: 89.8 fL (ref 80.0–100.0)
Platelets: 194 10*3/uL (ref 150–400)
RBC: 3.73 MIL/uL — ABNORMAL LOW (ref 4.22–5.81)
RDW: 12.7 % (ref 11.5–15.5)
WBC: 11.8 10*3/uL — ABNORMAL HIGH (ref 4.0–10.5)
nRBC: 0 % (ref 0.0–0.2)

## 2020-07-02 LAB — COOXEMETRY PANEL
Carboxyhemoglobin: 1.3 % (ref 0.5–1.5)
Methemoglobin: 0.7 % (ref 0.0–1.5)
O2 Saturation: 61.5 %
Total hemoglobin: 11.4 g/dL — ABNORMAL LOW (ref 12.0–16.0)

## 2020-07-02 LAB — HEPARIN LEVEL (UNFRACTIONATED): Heparin Unfractionated: 0.53 IU/mL (ref 0.30–0.70)

## 2020-07-02 MED ORDER — OXYCODONE-ACETAMINOPHEN 5-325 MG PO TABS
1.0000 | ORAL_TABLET | Freq: Four times a day (QID) | ORAL | Status: DC | PRN
  Administered 2020-07-02: 1 via ORAL

## 2020-07-02 MED ORDER — OXYCODONE-ACETAMINOPHEN 5-325 MG PO TABS
1.0000 | ORAL_TABLET | Freq: Four times a day (QID) | ORAL | Status: DC
  Administered 2020-07-02 – 2020-07-04 (×7): 1 via ORAL
  Filled 2020-07-02 (×12): qty 1

## 2020-07-02 MED ORDER — OXYCODONE-ACETAMINOPHEN 5-325 MG PO TABS
1.0000 | ORAL_TABLET | Freq: Four times a day (QID) | ORAL | Status: DC
  Filled 2020-07-02: qty 1

## 2020-07-02 MED ORDER — MELATONIN 3 MG PO TABS
3.0000 mg | ORAL_TABLET | Freq: Every day | ORAL | Status: DC
  Administered 2020-07-02 – 2020-07-06 (×5): 3 mg via ORAL
  Filled 2020-07-02 (×5): qty 1

## 2020-07-02 NOTE — Progress Notes (Signed)
Pt seen on rounds this morning, wound stable, no concerning features, no radicular / leg pain, minimal back pain, no evidence of swelling/hematoma, doing well. Available for any concerns/questions, okay to continue anticoagulants/antiplatelet agents as indicated.  Autumn Patty, MD Neurosurgery

## 2020-07-02 NOTE — Progress Notes (Addendum)
Patient ID: Korvin Valentine, male   DOB: 20-May-1971, 49 y.o.   MRN: [id number]     Advanced Heart Failure Rounding Note  PCP-Cardiologist: Tonny Bollman, MD    Patient Profile   49 y/o male s/p recent back surgery, admitted for acute inferior and anterolateral MI. LHC showed thrombotic occlusion of distal RCA that was treated with PTCA and thrombectomy.  He was also noted to have chronic total occlusion of the LAD with collaterals from the right (thus this territory was affected by the RCA MI) as well as 90% complex proximal LCx stenosis. EF 25-30% + apical thrombus. RV normal. Course b/c cardiogenic shock and acute hypoxic respiratory failure. ? Component of septic shock.    Subjective:    Extubated 10/6.   Remains on Vanc + cefepime for PNA. Improving. AF. WBC trending down, 16>12K  Tracheal aspirate growing normal respiratory flora. No staph aureus or Pseudomonas. Blood cultures NGTD.     Now off NE. Remains on Milrinone 0.375. Co-ox 62%. CVP 5. BP soft, 80s-90s systolic. Asymptomatic. PO intake not great.   Main compliant is poor sleep. Asking for sleep aid.   Continues w/ cough, and mild pleuritic CP. No dyspnea.    Objective:   Weight Range: 75.5 kg Body mass index is 25.31 kg/m.   Vital Signs:   Temp:  [97.7 F (36.5 C)-100.8 F (38.2 C)] 97.7 F (36.5 C) (10/08 0736) Pulse Rate:  [65-117] 87 (10/08 0700) Resp:  [10-30] 15 (10/08 0000) BP: (68-124)/(46-102) 84/55 (10/08 0700) SpO2:  [85 %-100 %] 100 % (10/08 0700) Weight:  [75.5 kg] 75.5 kg (10/08 0600) Last BM Date: 07/01/20  Weight change: Filed Weights   06/29/20 0316 07/01/20 0500 07/02/20 0600  Weight: 76.7 kg 74.9 kg 75.5 kg    Intake/Output:   Intake/Output Summary (Last 24 hours) at 07/02/2020 0804 Last data filed at 07/02/2020 0700 Gross per 24 hour  Intake 2344.36 ml  Output 2076 ml  Net 268.36 ml      Physical Exam    CVP 4-5 General: fatigue appearing male, sitting up in chair. NAD   Neck: No JVD, no thyromegaly or thyroid nodule.  Lungs:decreased BS at the bases bilaterally  CV: Nondisplaced PMI.  Heart regular S1/S2, no S3/S4, no murmur. Abdomen: Soft, nontender, no hepatosplenomegaly, no distention.  Skin: Intact without lesions or rashes.  Neurologic: Alert and oriented x 3.  Psych: Normal affect. Extremities: No clubbing or cyanosis. Warm. No edema, +LUE PICC  HEENT: Normal.    Telemetry   NSR 80s-90s (personally reviewed).    EKG    No new EKG to review   Labs    CBC Recent Labs    07/01/20 0352 07/02/20 0539  WBC 15.6* 11.8*  HGB 12.3* 11.1*  HCT 38.0* 33.5*  MCV 90.0 89.8  PLT 215 194   Basic Metabolic Panel Recent Labs    49/70/26 1649 06/30/20 0404 06/30/20 0404 07/01/20 0352 07/02/20 0539  NA  --  134*   < > 135 136  K  --  4.1   < > 3.5 3.9  CL  --  98   < > 95* 99  CO2  --  27   < > 27 27  GLUCOSE  --  143*   < > 133* 117*  BUN  --  17   < > 15 16  CREATININE  --  0.75   < > 0.94 0.90  CALCIUM  --  8.1*   < > 8.2* 8.3*  MG 2.0 2.2  --   --   --   PHOS 1.7* 3.3  --   --   --    < > = values in this interval not displayed.   Liver Function Tests No results for input(s): AST, ALT, ALKPHOS, BILITOT, PROT, ALBUMIN in the last 72 hours. No results for input(s): LIPASE, AMYLASE in the last 72 hours. Cardiac Enzymes No results for input(s): CKTOTAL, CKMB, CKMBINDEX, TROPONINI in the last 72 hours.  BNP: BNP (last 3 results) Recent Labs    06/25/20 2334 06/26/20 0150  BNP 349.1* 368.4*    ProBNP (last 3 results) No results for input(s): PROBNP in the last 8760 hours.   D-Dimer No results for input(s): DDIMER in the last 72 hours. Hemoglobin A1C No results for input(s): HGBA1C in the last 72 hours. Fasting Lipid Panel No results for input(s): CHOL, HDL, LDLCALC, TRIG, CHOLHDL, LDLDIRECT in the last 72 hours. Thyroid Function Tests No results for input(s): TSH, T4TOTAL, T3FREE, THYROIDAB in the last 72  hours.  Invalid input(s): FREET3  Other results:   Imaging    [place] EKG SITE RITE  Result Date: 07/01/2020 If Site Rite image not attached, placement could not be confirmed due to current cardiac rhythm.    Medications:     Scheduled Medications: . aspirin  325 mg Oral BID  . atorvastatin  80 mg Oral Daily  . Chlorhexidine Gluconate Cloth  6 each Topical Daily  . colchicine  0.6 mg Oral BID  . digoxin  0.125 mg Oral Daily  . docusate  100 mg Oral BID  . feeding supplement (ENSURE ENLIVE)  237 mL Oral TID BM  . insulin aspart  1-3 Units Subcutaneous Q4H  . ivabradine  5 mg Oral BID WC  . multivitamin with minerals  1 tablet Oral Daily  . pantoprazole  40 mg Oral Daily  . polyethylene glycol  17 g Oral Daily  . sodium chloride flush  10-40 mL Intracatheter Q12H  . sodium chloride flush  3 mL Intravenous Q12H  . traZODone  100 mg Oral QHS    Infusions: . sodium chloride Stopped (07/02/20 0647)  . sodium chloride 10 mL/hr at 06/26/20 0700  . ceFEPime (MAXIPIME) IV Stopped (07/02/20 0617)  . dexmedetomidine (PRECEDEX) IV infusion 0.8 mcg/kg/hr (07/02/20 0700)  . heparin 2,100 Units/hr (07/02/20 0700)  . milrinone 0.375 mcg/kg/min (07/02/20 0700)  . norepinephrine (LEVOPHED) Adult infusion Stopped (07/01/20 1806)  . vancomycin 200 mL/hr at 07/02/20 0700    PRN Medications: sodium chloride, acetaminophen, ALPRAZolam, fentaNYL (SUBLIMAZE) injection, guaiFENesin-dextromethorphan, nitroGLYCERIN, ondansetron (ZOFRAN) IV, phenol, sodium chloride flush, sodium chloride flush    Assessment/Plan   1. CAD: Late presentation inferior MI.  Cath showed thrombotic occlusion distal RCA, CTO LAD with collaterals from right, and complex 90% proximal LCx stenosis.  He had PTCA/thrombectomy RCA with good flow at end of procedure.  Suspect he had damage to LAD territory as well as RCA territory as RCA collateralized the LAD.   - ASA/statin.  - Plan is for CABG, tentatively scheduled for  10/13.  Will be high risk in setting of depressed EF and marginal cardiac output.  Continue milrinone, 0.375. Placement of Impella to support cardiac surgery now seems feasible with no LV thrombus on last echo.   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 Suspect post-MI pericarditis. ESR elevated at 20.  Much better now, less chest pain.  -  Continue ASA 325 mg bid for pericarditis  - Colchicine 0.6 bid.  - No steroids post-MI, no traditional NSAIDs.  3. Acute systolic CHF>>Cardiogenic Shock: Echo with EF 25-30%, RV ok (no evidence for RV infarct), LV thrombus.  Repeat bedside echo 10/3  EF 25%, normal RV w/ no pericardial effusion, no LV thrombus seen on this echo. Remains on milrinone 0.375. off NE. Co-ox 62%. CVP down to 4. BP soft.  - Hold diuretics today  - Continue milrinone 0.375 - Continue digoxin 0.125 daily.  - Ivabradine controlling HR well at this point.  - To get him through eventual CABG, think he would need mechanical support.  Impella now appears to be an option with no LV thrombus on last echo.   4. LV thrombus: Resolved on 10/3 echo. On IV heparin gtt.  5. ID: Now afebrile with WBCs trending down 26>>31>>27>>15K>>12.6>>15.6>>11.8.  PCT mildly elevated 0.96. This may be effect of acute MI with post-infarct pericarditis but treating empirically w/ broad spectrum abx, on vanc + cefepime. Possible RLL PNA on CXR.  Blood cultures NGTD, pending sputum cultures unremarkable. Normal resp flora - Continue empiric vancomycin/cefepime. D/w PCCM, treat x 7 days. End date 10/10. 6. S/p back surgery: Micro-discectomy last week.  Neurosurgery following, appreciated.  7. Acute Hypoxic Respiratory Failure: Pulmonary edema +/- PNA. CVP down to 4.  - extubated 10/6, stable on RA. Appreciate PCCM assistance  8. 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 Continues w/ uncontrolled pain, resume  scheduled home oxycodone regimen q6h 9. Insomnia - add melatonin   Lyda Jester, PA-C  07/02/2020 8:04 AM   Patient seen and examined with the above-signed Advanced Practice Provider and/or Housestaff. I personally reviewed laboratory data, imaging studies and relevant notes. I independently examined the patient and formulated the important aspects of the plan. I have edited the note to reflect any of my changes or salient points. I have personally discussed the plan with the patient and/or family.  Extubated yesterday. On milrinone 0.375. Co-ox ok. Volume status improved. Chest pain under better control. PNA improving.   General:  Sitting up in bed. No resp difficulty HEENT: normal Neck: supple. no JVD. Carotids 2+ bilat; no bruits. No lymphadenopathy or thryomegaly appreciated. Cor: PMI nondisplaced. Regular rate & rhythm. No rubs, gallops or murmurs. Lungs: + crackles at basese Abdomen: soft, nontender, nondistended. No hepatosplenomegaly. No bruits or masses. Good bowel sounds. Extremities: no cyanosis, clubbing, rash, edema Neuro: alert & orientedx3, cranial nerves grossly intact. moves all 4 extremities w/o difficulty. Affect pleasant  Stable after extubation. Co-ox ok on milrinone. Infection clearing. Continue milrinone and broad spectrum abx. Ambulate. Possibly to SDU tomorrow. CABG next week. BP too soft for GDMT.   Glori Bickers, MD  4:51 PM

## 2020-07-02 NOTE — Progress Notes (Signed)
5 Days Post-Op Procedure(s): INVASIVE LAB ABORTED CASE Subjective: Up in chair, feels better  Objective: Vital signs in last 24 hours: Temp:  [97.7 F (36.5 C)-100.8 F (38.2 C)] 97.7 F (36.5 C) (10/08 1119) Pulse Rate:  [79-117] 86 (10/08 1200) Cardiac Rhythm: Normal sinus rhythm (10/07 2000) Resp:  [13-27] 21 (10/08 1200) BP: (81-124)/(46-85) 92/63 (10/08 1200) SpO2:  [85 %-100 %] 100 % (10/08 1200) Weight:  [75.5 kg] 75.5 kg (10/08 0600)  Hemodynamic parameters for last 24 hours: PAP: (17-51)/(8-29) 29/15 CVP:  [1 mmHg-3 mmHg] 3 mmHg  Intake/Output from previous day: 10/07 0701 - 10/08 0700 In: 2564.5 [P.O.:600; I.V.:1124.6; IV Piggyback:839.8] Out: 2426 [Urine:2425; Stool:1] Intake/Output this shift: Total I/O In: 1142.2 [P.O.:720; I.V.:263.5; IV Piggyback:158.8] Out: 1125 [Urine:1125]  General appearance: alert, cooperative and no distress Neurologic: intact Heart: regular rate and rhythm Lungs: diminished breath sounds bibasilar  Lab Results: Recent Labs    07/01/20 0352 07/02/20 0539  WBC 15.6* 11.8*  HGB 12.3* 11.1*  HCT 38.0* 33.5*  PLT 215 194   BMET:  Recent Labs    07/01/20 0352 07/02/20 0539  NA 135 136  K 3.5 3.9  CL 95* 99  CO2 27 27  GLUCOSE 133* 117*  BUN 15 16  CREATININE 0.94 0.90  CALCIUM 8.2* 8.3*    PT/INR: No results for input(s): LABPROT, INR in the last 72 hours. ABG    Component Value Date/Time   PHART 7.396 06/29/2020 0458   HCO3 32.3 (H) 06/29/2020 0458   TCO2 34 (H) 06/29/2020 0458   ACIDBASEDEF 1.0 06/26/2020 0029   O2SAT 61.5 07/02/2020 0538   CBG (last 3)  Recent Labs    07/02/20 0038 07/02/20 0627 07/02/20 1117  GLUCAP 112* 111* 150*    Assessment/Plan: S/P Procedure(s): INVASIVE LAB ABORTED CASE - Continues to slowly improve  T max last night 100.8- trending down WBC down to 11.8 Plan is to complete 7 day course of antibiotics, day 6 now Repeat CXR on Monday Plan CABG 10/13   LOS: 7 days     Loreli Slot 07/02/2020

## 2020-07-02 NOTE — Progress Notes (Signed)
NAME:  Domenick Quebedeaux, MRN:  [id number], DOB:  1970/12/19, LOS: 7 ADMISSION DATE:  06/25/2020, CONSULTATION DATE:  10/3 REFERRING MD:  Shirlee Latch, CHIEF COMPLAINT:  hypoxia   Brief History   STEMI with LV thrombus, pericarditis  History of present illness   Mr. Cassatt is a 49 y/o gentleman who presented on 10/1 with chest pain found to have an acute inferior STEMI. He was found to have acute distal RCA occlusion treated with aspiration thrombectomy and balloon angioplasty. He has a CTO LAD with collaterals, severe complex stenossi of the proximal LCx, and severe global and segmental LV dysfunction with elevated LVEDP. His course has been complicated by respiratory failure due to pulmonary edema, pericarditis, and LV thrombus. On 10/3, he had progressive tachycardia, hypotension, and ongoing severe anterior chest pain that is only improved with leaning forward. He has had colchicine, aspirin, and morphine without significant relief in his chest pain. Never smoker, no previous PMH.  Past Medical History    Significant Hospital Events   LHC with aspiration thrombectomy and balloon angioplasty on 10/1  Consults:  PCCM Cardiology  Procedures:  LHC 10/1  Significant Diagnostic Tests:  Echo: LVEF 25-30%, LV thrombus Repeat echo 10/3: LV thrombus not seen, 25% LVEF   Micro Data:  Blood cx 10/3> NGx3D MRSa negative covid negative  Antimicrobials:  Cefepime 10/3> 10/10 Bactrim 10/3>10/3 vancomycin 10/3> 10/10  Interim history/subjective:  Increased pain overnight, didn't sleep well.  Objective   Blood pressure 94/65, pulse 86, temperature 97.8 F (36.6 C), resp. rate (!) 22, height 5\' 8"  (1.727 m), weight 75.5 kg, SpO2 93 %. PAP: (29-50)/(15-24) 29/15      Intake/Output Summary (Last 24 hours) at 07/02/2020 1827 Last data filed at 07/02/2020 1800 Gross per 24 hour  Intake 2854.39 ml  Output 1951 ml  Net 903.39 ml   Filed Weights   06/29/20 0316 07/01/20 0500 07/02/20 0600   Weight: 76.7 kg 74.9 kg 75.5 kg    Examination: General: Sitting up in bedside chair in no acute distress HEENT: [place]/AT, eyes anicteric, oral mucosa moist Neuro: Awake and alert, interactive, moving all extremities spontaneously.  Normal gait CV: Regular rate and rhythm PULM: Breathing comfortably on room air, clear to auscultation bilaterally. GI: Soft, nontender, nondistended Extremities: No peripheral edema, no clubbing or cyanosis Skin: no rashes or wounds.  No erythema around lines.  Resolved Hospital Problem list    Assessment & Plan:  Cardiogenic shock due to acute decompensated heart failure from ICM Acute inferior STEMI CTO LAD, CAD of LCx as well Pericarditis -Continue inotropic support per cardiology's recommendations. -Continue IV bradycardia heart rate control. -Pain control to ensure heart rate control. -Planning for CABG next week -Heparin infusion -Continue colchicine.  Unable to use steroids due to risk for mechanical LV complications. -Daily aspirin and statin -Enteral diuretics  Acute hypoxic respiratory failure due to cardiogenic pulmonary edema RLL pneumonia -Doing well on room air.  Maintain saturations greater than 90%. -Continue to encourage pulmonary hygiene.  Out of bed mobility with assistance and incentive spirometry. -Head of bed greater than 30 degrees -Complete 7 days of broad-spectrum antibiotics.  Will complete on 10/10.   Agitation/restlessness, concern for effective medication withdrawal. On Xanax qhs at home -Xanax 0.5 mg 3 times daily as needed -Scheduled oxycodone for pain control -Precedex as needed   Hyperglycemia-controlled -Sliding scale insulin as needed -Goal BG 140-180 while admitted to the ICU.   Acute anemia, likely due to acute illness -Transfuse for hemoglobin less than 8  or hemodynamically significant bleeding -Continue to monitor  Best practice:  Diet: NPO at midnight  Pain/Anxiety/Delirium protocol (if  indicated): yes VAP protocol (if indicated): yes DVT prophylaxis: heparin GI prophylaxis: pantoprazole Glucose control: per primary Mobility: bedrest Code Status: full Family Communication: Family updated at bedside  Disposition: ICU  Labs   CBC: Recent Labs  Lab 06/28/20 0500 06/28/20 0521 06/29/20 0436 06/29/20 0458 06/30/20 0404 07/01/20 0352 07/02/20 0539  WBC 27.3*  --  14.6*  --  12.6* 15.6* 11.8*  HGB 12.9*   < > 11.5* 16.3 11.6* 12.3* 11.1*  HCT 39.5   < > 36.2* 48.0 35.6* 38.0* 33.5*  MCV 90.0  --  92.1  --  91.3 90.0 89.8  PLT 231  --  173  --  173 215 194   < > = values in this interval not displayed.    Basic Metabolic Panel: Recent Labs  Lab 06/28/20 0500 06/28/20 0521 06/28/20 1614 06/29/20 0436 06/29/20 0458 06/29/20 1649 06/30/20 0404 07/01/20 0352 07/02/20 0539  NA 135   < >  --  136 137  --  134* 135 136  K 4.0   < >  --  4.4 4.2  --  4.1 3.5 3.9  CL 101  --   --  101  --   --  98 95* 99  CO2 24  --   --  26  --   --  27 27 27   GLUCOSE 151*  --   --  159*  --   --  143* 133* 117*  BUN 20  --   --  20  --   --  17 15 16   CREATININE 1.08  --   --  0.77  --   --  0.75 0.94 0.90  CALCIUM 8.0*  --   --  7.9*  --   --  8.1* 8.2* 8.3*  MG 1.9  --  2.5* 2.3  --  2.0 2.2  --   --   PHOS  --   --  2.2* 2.4*  --  1.7* 3.3  --   --    < > = values in this interval not displayed.   GFR: Estimated Creatinine Clearance: 96.1 mL/min (by C-G formula based on SCr of 0.9 mg/dL). Recent Labs  Lab 06/27/20 1106 06/27/20 1134 06/27/20 1544 06/27/20 1758 06/27/20 2011 06/27/20 2121 06/28/20 0500 06/29/20 0436 06/29/20 1555 06/30/20 0404 07/01/20 0352 07/02/20 0539  PROCALCITON 1.21  --   --   --   --   --   --   --  0.94  --   --   --   WBC  --   --   --   --    < >  --    < > 14.6*  --  12.6* 15.6* 11.8*  LATICACIDVEN  --  1.2 1.5 1.2  --  1.2  --   --   --   --   --   --    < > = values in this interval not displayed.    Liver Function  Tests: No results for input(s): AST, ALT, ALKPHOS, BILITOT, PROT, ALBUMIN in the last 168 hours. No results for input(s): LIPASE, AMYLASE in the last 168 hours. No results for input(s): AMMONIA in the last 168 hours.  ABG    Component Value Date/Time   PHART 7.396 06/29/2020 0458   PCO2ART 53.2 (H) 06/29/2020 0458   PO2ART 100 06/29/2020 0458  HCO3 32.3 (H) 06/29/2020 0458   TCO2 34 (H) 06/29/2020 0458   ACIDBASEDEF 1.0 06/26/2020 0029   O2SAT 61.5 07/02/2020 0538     Coagulation Profile: Recent Labs  Lab 06/25/20 2334  INR 1.2    Cardiac Enzymes: No results for input(s): CKTOTAL, CKMB, CKMBINDEX, TROPONINI in the last 168 hours.  HbA1C: Hgb A1c MFr Bld  Date/Time Value Ref Range Status  06/25/2020 11:34 PM 5.6 4.8 - 5.6 % Final    Comment:    (NOTE) Pre diabetes:          5.7%-6.4%  Diabetes:              >6.4%  Glycemic control for   <7.0% adults with diabetes     CBG: Recent Labs  Lab 07/01/20 1934 07/02/20 0038 07/02/20 0627 07/02/20 1117 07/02/20 1510  GLUCAP 93 112* 111* 150* 148*      This patient is critically ill with multiple organ system failure which requires frequent high complexity decision making, assessment, support, evaluation, and titration of therapies. This was completed through the application of advanced monitoring technologies and extensive interpretation of multiple databases. During this encounter critical care time was devoted to patient care services described in this note for 35 minutes.  Steffanie Dunn, DO 07/02/20 6:34 PM [facility]

## 2020-07-02 NOTE — Progress Notes (Signed)
[place] for heparin Indication: LV thrombus  No Known Allergies  Patient Measurements: Height: 5\' 8"  (172.7 cm) Weight: 75.5 kg (166 lb 7.2 oz) IBW/kg (Calculated) : 68.4  Vital Signs: Temp: 97.7 F (36.5 C) (10/08 0736) Temp Source: Oral (10/08 0040) BP: 84/55 (10/08 0700) Pulse Rate: 87 (10/08 0700)  Labs: Recent Labs    06/30/20 0404 06/30/20 0404 07/01/20 0352 07/02/20 0539  HGB 11.6*   < > 12.3* 11.1*  HCT 35.6*  --  38.0* 33.5*  PLT 173  --  215 194  HEPARINUNFRC 0.32  --  0.21* 0.53  CREATININE 0.75  --  0.94 0.90   < > = values in this interval not displayed.    Estimated Creatinine Clearance: 96.1 mL/min (by C-G formula based on SCr of 0.9 mg/dL).   Medical History: Past Medical History:  Diagnosis Date  . Anxiety   . Coronary artery disease   . Myocardial infarction [facility])     Medications:  Medications Prior to Admission  Medication Sig Dispense Refill Last Dose  . ALPRAZolam (XANAX) 0.5 MG tablet Take 0.5 mg by mouth at bedtime as needed for sleep.    06/24/2020  . Multiple Vitamins-Minerals (CENTRUM ULTRA MENS) TABS Take 1 tablet by mouth.   06/24/2020  . NARCAN 4 MG/0.1ML LIQD nasal spray kit Place 1 spray into the nose once.    unknown  . traZODone (DESYREL) 100 MG tablet Take 100 mg by mouth at bedtime.   06/24/2020    Assessment: 49 y.o. M presented as STEMI - s/p angioplasty. Cardiac surgery consulted for consideration of multivessel CABG - once more stable. Heparin continued for LV thrombus on ECHO 10/2 (only smoke seen on repeat 10/4). Of note, pt had recent back surgery so utilizing lower heparin level goal.  Heparin level slightly above goal at 0.53, CBC stable.  Goals: Heparin level 0.3-0.5 units/ml   Plan:  Reduce heparin to 2050 units/h Daily heparin level and CBC   Arrie Senate, PharmD, BCPS Clinical Pharmacist [phone] Please check AMION for all [place] numbers 07/02/2020

## 2020-07-02 NOTE — Progress Notes (Signed)
CARDIAC REHAB PHASE I   Pt disappointed he didn't go for surgery today, but states understanding of why they made that decision. Pt in better spirits. Encouraged continued IS use, sitting in chair, and walks as appropriate. Pt denies further questions or concerns at this time. Will continue to follow to provide support and encouragement.   [id number] Reynold Bowen, RN BSN 07/02/2020 10:58 AM

## 2020-07-03 DIAGNOSIS — I2111 ST elevation (STEMI) myocardial infarction involving right coronary artery: Secondary | ICD-10-CM | POA: Diagnosis not present

## 2020-07-03 DIAGNOSIS — R570 Cardiogenic shock: Secondary | ICD-10-CM | POA: Diagnosis not present

## 2020-07-03 LAB — CBC
HCT: 32.6 % — ABNORMAL LOW (ref 39.0–52.0)
Hemoglobin: 10.5 g/dL — ABNORMAL LOW (ref 13.0–17.0)
MCH: 29.2 pg (ref 26.0–34.0)
MCHC: 32.2 g/dL (ref 30.0–36.0)
MCV: 90.8 fL (ref 80.0–100.0)
Platelets: 199 10*3/uL (ref 150–400)
RBC: 3.59 MIL/uL — ABNORMAL LOW (ref 4.22–5.81)
RDW: 12.7 % (ref 11.5–15.5)
WBC: 11.6 10*3/uL — ABNORMAL HIGH (ref 4.0–10.5)
nRBC: 0 % (ref 0.0–0.2)

## 2020-07-03 LAB — DIGOXIN LEVEL: Digoxin Level: 0.6 ng/mL — ABNORMAL LOW (ref 1.0–2.0)

## 2020-07-03 LAB — BASIC METABOLIC PANEL
Anion gap: 10 (ref 5–15)
BUN: 16 mg/dL (ref 6–20)
CO2: 23 mmol/L (ref 22–32)
Calcium: 7.9 mg/dL — ABNORMAL LOW (ref 8.9–10.3)
Chloride: 104 mmol/L (ref 98–111)
Creatinine, Ser: 0.89 mg/dL (ref 0.61–1.24)
GFR, Estimated: >60 mL/min (ref >60)
Glucose, Bld: 128 mg/dL — ABNORMAL HIGH (ref 70–99)
Potassium: 3.5 mmol/L (ref 3.5–5.1)
Sodium: 137 mmol/L (ref 135–145)

## 2020-07-03 LAB — COOXEMETRY PANEL
Carboxyhemoglobin: 1.1 % (ref 0.5–1.5)
Methemoglobin: 0.8 % (ref 0.0–1.5)
O2 Saturation: 61.7 %
Total hemoglobin: 11.2 g/dL — ABNORMAL LOW (ref 12.0–16.0)

## 2020-07-03 LAB — GLUCOSE, CAPILLARY
Glucose-Capillary: 114 mg/dL — ABNORMAL HIGH (ref 70–99)
Glucose-Capillary: 115 mg/dL — ABNORMAL HIGH (ref 70–99)
Glucose-Capillary: 116 mg/dL — ABNORMAL HIGH (ref 70–99)
Glucose-Capillary: 124 mg/dL — ABNORMAL HIGH (ref 70–99)
Glucose-Capillary: 135 mg/dL — ABNORMAL HIGH (ref 70–99)

## 2020-07-03 LAB — MAGNESIUM: Magnesium: 2.2 mg/dL (ref 1.7–2.4)

## 2020-07-03 LAB — HEPARIN LEVEL (UNFRACTIONATED): Heparin Unfractionated: 0.63 IU/mL (ref 0.30–0.70)

## 2020-07-03 MED ORDER — POTASSIUM CHLORIDE CRYS ER 20 MEQ PO TBCR
40.0000 meq | EXTENDED_RELEASE_TABLET | Freq: Once | ORAL | Status: AC
  Administered 2020-07-03: 40 meq via ORAL
  Filled 2020-07-03: qty 2

## 2020-07-03 MED ORDER — POTASSIUM CHLORIDE CRYS ER 20 MEQ PO TBCR
20.0000 meq | EXTENDED_RELEASE_TABLET | Freq: Once | ORAL | Status: AC
  Administered 2020-07-03: 20 meq via ORAL
  Filled 2020-07-03: qty 1

## 2020-07-03 MED ORDER — FUROSEMIDE 40 MG PO TABS
40.0000 mg | ORAL_TABLET | Freq: Every day | ORAL | Status: DC
  Administered 2020-07-03 – 2020-07-05 (×3): 40 mg via ORAL
  Filled 2020-07-03 (×3): qty 1

## 2020-07-03 NOTE — Progress Notes (Signed)
Per MD order patients Precedex decreased to 0.6 (see flowsheet for exact time), within a half an hour patient had increased anxiety, agitation and restlessness. Pt unassisted and unwitnessed moved from bed to chair. Heart rate elevated, and patient complained of shortness of breath. No falls noted or reported, patient verbalized that "he was very safe and steady on his feet". Discussed with the patient the importance of calling for help to transfer and to ambulate due to multiple drips including heparin. Discussed with the patient the blood thinning action of heparin and the higher likelihood of injury from falls. Patient verbalized understanding. Pt positioned in chair, with call bell in reach. At this time due to increased anxiety and agitation and continued precedex infusion patient is unable to transfer from [facility] to 2C at this time. Patients PRN xanax 0.5 given, along with scheduled PM medications, will continue to attempt to wean off precedex as able.

## 2020-07-03 NOTE — Progress Notes (Signed)
Pharmacy Antibiotic Note  Jerry [name] is a 49 y.o. male admitted on 06/25/2020 with chest pain/code stemi. Patient well known to pharmacy service for anticoagulation dosing. Recent microdiscectomy last week, incision stable per neurosurgery follow up, fevers now improved, with wbc improved 25>11.6. On broad spectrum ABX for wound and RLL pna vancomycin and cefepime.   Plan: Vancomycin and Cefepime stop today.  Height: 5\' 8"  (172.7 cm) Weight: 76.3 kg (168 lb 3.4 oz) IBW/kg (Calculated) : 68.4  Temp (24hrs), Avg:97.7 F (36.5 C), Min:97.5 F (36.4 C), Max:98 F (36.7 C)  Recent Labs  Lab 06/27/20 1134 06/27/20 1544 06/27/20 1758 06/27/20 2011 06/27/20 2121 06/28/20 0500 06/29/20 0436 06/30/20 0404 06/30/20 1337 07/01/20 0352 07/02/20 0539 07/03/20 0501  WBC  --   --   --    < >  --    < > 14.6* 12.6*  --  15.6* 11.8* 11.6*  CREATININE  --   --   --    < >  --    < > 0.77 0.75  --  0.94 0.90 0.89  LATICACIDVEN 1.2 1.5 1.2  --  1.2  --   --   --   --   --   --   --   VANCOTROUGH  --   --   --   --   --   --   --   --  <4*  --   --   --    < > = values in this interval not displayed.    Estimated Creatinine Clearance: 97.1 mL/min (by C-G formula based on SCr of 0.89 mg/dL).    No Known Allergies  09/02/20, Reece Leader, BCCP Clinical Pharmacist  07/03/2020 3:00 PM   [facility] pharmacy phone numbers are listed on [email or web address]

## 2020-07-03 NOTE — Progress Notes (Signed)
[place] for heparin Indication: LV thrombus  No Known Allergies  Patient Measurements: Height: _0  (172.7 cm) Weight: 76.3 kg (168 lb 3.4 oz) IBW/kg (Calculated) : 68.4  Vital Signs: Temp: 97.6 F (36.4 C) (10/09 1142) Temp Source: Oral (10/09 0440) BP: 83/56 (10/09 0600) Pulse Rate: 76 (10/09 0600)  Labs: Recent Labs    07/01/20 0352 07/01/20 0352 07/02/20 0539 07/03/20 0501  HGB 12.3*   < > 11.1* 10.5*  HCT 38.0*  --  33.5* 32.6*  PLT 215  --  194 199  HEPARINUNFRC 0.21*  --  0.53 0.63  CREATININE 0.94  --  0.90 0.89   < > = values in this interval not displayed.    Estimated Creatinine Clearance: 97.1 mL/min (by C-G formula based on SCr of 0.89 mg/dL).   Medical History: Past Medical History:  Diagnosis Date  . Anxiety   . Coronary artery disease   . Myocardial infarction [facility])     Medications:  Medications Prior to Admission  Medication Sig Dispense Refill Last Dose  . ALPRAZolam (XANAX) 0.5 MG tablet Take 0.5 mg by mouth at bedtime as needed for sleep.    06/24/2020  . Multiple Vitamins-Minerals (CENTRUM ULTRA MENS) TABS Take 1 tablet by mouth.   06/24/2020  . NARCAN 4 MG/0.1ML LIQD nasal spray kit Place 1 spray into the nose once.    unknown  . traZODone (DESYREL) 100 MG tablet Take 100 mg by mouth at bedtime.   06/24/2020    Assessment: 49 y.o. M presented as STEMI - s/p angioplasty. Cardiac surgery consulted for consideration of multivessel CABG - once more stable. Heparin continued for LV thrombus on ECHO 10/2 (only smoke seen on repeat 10/4). Of note, pt had recent back surgery so utilizing lower heparin level goal.  Heparin level slightly above goal at 0.63, CBC stable.  Goals: Heparin level 0.3-0.5 units/ml   Plan:  Reduce heparin to 1950 units/h Daily heparin level and CBC   [place] Crane, Vena Austria, BCPS, [facility] Clinical Pharmacist  07/03/2020 2:59 PM   [facility] pharmacy phone numbers are listed on  [email or web address]

## 2020-07-03 NOTE — Progress Notes (Signed)
Patient ID: Lillie Bollig, male   DOB: 10-08-1970, 49 y.o.   MRN: [id number]     Advanced Heart Failure Rounding Note  PCP-Cardiologist: Sherren Mocha, MD    Patient Profile   49 y/o male s/p recent back surgery, admitted for acute inferior and anterolateral MI. LHC showed thrombotic occlusion of distal RCA that was treated with PTCA and thrombectomy.  He was also noted to have chronic total occlusion of the LAD with collaterals from the right (thus this territory was affected by the RCA MI) as well as 90% complex proximal LCx stenosis. EF 25-30% + apical thrombus. RV normal. Course b/c cardiogenic shock and acute hypoxic respiratory failure. ? Component of septic shock.    Subjective:    Extubated 10/6. Remains on Vanc + cefepime for PNA. AF. WBC trending down, 16>12K. Cx NGTD  Remains on Precedex for withdrawal. Anxious at baseline but not obviously withdrawing at this point.   Remains on Milrinone 0.375. Co-ox 62%. CVP 6. BP soft, 94W-96P systolic.  Denies CP or SOB  Objective:   Weight Range: 76.3 kg Body mass index is 25.58 kg/m.   Vital Signs:   Temp:  [97.5 F (36.4 C)-98 F (36.7 C)] 97.7 F (36.5 C) (10/09 0740) Pulse Rate:  [73-93] 76 (10/09 0600) Resp:  [16-29] 20 (10/09 0600) BP: (76-96)/(47-70) 83/56 (10/09 0600) SpO2:  [93 %-100 %] 96 % (10/09 0600) Weight:  [76.3 kg] 76.3 kg (10/09 0600) Last BM Date: 07/02/20  Weight change: Filed Weights   07/01/20 0500 07/02/20 0600 07/03/20 0600  Weight: 74.9 kg 75.5 kg 76.3 kg    Intake/Output:   Intake/Output Summary (Last 24 hours) at 07/03/2020 1038 Last data filed at 07/03/2020 0600 Gross per 24 hour  Intake 2166.76 ml  Output 2125 ml  Net 41.76 ml      Physical Exam    CVP 5-6 General: sitting in chair. Anxious but no distress HEENT: normal Neck: supple. no JVD. Carotids 2+ bilat; no bruits. No lymphadenopathy or thryomegaly appreciated. Cor: PMI nondisplaced. Regular rate & rhythm. No rubs,  gallops or murmurs. Lungs: clear Abdomen: soft, nontender, nondistended. No hepatosplenomegaly. No bruits or masses. Good bowel sounds. Extremities: no cyanosis, clubbing, rash, edema Neuro: alert & orientedx3, cranial nerves grossly intact. moves all 4 extremities w/o difficulty. Affect pleasant   Telemetry   NSR 70-80s(personally reviewed).    Labs    CBC Recent Labs    07/02/20 0539 07/03/20 0501  WBC 11.8* 11.6*  HGB 11.1* 10.5*  HCT 33.5* 32.6*  MCV 89.8 90.8  PLT 194 591   Basic Metabolic Panel Recent Labs    07/02/20 0539 07/03/20 0501  NA 136 137  K 3.9 3.5  CL 99 104  CO2 27 23  GLUCOSE 117* 128*  BUN 16 16  CREATININE 0.90 0.89  CALCIUM 8.3* 7.9*  MG  --  2.2   Liver Function Tests No results for input(s): AST, ALT, ALKPHOS, BILITOT, PROT, ALBUMIN in the last 72 hours. No results for input(s): LIPASE, AMYLASE in the last 72 hours. Cardiac Enzymes No results for input(s): CKTOTAL, CKMB, CKMBINDEX, TROPONINI in the last 72 hours.  BNP: BNP (last 3 results) Recent Labs    06/25/20 2334 06/26/20 0150  BNP 349.1* 368.4*    ProBNP (last 3 results) No results for input(s): PROBNP in the last 8760 hours.   D-Dimer No results for input(s): DDIMER in the last 72 hours. Hemoglobin A1C No results for input(s): HGBA1C in the last 72 hours. Fasting  Lipid Panel No results for input(s): CHOL, HDL, LDLCALC, TRIG, CHOLHDL, LDLDIRECT in the last 72 hours. Thyroid Function Tests No results for input(s): TSH, T4TOTAL, T3FREE, THYROIDAB in the last 72 hours.  Invalid input(s): FREET3  Other results:   Imaging    No results found.   Medications:     Scheduled Medications: . aspirin  325 mg Oral BID  . atorvastatin  80 mg Oral Daily  . Chlorhexidine Gluconate Cloth  6 each Topical Daily  . colchicine  0.6 mg Oral BID  . digoxin  0.125 mg Oral Daily  . docusate  100 mg Oral BID  . feeding supplement (ENSURE ENLIVE)  237 mL Oral TID BM  .  insulin aspart  1-3 Units Subcutaneous Q4H  . ivabradine  5 mg Oral BID WC  . melatonin  3 mg Oral QHS  . multivitamin with minerals  1 tablet Oral Daily  . oxyCODONE-acetaminophen  1 tablet Oral Q6H  . pantoprazole  40 mg Oral Daily  . polyethylene glycol  17 g Oral Daily  . sodium chloride flush  10-40 mL Intracatheter Q12H  . sodium chloride flush  3 mL Intravenous Q12H  . traZODone  100 mg Oral QHS    Infusions: . sodium chloride Stopped (07/03/20 0541)  . sodium chloride 10 mL/hr at 06/26/20 0700  . ceFEPime (MAXIPIME) IV Stopped (07/03/20 0534)  . dexmedetomidine (PRECEDEX) IV infusion 0.8 mcg/kg/hr (07/03/20 0600)  . heparin 2,050 Units/hr (07/03/20 0600)  . milrinone 0.375 mcg/kg/min (07/03/20 0600)  . norepinephrine (LEVOPHED) Adult infusion Stopped (07/01/20 1806)  . vancomycin 200 mL/hr at 07/03/20 0600    PRN Medications: sodium chloride, acetaminophen, ALPRAZolam, fentaNYL (SUBLIMAZE) injection, guaiFENesin-dextromethorphan, nitroGLYCERIN, ondansetron (ZOFRAN) IV, phenol, sodium chloride flush, sodium chloride flush    Assessment/Plan   1. CAD: Late presentation inferior MI.  Cath showed thrombotic occlusion distal RCA, CTO LAD with collaterals from right, and complex 90% proximal LCx stenosis.  He had PTCA/thrombectomy RCA with good flow at end of procedure.  Suspect he had damage to LAD territory as well as RCA territory as RCA collateralized the LAD.   - ASA/statin.  - Plan is for CABG, tentatively scheduled for 10/13.  Will be high risk in setting of depressed EF, marginal cardiac output, deconditioning. Co-ox stable. Will drop milrinone to 0.25 to see if he tolerates. Placement of Impella to support cardiac surgery now seems feasible with no LV thrombus on last echo.   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 Suspect post-MI pericarditis. ESR elevated at 20.  Much better now. CP  resolved - Continue ASA 325 mg bid for pericarditis  - Colchicine 0.6 bid.  - No steroids post-MI, no traditional NSAIDs.  3. Acute systolic CHF>>Cardiogenic Shock: Echo with EF 25-30%, RV ok (no evidence for RV infarct), LV thrombus.  Repeat bedside echo 10/3  EF 25%, normal RV w/ no pericardial effusion, no LV thrombus seen on this echo. Remains on milrinone 0.375. off NE. Co-ox 62%. CVP 6  SBP remains 80-90s - Will start lasix 40 po daily - Decreasing milrinone to 0.25 - Continue digoxin 0.125 daily.  - Ivabradine controlling HR well at this point.  - To get him through eventual CABG, think he would need mechanical support.  Impella now appears to be an option with no LV thrombus on last echo.   4. LV thrombus: Resolved on 10/3 echo. On IV heparin gtt.  5. ID: Now afebrile with WBCs trending down 26>>31>>27>>15K>>12.6>>15.6>>11.8.  PCT mildly elevated 0.96. This may be effect of acute MI with post-infarct pericarditis but treating empirically w/ broad spectrum abx, on vanc + cefepime. Possible RLL PNA on CXR.  Blood cultures NGTD,sputum cultures unremarkable. Normal resp flora - Continue empiric vancomycin/cefepime. D/w PCCM, treat x 7 days. End date 10/10. 6. S/p back surgery: Micro-discectomy last week.  Neurosurgery following, appreciated.  7. Acute Hypoxic Respiratory Failure: Pulmonary edema +/- PNA. CVP down to 4.  - extubated 10/6, stable on RA. Appreciate PCCM assistance  8. 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 Continues w/ uncontrolled pain, resume scheduled home oxycodone regimen q6h - On precedex. Will start to wean. D/w CCM  9. Insomnia - continue melatonin   Can go to [place], MD 07/03/2020 10:38 AM

## 2020-07-04 DIAGNOSIS — I309 Acute pericarditis, unspecified: Secondary | ICD-10-CM | POA: Diagnosis not present

## 2020-07-04 DIAGNOSIS — R570 Cardiogenic shock: Secondary | ICD-10-CM | POA: Diagnosis not present

## 2020-07-04 DIAGNOSIS — I2111 ST elevation (STEMI) myocardial infarction involving right coronary artery: Secondary | ICD-10-CM | POA: Diagnosis not present

## 2020-07-04 LAB — COOXEMETRY PANEL
Carboxyhemoglobin: 1.1 % (ref 0.5–1.5)
Methemoglobin: 0.9 % (ref 0.0–1.5)
O2 Saturation: 60.9 %
Total hemoglobin: 13.1 g/dL (ref 12.0–16.0)

## 2020-07-04 LAB — BASIC METABOLIC PANEL
Anion gap: 13 (ref 5–15)
BUN: 15 mg/dL (ref 6–20)
CO2: 21 mmol/L — ABNORMAL LOW (ref 22–32)
Calcium: 8.8 mg/dL — ABNORMAL LOW (ref 8.9–10.3)
Chloride: 105 mmol/L (ref 98–111)
Creatinine, Ser: 1.02 mg/dL (ref 0.61–1.24)
GFR, Estimated: >60 mL/min (ref >60)
Glucose, Bld: 137 mg/dL — ABNORMAL HIGH (ref 70–99)
Potassium: 3.8 mmol/L (ref 3.5–5.1)
Sodium: 139 mmol/L (ref 135–145)

## 2020-07-04 LAB — GLUCOSE, CAPILLARY
Glucose-Capillary: 99 mg/dL (ref 70–99)
Glucose-Capillary: 109 mg/dL — ABNORMAL HIGH (ref 70–99)
Glucose-Capillary: 111 mg/dL — ABNORMAL HIGH (ref 70–99)
Glucose-Capillary: 107 mg/dL — ABNORMAL HIGH (ref 70–99)
Glucose-Capillary: 123 mg/dL — ABNORMAL HIGH (ref 70–99)

## 2020-07-04 LAB — CBC
HCT: 38.9 % — ABNORMAL LOW (ref 39.0–52.0)
Hemoglobin: 12.4 g/dL — ABNORMAL LOW (ref 13.0–17.0)
MCH: 28.5 pg (ref 26.0–34.0)
MCHC: 31.9 g/dL (ref 30.0–36.0)
MCV: 89.4 fL (ref 80.0–100.0)
Platelets: 330 10*3/uL (ref 150–400)
RBC: 4.35 MIL/uL (ref 4.22–5.81)
RDW: 12.9 % (ref 11.5–15.5)
WBC: 20.9 10*3/uL — ABNORMAL HIGH (ref 4.0–10.5)
nRBC: 0 % (ref 0.0–0.2)

## 2020-07-04 LAB — HEPARIN LEVEL (UNFRACTIONATED): Heparin Unfractionated: 0.57 IU/mL (ref 0.30–0.70)

## 2020-07-04 LAB — MAGNESIUM: Magnesium: 2.2 mg/dL (ref 1.7–2.4)

## 2020-07-04 MED ORDER — TRAZODONE HCL 50 MG PO TABS: 100.0000 mg | ORAL_TABLET | Freq: Every evening | ORAL | Status: DC | PRN

## 2020-07-04 MED ORDER — CLONAZEPAM 0.5 MG PO TABS
0.5000 mg | ORAL_TABLET | Freq: Three times a day (TID) | ORAL | Status: DC
  Administered 2020-07-04 – 2020-07-07 (×9): 0.5 mg via ORAL
  Filled 2020-07-04 (×9): qty 1

## 2020-07-04 NOTE — Progress Notes (Signed)
Patient ID: Jerry [name], male   DOB: Feb 27, 1971, 49 y.o.   MRN: [id number]     Advanced Heart Failure Rounding Note  PCP-Cardiologist: Tonny Bollman, MD    Patient Profile   49 y/o male s/p recent back surgery, admitted for acute inferior and anterolateral MI. LHC showed thrombotic occlusion of distal RCA that was treated with PTCA and thrombectomy.  He was also noted to have chronic total occlusion of the LAD with collaterals from the right (thus this territory was affected by the RCA MI) as well as 90% complex proximal LCx stenosis. EF 25-30% + apical thrombus. RV normal. Course b/c cardiogenic shock and acute hypoxic respiratory failure. ? Component of septic shock.    Subjective:    Extubated 10/6. Remains on Vanc + cefepime for PNA. Tmax 98.4 but WBC back up from 12k -> 21k  Milrinone turned down to 0.125  Co-ox stable at 61%. CVP 5-6. SBP 99-115   Precedex turned off when transferred from [facility]. Denies CP or SOB. Very anxious. Coughing up blood-tinged sputum.    Objective:   Weight Range: 75 kg Body mass index is 25.14 kg/m.   Vital Signs:   Temp:  [97.5 F (36.4 C)-98.4 F (36.9 C)] 97.9 F (36.6 C) (10/10 0819) Pulse Rate:  [61-163] 118 (10/10 0819) Resp:  [12-32] 27 (10/10 0819) BP: (85-116)/(53-88) 116/85 (10/10 0819) SpO2:  [85 %-94 %] 94 % (10/10 0819) Weight:  [75 kg] 75 kg (10/10 0600) Last BM Date: 07/02/20  Weight change: Filed Weights   07/02/20 0600 07/03/20 0600 07/04/20 0600  Weight: 75.5 kg 76.3 kg 75 kg    Intake/Output:   Intake/Output Summary (Last 24 hours) at 07/04/2020 1021 Last data filed at 07/04/2020 0840 Gross per 24 hour  Intake 1538.42 ml  Output 1650 ml  Net -111.58 ml      Physical Exam    CVP 5-6 General: sitting in bed . Anxious pressured speech HEENT: normal Neck: supple. no JVD. Carotids 2+ bilat; no bruits. No lymphadenopathy or thryomegaly appreciated. Cor: PMI nondisplaced. Tachy regular  No rubs, gallops or  murmurs. Lungs: clear decreased in bases Abdomen: soft, nontender, nondistended. No hepatosplenomegaly. No bruits or masses. Good bowel sounds. Extremities: no cyanosis, clubbing, rash, edema Neuro: alert & orientedx3, cranial nerves grossly intact. moves all 4 extremities w/o difficulty. Affect pleasant   Telemetry   Sinus 100-120 (personally reviewed).    Labs    CBC Recent Labs    07/03/20 0501 07/04/20 0521  WBC 11.6* 20.9*  HGB 10.5* 12.4*  HCT 32.6* 38.9*  MCV 90.8 89.4  PLT 199 330   Basic Metabolic Panel Recent Labs    93/81/01 0501 07/04/20 0521  NA 137 139  K 3.5 3.8  CL 104 105  CO2 23 21*  GLUCOSE 128* 137*  BUN 16 15  CREATININE 0.89 1.02  CALCIUM 7.9* 8.8*  MG 2.2 2.2   Liver Function Tests No results for input(s): AST, ALT, ALKPHOS, BILITOT, PROT, ALBUMIN in the last 72 hours. No results for input(s): LIPASE, AMYLASE in the last 72 hours. Cardiac Enzymes No results for input(s): CKTOTAL, CKMB, CKMBINDEX, TROPONINI in the last 72 hours.  BNP: BNP (last 3 results) Recent Labs    06/25/20 2334 06/26/20 0150  BNP 349.1* 368.4*    ProBNP (last 3 results) No results for input(s): PROBNP in the last 8760 hours.   D-Dimer No results for input(s): DDIMER in the last 72 hours. Hemoglobin A1C No results for input(s): HGBA1C in  the last 72 hours. Fasting Lipid Panel No results for input(s): CHOL, HDL, LDLCALC, TRIG, CHOLHDL, LDLDIRECT in the last 72 hours. Thyroid Function Tests No results for input(s): TSH, T4TOTAL, T3FREE, THYROIDAB in the last 72 hours.  Invalid input(s): FREET3  Other results:   Imaging    No results found.   Medications:     Scheduled Medications: . aspirin  325 mg Oral BID  . atorvastatin  80 mg Oral Daily  . Chlorhexidine Gluconate Cloth  6 each Topical Daily  . colchicine  0.6 mg Oral BID  . digoxin  0.125 mg Oral Daily  . docusate  100 mg Oral BID  . feeding supplement (ENSURE ENLIVE)  237 mL Oral TID  BM  . furosemide  40 mg Oral Daily  . insulin aspart  1-3 Units Subcutaneous Q4H  . ivabradine  5 mg Oral BID WC  . melatonin  3 mg Oral QHS  . multivitamin with minerals  1 tablet Oral Daily  . oxyCODONE-acetaminophen  1 tablet Oral Q6H  . pantoprazole  40 mg Oral Daily  . polyethylene glycol  17 g Oral Daily  . sodium chloride flush  10-40 mL Intracatheter Q12H  . sodium chloride flush  3 mL Intravenous Q12H  . traZODone  100 mg Oral QHS    Infusions: . sodium chloride 10 mL/hr at 07/04/20 0700  . sodium chloride 10 mL/hr at 06/26/20 0700  . dexmedetomidine (PRECEDEX) IV infusion Stopped (07/04/20 0133)  . heparin 1,950 Units/hr (07/04/20 0700)  . milrinone 0.25 mcg/kg/min (07/04/20 0700)  . norepinephrine (LEVOPHED) Adult infusion Stopped (07/01/20 1806)    PRN Medications: sodium chloride, acetaminophen, ALPRAZolam, fentaNYL (SUBLIMAZE) injection, guaiFENesin-dextromethorphan, nitroGLYCERIN, ondansetron (ZOFRAN) IV, phenol, sodium chloride flush, sodium chloride flush    Assessment/Plan   1. CAD: Late presentation inferior MI.  Cath showed thrombotic occlusion distal RCA, CTO LAD with collaterals from right, and complex 90% proximal LCx stenosis.  He had PTCA/thrombectomy RCA with good flow at end of procedure.  Suspect he had damage to LAD territory as well as RCA territory as RCA collateralized the LAD.  No current s/s ischemia  - ASA/statin.  - Plan is for CABG, tentatively scheduled for 10/13.  Will be quite high risk in setting of depressed EF, marginal cardiac output, deconditioning. However in looking at cath films there are no other options for durable revascularization. Co-ox stable. Will continue milrinone at 0.25 for now pending surgery. Placement of Impella to support cardiac surgery now seems feasible with no LV thrombus on last echo.   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  Suspect post-MI pericarditis. ESR elevated at 20.  Much better now. CP resolved - Continue ASA 325 mg bid for pericarditis  - Colchicine 0.6 bid.  - No steroids post-MI, no traditional NSAIDs.  3. Acute systolic CHF>>Cardiogenic Shock: Echo with EF 25-30%, RV ok (no evidence for RV infarct), LV thrombus.  Repeat bedside echo 10/3  EF 25%, normal RV w/ no pericardial effusion, no LV thrombus seen on this echo. Remains on milrinone 0.25 off NE. Co-ox 61%. CVP 6  SBP improved 99-115 - Continue lasix 40 po daily - Continue milrinone at 0.25 - Continue digoxin 0.125 daily.  - Ivabradine was controlling HR well but more tachycardic in face of possible benzo WD - To get him through eventual CABG, think he would need mechanical support.  Impella now appears to be an  option with no LV thrombus on last echo.   4. LV thrombus: Resolved on 10/3 echo. Heparin off.  5. ID: Afebrile but WBC trending up  26>>31>>27>>15K>>12.6>>15.6>>11.8 >> 20.9.  PCT mildly elevated 0.96. This may be effect of acute MI with post-infarct pericarditis but treating empirically w/ broad spectrum abx, on vanc + cefepime. Possible RLL PNA on CXR.  Blood cultures NGTD,sputum cultures unremarkable. Normal resp flora. Remains AF but WBC climbing. Remains on empiric vancomycin/cefepime  x 7 days. Today is day 7/7 - Strongly encouraged pulmonary toilet. Follow CBC. Reculture ASAP if spikes 6. S/p back surgery: Micro-discectomy last week.  Neurosurgery following, appreciated.  7. Acute Hypoxic Respiratory Failure: Pulmonary edema +/- PNA. CVP down - extubated 10/6, stable on RA. Appreciate PCCM assistance  8. 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 Agitation worse. Remains on home oxycodone regimen q6h - Will start Klonopin 0.5 tid.  D/w CCM  9. Insomnia - continue melatonin  - can use trazodone at night   Glori Bickers, MD 07/04/2020 10:21 AM

## 2020-07-04 NOTE — Progress Notes (Signed)
ANTICOAGULATION CONSULT NOTE - Follow Up Consult  Pharmacy Consult for Heparin Indication: s/p STEMI, previous LV thrombus, awaiting CABG  No Known Allergies  Patient Measurements: Height: 5\' 8"  (172.7 cm) Weight: 75 kg (165 lb 5.5 oz) IBW/kg (Calculated) : 68.4 Heparin Dosing Weight:    Vital Signs: Temp: 98.1 F (36.7 C) (10/10 1237) Temp Source: Oral (10/10 1237) BP: 118/84 (10/10 1237) Pulse Rate: 102 (10/10 1237)  Labs: Recent Labs    07/02/20 0539 07/02/20 0539 07/03/20 0501 07/04/20 0521  HGB 11.1*   < > 10.5* 12.4*  HCT 33.5*  --  32.6* 38.9*  PLT 194  --  199 330  HEPARINUNFRC 0.53  --  0.63 0.57  CREATININE 0.90  --  0.89 1.02   < > = values in this interval not displayed.    Estimated Creatinine Clearance: 84.8 mL/min (by C-G formula based on SCr of 1.02 mg/dL).   Assessment:  Anticoag: Hep for LV thrombus on ECHO 10/2 (resolved, only smoke on repeat ECHO 10/4) - noted pt with lumbar microdiscectomy 9/29 - neurosurge ok with heparin low end goal. Hep level 0.57. Hgb 12.4, Plts 330 up.Coughing up blood-tinged sputum  Goal of Therapy:  Heparin level 0.3-0.7 units/ml Monitor platelets by anticoagulation protocol: Yes   Plan:  -IV heparin at 1950 units/hr - Daily HL and CBC -CABG next week 10/13 - abx completed 10/9    S. 12/9, PharmD, BCPS Clinical Staff Pharmacist [email or web address] Merilynn Finland, Merilynn Finland 07/04/2020,1:32 PM

## 2020-07-04 NOTE — Progress Notes (Signed)
Patient transferred to [facility] via monitored wheelchair. Patient packed all personal belongings and stated  That "I have everything". 2C RN at bedside along with 2C charge. Pt assisted to bathroom on monitor and left in the presence of new RN.  VSS

## 2020-07-05 ENCOUNTER — Inpatient Hospital Stay (HOSPITAL_COMMUNITY): Payer: 59

## 2020-07-05 DIAGNOSIS — Z01810 Encounter for preprocedural cardiovascular examination: Secondary | ICD-10-CM | POA: Diagnosis not present

## 2020-07-05 DIAGNOSIS — R570 Cardiogenic shock: Secondary | ICD-10-CM | POA: Diagnosis not present

## 2020-07-05 DIAGNOSIS — I2111 ST elevation (STEMI) myocardial infarction involving right coronary artery: Secondary | ICD-10-CM | POA: Diagnosis not present

## 2020-07-05 LAB — COOXEMETRY PANEL
Carboxyhemoglobin: 1.2 % (ref 0.5–1.5)
Carboxyhemoglobin: 1.2 % (ref 0.5–1.5)
Carboxyhemoglobin: 1.3 % (ref 0.5–1.5)
Methemoglobin: 0.8 % (ref 0.0–1.5)
Methemoglobin: 0.8 % (ref 0.0–1.5)
Methemoglobin: 0.6 % (ref 0.0–1.5)
O2 Saturation: 51.8 %
O2 Saturation: 52.8 %
O2 Saturation: 58.1 %
Total hemoglobin: 13.1 g/dL (ref 12.0–16.0)
Total hemoglobin: 14.4 g/dL (ref 12.0–16.0)
Total hemoglobin: 11.9 g/dL — ABNORMAL LOW (ref 12.0–16.0)

## 2020-07-05 LAB — GLUCOSE, CAPILLARY
Glucose-Capillary: 106 mg/dL — ABNORMAL HIGH (ref 70–99)
Glucose-Capillary: 108 mg/dL — ABNORMAL HIGH (ref 70–99)
Glucose-Capillary: 112 mg/dL — ABNORMAL HIGH (ref 70–99)
Glucose-Capillary: 125 mg/dL — ABNORMAL HIGH (ref 70–99)
Glucose-Capillary: 158 mg/dL — ABNORMAL HIGH (ref 70–99)
Glucose-Capillary: 95 mg/dL (ref 70–99)

## 2020-07-05 LAB — MAGNESIUM: Magnesium: 2.1 mg/dL (ref 1.7–2.4)

## 2020-07-05 LAB — CBC
HCT: 39.1 % (ref 39.0–52.0)
Hemoglobin: 12.7 g/dL — ABNORMAL LOW (ref 13.0–17.0)
MCH: 29.5 pg (ref 26.0–34.0)
MCHC: 32.5 g/dL (ref 30.0–36.0)
MCV: 90.9 fL (ref 80.0–100.0)
Platelets: 368 K/uL (ref 150–400)
RBC: 4.3 MIL/uL (ref 4.22–5.81)
RDW: 13.2 % (ref 11.5–15.5)
WBC: 18.8 K/uL — ABNORMAL HIGH (ref 4.0–10.5)
nRBC: 0 % (ref 0.0–0.2)

## 2020-07-05 LAB — BASIC METABOLIC PANEL WITH GFR
Anion gap: 12 (ref 5–15)
BUN: 14 mg/dL (ref 6–20)
CO2: 23 mmol/L (ref 22–32)
Calcium: 8.6 mg/dL — ABNORMAL LOW (ref 8.9–10.3)
Chloride: 102 mmol/L (ref 98–111)
Creatinine, Ser: 1.08 mg/dL (ref 0.61–1.24)
GFR, Estimated: >60 mL/min (ref >60)
Glucose, Bld: 135 mg/dL — ABNORMAL HIGH (ref 70–99)
Potassium: 3.7 mmol/L (ref 3.5–5.1)
Sodium: 137 mmol/L (ref 135–145)

## 2020-07-05 LAB — HEPARIN LEVEL (UNFRACTIONATED): Heparin Unfractionated: 0.53 [IU]/mL (ref 0.30–0.70)

## 2020-07-05 MED ORDER — MILRINONE LACTATE IN DEXTROSE 20-5 MG/100ML-% IV SOLN
0.3750 ug/kg/min | INTRAVENOUS | Status: DC
  Administered 2020-07-05 – 2020-07-07 (×3): 0.375 ug/kg/min via INTRAVENOUS
  Filled 2020-07-05 (×3): qty 100

## 2020-07-05 MED ORDER — IVABRADINE HCL 7.5 MG PO TABS
7.5000 mg | ORAL_TABLET | Freq: Two times a day (BID) | ORAL | Status: DC
  Administered 2020-07-05: 7.5 mg via ORAL
  Filled 2020-07-05 (×2): qty 1

## 2020-07-05 NOTE — Progress Notes (Signed)
Pre cabg has been completed.   Preliminary results in CV Proc.   Blanch Media 07/05/2020 2:09 PM

## 2020-07-05 NOTE — Plan of Care (Signed)
  Problem: Education: Goal: Understanding of cardiac disease, CV risk reduction, and recovery process will improve Outcome: Progressing Goal: Understanding of medication regimen will improve Outcome: Progressing Goal: Individualized Educational Video(s) Outcome: Progressing   Problem: Activity: Goal: Ability to tolerate increased activity will improve Outcome: Progressing   Problem: Cardiac: Goal: Ability to achieve and maintain adequate cardiopulmonary perfusion will improve Outcome: Progressing Goal: Vascular access site(s) Level 0-1 will be maintained Outcome: Progressing   Problem: Health Behavior/Discharge Planning: Goal: Ability to safely manage health-related needs after discharge will improve Outcome: Progressing   Problem: Education: Goal: Knowledge of General Education information will improve Description: Including pain rating scale, medication(s)/side effects and non-pharmacologic comfort measures Outcome: Progressing   Problem: Health Behavior/Discharge Planning: Goal: Ability to manage health-related needs will improve Outcome: Progressing   Problem: Clinical Measurements: Goal: Ability to maintain clinical measurements within normal limits will improve Outcome: Progressing Goal: Will remain free from infection Outcome: Progressing Goal: Diagnostic test results will improve Outcome: Progressing Goal: Respiratory complications will improve Outcome: Progressing Goal: Cardiovascular complication will be avoided Outcome: Progressing   Problem: Activity: Goal: Risk for activity intolerance will decrease Outcome: Progressing   Problem: Nutrition: Goal: Adequate nutrition will be maintained Outcome: Progressing   Problem: Coping: Goal: Level of anxiety will decrease Outcome: Progressing   Problem: Elimination: Goal: Will not experience complications related to bowel motility Outcome: Progressing Goal: Will not experience complications related to  urinary retention Outcome: Progressing   Problem: Pain Managment: Goal: General experience of comfort will improve Outcome: Progressing   Problem: Safety: Goal: Ability to remain free from injury will improve Outcome: Progressing   Problem: Skin Integrity: Goal: Risk for impaired skin integrity will decrease Outcome: Progressing   

## 2020-07-05 NOTE — Progress Notes (Addendum)
Patient ID: Kol Consuegra, male   DOB: [birth year], 49 y.o.   MRN: [id number]     Advanced Heart Failure Rounding Note  PCP-Cardiologist: Sherren Mocha, MD    Patient Profile   49 y/o male s/p recent back surgery, admitted for acute inferior and anterolateral MI. LHC showed thrombotic occlusion of distal RCA that was treated with PTCA and thrombectomy.  He was also noted to have chronic total occlusion of the LAD with collaterals from the right (thus this territory was affected by the RCA MI) as well as 90% complex proximal LCx stenosis. EF 25-30% + apical thrombus. RV normal. Course b/c cardiogenic shock and acute hypoxic respiratory failure. ? Component of septic shock.    Subjective:    Extubated 10/6. Remains on Vanc + cefepime for PNA.   Yesterday started on klonopin.   Remains on milrinone 0.25 mcg. CO-OX 52%.   Feeling better today. Pain/anxiety better controlled today  Objective:   Weight Range: 73.7 kg Body mass index is 24.7 kg/m.   Vital Signs:   Temp:  [97.8 F (36.6 C)-99.1 F (37.3 C)] 98.1 F (36.7 C) (10/11 0800) Pulse Rate:  [92-122] 122 (10/11 0827) Resp:  [16-29] 28 (10/11 0800) BP: (104-128)/(60-84) 115/74 (10/11 0800) SpO2:  [90 %-97 %] 96 % (10/11 0800) Weight:  [73.7 kg] 73.7 kg (10/11 0300) Last BM Date: 07/04/20  Weight change: Filed Weights   07/03/20 0600 07/04/20 0600 07/05/20 0300  Weight: 76.3 kg 75 kg 73.7 kg    Intake/Output:   Intake/Output Summary (Last 24 hours) at 07/05/2020 0854 Last data filed at 07/05/2020 0801 Gross per 24 hour  Intake 1157.43 ml  Output 1025 ml  Net 132.43 ml      Physical Exam  CVP 2 General:  Sitting in the chair. No resp difficulty HEENT: normal Neck: supple. no JVD. Carotids 2+ bilat; no bruits. No lymphadenopathy or thryomegaly appreciated. Cor: PMI nondisplaced. Tachy regular rate & rhythm. No rubs, gallops or murmurs. Lungs: clear Abdomen: soft, nontender, nondistended. No  hepatosplenomegaly. No bruits or masses. Good bowel sounds. Extremities: no cyanosis, clubbing, rash, edema. RUE PICC Neuro: alert & orientedx3, cranial nerves grossly intact. moves all 4 extremities w/o difficulty. Affect pleasant   Telemetry   Sinus Tach 110-120s   Labs    CBC Recent Labs    07/04/20 0521 07/05/20 0429  WBC 20.9* 18.8*  HGB 12.4* 12.7*  HCT 38.9* 39.1  MCV 89.4 90.9  PLT 330 794   Basic Metabolic Panel Recent Labs    07/04/20 0521 07/05/20 0429  NA 139 137  K 3.8 3.7  CL 105 102  CO2 21* 23  GLUCOSE 137* 135*  BUN 15 14  CREATININE 1.02 1.08  CALCIUM 8.8* 8.6*  MG 2.2 2.1   Liver Function Tests No results for input(s): AST, ALT, ALKPHOS, BILITOT, PROT, ALBUMIN in the last 72 hours. No results for input(s): LIPASE, AMYLASE in the last 72 hours. Cardiac Enzymes No results for input(s): CKTOTAL, CKMB, CKMBINDEX, TROPONINI in the last 72 hours.  BNP: BNP (last 3 results) Recent Labs    06/25/20 2334 06/26/20 0150  BNP 349.1* 368.4*    ProBNP (last 3 results) No results for input(s): PROBNP in the last 8760 hours.   D-Dimer No results for input(s): DDIMER in the last 72 hours. Hemoglobin A1C No results for input(s): HGBA1C in the last 72 hours. Fasting Lipid Panel No results for input(s): CHOL, HDL, LDLCALC, TRIG, CHOLHDL, LDLDIRECT in the last 72 hours. Thyroid Function  Tests No results for input(s): TSH, T4TOTAL, T3FREE, THYROIDAB in the last 72 hours.  Invalid input(s): FREET3  Other results:   Imaging    DG Chest Port 1 View  Result Date: 07/05/2020 CLINICAL DATA:  Follow-up congestive heart failure. Shortness of breath. EXAM: PORTABLE CHEST 1 VIEW COMPARISON:  07/01/2020 FINDINGS: Left arm PICC line is seen with tip overlying the superior cavoatrial junction. Heart size is within normal limits. Diffuse pulmonary interstitial infiltrates are again seen, with decreased opacity in the lung bases, likely representing decreased  pleural fluid and/or atelectasis. No pneumothorax visualized. Fixation plate and screws again seen in the right clavicle. IMPRESSION: Diffuse pulmonary interstitial edema, with decreased opacity in both lung bases. Electronically Signed   By: Marlaine Hind M.D.   On: 07/05/2020 08:13     Medications:     Scheduled Medications: . aspirin  325 mg Oral BID  . atorvastatin  80 mg Oral Daily  . Chlorhexidine Gluconate Cloth  6 each Topical Daily  . clonazePAM  0.5 mg Oral TID BM  . colchicine  0.6 mg Oral BID  . digoxin  0.125 mg Oral Daily  . docusate  100 mg Oral BID  . feeding supplement (ENSURE ENLIVE)  237 mL Oral TID BM  . furosemide  40 mg Oral Daily  . insulin aspart  1-3 Units Subcutaneous Q4H  . ivabradine  5 mg Oral BID WC  . melatonin  3 mg Oral QHS  . multivitamin with minerals  1 tablet Oral Daily  . oxyCODONE-acetaminophen  1 tablet Oral Q6H  . pantoprazole  40 mg Oral Daily  . polyethylene glycol  17 g Oral Daily  . sodium chloride flush  10-40 mL Intracatheter Q12H  . sodium chloride flush  3 mL Intravenous Q12H  . traZODone  100 mg Oral QHS    Infusions: . sodium chloride 10 mL/hr at 07/04/20 0700  . sodium chloride 10 mL/hr at 06/26/20 0700  . dexmedetomidine (PRECEDEX) IV infusion Stopped (07/04/20 0133)  . heparin 1,950 Units/hr (07/05/20 0300)  . milrinone 0.25 mcg/kg/min (07/05/20 0602)  . norepinephrine (LEVOPHED) Adult infusion Stopped (07/01/20 1806)    PRN Medications: sodium chloride, acetaminophen, ALPRAZolam, fentaNYL (SUBLIMAZE) injection, guaiFENesin-dextromethorphan, nitroGLYCERIN, ondansetron (ZOFRAN) IV, phenol, sodium chloride flush, sodium chloride flush, traZODone    Assessment/Plan   1. CAD: Late presentation inferior MI.  Cath showed thrombotic occlusion distal RCA, CTO LAD with collaterals from right, and complex 90% proximal LCx stenosis.  He had PTCA/thrombectomy RCA with good flow at end of procedure.  Suspect he had damage to LAD  territory as well as RCA territory as RCA collateralized the LAD.  No current s/s ischemia  - ASA/statin.  - Plan is for CABG, tentatively scheduled for 10/14.  Will be quite high risk in setting of depressed EF, marginal cardiac output, deconditioning.  Per Dr Haroldine Laws. Cath films here are no other options for durable revascularization. Co-ox stable. Will continue milrinone at 0.25 for now pending surgery. Placement of Impella to support cardiac surgery now seems feasible with no LV thrombus on last echo.   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 Suspect post-MI pericarditis. ESR elevated at 20.  Much better now. CP resolved - Continue ASA 325 mg bid for pericarditis  - Colchicine 0.6 bid.  - No steroids post-MI, no traditional NSAIDs.  3. Acute systolic CHF>>Cardiogenic Shock: Echo with EF 25-30%, RV ok (no evidence  for RV infarct), LV thrombus.  Repeat bedside echo 10/3  EF 25%, normal RV w/ no pericardial effusion, no LV thrombus seen on this echo.  Remains on milrinone 0.25 mcg. CO-OX 52%. Repeat CO-OX now  - CVP 2. Can hold lasix today.  - Continue digoxin 0.125 daily.  - EKG --> Sinus Tach. Increase ivabradine to 7.5 mg twice a day. - To get him through eventual CABG, think he would need mechanical support.  Impella now appears to be an option with no LV thrombus on last echo.   4. LV thrombus: Resolved on 10/3 echo. On heparin drip. .  5. ID: WBC trending down . 18.8 today.  PCT was  mildly elevated 0.96. This may be effect of acute MI with post-infarct pericarditis but treating empirically w/ broad spectrum abx, on vanc + cefepime. Possible RLL PNA on CXR.  Blood cultures NGTD,sputum cultures unremarkable. Normal resp flora.   Completed 7 days vancomycin/cefepime  x 7 days. 6. S/p back surgery: Micro-discectomy last week.  Neurosurgery following, appreciated.  7. Acute Hypoxic Respiratory Failure:  Pulmonary edema +/- PNA. CVP down - extubated 10/6, stable on RA. Appreciate PCCM assistance  - Now on room air.  8. Possible benzo/narcotic withdrawal: Precedex stopped abruptly on transfer from [facility]. Agitation worse. Started on klonopin.  - Continue  Klonopin 0.5 tid.   9. Insomnia - continue melatonin  - can use trazodone at night  Darrick Grinder, NP-C  07/05/2020 8:54 AM   Patient seen and examined with the above-signed Advanced Practice Provider and/or Housestaff. I personally reviewed laboratory data, imaging studies and relevant notes. I independently examined the patient and formulated the important aspects of the plan. I have edited the note to reflect any of my changes or salient points. I have personally discussed the plan with the patient and/or family.  Feeling better but remains tachycardic. CVP down to 2. Co-ox marginal at 52% on milrinone 0.25. WBC trending down. Has completed abx. Withdrawal symptoms improved on Klonopin.   General:  Sitting in bed  No resp difficulty HEENT: normal Neck: supple. no JVD. Carotids 2+ bilat; no bruits. No lymphadenopathy or thryomegaly appreciated. Cor: PMI nondisplaced. Regular tachy No rubs, gallops or murmurs. Lungs: clear Abdomen: soft, nontender, nondistended. No hepatosplenomegaly. No bruits or masses. Good bowel sounds. Extremities: no cyanosis, clubbing, rash, edema Neuro: alert & orientedx3, cranial nerves grossly intact. moves all 4 extremities w/o difficulty. Affect pleasant  Improving but remains quite tenuous. Co-ox dropped to 52% after cutting back milrinone. Will increase back to 0.375. Stop lasix. Continue pulmonary toilet. Hold lasix.   High-risk CABG 10/13.  Glori Bickers, MD  6:07 PM

## 2020-07-05 NOTE — Progress Notes (Signed)
8 Days Post-Op Procedure(s): INVASIVE LAB ABORTED CASE Subjective: Feels better. Denies CP, breathing slowly improving  Objective: Vital signs in last 24 hours: Temp:  [97.7 F (36.5 C)-99.1 F (37.3 C)] 97.7 F (36.5 C) (10/11 1151) Pulse Rate:  [92-122] 99 (10/11 1151) Cardiac Rhythm: Sinus tachycardia (10/11 0800) Resp:  [16-29] 28 (10/11 1151) BP: (101-128)/(60-83) 101/72 (10/11 1151) SpO2:  [90 %-96 %] 91 % (10/11 1151) Weight:  [73.7 kg] 73.7 kg (10/11 0300)  Hemodynamic parameters for last 24 hours:    Intake/Output from previous day: 10/10 0701 - 10/11 0700 In: 1422.6 [P.O.:900; I.V.:522.6] Out: 625 [Urine:625] Intake/Output this shift: Total I/O In: -  Out: 400 [Urine:400]  General appearance: alert, cooperative and no distress Neurologic: intact Heart: tachycardic Lungs: rales bibasilar  Lab Results: Recent Labs    07/04/20 0521 07/05/20 0429  WBC 20.9* 18.8*  HGB 12.4* 12.7*  HCT 38.9* 39.1  PLT 330 368   BMET:  Recent Labs    07/04/20 0521 07/05/20 0429  NA 139 137  K 3.8 3.7  CL 105 102  CO2 21* 23  GLUCOSE 137* 135*  BUN 15 14  CREATININE 1.02 1.08  CALCIUM 8.8* 8.6*    PT/INR: No results for input(s): LABPROT, INR in the last 72 hours. ABG    Component Value Date/Time   PHART 7.396 06/29/2020 0458   HCO3 32.3 (H) 06/29/2020 0458   TCO2 34 (H) 06/29/2020 0458   ACIDBASEDEF 1.0 06/26/2020 0029   O2SAT 52.8 07/05/2020 1010   CBG (last 3)  Recent Labs    07/05/20 0430 07/05/20 0803 07/05/20 1153  GLUCAP 106* 158* 112*    Assessment/Plan: S/P Procedure(s): INVASIVE LAB ABORTED CASE -Completed 7 days of antibiotics. Now off antibiotics- monitor CXR has improved although there is pulmonary edema Still tachycardic with mild activity Plan for CABG Wednesday 10/13 barring unforeseen circumstances   LOS: 10 days    Loreli Slot 07/05/2020

## 2020-07-05 NOTE — Progress Notes (Signed)
ANTICOAGULATION CONSULT NOTE - Follow Up Consult  Pharmacy Consult for Heparin Indication: s/p STEMI, previous LV thrombus, awaiting CABG  No Known Allergies  Patient Measurements: Height: 5\' 8"  (172.7 cm) Weight: 73.7 kg (162 lb 7.7 oz) IBW/kg (Calculated) : 68.4 Heparin Dosing Weight:    Vital Signs: Temp: 97.7 F (36.5 C) (10/11 1151) Temp Source: Oral (10/11 1151) BP: 101/72 (10/11 1151) Pulse Rate: 99 (10/11 1151)  Labs: Recent Labs    07/03/20 0501 07/03/20 0501 07/04/20 0521 07/05/20 0429  HGB 10.5*   < > 12.4* 12.7*  HCT 32.6*  --  38.9* 39.1  PLT 199  --  330 368  HEPARINUNFRC 0.63  --  0.57 0.53  CREATININE 0.89  --  1.02 1.08   < > = values in this interval not displayed.    Estimated Creatinine Clearance: 80 mL/min (by C-G formula based on SCr of 1.08 mg/dL).   Assessment:  Anticoag: Hep for LV thrombus on ECHO 10/2 (resolved, only smoke on repeat ECHO 10/4) - noted pt with lumbar microdiscectomy 9/29 - neurosurge ok with heparin low end goal. Hep level 0.53. Hgb 12.4, cbc stable. No further hemoptysis noted.   Goal of Therapy:  Heparin level 0.3-0.7 units/ml Monitor platelets by anticoagulation protocol: Yes   Plan:  -IV heparin at 1950 units/hr -CABG 10/13

## 2020-07-06 DIAGNOSIS — I309 Acute pericarditis, unspecified: Secondary | ICD-10-CM | POA: Diagnosis not present

## 2020-07-06 DIAGNOSIS — R570 Cardiogenic shock: Secondary | ICD-10-CM | POA: Diagnosis not present

## 2020-07-06 DIAGNOSIS — I2111 ST elevation (STEMI) myocardial infarction involving right coronary artery: Secondary | ICD-10-CM | POA: Diagnosis not present

## 2020-07-06 LAB — BASIC METABOLIC PANEL
Anion gap: 9 (ref 5–15)
BUN: 10 mg/dL (ref 6–20)
CO2: 24 mmol/L (ref 22–32)
Calcium: 8.5 mg/dL — ABNORMAL LOW (ref 8.9–10.3)
Chloride: 107 mmol/L (ref 98–111)
Creatinine, Ser: 0.98 mg/dL (ref 0.61–1.24)
GFR, Estimated: >60 mL/min (ref >60)
Glucose, Bld: 104 mg/dL — ABNORMAL HIGH (ref 70–99)
Potassium: 3.7 mmol/L (ref 3.5–5.1)
Sodium: 140 mmol/L (ref 135–145)

## 2020-07-06 LAB — ABO/RH: ABO/RH(D): O POS

## 2020-07-06 LAB — CBC
HCT: 37.3 % — ABNORMAL LOW (ref 39.0–52.0)
Hemoglobin: 11.7 g/dL — ABNORMAL LOW (ref 13.0–17.0)
MCH: 28.5 pg (ref 26.0–34.0)
MCHC: 31.4 g/dL (ref 30.0–36.0)
MCV: 91 fL (ref 80.0–100.0)
Platelets: 324 10*3/uL (ref 150–400)
RBC: 4.1 MIL/uL — ABNORMAL LOW (ref 4.22–5.81)
RDW: 13.4 % (ref 11.5–15.5)
WBC: 14.4 10*3/uL — ABNORMAL HIGH (ref 4.0–10.5)
nRBC: 0 % (ref 0.0–0.2)

## 2020-07-06 LAB — COOXEMETRY PANEL
Carboxyhemoglobin: 1.6 % — ABNORMAL HIGH (ref 0.5–1.5)
Methemoglobin: 0.9 % (ref 0.0–1.5)
O2 Saturation: 57.9 %
Total hemoglobin: 12 g/dL (ref 12.0–16.0)

## 2020-07-06 LAB — GLUCOSE, CAPILLARY
Glucose-Capillary: 108 mg/dL — ABNORMAL HIGH (ref 70–99)
Glucose-Capillary: 108 mg/dL — ABNORMAL HIGH (ref 70–99)
Glucose-Capillary: 110 mg/dL — ABNORMAL HIGH (ref 70–99)
Glucose-Capillary: 113 mg/dL — ABNORMAL HIGH (ref 70–99)
Glucose-Capillary: 129 mg/dL — ABNORMAL HIGH (ref 70–99)
Glucose-Capillary: 151 mg/dL — ABNORMAL HIGH (ref 70–99)

## 2020-07-06 LAB — HEPARIN LEVEL (UNFRACTIONATED): Heparin Unfractionated: 0.36 IU/mL (ref 0.30–0.70)

## 2020-07-06 LAB — MAGNESIUM: Magnesium: 2.3 mg/dL (ref 1.7–2.4)

## 2020-07-06 MED ORDER — AMIODARONE HCL IN DEXTROSE 360-4.14 MG/200ML-% IV SOLN
60.0000 mg/h | INTRAVENOUS | Status: AC
  Administered 2020-07-06 (×2): 60 mg/h via INTRAVENOUS
  Filled 2020-07-06: qty 200

## 2020-07-06 MED ORDER — METOPROLOL TARTRATE 12.5 MG HALF TABLET
12.5000 mg | ORAL_TABLET | Freq: Once | ORAL | Status: AC
  Administered 2020-07-07: 12.5 mg via ORAL
  Filled 2020-07-06: qty 1

## 2020-07-06 MED ORDER — NITROGLYCERIN IN D5W 200-5 MCG/ML-% IV SOLN
2.0000 ug/min | INTRAVENOUS | Status: AC
  Administered 2020-07-07: 5 ug/min via INTRAVENOUS
  Filled 2020-07-06: qty 250

## 2020-07-06 MED ORDER — INSULIN REGULAR(HUMAN) IN NACL 100-0.9 UT/100ML-% IV SOLN
INTRAVENOUS | Status: AC
  Administered 2020-07-07: 1.3 [IU]/h via INTRAVENOUS
  Filled 2020-07-06: qty 100

## 2020-07-06 MED ORDER — CHLORHEXIDINE GLUCONATE CLOTH 2 % EX PADS
6.0000 | MEDICATED_PAD | Freq: Once | CUTANEOUS | Status: AC
  Administered 2020-07-06: 6 via TOPICAL

## 2020-07-06 MED ORDER — AMIODARONE HCL IN DEXTROSE 360-4.14 MG/200ML-% IV SOLN
30.0000 mg/h | INTRAVENOUS | Status: DC
  Administered 2020-07-06 – 2020-07-07 (×3): 30 mg/h via INTRAVENOUS
  Filled 2020-07-06 (×2): qty 200

## 2020-07-06 MED ORDER — BISACODYL 5 MG PO TBEC
5.0000 mg | DELAYED_RELEASE_TABLET | Freq: Once | ORAL | Status: AC
  Administered 2020-07-06: 5 mg via ORAL
  Filled 2020-07-06: qty 1

## 2020-07-06 MED ORDER — DEXMEDETOMIDINE HCL IN NACL 400 MCG/100ML IV SOLN
0.1000 ug/kg/h | INTRAVENOUS | Status: AC
  Administered 2020-07-07: .5 ug/kg/h via INTRAVENOUS
  Filled 2020-07-06: qty 100

## 2020-07-06 MED ORDER — TRANEXAMIC ACID (OHS) BOLUS VIA INFUSION
15.0000 mg/kg | INTRAVENOUS | Status: AC
  Administered 2020-07-07: 1071 mg via INTRAVENOUS
  Filled 2020-07-06: qty 1071

## 2020-07-06 MED ORDER — SODIUM CHLORIDE 0.9 % IV SOLN
750.0000 mg | INTRAVENOUS | Status: AC
  Administered 2020-07-07: 750 mg via INTRAVENOUS
  Filled 2020-07-06: qty 750

## 2020-07-06 MED ORDER — VANCOMYCIN HCL 1250 MG/250ML IV SOLN
1250.0000 mg | INTRAVENOUS | Status: AC
  Administered 2020-07-07: 1250 mg via INTRAVENOUS
  Filled 2020-07-06: qty 250

## 2020-07-06 MED ORDER — AMIODARONE HCL IN DEXTROSE 360-4.14 MG/200ML-% IV SOLN
INTRAVENOUS | Status: AC
  Administered 2020-07-06: 150 mg via INTRAVENOUS
  Filled 2020-07-06: qty 200

## 2020-07-06 MED ORDER — MENTHOL 3 MG MT LOZG
1.0000 | LOZENGE | OROMUCOSAL | Status: DC | PRN
  Filled 2020-07-06: qty 9

## 2020-07-06 MED ORDER — DIAZEPAM 5 MG PO TABS
5.0000 mg | ORAL_TABLET | Freq: Once | ORAL | Status: AC
  Administered 2020-07-07: 5 mg via ORAL
  Filled 2020-07-06: qty 1

## 2020-07-06 MED ORDER — PLASMA-LYTE 148 IV SOLN
INTRAVENOUS | Status: DC
  Filled 2020-07-06: qty 2.5

## 2020-07-06 MED ORDER — TRANEXAMIC ACID 1000 MG/10ML IV SOLN
1.5000 mg/kg/h | INTRAVENOUS | Status: AC
  Administered 2020-07-07: 1.5 mg/kg/h via INTRAVENOUS
  Filled 2020-07-06: qty 25

## 2020-07-06 MED ORDER — PHENYLEPHRINE HCL-NACL 20-0.9 MG/250ML-% IV SOLN
30.0000 ug/min | INTRAVENOUS | Status: AC
  Administered 2020-07-07: 40 ug/min via INTRAVENOUS
  Filled 2020-07-06: qty 250

## 2020-07-06 MED ORDER — MAGNESIUM SULFATE 50 % IJ SOLN
40.0000 meq | INTRAMUSCULAR | Status: DC
  Filled 2020-07-06: qty 9.85

## 2020-07-06 MED ORDER — AMIODARONE LOAD VIA INFUSION
150.0000 mg | Freq: Once | INTRAVENOUS | Status: AC
  Filled 2020-07-06: qty 83.34

## 2020-07-06 MED ORDER — TRANEXAMIC ACID (OHS) PUMP PRIME SOLUTION
2.0000 mg/kg | INTRAVENOUS | Status: DC
  Filled 2020-07-06: qty 1.43

## 2020-07-06 MED ORDER — CHLORHEXIDINE GLUCONATE CLOTH 2 % EX PADS
6.0000 | MEDICATED_PAD | Freq: Once | CUTANEOUS | Status: AC
  Administered 2020-07-07: 6 via TOPICAL

## 2020-07-06 MED ORDER — MILRINONE LACTATE IN DEXTROSE 20-5 MG/100ML-% IV SOLN
0.3000 ug/kg/min | INTRAVENOUS | Status: DC
  Filled 2020-07-06: qty 100

## 2020-07-06 MED ORDER — SODIUM CHLORIDE 0.9 % IV SOLN
INTRAVENOUS | Status: DC
  Filled 2020-07-06: qty 30

## 2020-07-06 MED ORDER — POTASSIUM CHLORIDE 2 MEQ/ML IV SOLN
80.0000 meq | INTRAVENOUS | Status: DC
  Filled 2020-07-06: qty 40

## 2020-07-06 MED ORDER — NOREPINEPHRINE 4 MG/250ML-% IV SOLN
0.0000 ug/min | INTRAVENOUS | Status: AC
  Administered 2020-07-07: 2 ug/min via INTRAVENOUS
  Filled 2020-07-06: qty 250

## 2020-07-06 MED ORDER — SODIUM CHLORIDE 0.9 % IV SOLN
1.5000 g | INTRAVENOUS | Status: AC
  Administered 2020-07-07: 1.5 g via INTRAVENOUS
  Filled 2020-07-06: qty 1.5

## 2020-07-06 MED ORDER — CHLORHEXIDINE GLUCONATE 0.12 % MT SOLN
15.0000 mL | Freq: Once | OROMUCOSAL | Status: AC
  Administered 2020-07-07: 15 mL via OROMUCOSAL
  Filled 2020-07-06: qty 15

## 2020-07-06 MED ORDER — EPINEPHRINE HCL 5 MG/250ML IV SOLN IN NS
0.0000 ug/min | INTRAVENOUS | Status: DC
  Filled 2020-07-06: qty 250

## 2020-07-06 NOTE — Progress Notes (Signed)
  Amiodarone Drug - Drug Interaction Consult Note  Recommendations: Consider empiric dose reduction Digoxin  Monitor Zofran use  Amiodarone is metabolized by the cytochrome P450 system and therefore has the potential to cause many drug interactions. Amiodarone has an average plasma half-life of 50 days (range 20 to 100 days).   There is potential for drug interactions to occur several weeks or months after stopping treatment and the onset of drug interactions may be slow after initiating amiodarone.   [x]  Statins: Increased risk of myopathy. Simvastatin- restrict dose to 20mg  daily. Other statins: counsel patients to report any muscle pain or weakness immediately.  []  Anticoagulants: Amiodarone can increase anticoagulant effect. Consider warfarin dose reduction. Patients should be monitored closely and the dose of anticoagulant altered accordingly, remembering that amiodarone levels take several weeks to stabilize.  []  Antiepileptics: Amiodarone can increase plasma concentration of phenytoin, the dose should be reduced. Note that small changes in phenytoin dose can result in large changes in levels. Monitor patient and counsel on signs of toxicity.  []  Beta blockers: increased risk of bradycardia, AV block and myocardial depression. Sotalol - avoid concomitant use.  []   Calcium channel blockers (diltiazem and verapamil): increased risk of bradycardia, AV block and myocardial depression.  []   Cyclosporine: Amiodarone increases levels of cyclosporine. Reduced dose of cyclosporine is recommended.  [x]  Digoxin dose should be halved when amiodarone is started.  []  Diuretics: increased risk of cardiotoxicity if hypokalemia occurs.  []  Oral hypoglycemic agents (glyburide, glipizide, glimepiride): increased risk of hypoglycemia. Patient's glucose levels should be monitored closely when initiating amiodarone therapy.   [x]  Drugs that prolong the QT interval:  Torsades de pointes risk may be  increased with concurrent use - avoid if possible.  Monitor QTc, also keep magnesium/potassium WNL if concurrent therapy can't be avoided. Antibiotics: e.g. fluoroquinolones, erythromycin. . Antiarrhythmics: e.g. quinidine, procainamide, disopyramide, sotalol. . Antipsychotics: e.g. phenothiazines, haloperidol.  . Lithium, tricyclic antidepressants, and methadone. Thank You,   07/06/2020 6:18 AM

## 2020-07-06 NOTE — Progress Notes (Signed)
Patient ID: Jerry [name], male   DOB: [birth year], 49 y.o.   MRN: [id number]     Advanced Heart Failure Rounding Note  PCP-Cardiologist: Sherren Mocha, MD    Patient Profile   49 y/o male s/p recent back surgery, admitted for acute inferior and anterolateral MI. LHC showed thrombotic occlusion of distal RCA that was treated with PTCA and thrombectomy.  He was also noted to have chronic total occlusion of the LAD with collaterals from the right (thus this territory was affected by the RCA MI) as well as 90% complex proximal LCx stenosis. EF 25-30% + apical thrombus. RV normal. Course b/c cardiogenic shock and acute hypoxic respiratory failure. ? Component of septic shock.    Subjective:    Milrinone increased back to 0.375 yesterday due to low co-ox.  Became agitated last night after girlfriend left and felt like bugs crawling on him. telesitter placed. Concern that GF may have given him drugs.  Co-ox up to 58% this am Denies CP. Developed AF with RVR this am rates 150-170  Remains on klonpin 0.5 tid   Objective:   Weight Range: 71.4 kg Body mass index is 23.93 kg/m.   Vital Signs:   Temp:  [97.7 F (36.5 C)-98.7 F (37.1 C)] 98.7 F (37.1 C) (10/12 0403) Pulse Rate:  [87-122] 92 (10/12 0403) Resp:  [18-28] 20 (10/12 0403) BP: (90-115)/(56-77) 90/56 (10/12 0403) SpO2:  [91 %-96 %] 95 % (10/12 0403) Weight:  [71.4 kg] 71.4 kg (10/12 0403) Last BM Date: 07/05/20 (per pt)  Weight change: Filed Weights   07/04/20 0600 07/05/20 0300 07/06/20 0403  Weight: 75 kg 73.7 kg 71.4 kg    Intake/Output:   Intake/Output Summary (Last 24 hours) at 07/06/2020 0559 Last data filed at 07/05/2020 2200 Gross per 24 hour  Intake 370.32 ml  Output 400 ml  Net -29.68 ml      Physical Exam   General:  Agitated shaky. No resp difficulty HEENT: normal Neck: supple. no JVD. Carotids 2+ bilat; no bruits. No lymphadenopathy or thryomegaly appreciated. Cor: PMI nondisplaced. Irregular  tachy  No rubs, gallops or murmurs. Lungs: clear Abdomen: soft, nontender, nondistended. No hepatosplenomegaly. No bruits or masses. Good bowel sounds. Extremities: no cyanosis, clubbing, rash, edema Neuro: alert & orientedx3, cranial nerves grossly intact. moves all 4 extremities w/o difficulty. Affect pleasant  Telemetry   AF 150-170s starting this am Personally reviewed  Labs    CBC Recent Labs    07/05/20 0429 07/06/20 0408  WBC 18.8* 14.4*  HGB 12.7* 11.7*  HCT 39.1 37.3*  MCV 90.9 91.0  PLT 368 326   Basic Metabolic Panel Recent Labs    07/05/20 0429 07/06/20 0408  NA 137 140  K 3.7 3.7  CL 102 107  CO2 23 24  GLUCOSE 135* 104*  BUN 14 10  CREATININE 1.08 0.98  CALCIUM 8.6* 8.5*  MG 2.1 2.3   Liver Function Tests No results for input(s): AST, ALT, ALKPHOS, BILITOT, PROT, ALBUMIN in the last 72 hours. No results for input(s): LIPASE, AMYLASE in the last 72 hours. Cardiac Enzymes No results for input(s): CKTOTAL, CKMB, CKMBINDEX, TROPONINI in the last 72 hours.  BNP: BNP (last 3 results) Recent Labs    06/25/20 2334 06/26/20 0150  BNP 349.1* 368.4*    ProBNP (last 3 results) No results for input(s): PROBNP in the last 8760 hours.   D-Dimer No results for input(s): DDIMER in the last 72 hours. Hemoglobin A1C No results for input(s): HGBA1C in the  last 72 hours. Fasting Lipid Panel No results for input(s): CHOL, HDL, LDLCALC, TRIG, CHOLHDL, LDLDIRECT in the last 72 hours. Thyroid Function Tests No results for input(s): TSH, T4TOTAL, T3FREE, THYROIDAB in the last 72 hours.  Invalid input(s): FREET3  Other results:   Imaging    DG Chest Port 1 View  Result Date: 07/05/2020 CLINICAL DATA:  Follow-up congestive heart failure. Shortness of breath. EXAM: PORTABLE CHEST 1 VIEW COMPARISON:  07/01/2020 FINDINGS: Left arm PICC line is seen with tip overlying the superior cavoatrial junction. Heart size is within normal limits. Diffuse pulmonary  interstitial infiltrates are again seen, with decreased opacity in the lung bases, likely representing decreased pleural fluid and/or atelectasis. No pneumothorax visualized. Fixation plate and screws again seen in the right clavicle. IMPRESSION: Diffuse pulmonary interstitial edema, with decreased opacity in both lung bases. Electronically Signed   By: Marlaine Hind M.D.   On: 07/05/2020 08:13   VAS US DOPPLER PRE CABG  Result Date: 07/05/2020 PREOPERATIVE VASCULAR EVALUATION  Indications:      Pre-CABG. Risk Factors:     Coronary artery disease. Comparison Study: no prior Performing Technologist: Abram Sander RVS  Examination Guidelines: A complete evaluation includes B-mode imaging, spectral Doppler, color Doppler, and power Doppler as needed of all accessible portions of each vessel. Bilateral testing is considered an integral part of a complete examination. Limited examinations for reoccurring indications may be performed as noted.  Right Carotid Findings: +----------+--------+--------+--------+------------+--------+           PSV cm/sEDV cm/sStenosisDescribe    Comments +----------+--------+--------+--------+------------+--------+ CCA Prox  115     9               heterogenous         +----------+--------+--------+--------+------------+--------+ CCA Distal91      15              heterogenous         +----------+--------+--------+--------+------------+--------+ ICA Prox  60      18      1-39%   heterogenous         +----------+--------+--------+--------+------------+--------+ ICA Distal81      28                                   +----------+--------+--------+--------+------------+--------+ ECA       80      12                                   +----------+--------+--------+--------+------------+--------+ Portions of this table do not appear on this page. +----------+--------+-------+--------+------------+           PSV cm/sEDV cmsDescribeArm Pressure  +----------+--------+-------+--------+------------+ Subclavian124                                 +----------+--------+-------+--------+------------+ +---------+--------+--+--------+-+---------+ VertebralPSV cm/s45EDV cm/s9Antegrade +---------+--------+--+--------+-+---------+ Left Carotid Findings: +----------+--------+--------+--------+------------+--------+           PSV cm/sEDV cm/sStenosisDescribe    Comments +----------+--------+--------+--------+------------+--------+ CCA Prox  144     20              heterogenous         +----------+--------+--------+--------+------------+--------+ CCA Distal89      19              heterogenous         +----------+--------+--------+--------+------------+--------+  ICA Prox  57      23      1-39%   heterogenous         +----------+--------+--------+--------+------------+--------+ ICA Distal70      25                                   +----------+--------+--------+--------+------------+--------+ ECA       71      20                                   +----------+--------+--------+--------+------------+--------+ +----------+--------+--------+--------+------------+ SubclavianPSV cm/sEDV cm/sDescribeArm Pressure +----------+--------+--------+--------+------------+           69                                   +----------+--------+--------+--------+------------+ +---------+--------+--+--------+--+---------+ VertebralPSV cm/s43EDV cm/s12Antegrade +---------+--------+--+--------+--+---------+  ABI Findings: +--------+------------------+-----+---------+--------+ Right   Rt Pressure (mmHg)IndexWaveform Comment  +--------+------------------+-----+---------+--------+ [id number]                     triphasic         +--------+------------------+-----+---------+--------+ ATA                            triphasic         +--------+------------------+-----+---------+--------+ PTA                             triphasic         +--------+------------------+-----+---------+--------+ +--------+------------------+-----+---------+-------+ Left    Lt Pressure (mmHg)IndexWaveform Comment +--------+------------------+-----+---------+-------+ Brachial                       triphasic        +--------+------------------+-----+---------+-------+ ATA                            triphasic        +--------+------------------+-----+---------+-------+ PTA                            triphasic        +--------+------------------+-----+---------+-------+  Right Doppler Findings: +--------+--------+-----+---------+--------+ Site    PressureIndexDoppler  Comments +--------+--------+-----+---------+--------+ [id number]           triphasic         +--------+--------+-----+---------+--------+ Radial               triphasic         +--------+--------+-----+---------+--------+ Ulnar                biphasic          +--------+--------+-----+---------+--------+  Left Doppler Findings: +--------+--------+-----+---------+--------+ Site    PressureIndexDoppler  Comments +--------+--------+-----+---------+--------+ Brachial             triphasic         +--------+--------+-----+---------+--------+ Radial               biphasic          +--------+--------+-----+---------+--------+ Ulnar                triphasic         +--------+--------+-----+---------+--------+  Summary: Right Carotid: Velocities in the right ICA are consistent with  a 1-39% stenosis. Left Carotid: Velocities in the left ICA are consistent with a 1-39% stenosis. Vertebrals: Bilateral vertebral arteries demonstrate antegrade flow. Right Upper Extremity: Doppler waveform obliterate with right radial compression. Doppler waveforms remain within normal limits with right ulnar compression. Left Upper Extremity: Doppler waveforms remain within normal limits with left radial compression. Doppler waveform  obliterate with left ulnar compression.  Electronically signed by Monica Martinez MD on 07/05/2020 at 5:31:56 PM.    Final      Medications:     Scheduled Medications: . aspirin  325 mg Oral BID  . atorvastatin  80 mg Oral Daily  . Chlorhexidine Gluconate Cloth  6 each Topical Daily  . clonazePAM  0.5 mg Oral TID BM  . colchicine  0.6 mg Oral BID  . digoxin  0.125 mg Oral Daily  . docusate  100 mg Oral BID  . feeding supplement (ENSURE ENLIVE)  237 mL Oral TID BM  . insulin aspart  1-3 Units Subcutaneous Q4H  . ivabradine  7.5 mg Oral BID WC  . melatonin  3 mg Oral QHS  . multivitamin with minerals  1 tablet Oral Daily  . oxyCODONE-acetaminophen  1 tablet Oral Q6H  . pantoprazole  40 mg Oral Daily  . polyethylene glycol  17 g Oral Daily  . sodium chloride flush  10-40 mL Intracatheter Q12H  . sodium chloride flush  3 mL Intravenous Q12H  . traZODone  100 mg Oral QHS    Infusions: . sodium chloride 10 mL/hr at 07/04/20 0700  . sodium chloride 10 mL/hr at 06/26/20 0700  . dexmedetomidine (PRECEDEX) IV infusion Stopped (07/04/20 0133)  . heparin 1,950 Units/hr (07/06/20 0216)  . milrinone 0.375 mcg/kg/min (07/05/20 2158)  . norepinephrine (LEVOPHED) Adult infusion Stopped (07/01/20 1806)    PRN Medications: sodium chloride, acetaminophen, ALPRAZolam, fentaNYL (SUBLIMAZE) injection, guaiFENesin-dextromethorphan, nitroGLYCERIN, ondansetron (ZOFRAN) IV, phenol, sodium chloride flush, sodium chloride flush, traZODone    Assessment/Plan   1. CAD: Late presentation inferior MI.  Cath showed thrombotic occlusion distal RCA, CTO LAD with collaterals from right, and complex 90% proximal LCx stenosis.  He had PTCA/thrombectomy RCA with good flow at end of procedure.  Suspect he had damage to LAD territory as well as RCA territory as RCA collateralized the LAD.  No current s/s ischemia - ASA/statin.  -  Cath films here are no other options for durable revascularization. Plan is for  CABG, tentatively scheduled for 10/13.  Will be quite high risk in setting of depressed EF, marginal cardiac output, deconditioning.  - Co-ox improved on milrinone 0.375. Will continue milrinone at 0.375 for now pending surgery. Placement of Impella to support cardiac surgery now seems feasible with no LV thrombus on last echo.   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 Suspect post-MI pericarditis. ESR elevated at 20.  Much better now. CP resolved. No change - Continue ASA 325 mg bid for pericarditis  - Colchicine 0.6 bid.  - No steroids post-MI, no traditional NSAIDs.  3. Acute systolic CHF>>Cardiogenic Shock: Echo with EF 25-30%, RV ok (no evidence for RV infarct), LV thrombus.  Repeat bedside echo 10/3  EF 25%, normal RV w/ no pericardial effusion, no LV thrombus seen on this echo.  - Milrinone back up to 0.375. Co-ox improved to 58% continue - Lasix on hold with low CVP. Continue to hold today.  - Continue digoxin 0.125 daily.  - Sinus tach  improved with ivabradine but now with AF with RVR. Stop ivabradine. Start IV amio  - To get him through eventual CABG, think he would need mechanical support.  Impella now appears to be an option with no LV thrombus on last echo.   4. AF with RVR - new this am - stop ivabradine - start IV amio  5. LV thrombus: Resolved on 10/3 echo. On heparin drip. .  6. ID: WBC trending down . 18.8 -> 14.4 today.  PCT was  mildly elevated 0.96. This may be effect of acute MI with post-infarct pericarditis but treating empirically w/ broad spectrum abx, on vanc + cefepime. Possible RLL PNA on CXR.  Blood cultures NGTD,sputum cultures unremarkable. Normal resp flora.   Completed 7 days vancomycin/cefepime  x 7 days. - remains AF 8. S/p back surgery: Micro-discectomy last week.  Neurosurgery following, appreciated.  9. Acute Hypoxic Respiratory Failure: Pulmonary edema +/- PNA. CVP  down - extubated 10/6, stable on RA. Appreciate PCCM assistance  - Now on room air.  10. Possible benzo/narcotic withdrawal: Precedex stopped abruptly on transfer from [facility]. Agitation worse. Started on klonopin. More agitation overnight - concern GF brought him drugs. Now with telesitter. Will need to d/w RN supervisor regarding supervised visits only - Continue  Klonopin 0.5 tid.   - will d/w possible zyprexa with PharmD  11. Insomnia - continue melatonin  - can use trazodone at night  Glori Bickers, MD 07/06/2020 5:59 AM

## 2020-07-06 NOTE — H&P (View-Only) (Signed)
9 Days Post-Op Procedure(s): INVASIVE LAB ABORTED CASE Subjective: Confused with mild hallucinations overnight Went into rapid A fib, now back in SR  Objective: Vital signs in last 24 hours: Temp:  [97.7 F (36.5 C)-98.7 F (37.1 C)] 98.7 F (37.1 C) (10/12 0403) Pulse Rate:  [87-102] 92 (10/12 0403) Cardiac Rhythm: Sinus tachycardia (10/12 0716) Resp:  [18-28] 20 (10/12 0403) BP: (90-115)/(56-77) 90/56 (10/12 0403) SpO2:  [91 %-96 %] 95 % (10/12 0403) Weight:  [71.4 kg] 71.4 kg (10/12 0403)  Hemodynamic parameters for last 24 hours:    Intake/Output from previous day: 10/11 0701 - 10/12 0700 In: 370.3 [I.V.:370.3] Out: 400 [Urine:400] Intake/Output this shift: No intake/output data recorded.  General appearance: anxious Neurologic: intact Heart: tachy, regular Lungs: diminished breath sounds bibasilar  Lab Results: Recent Labs    07/05/20 0429 07/06/20 0408  WBC 18.8* 14.4*  HGB 12.7* 11.7*  HCT 39.1 37.3*  PLT 368 324   BMET:  Recent Labs    07/05/20 0429 07/06/20 0408  NA 137 140  K 3.7 3.7  CL 102 107  CO2 23 24  GLUCOSE 135* 104*  BUN 14 10  CREATININE 1.08 0.98  CALCIUM 8.6* 8.5*    PT/INR: No results for input(s): LABPROT, INR in the last 72 hours. ABG    Component Value Date/Time   PHART 7.396 06/29/2020 0458   HCO3 32.3 (H) 06/29/2020 0458   TCO2 34 (H) 06/29/2020 0458   ACIDBASEDEF 1.0 06/26/2020 0029   O2SAT 57.9 07/06/2020 0408   CBG (last 3)  Recent Labs    07/06/20 0022 07/06/20 0422 07/06/20 0821  GLUCAP 108* 110* 113*    Assessment/Plan: S/P Procedure(s): INVASIVE LAB ABORTED CASE -Severe 3 vessel CAD, s/p STEMI, ischemic cardiomyopathy, cardiogenic shock, pulmonary edema. At this point I don't think there is any benefit to further delaying surgical revascularization. This is a high risk procedure and will likely require mechanical support perioperatively. Will plan to place Impella if no LV thrombus on intraop  TEE.  Delirium is concerning, will continue benzos postop  I discussed the general nature of the procedure, the need for general anesthesia, the incisions to be used, the use of cardiopulmonary bypass and the use of drainage tubes postoperatively with Jerry [name]. We discussed the expected hospital stay, overall recovery and short and long term outcomes. I informed him of the indications, risks, benefits and alternatives.  He understand the risks include, but are not limited to death, stroke, MI, DVT/PE, bleeding, possible need for transfusion, infections, cardiac arrhythmias, as well as other organ system dysfunction including respiratory, renal, or GI complications.  He understands the high risk nature of the procedure. He accepts the risks and agrees to proceed.  For CABG, possible left radial, possible Impella placement tomorrow AM   LOS: 11 days    Loreli Slot 07/06/2020

## 2020-07-06 NOTE — Anesthesia Preprocedure Evaluation (Addendum)
Anesthesia Evaluation  Patient identified by MRN, date of birth, ID band Patient awake    Reviewed: Allergy & Precautions, NPO status , Patient's Chart, lab work & pertinent test results  Airway Mallampati: II  TM Distance: >3 FB Neck ROM: Full    Dental  (+) Dental Advisory Given   Pulmonary neg pulmonary ROS,    breath sounds clear to auscultation       Cardiovascular + CAD, + Past MI and +CHF   Rhythm:Regular Rate:Normal     Neuro/Psych negative neurological ROS     GI/Hepatic negative GI ROS, Neg liver ROS,   Endo/Other    Renal/GU negative Renal ROS     Musculoskeletal   Abdominal   Peds  Hematology  (+) anemia ,   Anesthesia Other Findings   Reproductive/Obstetrics                            Lab Results  Component Value Date   WBC 14.4 (H) 07/06/2020   HGB 11.7 (L) 07/06/2020   HCT 37.3 (L) 07/06/2020   MCV 91.0 07/06/2020   PLT 324 07/06/2020   Lab Results  Component Value Date   CREATININE 0.98 07/06/2020   BUN 10 07/06/2020   NA 140 07/06/2020   K 3.7 07/06/2020   CL 107 07/06/2020   CO2 24 07/06/2020   1. Findings consistent with servere ischemic DCM. Global hypokinesis with  septal/apical aneurysm Smoke/Spontaneoius contrast but no formed thrombus  . Left ventricular ejection fraction, by estimation, is 20 to 25%. The  left ventricle has severely  decreased function. The left ventricle has no regional wall motion  abnormalities. The left ventricular internal cavity size was severely  dilated. Left ventricular diastolic function could not be evaluated.  2. Right ventricular systolic function is normal. The right ventricular  size is normal.  3. Left atrial size was moderately dilated.  4. The mitral valve is normal in structure. Moderate mitral valve  regurgitation. No evidence of mitral stenosis.  5. The aortic valve was not assessed. Aortic valve  regurgitation is not  visualized. No aortic stenosis is present.  6. The inferior vena cava is normal in size with greater than 50%  respiratory variability, suggesting right atrial pressure of 3 mmHg. Anesthesia Physical Anesthesia Plan  ASA: IV  Anesthesia Plan: General   Post-op Pain Management:    Induction: Intravenous  PONV Risk Score and Plan: 2 and Treatment may vary due to age or medical condition  Airway Management Planned: Oral ETT  Additional Equipment: Arterial line, CVP, PA Cath, TEE and Ultrasound Guidance Line Placement  Intra-op Plan:   Post-operative Plan: Post-operative intubation/ventilation  Informed Consent: I have reviewed the patients History and Physical, chart, labs and discussed the procedure including the risks, benefits and alternatives for the proposed anesthesia with the patient or authorized representative who has indicated his/her understanding and acceptance.     Dental advisory given  Plan Discussed with:   Anesthesia Plan Comments:        Anesthesia Quick Evaluation

## 2020-07-06 NOTE — Progress Notes (Signed)
ANTICOAGULATION CONSULT NOTE - Follow Up Consult  Pharmacy Consult for Heparin Indication: s/p STEMI, previous LV thrombus, awaiting CABG  No Known Allergies  Patient Measurements: Height: 5\' 8"  (172.7 cm) Weight: 71.4 kg (157 lb 6.5 oz) IBW/kg (Calculated) : 68.4 Heparin Dosing Weight:    Vital Signs: Temp: 97.1 F (36.2 C) (10/12 0851) Temp Source: Oral (10/12 0851) BP: 120/71 (10/12 0851) Pulse Rate: 109 (10/12 0851)  Labs: Recent Labs    07/04/20 0521 07/04/20 0521 07/05/20 0429 07/06/20 0408  HGB 12.4*   < > 12.7* 11.7*  HCT 38.9*  --  39.1 37.3*  PLT 330  --  368 324  HEPARINUNFRC 0.57  --  0.53 0.36  CREATININE 1.02  --  1.08 0.98   < > = values in this interval not displayed.    Estimated Creatinine Clearance: 88.2 mL/min (by C-G formula based on SCr of 0.98 mg/dL).   Assessment:  Anticoag: Hep for LV thrombus on ECHO 10/2 (resolved, only smoke on repeat ECHO 10/4) - noted pt with lumbar microdiscectomy 9/29 - neurosurge ok with heparin low end goal. Hep level 0.3 on heparin drip rate 1950 uts/hr. cbc stable. No further hemoptysis noted.   Goal of Therapy:  Heparin level 0.3-0.7 units/ml Monitor platelets by anticoagulation protocol: Yes   Plan:  -continue IV heparin at 1950 units/hr - off prior to OR -CABG 10/13   11/13 Pharm.D. CPP, BCPS Clinical Pharmacist [phone] 07/06/2020 12:15 PM

## 2020-07-06 NOTE — Progress Notes (Signed)
RN noticed pt out of bed and behaving erratically. Pt was removing cardiac leads and clothes. Pt told RN " I feel like bugs are crawling through my veins trying to eat my brain." RN helped pt back to bed and placed leads and gown back on pt. Pt went to sleep until tech entered to check CBG. At this point pt told tech " I took too much acid before I went to bed." Pts girlfriend had visited the pt this evening and left at 8PM.Tech made RN aware of pts statement. RN text paged cardiology on call to get order for tele-sitter. Order placed and implemented. Will continue to monitor.

## 2020-07-06 NOTE — Progress Notes (Signed)
CARDIAC REHAB PHASE I   Went to see pt. Pt states he is feeling much better, glad he got some sleep last night. Pt aware of hallucinations he experienced last night, believes it stems from lack of sleep since admission. Pt states continued IS use and ambulation. Denies questions or concerns about upcoming surgery. Will continue to follow.  [id number] Reynold Bowen, RN BSN 07/06/2020 10:24 AM

## 2020-07-06 NOTE — Progress Notes (Signed)
9 Days Post-Op Procedure(s): INVASIVE LAB ABORTED CASE Subjective: Confused with mild hallucinations overnight Went into rapid A fib, now back in SR  Objective: Vital signs in last 24 hours: Temp:  [97.7 F (36.5 C)-98.7 F (37.1 C)] 98.7 F (37.1 C) (10/12 0403) Pulse Rate:  [87-102] 92 (10/12 0403) Cardiac Rhythm: Sinus tachycardia (10/12 0716) Resp:  [18-28] 20 (10/12 0403) BP: (90-115)/(56-77) 90/56 (10/12 0403) SpO2:  [91 %-96 %] 95 % (10/12 0403) Weight:  [71.4 kg] 71.4 kg (10/12 0403)  Hemodynamic parameters for last 24 hours:    Intake/Output from previous day: 10/11 0701 - 10/12 0700 In: 370.3 [I.V.:370.3] Out: 400 [Urine:400] Intake/Output this shift: No intake/output data recorded.  General appearance: anxious Neurologic: intact Heart: tachy, regular Lungs: diminished breath sounds bibasilar  Lab Results: Recent Labs    07/05/20 0429 07/06/20 0408  WBC 18.8* 14.4*  HGB 12.7* 11.7*  HCT 39.1 37.3*  PLT 368 324   BMET:  Recent Labs    07/05/20 0429 07/06/20 0408  NA 137 140  K 3.7 3.7  CL 102 107  CO2 23 24  GLUCOSE 135* 104*  BUN 14 10  CREATININE 1.08 0.98  CALCIUM 8.6* 8.5*    PT/INR: No results for input(s): LABPROT, INR in the last 72 hours. ABG    Component Value Date/Time   PHART 7.396 06/29/2020 0458   HCO3 32.3 (H) 06/29/2020 0458   TCO2 34 (H) 06/29/2020 0458   ACIDBASEDEF 1.0 06/26/2020 0029   O2SAT 57.9 07/06/2020 0408   CBG (last 3)  Recent Labs    07/06/20 0022 07/06/20 0422 07/06/20 0821  GLUCAP 108* 110* 113*    Assessment/Plan: S/P Procedure(s): INVASIVE LAB ABORTED CASE -Severe 3 vessel CAD, s/p STEMI, ischemic cardiomyopathy, cardiogenic shock, pulmonary edema. At this point I don't think there is any benefit to further delaying surgical revascularization. This is a high risk procedure and will likely require mechanical support perioperatively. Will plan to place Impella if no LV thrombus on intraop  TEE.  Delirium is concerning, will continue benzos postop  I discussed the general nature of the procedure, the need for general anesthesia, the incisions to be used, the use of cardiopulmonary bypass and the use of drainage tubes postoperatively with Mr. Victorian. We discussed the expected hospital stay, overall recovery and short and long term outcomes. I informed him of the indications, risks, benefits and alternatives.  He understand the risks include, but are not limited to death, stroke, MI, DVT/PE, bleeding, possible need for transfusion, infections, cardiac arrhythmias, as well as other organ system dysfunction including respiratory, renal, or GI complications.  He understands the high risk nature of the procedure. He accepts the risks and agrees to proceed.  For CABG, possible left radial, possible Impella placement tomorrow AM   LOS: 11 days     C  07/06/2020  

## 2020-07-06 NOTE — Progress Notes (Signed)
      [address]       [place] [address]             [phone]      Vitals:   07/05/20 2300 07/05/20 2356 07/06/20 0300 07/06/20 0403  BP:  90/77  (!) 90/56  Pulse: 87 93  92  Temp:  98 F (36.7 C)  98.7 F (37.1 C)  Resp: 20 18 20 20   Height:      Weight:    71.4 kg  SpO2: 92% 94%  95%  TempSrc:  Oral  Oral  BMI (Calculated):    23.94    Feels ok, a bit agitated, no CP or SOB Lungs clear Cardiac- RRR, tachy Ext- no edema abd- benign  A/P conts medical stabilization per AHF team, poss OR tomorrow if all agree   , PA-C

## 2020-07-07 ENCOUNTER — Inpatient Hospital Stay (HOSPITAL_COMMUNITY)
Admission: EM | Disposition: A | Discharge status: Home / Self Care | Payer: Self-pay | Source: Home / Self Care | Attending: Thoracic Surgery (Cardiothoracic Vascular Surgery)

## 2020-07-07 ENCOUNTER — Encounter (HOSPITAL_COMMUNITY): Payer: Self-pay | Admitting: Cardiology

## 2020-07-07 ENCOUNTER — Inpatient Hospital Stay (HOSPITAL_COMMUNITY): Payer: 59

## 2020-07-07 ENCOUNTER — Inpatient Hospital Stay (HOSPITAL_COMMUNITY): Payer: 59 | Admitting: Anesthesiology

## 2020-07-07 DIAGNOSIS — I2510 Atherosclerotic heart disease of native coronary artery without angina pectoris: Secondary | ICD-10-CM

## 2020-07-07 DIAGNOSIS — Z951 Presence of aortocoronary bypass graft: Secondary | ICD-10-CM

## 2020-07-07 DIAGNOSIS — I340 Nonrheumatic mitral (valve) insufficiency: Secondary | ICD-10-CM

## 2020-07-07 DIAGNOSIS — I5043 Acute on chronic combined systolic (congestive) and diastolic (congestive) heart failure: Secondary | ICD-10-CM | POA: Diagnosis not present

## 2020-07-07 HISTORY — PX: TEE WITHOUT CARDIOVERSION: SHX5443

## 2020-07-07 HISTORY — PX: CORONARY ARTERY BYPASS GRAFT: SHX141

## 2020-07-07 HISTORY — PX: PLACEMENT OF IMPELLA LEFT VENTRICULAR ASSIST DEVICE: SHX6519

## 2020-07-07 HISTORY — PX: RADIAL ARTERY HARVEST: SHX5067

## 2020-07-07 HISTORY — PX: ENDOVEIN HARVEST OF GREATER SAPHENOUS VEIN: SHX5059

## 2020-07-07 LAB — BLOOD GAS, ARTERIAL
Acid-base deficit: 1.7 mmol/L (ref 0.0–2.0)
Bicarbonate: 21.4 mmol/L (ref 20.0–28.0)
Drawn by: 270271
FIO2: 21
O2 Saturation: 95.2 %
Patient temperature: 37
pCO2 arterial: 29 mmHg — ABNORMAL LOW (ref 32.0–48.0)
pH, Arterial: 7.481 — ABNORMAL HIGH (ref 7.350–7.450)
pO2, Arterial: 76.3 mmHg — ABNORMAL LOW (ref 83.0–108.0)

## 2020-07-07 LAB — POCT I-STAT, CHEM 8
BUN: 13 mg/dL (ref 6–20)
BUN: 14 mg/dL (ref 6–20)
BUN: 16 mg/dL (ref 6–20)
BUN: 13 mg/dL (ref 6–20)
BUN: 14 mg/dL (ref 6–20)
BUN: 14 mg/dL (ref 6–20)
BUN: 15 mg/dL (ref 6–20)
BUN: 15 mg/dL (ref 6–20)
BUN: 14 mg/dL (ref 6–20)
BUN: 15 mg/dL (ref 6–20)
Calcium, Ion: 1.25 mmol/L (ref 1.15–1.40)
Calcium, Ion: 1.22 mmol/L (ref 1.15–1.40)
Calcium, Ion: 1.19 mmol/L (ref 1.15–1.40)
Calcium, Ion: 0.97 mmol/L — ABNORMAL LOW (ref 1.15–1.40)
Calcium, Ion: 1.07 mmol/L — ABNORMAL LOW (ref 1.15–1.40)
Calcium, Ion: 1.06 mmol/L — ABNORMAL LOW (ref 1.15–1.40)
Calcium, Ion: 1.09 mmol/L — ABNORMAL LOW (ref 1.15–1.40)
Calcium, Ion: 1.06 mmol/L — ABNORMAL LOW (ref 1.15–1.40)
Calcium, Ion: 1 mmol/L — ABNORMAL LOW (ref 1.15–1.40)
Calcium, Ion: 0.98 mmol/L — ABNORMAL LOW (ref 1.15–1.40)
Chloride: 106 mmol/L (ref 98–111)
Chloride: 105 mmol/L (ref 98–111)
Chloride: 105 mmol/L (ref 98–111)
Chloride: 103 mmol/L (ref 98–111)
Chloride: 99 mmol/L (ref 98–111)
Chloride: 107 mmol/L (ref 98–111)
Chloride: 110 mmol/L (ref 98–111)
Chloride: 108 mmol/L (ref 98–111)
Chloride: 107 mmol/L (ref 98–111)
Chloride: 108 mmol/L (ref 98–111)
Creatinine, Ser: 0.9 mg/dL (ref 0.61–1.24)
Creatinine, Ser: 1 mg/dL (ref 0.61–1.24)
Creatinine, Ser: 1 mg/dL (ref 0.61–1.24)
Creatinine, Ser: 0.8 mg/dL (ref 0.61–1.24)
Creatinine, Ser: 0.9 mg/dL (ref 0.61–1.24)
Creatinine, Ser: 0.8 mg/dL (ref 0.61–1.24)
Creatinine, Ser: 0.9 mg/dL (ref 0.61–1.24)
Creatinine, Ser: 0.8 mg/dL (ref 0.61–1.24)
Creatinine, Ser: 0.8 mg/dL (ref 0.61–1.24)
Creatinine, Ser: 0.9 mg/dL (ref 0.61–1.24)
Glucose, Bld: 135 mg/dL — ABNORMAL HIGH (ref 70–99)
Glucose, Bld: 206 mg/dL — ABNORMAL HIGH (ref 70–99)
Glucose, Bld: 217 mg/dL — ABNORMAL HIGH (ref 70–99)
Glucose, Bld: 169 mg/dL — ABNORMAL HIGH (ref 70–99)
Glucose, Bld: 179 mg/dL — ABNORMAL HIGH (ref 70–99)
Glucose, Bld: 138 mg/dL — ABNORMAL HIGH (ref 70–99)
Glucose, Bld: 158 mg/dL — ABNORMAL HIGH (ref 70–99)
Glucose, Bld: 161 mg/dL — ABNORMAL HIGH (ref 70–99)
Glucose, Bld: 146 mg/dL — ABNORMAL HIGH (ref 70–99)
Glucose, Bld: 174 mg/dL — ABNORMAL HIGH (ref 70–99)
HCT: 38 % — ABNORMAL LOW (ref 39.0–52.0)
HCT: 38 % — ABNORMAL LOW (ref 39.0–52.0)
HCT: 39 % (ref 39.0–52.0)
HCT: 30 % — ABNORMAL LOW (ref 39.0–52.0)
HCT: 30 % — ABNORMAL LOW (ref 39.0–52.0)
HCT: 28 % — ABNORMAL LOW (ref 39.0–52.0)
HCT: 29 % — ABNORMAL LOW (ref 39.0–52.0)
HCT: 26 % — ABNORMAL LOW (ref 39.0–52.0)
HCT: 23 % — ABNORMAL LOW (ref 39.0–52.0)
HCT: 26 % — ABNORMAL LOW (ref 39.0–52.0)
Hemoglobin: 12.9 g/dL — ABNORMAL LOW (ref 13.0–17.0)
Hemoglobin: 12.9 g/dL — ABNORMAL LOW (ref 13.0–17.0)
Hemoglobin: 13.3 g/dL (ref 13.0–17.0)
Hemoglobin: 10.2 g/dL — ABNORMAL LOW (ref 13.0–17.0)
Hemoglobin: 10.2 g/dL — ABNORMAL LOW (ref 13.0–17.0)
Hemoglobin: 9.5 g/dL — ABNORMAL LOW (ref 13.0–17.0)
Hemoglobin: 9.9 g/dL — ABNORMAL LOW (ref 13.0–17.0)
Hemoglobin: 8.8 g/dL — ABNORMAL LOW (ref 13.0–17.0)
Hemoglobin: 7.8 g/dL — ABNORMAL LOW (ref 13.0–17.0)
Hemoglobin: 8.8 g/dL — ABNORMAL LOW (ref 13.0–17.0)
Potassium: 4.5 mmol/L (ref 3.5–5.1)
Potassium: 4.5 mmol/L (ref 3.5–5.1)
Potassium: 4.5 mmol/L (ref 3.5–5.1)
Potassium: 4.4 mmol/L (ref 3.5–5.1)
Potassium: 5 mmol/L (ref 3.5–5.1)
Potassium: 5 mmol/L (ref 3.5–5.1)
Potassium: 5.8 mmol/L — ABNORMAL HIGH (ref 3.5–5.1)
Potassium: 4.7 mmol/L (ref 3.5–5.1)
Potassium: 4.3 mmol/L (ref 3.5–5.1)
Potassium: 4.2 mmol/L (ref 3.5–5.1)
Sodium: 141 mmol/L (ref 135–145)
Sodium: 139 mmol/L (ref 135–145)
Sodium: 139 mmol/L (ref 135–145)
Sodium: 137 mmol/L (ref 135–145)
Sodium: 137 mmol/L (ref 135–145)
Sodium: 139 mmol/L (ref 135–145)
Sodium: 140 mmol/L (ref 135–145)
Sodium: 141 mmol/L (ref 135–145)
Sodium: 142 mmol/L (ref 135–145)
Sodium: 143 mmol/L (ref 135–145)
TCO2: 24 mmol/L (ref 22–32)
TCO2: 23 mmol/L (ref 22–32)
TCO2: 25 mmol/L (ref 22–32)
TCO2: 23 mmol/L (ref 22–32)
TCO2: 24 mmol/L (ref 22–32)
TCO2: 22 mmol/L (ref 22–32)
TCO2: 24 mmol/L (ref 22–32)
TCO2: 24 mmol/L (ref 22–32)
TCO2: 23 mmol/L (ref 22–32)
TCO2: 21 mmol/L — ABNORMAL LOW (ref 22–32)

## 2020-07-07 LAB — CBC
HCT: 36.2 % — ABNORMAL LOW (ref 39.0–52.0)
HCT: 32.8 % — ABNORMAL LOW (ref 39.0–52.0)
Hemoglobin: 11.2 g/dL — ABNORMAL LOW (ref 13.0–17.0)
Hemoglobin: 10.2 g/dL — ABNORMAL LOW (ref 13.0–17.0)
MCH: 28.3 pg (ref 26.0–34.0)
MCH: 28.7 pg (ref 26.0–34.0)
MCHC: 30.9 g/dL (ref 30.0–36.0)
MCHC: 31.1 g/dL (ref 30.0–36.0)
MCV: 91.4 fL (ref 80.0–100.0)
MCV: 92.4 fL (ref 80.0–100.0)
Platelets: 326 10*3/uL (ref 150–400)
Platelets: 165 10*3/uL (ref 150–400)
RBC: 3.96 MIL/uL — ABNORMAL LOW (ref 4.22–5.81)
RBC: 3.55 MIL/uL — ABNORMAL LOW (ref 4.22–5.81)
RDW: 13.4 % (ref 11.5–15.5)
RDW: 14.3 % (ref 11.5–15.5)
WBC: 14.5 10*3/uL — ABNORMAL HIGH (ref 4.0–10.5)
WBC: 41.2 10*3/uL — ABNORMAL HIGH (ref 4.0–10.5)
nRBC: 0 % (ref 0.0–0.2)
nRBC: 0 % (ref 0.0–0.2)

## 2020-07-07 LAB — POCT I-STAT EG7
Acid-base deficit: 4 mmol/L — ABNORMAL HIGH (ref 0.0–2.0)
Bicarbonate: 24.1 mmol/L (ref 20.0–28.0)
Calcium, Ion: 0.91 mmol/L — ABNORMAL LOW (ref 1.15–1.40)
HCT: 28 % — ABNORMAL LOW (ref 39.0–52.0)
Hemoglobin: 9.5 g/dL — ABNORMAL LOW (ref 13.0–17.0)
O2 Saturation: 78 %
Potassium: 4.5 mmol/L (ref 3.5–5.1)
Sodium: 136 mmol/L (ref 135–145)
TCO2: 26 mmol/L (ref 22–32)
pCO2, Ven: 59.1 mmHg (ref 44.0–60.0)
pH, Ven: 7.218 — ABNORMAL LOW (ref 7.250–7.430)
pO2, Ven: 52 mmHg — ABNORMAL HIGH (ref 32.0–45.0)

## 2020-07-07 LAB — POCT I-STAT 7, (LYTES, BLD GAS, ICA,H+H)
Acid-base deficit: 2 mmol/L (ref 0.0–2.0)
Acid-base deficit: 6 mmol/L — ABNORMAL HIGH (ref 0.0–2.0)
Acid-base deficit: 3 mmol/L — ABNORMAL HIGH (ref 0.0–2.0)
Acid-base deficit: 3 mmol/L — ABNORMAL HIGH (ref 0.0–2.0)
Acid-base deficit: 4 mmol/L — ABNORMAL HIGH (ref 0.0–2.0)
Acid-base deficit: 6 mmol/L — ABNORMAL HIGH (ref 0.0–2.0)
Bicarbonate: 24.4 mmol/L (ref 20.0–28.0)
Bicarbonate: 23.8 mmol/L (ref 20.0–28.0)
Bicarbonate: 24.8 mmol/L (ref 20.0–28.0)
Bicarbonate: 22.4 mmol/L (ref 20.0–28.0)
Bicarbonate: 22.4 mmol/L (ref 20.0–28.0)
Bicarbonate: 22.7 mmol/L (ref 20.0–28.0)
Calcium, Ion: 1.24 mmol/L (ref 1.15–1.40)
Calcium, Ion: 1.21 mmol/L (ref 1.15–1.40)
Calcium, Ion: 0.99 mmol/L — ABNORMAL LOW (ref 1.15–1.40)
Calcium, Ion: 1.04 mmol/L — ABNORMAL LOW (ref 1.15–1.40)
Calcium, Ion: 0.9 mmol/L — ABNORMAL LOW (ref 1.15–1.40)
Calcium, Ion: 0.98 mmol/L — ABNORMAL LOW (ref 1.15–1.40)
HCT: 36 % — ABNORMAL LOW (ref 39.0–52.0)
HCT: 38 % — ABNORMAL LOW (ref 39.0–52.0)
HCT: 30 % — ABNORMAL LOW (ref 39.0–52.0)
HCT: 25 % — ABNORMAL LOW (ref 39.0–52.0)
HCT: 24 % — ABNORMAL LOW (ref 39.0–52.0)
HCT: 26 % — ABNORMAL LOW (ref 39.0–52.0)
Hemoglobin: 12.2 g/dL — ABNORMAL LOW (ref 13.0–17.0)
Hemoglobin: 12.9 g/dL — ABNORMAL LOW (ref 13.0–17.0)
Hemoglobin: 10.2 g/dL — ABNORMAL LOW (ref 13.0–17.0)
Hemoglobin: 8.5 g/dL — ABNORMAL LOW (ref 13.0–17.0)
Hemoglobin: 8.2 g/dL — ABNORMAL LOW (ref 13.0–17.0)
Hemoglobin: 8.8 g/dL — ABNORMAL LOW (ref 13.0–17.0)
O2 Saturation: 96 %
O2 Saturation: 87 %
O2 Saturation: 100 %
O2 Saturation: 100 %
O2 Saturation: 98 %
O2 Saturation: 99 %
Patient temperature: 37
Potassium: 4.6 mmol/L (ref 3.5–5.1)
Potassium: 4.5 mmol/L (ref 3.5–5.1)
Potassium: 4.4 mmol/L (ref 3.5–5.1)
Potassium: 5.8 mmol/L — ABNORMAL HIGH (ref 3.5–5.1)
Potassium: 4.2 mmol/L (ref 3.5–5.1)
Potassium: 4.4 mmol/L (ref 3.5–5.1)
Sodium: 141 mmol/L (ref 135–145)
Sodium: 140 mmol/L (ref 135–145)
Sodium: 137 mmol/L (ref 135–145)
Sodium: 141 mmol/L (ref 135–145)
Sodium: 143 mmol/L (ref 135–145)
Sodium: 145 mmol/L (ref 135–145)
TCO2: 26 mmol/L (ref 22–32)
TCO2: 26 mmol/L (ref 22–32)
TCO2: 26 mmol/L (ref 22–32)
TCO2: 24 mmol/L (ref 22–32)
TCO2: 24 mmol/L (ref 22–32)
TCO2: 24 mmol/L (ref 22–32)
pCO2 arterial: 47.6 mmHg (ref 32.0–48.0)
pCO2 arterial: 63.7 mmHg — ABNORMAL HIGH (ref 32.0–48.0)
pCO2 arterial: 55.2 mmHg — ABNORMAL HIGH (ref 32.0–48.0)
pCO2 arterial: 39.9 mmHg (ref 32.0–48.0)
pCO2 arterial: 48.5 mmHg — ABNORMAL HIGH (ref 32.0–48.0)
pCO2 arterial: 60.2 mmHg — ABNORMAL HIGH (ref 32.0–48.0)
pH, Arterial: 7.318 — ABNORMAL LOW (ref 7.350–7.450)
pH, Arterial: 7.181 — CL (ref 7.350–7.450)
pH, Arterial: 7.262 — ABNORMAL LOW (ref 7.350–7.450)
pH, Arterial: 7.357 (ref 7.350–7.450)
pH, Arterial: 7.178 — CL (ref 7.350–7.450)
pH, Arterial: 7.278 — ABNORMAL LOW (ref 7.350–7.450)
pO2, Arterial: 92 mmHg (ref 83.0–108.0)
pO2, Arterial: 67 mmHg — ABNORMAL LOW (ref 83.0–108.0)
pO2, Arterial: 308 mmHg — ABNORMAL HIGH (ref 83.0–108.0)
pO2, Arterial: 281 mmHg — ABNORMAL HIGH (ref 83.0–108.0)
pO2, Arterial: 128 mmHg — ABNORMAL HIGH (ref 83.0–108.0)
pO2, Arterial: 153 mmHg — ABNORMAL HIGH (ref 83.0–108.0)

## 2020-07-07 LAB — HEPARIN LEVEL (UNFRACTIONATED): Heparin Unfractionated: 0.71 IU/mL — ABNORMAL HIGH (ref 0.30–0.70)

## 2020-07-07 LAB — HEMOGLOBIN AND HEMATOCRIT, BLOOD
HCT: 25.8 % — ABNORMAL LOW (ref 39.0–52.0)
Hemoglobin: 7.8 g/dL — ABNORMAL LOW (ref 13.0–17.0)

## 2020-07-07 LAB — COMPREHENSIVE METABOLIC PANEL
ALT: 43 U/L (ref 0–44)
AST: 36 U/L (ref 15–41)
Albumin: 2.7 g/dL — ABNORMAL LOW (ref 3.5–5.0)
Alkaline Phosphatase: 55 U/L (ref 38–126)
Anion gap: 10 (ref 5–15)
BUN: 14 mg/dL (ref 6–20)
CO2: 22 mmol/L (ref 22–32)
Calcium: 8.7 mg/dL — ABNORMAL LOW (ref 8.9–10.3)
Chloride: 106 mmol/L (ref 98–111)
Creatinine, Ser: 0.98 mg/dL (ref 0.61–1.24)
GFR, Estimated: >60 mL/min (ref >60)
Glucose, Bld: 127 mg/dL — ABNORMAL HIGH (ref 70–99)
Potassium: 3.7 mmol/L (ref 3.5–5.1)
Sodium: 138 mmol/L (ref 135–145)
Total Bilirubin: 0.5 mg/dL (ref 0.3–1.2)
Total Protein: 6.3 g/dL — ABNORMAL LOW (ref 6.5–8.1)

## 2020-07-07 LAB — URINALYSIS, ROUTINE W REFLEX MICROSCOPIC
Bilirubin Urine: NEGATIVE
Glucose, UA: NEGATIVE mg/dL
Hgb urine dipstick: NEGATIVE
Ketones, ur: NEGATIVE mg/dL
Leukocytes,Ua: NEGATIVE
Nitrite: NEGATIVE
Protein, ur: NEGATIVE mg/dL
Specific Gravity, Urine: 1.015 (ref 1.005–1.030)
pH: 6 (ref 5.0–8.0)

## 2020-07-07 LAB — GLUCOSE, CAPILLARY
Glucose-Capillary: 111 mg/dL — ABNORMAL HIGH (ref 70–99)
Glucose-Capillary: 102 mg/dL — ABNORMAL HIGH (ref 70–99)
Glucose-Capillary: 159 mg/dL — ABNORMAL HIGH (ref 70–99)
Glucose-Capillary: 177 mg/dL — ABNORMAL HIGH (ref 70–99)
Glucose-Capillary: 167 mg/dL — ABNORMAL HIGH (ref 70–99)
Glucose-Capillary: 172 mg/dL — ABNORMAL HIGH (ref 70–99)
Glucose-Capillary: 163 mg/dL — ABNORMAL HIGH (ref 70–99)

## 2020-07-07 LAB — HEMOGLOBIN A1C
Hgb A1c MFr Bld: 5.4 % (ref 4.8–5.6)
Mean Plasma Glucose: 108.28 mg/dL

## 2020-07-07 LAB — SURGICAL PCR SCREEN
MRSA, PCR: NEGATIVE
Staphylococcus aureus: NEGATIVE

## 2020-07-07 LAB — PROTIME-INR
INR: 1.3 — ABNORMAL HIGH (ref 0.8–1.2)
INR: 2.1 — ABNORMAL HIGH (ref 0.8–1.2)
Prothrombin Time: 15.5 seconds — ABNORMAL HIGH (ref 11.4–15.2)
Prothrombin Time: 22.5 seconds — ABNORMAL HIGH (ref 11.4–15.2)

## 2020-07-07 LAB — COOXEMETRY PANEL
Carboxyhemoglobin: 1 % (ref 0.5–1.5)
Methemoglobin: 0.7 % (ref 0.0–1.5)
O2 Saturation: 60.1 %
Total hemoglobin: 12 g/dL (ref 12.0–16.0)

## 2020-07-07 LAB — APTT
aPTT: 133 seconds — ABNORMAL HIGH (ref 24–36)
aPTT: 52 seconds — ABNORMAL HIGH (ref 24–36)

## 2020-07-07 LAB — MAGNESIUM: Magnesium: 2.2 mg/dL (ref 1.7–2.4)

## 2020-07-07 LAB — PLATELET COUNT: Platelets: 226 10*3/uL (ref 150–400)

## 2020-07-07 LAB — PREPARE RBC (CROSSMATCH)

## 2020-07-07 SURGERY — CORONARY ARTERY BYPASS GRAFTING (CABG)
Anesthesia: General | Site: Leg Upper | Laterality: Right

## 2020-07-07 MED ORDER — ONDANSETRON HCL 4 MG/2ML IJ SOLN
4.0000 mg | Freq: Four times a day (QID) | INTRAMUSCULAR | Status: DC | PRN
  Administered 2020-07-14: 4 mg via INTRAVENOUS
  Filled 2020-07-07: qty 2

## 2020-07-07 MED ORDER — INSULIN REGULAR(HUMAN) IN NACL 100-0.9 UT/100ML-% IV SOLN
INTRAVENOUS | Status: DC
  Administered 2020-07-08: 4.8 [IU]/h via INTRAVENOUS
  Filled 2020-07-07: qty 100

## 2020-07-07 MED ORDER — ONDANSETRON HCL 4 MG/2ML IJ SOLN
INTRAMUSCULAR | Status: AC
  Filled 2020-07-07: qty 2

## 2020-07-07 MED ORDER — METOPROLOL TARTRATE 12.5 MG HALF TABLET: 12.5000 mg | ORAL_TABLET | Freq: Two times a day (BID) | ORAL | Status: DC

## 2020-07-07 MED ORDER — VASOPRESSIN 20 UNITS/100 ML INFUSION FOR SHOCK
INTRAVENOUS | Status: DC | PRN
  Administered 2020-07-07: .3 [IU]/h via INTRAVENOUS

## 2020-07-07 MED ORDER — EPINEPHRINE HCL 5 MG/250ML IV SOLN IN NS
0.5000 ug/min | INTRAVENOUS | Status: DC
  Administered 2020-07-08: 11:00:00 20 ug/min via INTRAVENOUS
  Administered 2020-07-08: 12 ug/min via INTRAVENOUS
  Administered 2020-07-08: 07:00:00 20 ug/min via INTRAVENOUS
  Administered 2020-07-08: 14:00:00 16 ug/min via INTRAVENOUS
  Administered 2020-07-09 – 2020-07-10 (×2): 3.013 ug/min via INTRAVENOUS
  Administered 2020-07-12: 0.5 ug/min via INTRAVENOUS
  Filled 2020-07-07: qty 500
  Filled 2020-07-07 (×5): qty 250

## 2020-07-07 MED ORDER — PROTAMINE SULFATE 10 MG/ML IV SOLN
INTRAVENOUS | Status: AC
  Filled 2020-07-07: qty 5

## 2020-07-07 MED ORDER — PHENYLEPHRINE 40 MCG/ML (10ML) SYRINGE FOR IV PUSH (FOR BLOOD PRESSURE SUPPORT)
PREFILLED_SYRINGE | INTRAVENOUS | Status: AC
  Filled 2020-07-07: qty 10

## 2020-07-07 MED ORDER — ROCURONIUM BROMIDE 10 MG/ML (PF) SYRINGE
PREFILLED_SYRINGE | INTRAVENOUS | Status: DC | PRN
  Administered 2020-07-07: 70 mg via INTRAVENOUS
  Administered 2020-07-07: 30 mg via INTRAVENOUS
  Administered 2020-07-07: 100 mg via INTRAVENOUS
  Administered 2020-07-07 (×4): 50 mg via INTRAVENOUS

## 2020-07-07 MED ORDER — LACTATED RINGERS IV SOLN: INTRAVENOUS | Status: DC | PRN

## 2020-07-07 MED ORDER — MIDAZOLAM HCL 2 MG/2ML IJ SOLN
2.0000 mg | INTRAMUSCULAR | Status: DC | PRN
  Administered 2020-07-08 – 2020-07-13 (×9): 2 mg via INTRAVENOUS
  Filled 2020-07-07 (×10): qty 2

## 2020-07-07 MED ORDER — ROCURONIUM BROMIDE 10 MG/ML (PF) SYRINGE
PREFILLED_SYRINGE | INTRAVENOUS | Status: AC
  Filled 2020-07-07: qty 20

## 2020-07-07 MED ORDER — SODIUM CHLORIDE 0.9% IV SOLUTION: Freq: Once | INTRAVENOUS | Status: DC

## 2020-07-07 MED ORDER — FAMOTIDINE IN NACL 20-0.9 MG/50ML-% IV SOLN
20.0000 mg | Freq: Two times a day (BID) | INTRAVENOUS | Status: AC
  Administered 2020-07-07 (×2): 20 mg via INTRAVENOUS
  Filled 2020-07-07: qty 50

## 2020-07-07 MED ORDER — PLASMA-LYTE 148 IV SOLN
INTRAVENOUS | Status: DC | PRN
  Administered 2020-07-07: 500 mL via INTRAVASCULAR

## 2020-07-07 MED ORDER — MIDAZOLAM HCL 5 MG/5ML IJ SOLN
INTRAMUSCULAR | Status: DC | PRN
  Administered 2020-07-07: 2 mg via INTRAVENOUS
  Administered 2020-07-07: 1 mg via INTRAVENOUS
  Administered 2020-07-07: 2 mg via INTRAVENOUS
  Administered 2020-07-07: 1 mg via INTRAVENOUS
  Administered 2020-07-07: 4 mg via INTRAVENOUS
  Administered 2020-07-07: 2 mg via INTRAVENOUS

## 2020-07-07 MED ORDER — HEPARIN SODIUM (PORCINE) 5000 UNIT/ML IJ SOLN: 25000.0000 [IU] | INTRAVENOUS | Status: DC

## 2020-07-07 MED ORDER — MAGNESIUM SULFATE 4 GM/100ML IV SOLN
4.0000 g | Freq: Once | INTRAVENOUS | Status: AC
  Administered 2020-07-07: 4 g via INTRAVENOUS
  Filled 2020-07-07: qty 100

## 2020-07-07 MED ORDER — PHENYLEPHRINE HCL-NACL 20-0.9 MG/250ML-% IV SOLN
0.0000 ug/min | INTRAVENOUS | Status: DC
  Administered 2020-07-08 (×2): 100 ug/min via INTRAVENOUS
  Filled 2020-07-07 (×3): qty 250

## 2020-07-07 MED ORDER — PROTAMINE SULFATE 10 MG/ML IV SOLN: INTRAVENOUS | Status: DC | PRN

## 2020-07-07 MED ORDER — LACTATED RINGERS IV SOLN: INTRAVENOUS | Status: DC

## 2020-07-07 MED ORDER — POTASSIUM CHLORIDE 10 MEQ/50ML IV SOLN: 10.0000 meq | INTRAVENOUS | Status: AC

## 2020-07-07 MED ORDER — HEPARIN SODIUM (PORCINE) 1000 UNIT/ML IJ SOLN
INTRAMUSCULAR | Status: DC | PRN
  Administered 2020-07-07: 2000 [IU] via INTRAVENOUS
  Administered 2020-07-07: 7000 [IU] via INTRAVENOUS
  Administered 2020-07-07: 23000 [IU] via INTRAVENOUS

## 2020-07-07 MED ORDER — ACETAMINOPHEN 160 MG/5ML PO SOLN: 650.0000 mg | Freq: Once | ORAL | Status: AC

## 2020-07-07 MED ORDER — SODIUM CHLORIDE 0.9% FLUSH
3.0000 mL | Freq: Two times a day (BID) | INTRAVENOUS | Status: DC
  Administered 2020-07-09 – 2020-07-16 (×7): 3 mL via INTRAVENOUS

## 2020-07-07 MED ORDER — CHLORHEXIDINE GLUCONATE 0.12 % MT SOLN: 15.0000 mL | OROMUCOSAL | Status: DC

## 2020-07-07 MED ORDER — PROPOFOL 10 MG/ML IV BOLUS
INTRAVENOUS | Status: AC
  Filled 2020-07-07: qty 20

## 2020-07-07 MED ORDER — FENTANYL CITRATE (PF) 250 MCG/5ML IJ SOLN
INTRAMUSCULAR | Status: AC
  Filled 2020-07-07: qty 25

## 2020-07-07 MED ORDER — VASOPRESSIN 20 UNITS/100 ML INFUSION FOR SHOCK
0.0000 [IU]/min | INTRAVENOUS | Status: DC
  Filled 2020-07-07: qty 100

## 2020-07-07 MED ORDER — ROCURONIUM BROMIDE 10 MG/ML (PF) SYRINGE
PREFILLED_SYRINGE | INTRAVENOUS | Status: AC
  Filled 2020-07-07: qty 10

## 2020-07-07 MED ORDER — EPINEPHRINE HCL 5 MG/250ML IV SOLN IN NS
INTRAVENOUS | Status: DC | PRN
  Administered 2020-07-07: 3 ug/min via INTRAVENOUS

## 2020-07-07 MED ORDER — BISACODYL 5 MG PO TBEC
10.0000 mg | DELAYED_RELEASE_TABLET | Freq: Every day | ORAL | Status: DC
  Administered 2020-07-08 – 2020-07-16 (×3): 10 mg via ORAL
  Filled 2020-07-07 (×5): qty 2

## 2020-07-07 MED ORDER — SODIUM CHLORIDE 0.9 % IV SOLN
1.5000 g | Freq: Two times a day (BID) | INTRAVENOUS | Status: DC
  Administered 2020-07-08 – 2020-07-09 (×3): 1.5 g via INTRAVENOUS
  Filled 2020-07-07 (×3): qty 1.5

## 2020-07-07 MED ORDER — METOPROLOL TARTRATE 25 MG/10 ML ORAL SUSPENSION
12.5000 mg | Freq: Two times a day (BID) | ORAL | Status: DC
  Administered 2020-07-09: 12.5 mg
  Filled 2020-07-07: qty 5

## 2020-07-07 MED ORDER — ASPIRIN 81 MG PO CHEW
324.0000 mg | CHEWABLE_TABLET | Freq: Every day | ORAL | Status: DC
  Administered 2020-07-08 – 2020-07-14 (×7): 324 mg
  Filled 2020-07-07 (×7): qty 4

## 2020-07-07 MED ORDER — ACETAMINOPHEN 500 MG PO TABS: 1000.0000 mg | ORAL_TABLET | Freq: Four times a day (QID) | ORAL | Status: DC

## 2020-07-07 MED ORDER — ASPIRIN EC 325 MG PO TBEC
325.0000 mg | DELAYED_RELEASE_TABLET | Freq: Every day | ORAL | Status: DC
  Administered 2020-07-15 – 2020-07-17 (×3): 325 mg via ORAL
  Filled 2020-07-07 (×5): qty 1

## 2020-07-07 MED ORDER — ACETAMINOPHEN 160 MG/5ML PO SOLN
1000.0000 mg | Freq: Four times a day (QID) | ORAL | Status: DC
  Administered 2020-07-07 – 2020-07-09 (×4): 1000 mg
  Filled 2020-07-07 (×4): qty 40.6

## 2020-07-07 MED ORDER — AMIODARONE HCL IN DEXTROSE 360-4.14 MG/200ML-% IV SOLN
30.0000 mg/h | INTRAVENOUS | Status: DC
  Administered 2020-07-07 – 2020-07-16 (×17): 30 mg/h via INTRAVENOUS
  Filled 2020-07-07 (×19): qty 200

## 2020-07-07 MED ORDER — LIDOCAINE 2% (20 MG/ML) 5 ML SYRINGE
INTRAMUSCULAR | Status: DC | PRN
  Administered 2020-07-07: 100 mg via INTRAVENOUS

## 2020-07-07 MED ORDER — HEPARIN SODIUM (PORCINE) 5000 UNIT/ML IJ SOLN
25000.0000 [IU] | INTRAVENOUS | Status: DC
  Filled 2020-07-07: qty 5

## 2020-07-07 MED ORDER — LORAZEPAM 2 MG/ML IJ SOLN
1.0000 mg | Freq: Four times a day (QID) | INTRAMUSCULAR | Status: DC | PRN
  Administered 2020-07-08 – 2020-07-14 (×4): 1 mg via INTRAVENOUS
  Filled 2020-07-07 (×5): qty 1

## 2020-07-07 MED ORDER — NITROGLYCERIN IN D5W 200-5 MCG/ML-% IV SOLN: 0.0000 ug/min | INTRAVENOUS | Status: DC

## 2020-07-07 MED ORDER — EPHEDRINE 5 MG/ML INJ
INTRAVENOUS | Status: AC
  Filled 2020-07-07: qty 10

## 2020-07-07 MED ORDER — TRAMADOL HCL 50 MG PO TABS
50.0000 mg | ORAL_TABLET | ORAL | Status: DC | PRN
  Administered 2020-07-08: 100 mg via ORAL
  Filled 2020-07-07: qty 2

## 2020-07-07 MED ORDER — 0.9 % SODIUM CHLORIDE (POUR BTL) OPTIME
TOPICAL | Status: DC | PRN
  Administered 2020-07-07: 6000 mL
  Administered 2020-07-07: 1000 mL

## 2020-07-07 MED ORDER — SUCCINYLCHOLINE CHLORIDE 200 MG/10ML IV SOSY
PREFILLED_SYRINGE | INTRAVENOUS | Status: DC | PRN
  Administered 2020-07-07: 120 mg via INTRAVENOUS

## 2020-07-07 MED ORDER — SODIUM CHLORIDE 0.9 % IV SOLN: INTRAVENOUS | Status: DC

## 2020-07-07 MED ORDER — SODIUM CHLORIDE 0.9% FLUSH: 3.0000 mL | INTRAVENOUS | Status: DC | PRN

## 2020-07-07 MED ORDER — ASPIRIN 325 MG PO TABS
325.0000 mg | ORAL_TABLET | Freq: Every day | ORAL | Status: DC
  Filled 2020-07-07: qty 1

## 2020-07-07 MED ORDER — MIDAZOLAM HCL 2 MG/2ML IJ SOLN
INTRAMUSCULAR | Status: AC
  Filled 2020-07-07: qty 2

## 2020-07-07 MED ORDER — CHLORHEXIDINE GLUCONATE CLOTH 2 % EX PADS
6.0000 | MEDICATED_PAD | Freq: Every day | CUTANEOUS | Status: DC
  Administered 2020-07-07 – 2020-07-17 (×12): 6 via TOPICAL

## 2020-07-07 MED ORDER — LIDOCAINE 2% (20 MG/ML) 5 ML SYRINGE
INTRAMUSCULAR | Status: AC
  Filled 2020-07-07: qty 5

## 2020-07-07 MED ORDER — LACTATED RINGERS IV SOLN: 500.0000 mL | Freq: Once | INTRAVENOUS | Status: DC | PRN

## 2020-07-07 MED ORDER — ACETAMINOPHEN 650 MG RE SUPP
650.0000 mg | Freq: Once | RECTAL | Status: AC
  Administered 2020-07-07: 650 mg via RECTAL

## 2020-07-07 MED ORDER — OXYCODONE HCL 5 MG PO TABS: 5.0000 mg | ORAL_TABLET | ORAL | Status: DC | PRN

## 2020-07-07 MED ORDER — PROTAMINE SULFATE 10 MG/ML IV SOLN
INTRAVENOUS | Status: DC | PRN
  Administered 2020-07-07: 230 mg via INTRAVENOUS
  Administered 2020-07-07: 20 mg via INTRAVENOUS

## 2020-07-07 MED ORDER — ALBUMIN HUMAN 5 % IV SOLN
250.0000 mL | INTRAVENOUS | Status: AC | PRN
  Administered 2020-07-07 (×4): 12.5 g via INTRAVENOUS
  Filled 2020-07-07: qty 250

## 2020-07-07 MED ORDER — MIDAZOLAM HCL (PF) 10 MG/2ML IJ SOLN
INTRAMUSCULAR | Status: AC
  Filled 2020-07-07: qty 2

## 2020-07-07 MED ORDER — VANCOMYCIN HCL IN DEXTROSE 1-5 GM/200ML-% IV SOLN
1000.0000 mg | Freq: Once | INTRAVENOUS | Status: DC
  Filled 2020-07-07: qty 200

## 2020-07-07 MED ORDER — HEPARIN SODIUM (PORCINE) 1000 UNIT/ML IJ SOLN
INTRAMUSCULAR | Status: AC
  Filled 2020-07-07: qty 1

## 2020-07-07 MED ORDER — ALBUMIN HUMAN 5 % IV SOLN: INTRAVENOUS | Status: DC | PRN

## 2020-07-07 MED ORDER — MILRINONE LACTATE IN DEXTROSE 20-5 MG/100ML-% IV SOLN
0.3000 ug/kg/min | INTRAVENOUS | Status: DC
  Administered 2020-07-07 – 2020-07-08 (×2): 0.3 ug/kg/min via INTRAVENOUS
  Filled 2020-07-07 (×2): qty 100
  Filled 2020-07-07: qty 200
  Filled 2020-07-07: qty 100

## 2020-07-07 MED ORDER — SODIUM CHLORIDE 0.9% FLUSH
10.0000 mL | Freq: Two times a day (BID) | INTRAVENOUS | Status: DC
  Administered 2020-07-08 – 2020-07-09 (×2): 10 mL
  Administered 2020-07-09: 40 mL
  Administered 2020-07-10: 10 mL
  Administered 2020-07-10: 20 mL
  Administered 2020-07-11: 40 mL
  Administered 2020-07-11 – 2020-07-13 (×5): 10 mL
  Administered 2020-07-14: 20 mL
  Administered 2020-07-14 – 2020-07-15 (×2): 10 mL
  Administered 2020-07-15: 30 mL
  Administered 2020-07-16 – 2020-07-17 (×2): 10 mL

## 2020-07-07 MED ORDER — PROTAMINE SULFATE 10 MG/ML IV SOLN
INTRAVENOUS | Status: AC
  Filled 2020-07-07: qty 25

## 2020-07-07 MED ORDER — DOCUSATE SODIUM 100 MG PO CAPS: 200.0000 mg | ORAL_CAPSULE | Freq: Every day | ORAL | Status: DC

## 2020-07-07 MED ORDER — CALCIUM CHLORIDE 10 % IV SOLN
INTRAVENOUS | Status: AC
  Filled 2020-07-07: qty 10

## 2020-07-07 MED ORDER — SODIUM CHLORIDE 0.45 % IV SOLN: INTRAVENOUS | Status: DC | PRN

## 2020-07-07 MED ORDER — SODIUM CHLORIDE (PF) 0.9 % IJ SOLN
OROMUCOSAL | Status: DC | PRN
  Administered 2020-07-07 (×3): 4 mL via TOPICAL

## 2020-07-07 MED ORDER — DEXTROSE 50 % IV SOLN: 0.0000 mL | INTRAVENOUS | Status: DC | PRN

## 2020-07-07 MED ORDER — PHENYLEPHRINE 40 MCG/ML (10ML) SYRINGE FOR IV PUSH (FOR BLOOD PRESSURE SUPPORT)
PREFILLED_SYRINGE | INTRAVENOUS | Status: DC | PRN
  Administered 2020-07-07 (×5): 80 ug via INTRAVENOUS

## 2020-07-07 MED ORDER — SODIUM CHLORIDE 0.9% FLUSH
10.0000 mL | INTRAVENOUS | Status: DC | PRN
  Administered 2020-07-15: 20 mL

## 2020-07-07 MED ORDER — CHLORHEXIDINE GLUCONATE 0.12% ORAL RINSE (MEDLINE KIT)
15.0000 mL | Freq: Two times a day (BID) | OROMUCOSAL | Status: DC
  Administered 2020-07-07 – 2020-07-13 (×12): 15 mL via OROMUCOSAL

## 2020-07-07 MED ORDER — PANTOPRAZOLE SODIUM 40 MG PO TBEC: 40.0000 mg | DELAYED_RELEASE_TABLET | Freq: Every day | ORAL | Status: DC

## 2020-07-07 MED ORDER — FENTANYL CITRATE (PF) 250 MCG/5ML IJ SOLN
INTRAMUSCULAR | Status: DC | PRN
Start: 2020-07-07 — End: 2020-07-07
  Administered 2020-07-07: 50 ug via INTRAVENOUS
  Administered 2020-07-07: 150 ug via INTRAVENOUS
  Administered 2020-07-07 (×2): 100 ug via INTRAVENOUS
  Administered 2020-07-07: 50 ug via INTRAVENOUS
  Administered 2020-07-07 (×2): 100 ug via INTRAVENOUS
  Administered 2020-07-07 (×2): 125 ug via INTRAVENOUS
  Administered 2020-07-07: 100 ug via INTRAVENOUS
  Administered 2020-07-07: 150 ug via INTRAVENOUS
  Administered 2020-07-07 (×2): 50 ug via INTRAVENOUS

## 2020-07-07 MED ORDER — PROPOFOL 10 MG/ML IV BOLUS
INTRAVENOUS | Status: DC | PRN
  Administered 2020-07-07 (×2): 10 mg via INTRAVENOUS

## 2020-07-07 MED ORDER — BISACODYL 10 MG RE SUPP
10.0000 mg | Freq: Every day | RECTAL | Status: DC
  Administered 2020-07-09 – 2020-07-14 (×5): 10 mg via RECTAL
  Filled 2020-07-07 (×5): qty 1

## 2020-07-07 MED ORDER — HEMOSTATIC AGENTS (NO CHARGE) OPTIME
TOPICAL | Status: DC | PRN
  Administered 2020-07-07 (×4): 1 via TOPICAL

## 2020-07-07 MED ORDER — SODIUM BICARBONATE 8.4 % IV SOLN
INTRAVENOUS | Status: DC | PRN
  Administered 2020-07-07: 50 meq via INTRAVENOUS

## 2020-07-07 MED ORDER — MORPHINE SULFATE (PF) 2 MG/ML IV SOLN
1.0000 mg | INTRAVENOUS | Status: DC | PRN
  Administered 2020-07-07: 2 mg via INTRAVENOUS
  Administered 2020-07-07: 4 mg via INTRAVENOUS
  Administered 2020-07-08 – 2020-07-09 (×2): 2 mg via INTRAVENOUS
  Administered 2020-07-11 – 2020-07-12 (×3): 4 mg via INTRAVENOUS
  Administered 2020-07-13: 2 mg via INTRAVENOUS
  Administered 2020-07-13: 4 mg via INTRAVENOUS
  Administered 2020-07-13 (×3): 2 mg via INTRAVENOUS
  Filled 2020-07-07: qty 2
  Filled 2020-07-07: qty 1
  Filled 2020-07-07 (×3): qty 2
  Filled 2020-07-07 (×3): qty 1
  Filled 2020-07-07: qty 2
  Filled 2020-07-07: qty 1
  Filled 2020-07-07: qty 2

## 2020-07-07 MED ORDER — SODIUM CHLORIDE 0.9 % IV SOLN: 250.0000 mL | INTRAVENOUS | Status: DC

## 2020-07-07 MED ORDER — DEXMEDETOMIDINE HCL IN NACL 400 MCG/100ML IV SOLN
0.4000 ug/kg/h | INTRAVENOUS | Status: DC
  Administered 2020-07-07: 0.7 ug/kg/h via INTRAVENOUS
  Administered 2020-07-08 (×2): 1.2 ug/kg/h via INTRAVENOUS
  Administered 2020-07-08: 0.7 ug/kg/h via INTRAVENOUS
  Administered 2020-07-09 (×2): 1 ug/kg/h via INTRAVENOUS
  Administered 2020-07-09 – 2020-07-12 (×10): 1.2 ug/kg/h via INTRAVENOUS
  Administered 2020-07-12 – 2020-07-13 (×4): 1 ug/kg/h via INTRAVENOUS
  Administered 2020-07-13: 0.6 ug/kg/h via INTRAVENOUS
  Administered 2020-07-14 (×2): 0.9 ug/kg/h via INTRAVENOUS
  Filled 2020-07-07 (×5): qty 100
  Filled 2020-07-07: qty 200
  Filled 2020-07-07 (×4): qty 100
  Filled 2020-07-07: qty 200
  Filled 2020-07-07 (×4): qty 100
  Filled 2020-07-07: qty 200
  Filled 2020-07-07 (×4): qty 100
  Filled 2020-07-07: qty 200

## 2020-07-07 MED ORDER — METOPROLOL TARTRATE 5 MG/5ML IV SOLN: 2.5000 mg | INTRAVENOUS | Status: DC | PRN

## 2020-07-07 MED ORDER — HEPARIN SODIUM (PORCINE) 5000 UNIT/ML IJ SOLN
25000.0000 [IU] | INTRAVENOUS | Status: DC
  Administered 2020-07-08 – 2020-07-11 (×3): 25000 [IU]
  Filled 2020-07-07 (×6): qty 5

## 2020-07-07 MED ORDER — CALCIUM CHLORIDE 10 % IV SOLN
INTRAVENOUS | Status: DC | PRN
  Administered 2020-07-07 (×10): 100 mg via INTRAVENOUS

## 2020-07-07 MED ORDER — ORAL CARE MOUTH RINSE
15.0000 mL | OROMUCOSAL | Status: DC
  Administered 2020-07-07 – 2020-07-13 (×52): 15 mL via OROMUCOSAL

## 2020-07-07 SURGICAL SUPPLY — 165 items
ADAPTER CARDIO PERF ANTE/RETRO (ADAPTER) ×1 IMPLANT
ADH SKN CLS LQ APL DERMABOND (GAUZE/BANDAGES/DRESSINGS) ×5
ADPR PRFSN 84XANTGRD RTRGD (ADAPTER) ×5
ADPR TBG 2 MALE LL ART (MISCELLANEOUS) ×5
ANCHOR CATH FOLEY SECURE (MISCELLANEOUS) ×3 IMPLANT
APPLIER CLIP 9.375 SM OPEN (CLIP)
APR CLP SM 9.3 20 MLT OPN (CLIP)
BAG DECANTER FOR FLEXI CONT (MISCELLANEOUS) ×6 IMPLANT
BLADE CLIPPER SURG (BLADE) ×6 IMPLANT
BLADE STERNUM SYSTEM 6 (BLADE) ×6 IMPLANT
BLADE SURG 10 STRL SS (BLADE) ×1 IMPLANT
BLADE SURG 15 STRL LF DISP TIS (BLADE) IMPLANT
BLADE SURG 15 STRL SS (BLADE) ×6
BNDG CMPR MED 10X6 ELC LF (GAUZE/BANDAGES/DRESSINGS) ×5
BNDG ELASTIC 4X5.8 VLCR STR LF (GAUZE/BANDAGES/DRESSINGS) ×7 IMPLANT
BNDG ELASTIC 6X10 VLCR STRL LF (GAUZE/BANDAGES/DRESSINGS) ×1 IMPLANT
BNDG ELASTIC 6X5.8 VLCR STR LF (GAUZE/BANDAGES/DRESSINGS) ×6 IMPLANT
BNDG GAUZE ELAST 4 BULKY (GAUZE/BANDAGES/DRESSINGS) ×8 IMPLANT
CANISTER SUCT 3000ML PPV (MISCELLANEOUS) ×6 IMPLANT
CANN PRFSN 3/8XRT ANG TPR 14 (MISCELLANEOUS) ×5
CANNULA EZ GLIDE AORTIC 21FR (CANNULA) ×6 IMPLANT
CANNULA GUNDRY RCSP 15FR (MISCELLANEOUS) ×1 IMPLANT
CANNULA PRFSN 3/8XRT ANG TPR14 (MISCELLANEOUS) IMPLANT
CANNULA SUMP PERICARDIAL (CANNULA) ×1 IMPLANT
CANNULA VEN MTL TIP RT (MISCELLANEOUS) ×6
CANNULA VRC MALB SNGL STG 36FR (MISCELLANEOUS) IMPLANT
CATH ACCU-VU SIZ PIG 5F 100CM (CATHETERS) ×1 IMPLANT
CATH CPB KIT HENDRICKSON (MISCELLANEOUS) ×6 IMPLANT
CATH ROBINSON RED A/P 18FR (CATHETERS) ×9 IMPLANT
CATH THORACIC 36FR (CATHETERS) ×6 IMPLANT
CATH THORACIC 36FR RT ANG (CATHETERS) ×6 IMPLANT
CLIP APPLIE 9.375 SM OPEN (CLIP) IMPLANT
CLIP VESOCCLUDE MED 24/CT (CLIP) ×1 IMPLANT
CLIP VESOCCLUDE SM WIDE 24/CT (CLIP) ×3 IMPLANT
CNTNR URN SCR LID CUP LEK RST (MISCELLANEOUS) IMPLANT
CONN 1/2X1/2X1/2  BEN (MISCELLANEOUS) ×6
CONN 1/2X1/2X1/2 BEN (MISCELLANEOUS) IMPLANT
CONN 3/8X1/2 ST GISH (MISCELLANEOUS) ×2 IMPLANT
CONN ST 1/2X1/2  BEN (MISCELLANEOUS) ×6
CONN ST 1/2X1/2 BEN (MISCELLANEOUS) IMPLANT
CONN ST 1/4X3/8  BEN (MISCELLANEOUS) ×12
CONN ST 1/4X3/8 BEN (MISCELLANEOUS) IMPLANT
CONT SPEC 4OZ STRL OR WHT (MISCELLANEOUS) ×6
COVER MAYO STAND STRL (DRAPES) ×1 IMPLANT
CUFF TOURN SGL QUICK 18X4 (TOURNIQUET CUFF) IMPLANT
CUFF TOURN SGL QUICK 24 (TOURNIQUET CUFF)
CUFF TRNQT CYL 24X4X16.5-23 (TOURNIQUET CUFF) IMPLANT
DERMABOND ADHESIVE PROPEN (GAUZE/BANDAGES/DRESSINGS) ×1
DERMABOND ADVANCED .7 DNX6 (GAUZE/BANDAGES/DRESSINGS) IMPLANT
DRAPE C-ARM 42X72 X-RAY (DRAPES) ×1 IMPLANT
DRAPE CARDIOVASCULAR INCISE (DRAPES) ×6
DRAPE EXTREMITY T 121X128X90 (DISPOSABLE) ×6 IMPLANT
DRAPE HALF SHEET 40X57 (DRAPES) IMPLANT
DRAPE SLUSH/WARMER DISC (DRAPES) ×6 IMPLANT
DRAPE SRG 135X102X78XABS (DRAPES) ×5 IMPLANT
DRSG COVADERM 4X14 (GAUZE/BANDAGES/DRESSINGS) ×6 IMPLANT
ELECT REM PT RETURN 9FT ADLT (ELECTROSURGICAL) ×12
ELECTRODE REM PT RTRN 9FT ADLT (ELECTROSURGICAL) ×10 IMPLANT
FELT TEFLON 1X6 (MISCELLANEOUS) ×12 IMPLANT
GAUZE SPONGE 4X4 12PLY STRL (GAUZE/BANDAGES/DRESSINGS) ×12 IMPLANT
GAUZE SPONGE 4X4 12PLY STRL LF (GAUZE/BANDAGES/DRESSINGS) ×1 IMPLANT
GEL ULTRASOUND 20GR AQUASONIC (MISCELLANEOUS) IMPLANT
GLOVE BIO SURGEON STRL SZ 6 (GLOVE) ×1 IMPLANT
GLOVE BIO SURGEON STRL SZ 6.5 (GLOVE) ×8 IMPLANT
GLOVE BIO SURGEON STRL SZ8 (GLOVE) ×2 IMPLANT
GLOVE BIOGEL PI IND STRL 6.5 (GLOVE) IMPLANT
GLOVE BIOGEL PI IND STRL 8 (GLOVE) IMPLANT
GLOVE BIOGEL PI IND STRL 8.5 (GLOVE) IMPLANT
GLOVE BIOGEL PI INDICATOR 6.5 (GLOVE) ×2
GLOVE BIOGEL PI INDICATOR 8 (GLOVE) ×1
GLOVE BIOGEL PI INDICATOR 8.5 (GLOVE) ×1
GLOVE SURG SIGNA 7.5 PF LTX (GLOVE) ×18 IMPLANT
GLOVE SURG SS PI 7.5 STRL IVOR (GLOVE) ×3 IMPLANT
GOWN STRL REUS W/ TWL LRG LVL3 (GOWN DISPOSABLE) ×20 IMPLANT
GOWN STRL REUS W/ TWL XL LVL3 (GOWN DISPOSABLE) ×10 IMPLANT
GOWN STRL REUS W/TWL LRG LVL3 (GOWN DISPOSABLE) ×72
GOWN STRL REUS W/TWL XL LVL3 (GOWN DISPOSABLE) ×24
GRAFT GELWEAVE IMPREG 10X30CM (Prosthesis & Implant Heart) ×1 IMPLANT
HEMOSTAT POWDER SURGIFOAM 1G (HEMOSTASIS) ×18 IMPLANT
HEMOSTAT SURGICEL 2X14 (HEMOSTASIS) ×8 IMPLANT
HEMOSTAT SURGICEL 4X8 (HEMOSTASIS) ×1 IMPLANT
INSERT CLAMP STEALTH SZ3 (MISCELLANEOUS) ×12
INSERT FOGARTY SM (MISCELLANEOUS) ×1 IMPLANT
INSERT FOGARTY XLG (MISCELLANEOUS) IMPLANT
IV ADAPTER SYR DOUBLE MALE LL (MISCELLANEOUS) ×1 IMPLANT
KIT BASIN OR (CUSTOM PROCEDURE TRAY) ×6 IMPLANT
KIT SUCTION CATH 14FR (SUCTIONS) ×12 IMPLANT
KIT TURNOVER KIT B (KITS) ×6 IMPLANT
KIT VASOVIEW HEMOPRO 2 VH 4000 (KITS) ×6 IMPLANT
LINE VENT (MISCELLANEOUS) ×1 IMPLANT
LOOP VESSEL MAXI BLUE (MISCELLANEOUS) ×2 IMPLANT
LOOP VESSEL SUPERMAXI WHITE (MISCELLANEOUS) ×1 IMPLANT
MARKER GRAFT CORONARY BYPASS (MISCELLANEOUS) ×18 IMPLANT
NS IRRIG 1000ML POUR BTL (IV SOLUTION) ×32 IMPLANT
PACK E OPEN HEART (SUTURE) ×6 IMPLANT
PACK OPEN HEART (CUSTOM PROCEDURE TRAY) ×6 IMPLANT
PAD ARMBOARD 7.5X6 YLW CONV (MISCELLANEOUS) ×12 IMPLANT
PAD ELECT DEFIB RADIOL ZOLL (MISCELLANEOUS) ×6 IMPLANT
PENCIL BUTTON HOLSTER BLD 10FT (ELECTRODE) ×6 IMPLANT
POSITIONER HEAD DONUT 9IN (MISCELLANEOUS) ×6 IMPLANT
PUMP SET IMPELLA 5.5 US (CATHETERS) ×1 IMPLANT
PUNCH AORTIC ROTATE 4.0MM (MISCELLANEOUS) ×1 IMPLANT
PUNCH AORTIC ROTATE 4.5MM 8IN (MISCELLANEOUS) IMPLANT
PUNCH AORTIC ROTATE 5MM 8IN (MISCELLANEOUS) IMPLANT
RING ANNULOFLEX SZ 32 (Ring) ×1 IMPLANT
SAVER CELL AUTOCOAG (MISCELLANEOUS) IMPLANT
SET CARDIOPLEGIA MPS 5001102 (MISCELLANEOUS) ×1 IMPLANT
SHEARS HARMONIC 9CM CVD (BLADE) ×7 IMPLANT
SPONGE LAP 18X18 RF (DISPOSABLE) ×1 IMPLANT
SPONGE LAP 18X18 X RAY DECT (DISPOSABLE) ×1 IMPLANT
STAPLER VISISTAT 35W (STAPLE) ×1 IMPLANT
STOPCOCK 4 WAY LG BORE MALE ST (IV SETS) ×1 IMPLANT
SUPPORT HEART JANKE-BARRON (MISCELLANEOUS) ×6 IMPLANT
SUT BONE WAX W31G (SUTURE) ×6 IMPLANT
SUT ETHIBOND (SUTURE) ×2 IMPLANT
SUT ETHIBOND 2 0 SH (SUTURE) ×12
SUT ETHIBOND 2 0 SH 36X2 (SUTURE) IMPLANT
SUT ETHIBOND 2-0 RB-1 WHT (SUTURE) ×2 IMPLANT
SUT ETHIBOND 4 0 TF (SUTURE) ×2 IMPLANT
SUT MNCRL AB 4-0 PS2 18 (SUTURE) ×3 IMPLANT
SUT PROLENE 3 0 SH DA (SUTURE) ×7 IMPLANT
SUT PROLENE 4 0 RB 1 (SUTURE) ×12
SUT PROLENE 4 0 SH DA (SUTURE) ×5 IMPLANT
SUT PROLENE 4-0 RB1 .5 CRCL 36 (SUTURE) IMPLANT
SUT PROLENE 5 0 C 1 36 (SUTURE) ×7 IMPLANT
SUT PROLENE 6 0 C 1 30 (SUTURE) ×16 IMPLANT
SUT PROLENE 7 0 BV 1 (SUTURE) ×3 IMPLANT
SUT PROLENE 7 0 BV1 MDA (SUTURE) ×7 IMPLANT
SUT PROLENE 8 0 BV175 6 (SUTURE) ×2 IMPLANT
SUT SILK  1 MH (SUTURE) ×18
SUT SILK 1 MH (SUTURE) IMPLANT
SUT SILK 2 0 (SUTURE) ×6
SUT SILK 2 0 SH CR/8 (SUTURE) ×1 IMPLANT
SUT SILK 2-0 18XBRD TIE 12 (SUTURE) IMPLANT
SUT SILK 4 0 TIE 10X30 (SUTURE) ×1 IMPLANT
SUT STEEL 6MS V (SUTURE) ×6 IMPLANT
SUT STEEL STERNAL CCS#1 18IN (SUTURE) IMPLANT
SUT STEEL SZ 6 DBL 3X14 BALL (SUTURE) ×6 IMPLANT
SUT TEM PAC WIRE 2 0 SH (SUTURE) ×1 IMPLANT
SUT VIC AB 1 CTX 36 (SUTURE) ×12
SUT VIC AB 1 CTX36XBRD ANBCTR (SUTURE) ×10 IMPLANT
SUT VIC AB 2-0 CT1 27 (SUTURE)
SUT VIC AB 2-0 CT1 36 (SUTURE) ×3 IMPLANT
SUT VIC AB 2-0 CT1 TAPERPNT 27 (SUTURE) IMPLANT
SUT VIC AB 2-0 CTX 27 (SUTURE) IMPLANT
SUT VIC AB 3-0 SH 27 (SUTURE)
SUT VIC AB 3-0 SH 27X BRD (SUTURE) IMPLANT
SUT VIC AB 3-0 X1 27 (SUTURE) IMPLANT
SUT VICRYL 4-0 PS2 18IN ABS (SUTURE) IMPLANT
SYR 50ML SLIP (SYRINGE) IMPLANT
SYR 5ML LUER SLIP (SYRINGE) ×1 IMPLANT
SYR BULB IRRIG 60ML STRL (SYRINGE) ×1 IMPLANT
SYSTEM SAHARA CHEST DRAIN ATS (WOUND CARE) ×6 IMPLANT
TAPE CLOTH SURG 4X10 WHT LF (GAUZE/BANDAGES/DRESSINGS) ×1 IMPLANT
TAPE PAPER 2X10 WHT MICROPORE (GAUZE/BANDAGES/DRESSINGS) ×1 IMPLANT
TOWEL GREEN STERILE (TOWEL DISPOSABLE) ×6 IMPLANT
TOWEL GREEN STERILE FF (TOWEL DISPOSABLE) ×6 IMPLANT
TRAY CATH LUMEN 1 20CM STRL (SET/KITS/TRAYS/PACK) ×2 IMPLANT
TRAY FOLEY SLVR 16FR TEMP STAT (SET/KITS/TRAYS/PACK) ×6 IMPLANT
TUBING ART PRESS 48 MALE/FEM (TUBING) ×2 IMPLANT
TUBING LAP HI FLOW INSUFFLATIO (TUBING) ×6 IMPLANT
UNDERPAD 30X36 HEAVY ABSORB (UNDERPADS AND DIAPERS) ×6 IMPLANT
VRC MALLEABLE SINGLE STG 36FR (MISCELLANEOUS) ×6
WATER STERILE IRR 1000ML POUR (IV SOLUTION) ×12 IMPLANT
WIRE EMERALD 3MM-J .035X260CM (WIRE) ×1 IMPLANT

## 2020-07-07 NOTE — Progress Notes (Signed)
Patient ID: Jerry [name], male   DOB: 01-09-1971, 49 y.o.   MRN: [id number] P    Advanced Heart Failure Rounding Note  PCP-Cardiologist: Sherren Mocha, MD    Patient Profile   49 y/o male s/p recent back surgery, admitted for acute inferior and anterolateral MI. LHC showed thrombotic occlusion of distal RCA that was treated with PTCA and thrombectomy.  He was also noted to have chronic total occlusion of the LAD with collaterals from the right (thus this territory was affected by the RCA MI) as well as 90% complex proximal LCx stenosis. EF 25-30% + apical thrombus. RV normal. Course b/c cardiogenic shock and acute hypoxic respiratory failure. ? Component of septic shock.    Subjective:    Milrinone continues at 0.375, co-ox 60%.    He is in NSR on amiodarone gtt and heparin gtt.   Slept well, ready for surgery. Chest pain has resolved.   Objective:   Weight Range: 71 kg Body mass index is 23.8 kg/m.   Vital Signs:   Temp:  [97.1 F (36.2 C)-98 F (36.7 C)] 98 F (36.7 C) (10/13 0437) Pulse Rate:  [90-109] 99 (10/13 0548) Resp:  [18-20] 20 (10/13 0437) BP: (92-120)/(61-77) 110/75 (10/13 0548) SpO2:  [95 %-99 %] 99 % (10/13 0437) Weight:  [71 kg] 71 kg (10/13 0618) Last BM Date: 07/06/20  Weight change: Filed Weights   07/05/20 0300 07/06/20 0403 07/07/20 0618  Weight: 73.7 kg 71.4 kg 71 kg    Intake/Output:   Intake/Output Summary (Last 24 hours) at 07/07/2020 0710 Last data filed at 07/07/2020 0405 Gross per 24 hour  Intake 2537.71 ml  Output 600 ml  Net 1937.71 ml      Physical Exam   General: NAD Neck: No JVD, no thyromegaly or thyroid nodule.  Lungs: Clear to auscultation bilaterally with normal respiratory effort. CV: Nondisplaced PMI.  Heart regular S1/S2, no S3/S4, no murmur.  No peripheral edema.   Abdomen: Soft, nontender, no hepatosplenomegaly, no distention.  Skin: Intact without lesions or rashes.  Neurologic: Alert and oriented x 3.  Psych:  Normal affect. Extremities: No clubbing or cyanosis.  HEENT: Normal.    Telemetry   NSR 90s Personally reviewed  Labs    CBC Recent Labs    07/06/20 0408 07/07/20 0120  WBC 14.4* 14.5*  HGB 11.7* 11.2*  HCT 37.3* 36.2*  MCV 91.0 91.4  PLT 324 500   Basic Metabolic Panel Recent Labs    07/06/20 0408 07/07/20 0120  NA 140 138  K 3.7 3.7  CL 107 106  CO2 24 22  GLUCOSE 104* 127*  BUN 10 14  CREATININE 0.98 0.98  CALCIUM 8.5* 8.7*  MG 2.3 2.2   Liver Function Tests Recent Labs    07/07/20 0120  AST 36  ALT 43  ALKPHOS 55  BILITOT 0.5  PROT 6.3*  ALBUMIN 2.7*   No results for input(s): LIPASE, AMYLASE in the last 72 hours. Cardiac Enzymes No results for input(s): CKTOTAL, CKMB, CKMBINDEX, TROPONINI in the last 72 hours.  BNP: BNP (last 3 results) Recent Labs    06/25/20 2334 06/26/20 0150  BNP 349.1* 368.4*    ProBNP (last 3 results) No results for input(s): PROBNP in the last 8760 hours.   D-Dimer No results for input(s): DDIMER in the last 72 hours. Hemoglobin A1C Recent Labs    07/07/20 0120  HGBA1C 5.4   Fasting Lipid Panel No results for input(s): CHOL, HDL, LDLCALC, TRIG, CHOLHDL, LDLDIRECT in the  last 72 hours. Thyroid Function Tests No results for input(s): TSH, T4TOTAL, T3FREE, THYROIDAB in the last 72 hours.  Invalid input(s): FREET3  Other results:   Imaging    DG Chest 2 View  Result Date: 07/07/2020 CLINICAL DATA:  Preop cardiovascular exam EXAM: CHEST - 2 VIEW COMPARISON:  07/05/2020 FINDINGS: Unchanged basilar predominant interstitial pulmonary edema. Position of left PICC line is unchanged. Postsurgical changes at the left lung apex. Mild cardiomegaly. Small left pleural effusion. IMPRESSION: Unchanged cardiomegaly and basilar predominant interstitial pulmonary edema. Small left pleural effusion. Electronically Signed   By: Ulyses Jarred M.D.   On: 07/07/2020 00:53     Medications:     Scheduled Medications: .  aspirin  325 mg Oral BID  . atorvastatin  80 mg Oral Daily  . Chlorhexidine Gluconate Cloth  6 each Topical Daily  . clonazePAM  0.5 mg Oral TID BM  . colchicine  0.6 mg Oral BID  . digoxin  0.125 mg Oral Daily  . docusate  100 mg Oral BID  . epinephrine  0-10 mcg/min Intravenous To OR  . feeding supplement (ENSURE ENLIVE)  237 mL Oral TID BM  . heparin-papaverine-plasmalyte irrigation   Irrigation To OR  . insulin aspart  1-3 Units Subcutaneous Q4H  . insulin   Intravenous To OR  . magnesium sulfate  40 mEq Other To OR  . melatonin  3 mg Oral QHS  . multivitamin with minerals  1 tablet Oral Daily  . oxyCODONE-acetaminophen  1 tablet Oral Q6H  . pantoprazole  40 mg Oral Daily  . phenylephrine  30-200 mcg/min Intravenous To OR  . polyethylene glycol  17 g Oral Daily  . potassium chloride  80 mEq Other To OR  . sodium chloride flush  10-40 mL Intracatheter Q12H  . sodium chloride flush  3 mL Intravenous Q12H  . tranexamic acid  15 mg/kg Intravenous To OR  . tranexamic acid  2 mg/kg Intracatheter To OR  . traZODone  100 mg Oral QHS    Infusions: . sodium chloride 10 mL/hr at 07/04/20 0700  . sodium chloride 10 mL/hr at 06/26/20 0700  . amiodarone 30 mg/hr (07/07/20 0525)  . cefUROXime (ZINACEF)  IV    . cefUROXime (ZINACEF)  IV    . dexmedetomidine (PRECEDEX) IV infusion Stopped (07/04/20 0133)  . dexmedetomidine    . heparin 30,000 units/NS 1000 mL solution for CELLSAVER    . heparin 1,950 Units/hr (07/07/20 0546)  . milrinone 0.375 mcg/kg/min (07/07/20 0405)  . milrinone    . nitroGLYCERIN    . norepinephrine (LEVOPHED) Adult infusion Stopped (07/01/20 1806)  . norepinephrine    . tranexamic acid (CYKLOKAPRON) infusion ([facility])    . vancomycin      PRN Medications: sodium chloride, acetaminophen, ALPRAZolam, fentaNYL (SUBLIMAZE) injection, guaiFENesin-dextromethorphan, menthol-cetylpyridinium, nitroGLYCERIN, ondansetron (ZOFRAN) IV, phenol, sodium chloride flush, sodium  chloride flush, traZODone    Assessment/Plan   1. CAD: Late presentation inferior MI.  Cath showed thrombotic occlusion distal RCA, CTO LAD with collaterals from right, and complex 90% proximal LCx stenosis.  He had PTCA/thrombectomy RCA with good flow at end of procedure.  Suspect he had damage to LAD territory as well as RCA territory as RCA collateralized the LAD.  No chest pain.  - ASA/statin.  - CABG today, high risk. Will need Impella 5.5 at time of CABG if no thrombus on intra-op TEE. 2. Post-infarct pericarditis: He has had persistent STE on ECG and prominent pleuritic chest pain as well as  a soft friction rub. Suspect post-MI pericarditis. ESR elevated at 20.  Much better now. CP resolved.  - Can decrease ASA to once daily.  - Colchicine 0.6 bid.  - No steroids post-MI, no traditional NSAIDs.  3. Acute systolic CHF>>Cardiogenic Shock: Echo with EF 25-30%, RV ok (no evidence for RV infarct), LV thrombus.  Repeat bedside echo 10/3  EF 25%, normal RV w/ no pericardial effusion, no LV thrombus seen on this echo. Co-ox 60% today, not volume overloaded.  - Continue milrinone 0.375.  - Lasix on hold.  - Continue digoxin 0.125 daily.  - Off ivabradine with atrial fibrillation.  - Impella 5.5 with CABG if no LV thrombus noted on intra-op TEE.    4. AF with RVR: Noted 10/12, now in NSR on amiodarone.  - Continue amiodarone gtt.  5. LV thrombus: Resolved on 10/3 echo.  - On heparin drip. .  6. ID: Afebrile now.  PCT was initially mildly elevated 0.96. This may be effect of acute MI with post-infarct pericarditis but treated empirically w/ broad spectrum abx, vanc + cefepime. Possible RLL PNA on CXR.  Blood cultures NGTD, sputum cultures unremarkable.  Completed 7 days vancomycin/cefepime  x 7 days. 8. S/p back surgery: Micro-discectomy just prior to admission.  Neurosurgery following, appreciated.  9. Possible benzo/narcotic withdrawal: Concern GF brought him drugs. Will need to d/w RN  supervisor regarding supervised visits only. Improved this morning.  - Continue  Klonopin 0.5 tid.   10. Insomnia - continue melatonin  - can use trazodone at night  Loralie Champagne, MD 07/07/2020 7:10 AM

## 2020-07-07 NOTE — Anesthesia Procedure Notes (Signed)
Central Venous Catheter Insertion Performed by: Oleta Mouse, MD, anesthesiologist Start/End10/13/2021 6:46 AM, 07/07/2020 6:56 AM Patient location: Pre-op. Preanesthetic checklist: patient identified, IV checked, site marked, risks and benefits discussed, surgical consent, monitors and equipment checked, pre-op evaluation, timeout performed and anesthesia consent Lidocaine 1% used for infiltration and patient sedated Hand hygiene performed  and maximum sterile barriers used  Catheter size: 9 Fr Total catheter length 10. MAC introducer Procedure performed using ultrasound guided technique. Ultrasound Notes:anatomy identified, needle tip was noted to be adjacent to the nerve/plexus identified, no ultrasound evidence of intravascular and/or intraneural injection and image(s) printed for medical record Attempts: 1 Following insertion, line sutured, dressing applied and Biopatch. Post procedure assessment: blood return through all ports, free fluid flow and no air  Patient tolerated the procedure well with no immediate complications.

## 2020-07-07 NOTE — Progress Notes (Signed)
      [address]       [place] [address]             [phone]      Intubated, sedated  BP 110/75   Pulse 98   Temp 98 F (36.7 C) (Oral)   Resp (!) 21   Ht 5\' 8"  (1.727 m)   Wt 71 kg   SpO2 95%   BMI 23.80 kg/m  CO= 3.16 with Impella flow of 3   Intake/Output Summary (Last 24 hours) at 07/07/2020 1913 Last data filed at 07/07/2020 1758 Gross per 24 hour  Intake 5956.9 ml  Output 3135 ml  Net 2821.9 ml   Hct= 32, PLT 165 K Most recent K =4.2  Stable early postop but has multiple issues  Will keep on vent overnight reduce afterload  07/09/2020 C. Viviann Spare, MD [facility] [phone]

## 2020-07-07 NOTE — Anesthesia Procedure Notes (Signed)
Procedure Name: Intubation Date/Time: 07/07/2020 8:53 AM Performed by: Reece Agar, CRNA Pre-anesthesia Checklist: Patient identified, Emergency Drugs available, Suction available and Patient being monitored Patient Re-evaluated:Patient Re-evaluated prior to induction Oxygen Delivery Method: Circle System Utilized Preoxygenation: Pre-oxygenation with 100% oxygen Induction Type: IV induction Ventilation: Mask ventilation without difficulty Laryngoscope Size: Mac and 4 Grade View: Grade I Tube type: Oral Tube size: 8.0 mm Number of attempts: 1 Airway Equipment and Method: Stylet and Oral airway Placement Confirmation: ETT inserted through vocal cords under direct vision,  positive ETCO2 and breath sounds checked- equal and bilateral Secured at: 23 cm Tube secured with: Tape Dental Injury: Teeth and Oropharynx as per pre-operative assessment

## 2020-07-07 NOTE — Anesthesia Procedure Notes (Addendum)
Arterial Line Insertion Start/End10/13/2021 7:05 AM, 07/07/2020 7:15 AM Performed by: Marcene Duos, MD, anesthesiologist  Patient location: Pre-op. Preanesthetic checklist: patient identified, IV checked, risks and benefits discussed, surgical consent, monitors and equipment checked, pre-op evaluation, timeout performed and anesthesia consent Lidocaine 1% used for infiltration and patient sedated Right, brachial was placed Catheter size: 20 G Hand hygiene performed , maximum sterile barriers used  and Seldinger technique used Allen's test indicative of satisfactory collateral circulation Attempts: 1 Procedure performed using ultrasound guided technique. Ultrasound Notes:anatomy identified, needle tip was noted to be adjacent to the nerve/plexus identified, no ultrasound evidence of intravascular and/or intraneural injection and image(s) printed for medical record Following insertion, dressing applied and Biopatch. Post procedure assessment: normal and unchanged  Patient tolerated the procedure well with no immediate complications.

## 2020-07-07 NOTE — Transfer of Care (Signed)
Immediate Anesthesia Transfer of Care Note  Patient: Engineer, site  Procedure(s) Performed: CORONARY ARTERY BYPASS GRAFTING (CABG) x FOUR, USING LEFT RADIAL ARTERY AND RIGHT LEG GREATER SAPHENOUS VEIN HARVESTED ENDOSCOPICALLY (N/A Chest) RADIAL ARTERY HARVEST left (Left Arm Lower) PLACEMENT OF IMPELLA LEFT VENTRICULAR ASSIST DEVICE USING ABIOMED IMPELLA 5.5 (Right Axilla) TRANSESOPHAGEAL ECHOCARDIOGRAM (TEE) (N/A ) ENDOVEIN HARVEST OF GREATER SAPHENOUS VEIN (Right Leg Upper)  Patient Location: SICU  Anesthesia Type:General  Level of Consciousness: Patient remains intubated per anesthesia plan  Airway & Oxygen Therapy: Patient remains intubated per anesthesia plan  Post-op Assessment: Report given to RN and Post -op Vital signs reviewed and stable  Post vital signs: Reviewed and stable  Last Vitals:  Vitals Value Taken Time  BP    Temp    Pulse    Resp    SpO2      Last Pain:  Vitals:   07/07/20 0437  TempSrc: Oral  PainSc:       Patients Stated Pain Goal: 0 (07/04/20 0400)  Complications: No complications documented.

## 2020-07-07 NOTE — Progress Notes (Signed)
  Echocardiogram Echocardiogram Transesophageal has been performed.  Jerry [name] 07/07/2020, 9:49 AM

## 2020-07-07 NOTE — Anesthesia Procedure Notes (Signed)
Central Venous Catheter Insertion Performed by: Val Eagle, MD, anesthesiologist Start/End10/13/2021 6:46 AM, 07/07/2020 6:56 AM Patient location: Pre-op. Preanesthetic checklist: patient identified, IV checked, site marked, risks and benefits discussed, surgical consent, monitors and equipment checked, pre-op evaluation, timeout performed and anesthesia consent Hand hygiene performed  and maximum sterile barriers used  PA cath was placed.Swan type:thermodilution PA Cath depth:48 Procedure performed without using ultrasound guided technique. Attempts: 1 Patient tolerated the procedure well with no immediate complications.

## 2020-07-07 NOTE — Brief Op Note (Addendum)
06/25/2020 - 07/07/2020  1:56 PM  PATIENT:  Jerry [name]  48 y.o. male  PRE-OPERATIVE DIAGNOSIS:  Coronary Artery Disease, Ischemic Cardiomyopathy, Mitral Insufficiency  POST-OPERATIVE DIAGNOSIS:  Coronary Artery Disease, Ischemic Cardiomyopathy, Mitral Insufficiency  PROCEDURE:   CORONARY ARTERY BYPASS GRAFTING (CABG) x FOUR, USING LEFT INTERNAL MAMMARY ARTERY, LEFT RADIAL ARTERY AND LEFT LEG GREATER SAPHENOUS VEIN HARVESTED ENDOSCOPICALLY (N/A) RADIAL ARTERY HARVEST left (Left)  LIMA->LAD  LRA->OM  SVG->PD->PL MITRAL VALVE ANNULOPLASTY- 70mm  Carbomedics annuloplasty ring PLACEMENT OF IMPELLA LEFT VENTRICULAR ASSIST DEVICE (N/A) TRANSESOPHAGEAL ECHOCARDIOGRAM (TEE) (N/A)  Vein harvest time:    Prep time Radial artery harvest time:      Prep time: .  XC= 134 min CPB= 299 min SURGEON: Loreli Slot, MD  PHYSICIAN ASSISTANT: Jillyn Hidden, PA-C  ASSISTANTS: Staff RNFA  ANESTHESIA:   general  EBL:  Per perfusion and anesthesia notes   BLOOD ADMINISTERED:none  DRAINS: Bilateral pleural and mediastinal tubes.   LOCAL MEDICATIONS USED:  NONE  SPECIMEN:  Source of Specimen:  AP window lymph node  DISPOSITION OF SPECIMEN:  PATHOLOGY  COUNTS:  YES  DICTATION: .Dragon Dictation  PLAN OF CARE: Admit to inpatient   PATIENT DISPOSITION:  ICU - intubated and hemodynamically stable.   Delay start of Pharmacological VTE agent (>24hrs) due to surgical blood loss or risk of bleeding: yes

## 2020-07-07 NOTE — Interval H&P Note (Signed)
History and Physical Interval Note:  07/07/2020 8:21 AM  Jerry [name]  has presented today for surgery, with the diagnosis of CAD.  The various methods of treatment have been discussed with the patient and family. After consideration of risks, benefits and other options for treatment, the patient has consented to  Procedure(s): CORONARY ARTERY BYPASS GRAFTING (CABG) (N/A) possible, RADIAL ARTERY HARVEST left (Left) possible PLACEMENT OF IMPELLA LEFT VENTRICULAR ASSIST DEVICE (N/A) TRANSESOPHAGEAL ECHOCARDIOGRAM (TEE) (N/A) as a surgical intervention.  The patient's history has been reviewed, patient examined, no change in status, stable for surgery.  I have reviewed the patient's chart and labs.  Questions were answered to the patient's satisfaction.     Loreli Slot

## 2020-07-08 ENCOUNTER — Inpatient Hospital Stay (HOSPITAL_COMMUNITY): Payer: 59

## 2020-07-08 ENCOUNTER — Encounter (HOSPITAL_COMMUNITY)
Admission: EM | Disposition: A | Discharge status: Home / Self Care | Payer: Self-pay | Source: Home / Self Care | Attending: Thoracic Surgery (Cardiothoracic Vascular Surgery)

## 2020-07-08 ENCOUNTER — Inpatient Hospital Stay (HOSPITAL_COMMUNITY): Payer: 59 | Admitting: Certified Registered Nurse Anesthetist

## 2020-07-08 ENCOUNTER — Encounter (HOSPITAL_COMMUNITY): Payer: Self-pay | Admitting: Thoracic Surgery (Cardiothoracic Vascular Surgery)

## 2020-07-08 DIAGNOSIS — I2510 Atherosclerotic heart disease of native coronary artery without angina pectoris: Secondary | ICD-10-CM

## 2020-07-08 DIAGNOSIS — I309 Acute pericarditis, unspecified: Secondary | ICD-10-CM | POA: Diagnosis not present

## 2020-07-08 DIAGNOSIS — R570 Cardiogenic shock: Secondary | ICD-10-CM | POA: Diagnosis not present

## 2020-07-08 DIAGNOSIS — I97631 Postprocedural hematoma of a circulatory system organ or structure following cardiac bypass: Secondary | ICD-10-CM

## 2020-07-08 DIAGNOSIS — I5023 Acute on chronic systolic (congestive) heart failure: Secondary | ICD-10-CM

## 2020-07-08 HISTORY — PX: EXPLORATION POST OPERATIVE OPEN HEART: SHX5061

## 2020-07-08 LAB — BASIC METABOLIC PANEL
Anion gap: 8 (ref 5–15)
BUN: 22 mg/dL — ABNORMAL HIGH (ref 6–20)
CO2: 24 mmol/L (ref 22–32)
Calcium: 7.3 mg/dL — ABNORMAL LOW (ref 8.9–10.3)
Chloride: 112 mmol/L — ABNORMAL HIGH (ref 98–111)
Creatinine, Ser: 1.85 mg/dL — ABNORMAL HIGH (ref 0.61–1.24)
GFR, Estimated: 42 mL/min — ABNORMAL LOW (ref >60)
Glucose, Bld: 130 mg/dL — ABNORMAL HIGH (ref 70–99)
Potassium: 3.9 mmol/L (ref 3.5–5.1)
Sodium: 144 mmol/L (ref 135–145)

## 2020-07-08 LAB — POCT I-STAT 7, (LYTES, BLD GAS, ICA,H+H)
Acid-Base Excess: 0 mmol/L (ref 0.0–2.0)
Acid-base deficit: 5 mmol/L — ABNORMAL HIGH (ref 0.0–2.0)
Acid-base deficit: 9 mmol/L — ABNORMAL HIGH (ref 0.0–2.0)
Acid-base deficit: 13 mmol/L — ABNORMAL HIGH (ref 0.0–2.0)
Acid-base deficit: 10 mmol/L — ABNORMAL HIGH (ref 0.0–2.0)
Acid-base deficit: 9 mmol/L — ABNORMAL HIGH (ref 0.0–2.0)
Acid-base deficit: 8 mmol/L — ABNORMAL HIGH (ref 0.0–2.0)
Acid-base deficit: 5 mmol/L — ABNORMAL HIGH (ref 0.0–2.0)
Acid-base deficit: 6 mmol/L — ABNORMAL HIGH (ref 0.0–2.0)
Bicarbonate: 21.7 mmol/L (ref 20.0–28.0)
Bicarbonate: 14.6 mmol/L — ABNORMAL LOW (ref 20.0–28.0)
Bicarbonate: 11 mmol/L — ABNORMAL LOW (ref 20.0–28.0)
Bicarbonate: 14.8 mmol/L — ABNORMAL LOW (ref 20.0–28.0)
Bicarbonate: 15.6 mmol/L — ABNORMAL LOW (ref 20.0–28.0)
Bicarbonate: 17.7 mmol/L — ABNORMAL LOW (ref 20.0–28.0)
Bicarbonate: 22.9 mmol/L (ref 20.0–28.0)
Bicarbonate: 19.7 mmol/L — ABNORMAL LOW (ref 20.0–28.0)
Bicarbonate: 24.8 mmol/L (ref 20.0–28.0)
Calcium, Ion: 1.2 mmol/L (ref 1.15–1.40)
Calcium, Ion: 1.1 mmol/L — ABNORMAL LOW (ref 1.15–1.40)
Calcium, Ion: 1.1 mmol/L — ABNORMAL LOW (ref 1.15–1.40)
Calcium, Ion: 1.19 mmol/L (ref 1.15–1.40)
Calcium, Ion: 1.1 mmol/L — ABNORMAL LOW (ref 1.15–1.40)
Calcium, Ion: 1.1 mmol/L — ABNORMAL LOW (ref 1.15–1.40)
Calcium, Ion: 1.01 mmol/L — ABNORMAL LOW (ref 1.15–1.40)
Calcium, Ion: 1 mmol/L — ABNORMAL LOW (ref 1.15–1.40)
Calcium, Ion: 1.06 mmol/L — ABNORMAL LOW (ref 1.15–1.40)
HCT: 32 % — ABNORMAL LOW (ref 39.0–52.0)
HCT: 20 % — ABNORMAL LOW (ref 39.0–52.0)
HCT: 22 % — ABNORMAL LOW (ref 39.0–52.0)
HCT: 28 % — ABNORMAL LOW (ref 39.0–52.0)
HCT: 26 % — ABNORMAL LOW (ref 39.0–52.0)
HCT: 25 % — ABNORMAL LOW (ref 39.0–52.0)
HCT: 24 % — ABNORMAL LOW (ref 39.0–52.0)
HCT: 22 % — ABNORMAL LOW (ref 39.0–52.0)
HCT: 22 % — ABNORMAL LOW (ref 39.0–52.0)
Hemoglobin: 10.9 g/dL — ABNORMAL LOW (ref 13.0–17.0)
Hemoglobin: 6.8 g/dL — CL (ref 13.0–17.0)
Hemoglobin: 7.5 g/dL — ABNORMAL LOW (ref 13.0–17.0)
Hemoglobin: 9.5 g/dL — ABNORMAL LOW (ref 13.0–17.0)
Hemoglobin: 8.8 g/dL — ABNORMAL LOW (ref 13.0–17.0)
Hemoglobin: 8.5 g/dL — ABNORMAL LOW (ref 13.0–17.0)
Hemoglobin: 8.2 g/dL — ABNORMAL LOW (ref 13.0–17.0)
Hemoglobin: 7.5 g/dL — ABNORMAL LOW (ref 13.0–17.0)
Hemoglobin: 7.5 g/dL — ABNORMAL LOW (ref 13.0–17.0)
O2 Saturation: 97 %
O2 Saturation: 100 %
O2 Saturation: 100 %
O2 Saturation: 100 %
O2 Saturation: 100 %
O2 Saturation: 100 %
O2 Saturation: 100 %
O2 Saturation: 97 %
O2 Saturation: 100 %
Patient temperature: 36.8
Patient temperature: 38.3
Patient temperature: 38.1
Patient temperature: 37.5
Patient temperature: 37.6
Patient temperature: 36.7
Patient temperature: 36
Patient temperature: 36.2
Potassium: 4.8 mmol/L (ref 3.5–5.1)
Potassium: 4.5 mmol/L (ref 3.5–5.1)
Potassium: 4.4 mmol/L (ref 3.5–5.1)
Potassium: 4.1 mmol/L (ref 3.5–5.1)
Potassium: 4.1 mmol/L (ref 3.5–5.1)
Potassium: 4.2 mmol/L (ref 3.5–5.1)
Potassium: 3.7 mmol/L (ref 3.5–5.1)
Potassium: 3.3 mmol/L — ABNORMAL LOW (ref 3.5–5.1)
Potassium: 3.9 mmol/L (ref 3.5–5.1)
Sodium: 142 mmol/L (ref 135–145)
Sodium: 142 mmol/L (ref 135–145)
Sodium: 141 mmol/L (ref 135–145)
Sodium: 145 mmol/L (ref 135–145)
Sodium: 145 mmol/L (ref 135–145)
Sodium: 145 mmol/L (ref 135–145)
Sodium: 147 mmol/L — ABNORMAL HIGH (ref 135–145)
Sodium: 148 mmol/L — ABNORMAL HIGH (ref 135–145)
Sodium: 147 mmol/L — ABNORMAL HIGH (ref 135–145)
TCO2: 23 mmol/L (ref 22–32)
TCO2: 15 mmol/L — ABNORMAL LOW (ref 22–32)
TCO2: 12 mmol/L — ABNORMAL LOW (ref 22–32)
TCO2: 16 mmol/L — ABNORMAL LOW (ref 22–32)
TCO2: 16 mmol/L — ABNORMAL LOW (ref 22–32)
TCO2: 19 mmol/L — ABNORMAL LOW (ref 22–32)
TCO2: 25 mmol/L (ref 22–32)
TCO2: 21 mmol/L — ABNORMAL LOW (ref 22–32)
TCO2: 26 mmol/L (ref 22–32)
pCO2 arterial: 47.8 mmHg (ref 32.0–48.0)
pCO2 arterial: 24.3 mmHg — ABNORMAL LOW (ref 32.0–48.0)
pCO2 arterial: 21.6 mmHg — ABNORMAL LOW (ref 32.0–48.0)
pCO2 arterial: 28 mmHg — ABNORMAL LOW (ref 32.0–48.0)
pCO2 arterial: 29.7 mmHg — ABNORMAL LOW (ref 32.0–48.0)
pCO2 arterial: 36.7 mmHg (ref 32.0–48.0)
pCO2 arterial: 58.9 mmHg — ABNORMAL HIGH (ref 32.0–48.0)
pCO2 arterial: 37.3 mmHg (ref 32.0–48.0)
pCO2 arterial: 38.5 mmHg (ref 32.0–48.0)
pH, Arterial: 7.264 — ABNORMAL LOW (ref 7.350–7.450)
pH, Arterial: 7.391 (ref 7.350–7.450)
pH, Arterial: 7.32 — ABNORMAL LOW (ref 7.350–7.450)
pH, Arterial: 7.334 — ABNORMAL LOW (ref 7.350–7.450)
pH, Arterial: 7.331 — ABNORMAL LOW (ref 7.350–7.450)
pH, Arterial: 7.291 — ABNORMAL LOW (ref 7.350–7.450)
pH, Arterial: 7.197 — CL (ref 7.350–7.450)
pH, Arterial: 7.326 — ABNORMAL LOW (ref 7.350–7.450)
pH, Arterial: 7.414 (ref 7.350–7.450)
pO2, Arterial: 102 mmHg (ref 83.0–108.0)
pO2, Arterial: 211 mmHg — ABNORMAL HIGH (ref 83.0–108.0)
pO2, Arterial: 210 mmHg — ABNORMAL HIGH (ref 83.0–108.0)
pO2, Arterial: 186 mmHg — ABNORMAL HIGH (ref 83.0–108.0)
pO2, Arterial: 203 mmHg — ABNORMAL HIGH (ref 83.0–108.0)
pO2, Arterial: 454 mmHg — ABNORMAL HIGH (ref 83.0–108.0)
pO2, Arterial: 461 mmHg — ABNORMAL HIGH (ref 83.0–108.0)
pO2, Arterial: 91 mmHg (ref 83.0–108.0)
pO2, Arterial: 172 mmHg — ABNORMAL HIGH (ref 83.0–108.0)

## 2020-07-08 LAB — CBC
HCT: 28.4 % — ABNORMAL LOW (ref 39.0–52.0)
HCT: 24.7 % — ABNORMAL LOW (ref 39.0–52.0)
Hemoglobin: 9 g/dL — ABNORMAL LOW (ref 13.0–17.0)
Hemoglobin: 7.9 g/dL — ABNORMAL LOW (ref 13.0–17.0)
MCH: 29.2 pg (ref 26.0–34.0)
MCH: 28.8 pg (ref 26.0–34.0)
MCHC: 31.7 g/dL (ref 30.0–36.0)
MCHC: 32 g/dL (ref 30.0–36.0)
MCV: 92.2 fL (ref 80.0–100.0)
MCV: 90.1 fL (ref 80.0–100.0)
Platelets: 104 10*3/uL — ABNORMAL LOW (ref 150–400)
Platelets: 51 10*3/uL — ABNORMAL LOW (ref 150–400)
RBC: 3.08 MIL/uL — ABNORMAL LOW (ref 4.22–5.81)
RBC: 2.74 MIL/uL — ABNORMAL LOW (ref 4.22–5.81)
RDW: 14.6 % (ref 11.5–15.5)
RDW: 14.5 % (ref 11.5–15.5)
WBC: 23.6 10*3/uL — ABNORMAL HIGH (ref 4.0–10.5)
WBC: 14.9 10*3/uL — ABNORMAL HIGH (ref 4.0–10.5)
nRBC: 0 % (ref 0.0–0.2)
nRBC: 0.1 % (ref 0.0–0.2)

## 2020-07-08 LAB — POCT I-STAT, CHEM 8
BUN: 23 mg/dL — ABNORMAL HIGH (ref 6–20)
Calcium, Ion: 1.13 mmol/L — ABNORMAL LOW (ref 1.15–1.40)
Chloride: 109 mmol/L (ref 98–111)
Creatinine, Ser: 2 mg/dL — ABNORMAL HIGH (ref 0.61–1.24)
Glucose, Bld: 200 mg/dL — ABNORMAL HIGH (ref 70–99)
HCT: 27 % — ABNORMAL LOW (ref 39.0–52.0)
Hemoglobin: 9.2 g/dL — ABNORMAL LOW (ref 13.0–17.0)
Potassium: 4.2 mmol/L (ref 3.5–5.1)
Sodium: 145 mmol/L (ref 135–145)
TCO2: 18 mmol/L — ABNORMAL LOW (ref 22–32)

## 2020-07-08 LAB — CBC WITH DIFFERENTIAL/PLATELET
Abs Immature Granulocytes: 0.3 10*3/uL — ABNORMAL HIGH (ref 0.00–0.07)
Abs Immature Granulocytes: 0.41 10*3/uL — ABNORMAL HIGH (ref 0.00–0.07)
Basophils Absolute: 0 10*3/uL (ref 0.0–0.1)
Basophils Absolute: 0.1 10*3/uL (ref 0.0–0.1)
Basophils Relative: 0 %
Basophils Relative: 0 %
Eosinophils Absolute: 0 10*3/uL (ref 0.0–0.5)
Eosinophils Absolute: 0 10*3/uL (ref 0.0–0.5)
Eosinophils Relative: 0 %
Eosinophils Relative: 0 %
HCT: 23.3 % — ABNORMAL LOW (ref 39.0–52.0)
HCT: 25.4 % — ABNORMAL LOW (ref 39.0–52.0)
Hemoglobin: 7.2 g/dL — ABNORMAL LOW (ref 13.0–17.0)
Hemoglobin: 8 g/dL — ABNORMAL LOW (ref 13.0–17.0)
Immature Granulocytes: 1 %
Immature Granulocytes: 2 %
Lymphocytes Relative: 7 %
Lymphocytes Relative: 9 %
Lymphs Abs: 1.7 10*3/uL (ref 0.7–4.0)
Lymphs Abs: 2 10*3/uL (ref 0.7–4.0)
MCH: 28.6 pg (ref 26.0–34.0)
MCH: 29.2 pg (ref 26.0–34.0)
MCHC: 30.9 g/dL (ref 30.0–36.0)
MCHC: 31.5 g/dL (ref 30.0–36.0)
MCV: 92.5 fL (ref 80.0–100.0)
MCV: 92.7 fL (ref 80.0–100.0)
Monocytes Absolute: 1.3 10*3/uL — ABNORMAL HIGH (ref 0.1–1.0)
Monocytes Absolute: 1.4 10*3/uL — ABNORMAL HIGH (ref 0.1–1.0)
Monocytes Relative: 6 %
Monocytes Relative: 6 %
Neutro Abs: 17.5 10*3/uL — ABNORMAL HIGH (ref 1.7–7.7)
Neutro Abs: 21.1 10*3/uL — ABNORMAL HIGH (ref 1.7–7.7)
Neutrophils Relative %: 84 %
Neutrophils Relative %: 85 %
Platelets: 155 10*3/uL (ref 150–400)
Platelets: 161 10*3/uL (ref 150–400)
RBC: 2.52 MIL/uL — ABNORMAL LOW (ref 4.22–5.81)
RBC: 2.74 MIL/uL — ABNORMAL LOW (ref 4.22–5.81)
RDW: 14.6 % (ref 11.5–15.5)
RDW: 14.8 % (ref 11.5–15.5)
WBC: 21.2 10*3/uL — ABNORMAL HIGH (ref 4.0–10.5)
WBC: 24.7 10*3/uL — ABNORMAL HIGH (ref 4.0–10.5)
nRBC: 0 % (ref 0.0–0.2)
nRBC: 0 % (ref 0.0–0.2)

## 2020-07-08 LAB — GLUCOSE, CAPILLARY
Glucose-Capillary: 114 mg/dL — ABNORMAL HIGH (ref 70–99)
Glucose-Capillary: 125 mg/dL — ABNORMAL HIGH (ref 70–99)
Glucose-Capillary: 126 mg/dL — ABNORMAL HIGH (ref 70–99)
Glucose-Capillary: 128 mg/dL — ABNORMAL HIGH (ref 70–99)
Glucose-Capillary: 132 mg/dL — ABNORMAL HIGH (ref 70–99)
Glucose-Capillary: 133 mg/dL — ABNORMAL HIGH (ref 70–99)
Glucose-Capillary: 140 mg/dL — ABNORMAL HIGH (ref 70–99)
Glucose-Capillary: 151 mg/dL — ABNORMAL HIGH (ref 70–99)
Glucose-Capillary: 151 mg/dL — ABNORMAL HIGH (ref 70–99)
Glucose-Capillary: 155 mg/dL — ABNORMAL HIGH (ref 70–99)
Glucose-Capillary: 163 mg/dL — ABNORMAL HIGH (ref 70–99)
Glucose-Capillary: 166 mg/dL — ABNORMAL HIGH (ref 70–99)
Glucose-Capillary: 170 mg/dL — ABNORMAL HIGH (ref 70–99)
Glucose-Capillary: 170 mg/dL — ABNORMAL HIGH (ref 70–99)
Glucose-Capillary: 173 mg/dL — ABNORMAL HIGH (ref 70–99)
Glucose-Capillary: 184 mg/dL — ABNORMAL HIGH (ref 70–99)
Glucose-Capillary: 186 mg/dL — ABNORMAL HIGH (ref 70–99)
Glucose-Capillary: 188 mg/dL — ABNORMAL HIGH (ref 70–99)
Glucose-Capillary: 205 mg/dL — ABNORMAL HIGH (ref 70–99)

## 2020-07-08 LAB — PROTIME-INR
INR: 2.1 — ABNORMAL HIGH (ref 0.8–1.2)
INR: 3.9 — ABNORMAL HIGH (ref 0.8–1.2)
Prothrombin Time: 23 seconds — ABNORMAL HIGH (ref 11.4–15.2)
Prothrombin Time: 37.3 seconds — ABNORMAL HIGH (ref 11.4–15.2)

## 2020-07-08 LAB — ECHOCARDIOGRAM LIMITED
Height: 68 in
Weight: 2504.43 oz

## 2020-07-08 LAB — DIC (DISSEMINATED INTRAVASCULAR COAGULATION)PANEL
D-Dimer, Quant: 1.05 ug/mL-FEU — ABNORMAL HIGH (ref 0.00–0.50)
Fibrinogen: 248 mg/dL (ref 210–475)
INR: 2.2 — ABNORMAL HIGH (ref 0.8–1.2)
Platelets: 51 10*3/uL — ABNORMAL LOW (ref 150–400)
Prothrombin Time: 23.8 seconds — ABNORMAL HIGH (ref 11.4–15.2)
Smear Review: NONE SEEN
aPTT: 58 seconds — ABNORMAL HIGH (ref 24–36)

## 2020-07-08 LAB — RENAL FUNCTION PANEL
Albumin: 2.9 g/dL — ABNORMAL LOW (ref 3.5–5.0)
Albumin: 3.1 g/dL — ABNORMAL LOW (ref 3.5–5.0)
Anion gap: 11 (ref 5–15)
Anion gap: 9 (ref 5–15)
BUN: 16 mg/dL (ref 6–20)
BUN: 18 mg/dL (ref 6–20)
CO2: 16 mmol/L — ABNORMAL LOW (ref 22–32)
CO2: 19 mmol/L — ABNORMAL LOW (ref 22–32)
Calcium: 7.5 mg/dL — ABNORMAL LOW (ref 8.9–10.3)
Calcium: 7.9 mg/dL — ABNORMAL LOW (ref 8.9–10.3)
Chloride: 112 mmol/L — ABNORMAL HIGH (ref 98–111)
Chloride: 113 mmol/L — ABNORMAL HIGH (ref 98–111)
Creatinine, Ser: 1.24 mg/dL (ref 0.61–1.24)
Creatinine, Ser: 1.42 mg/dL — ABNORMAL HIGH (ref 0.61–1.24)
GFR, Estimated: 58 mL/min — ABNORMAL LOW (ref >60)
GFR, Estimated: >60 mL/min (ref >60)
Glucose, Bld: 182 mg/dL — ABNORMAL HIGH (ref 70–99)
Glucose, Bld: 186 mg/dL — ABNORMAL HIGH (ref 70–99)
Phosphorus: 4.6 mg/dL (ref 2.5–4.6)
Phosphorus: 5 mg/dL — ABNORMAL HIGH (ref 2.5–4.6)
Potassium: 4.5 mmol/L (ref 3.5–5.1)
Potassium: 5.2 mmol/L — ABNORMAL HIGH (ref 3.5–5.1)
Sodium: 139 mmol/L (ref 135–145)
Sodium: 141 mmol/L (ref 135–145)

## 2020-07-08 LAB — PREPARE RBC (CROSSMATCH)

## 2020-07-08 LAB — COOXEMETRY PANEL
Carboxyhemoglobin: 0.9 % (ref 0.5–1.5)
Carboxyhemoglobin: 0.7 % (ref 0.5–1.5)
Methemoglobin: 1 % (ref 0.0–1.5)
Methemoglobin: 0.9 % (ref 0.0–1.5)
O2 Saturation: 44.3 %
O2 Saturation: 50.7 %
Total hemoglobin: 7.4 g/dL — ABNORMAL LOW (ref 12.0–16.0)
Total hemoglobin: 9.3 g/dL — ABNORMAL LOW (ref 12.0–16.0)

## 2020-07-08 LAB — SURGICAL PATHOLOGY

## 2020-07-08 LAB — LACTATE DEHYDROGENASE: LDH: 478 U/L — ABNORMAL HIGH (ref 98–192)

## 2020-07-08 LAB — APTT: aPTT: 60 seconds — ABNORMAL HIGH (ref 24–36)

## 2020-07-08 LAB — LACTIC ACID, PLASMA
Lactic Acid, Venous: 8 mmol/L (ref 0.5–1.9)
Lactic Acid, Venous: 3.8 mmol/L (ref 0.5–1.9)

## 2020-07-08 LAB — MAGNESIUM
Magnesium: 2.3 mg/dL (ref 1.7–2.4)
Magnesium: 2.7 mg/dL — ABNORMAL HIGH (ref 1.7–2.4)
Magnesium: 2.8 mg/dL — ABNORMAL HIGH (ref 1.7–2.4)

## 2020-07-08 LAB — HEPARIN LEVEL (UNFRACTIONATED): Heparin Unfractionated: <0.1 IU/mL — ABNORMAL LOW (ref 0.30–0.70)

## 2020-07-08 SURGERY — EXPLORATION POST OPERATIVE OPEN HEART
Anesthesia: General

## 2020-07-08 MED ORDER — TRAMADOL HCL 50 MG PO TABS: 50.0000 mg | ORAL_TABLET | ORAL | Status: DC | PRN

## 2020-07-08 MED ORDER — NITROGLYCERIN IN D5W 200-5 MCG/ML-% IV SOLN: 2.0000 ug/min | INTRAVENOUS | Status: DC

## 2020-07-08 MED ORDER — VASOPRESSIN 20 UNITS/100 ML INFUSION FOR SHOCK
0.0000 [IU]/min | INTRAVENOUS | Status: DC
  Filled 2020-07-08: qty 100

## 2020-07-08 MED ORDER — SODIUM CHLORIDE 0.9 % IV SOLN: INTRAVENOUS | Status: DC

## 2020-07-08 MED ORDER — SODIUM CHLORIDE 0.9% IV SOLUTION: Freq: Once | INTRAVENOUS | Status: DC

## 2020-07-08 MED ORDER — ALBUMIN HUMAN 5 % IV SOLN
12.5000 g | INTRAVENOUS | Status: DC | PRN
  Administered 2020-07-08 – 2020-07-12 (×6): 12.5 g via INTRAVENOUS
  Filled 2020-07-08 (×6): qty 250

## 2020-07-08 MED ORDER — TRANEXAMIC ACID (OHS) PUMP PRIME SOLUTION
2.0000 mg/kg | INTRAVENOUS | Status: DC
  Filled 2020-07-08: qty 1.42

## 2020-07-08 MED ORDER — TRANEXAMIC ACID 1000 MG/10ML IV SOLN
1.5000 mg/kg/h | INTRAVENOUS | Status: DC
  Filled 2020-07-08: qty 25

## 2020-07-08 MED ORDER — SODIUM BICARBONATE 8.4 % IV SOLN
50.0000 meq | Freq: Once | INTRAVENOUS | Status: AC
  Administered 2020-07-08: 50 meq via INTRAVENOUS

## 2020-07-08 MED ORDER — SODIUM CHLORIDE 0.9 % IV SOLN
1.5000 g | INTRAVENOUS | Status: AC
  Administered 2020-07-08: 1.5 g via INTRAVENOUS
  Filled 2020-07-08: qty 1.5

## 2020-07-08 MED ORDER — PANTOPRAZOLE SODIUM 40 MG PO PACK
40.0000 mg | PACK | Freq: Every day | ORAL | Status: DC
  Administered 2020-07-08 – 2020-07-13 (×6): 40 mg
  Filled 2020-07-08 (×6): qty 20

## 2020-07-08 MED ORDER — SODIUM CHLORIDE 0.9 % IV SOLN
1.0000 g | Freq: Once | INTRAVENOUS | Status: DC
  Filled 2020-07-08: qty 10

## 2020-07-08 MED ORDER — NOREPINEPHRINE 16 MG/250ML-% IV SOLN
0.0000 ug/min | INTRAVENOUS | Status: DC
  Administered 2020-07-08: 20 ug/min via INTRAVENOUS
  Administered 2020-07-08: 10 ug/min via INTRAVENOUS
  Administered 2020-07-09: 16 ug/min via INTRAVENOUS
  Administered 2020-07-10: 14 ug/min via INTRAVENOUS
  Administered 2020-07-11: 18 ug/min via INTRAVENOUS
  Administered 2020-07-12: 10 ug/min via INTRAVENOUS
  Administered 2020-07-12: 12 ug/min via INTRAVENOUS
  Administered 2020-07-13: 9 ug/min via INTRAVENOUS
  Filled 2020-07-08 (×7): qty 250

## 2020-07-08 MED ORDER — PLASMA-LYTE 148 IV SOLN: INTRAVENOUS | Status: DC

## 2020-07-08 MED ORDER — VANCOMYCIN HCL 1250 MG/250ML IV SOLN
1250.0000 mg | INTRAVENOUS | Status: AC
  Administered 2020-07-08: 1250 mg via INTRAVENOUS
  Filled 2020-07-08: qty 250

## 2020-07-08 MED ORDER — CLONAZEPAM 0.5 MG PO TABS
0.5000 mg | ORAL_TABLET | Freq: Three times a day (TID) | ORAL | Status: DC
  Administered 2020-07-08 – 2020-07-13 (×14): 0.5 mg
  Filled 2020-07-08 (×15): qty 1

## 2020-07-08 MED ORDER — SODIUM CHLORIDE 0.9% IV SOLUTION: Freq: Once | INTRAVENOUS | Status: AC

## 2020-07-08 MED ORDER — ONDANSETRON HCL 4 MG/2ML IJ SOLN
INTRAMUSCULAR | Status: AC
  Filled 2020-07-08: qty 4

## 2020-07-08 MED ORDER — HEMOSTATIC AGENTS (NO CHARGE) OPTIME
TOPICAL | Status: DC | PRN
  Administered 2020-07-08 (×3): 1 via TOPICAL

## 2020-07-08 MED ORDER — MILRINONE LACTATE IN DEXTROSE 20-5 MG/100ML-% IV SOLN: 0.3000 ug/kg/min | INTRAVENOUS | Status: DC

## 2020-07-08 MED ORDER — OXYCODONE HCL 5 MG PO TABS
5.0000 mg | ORAL_TABLET | ORAL | Status: DC | PRN
  Administered 2020-07-12 – 2020-07-14 (×7): 10 mg
  Filled 2020-07-08 (×7): qty 2

## 2020-07-08 MED ORDER — STERILE WATER FOR INJECTION IV SOLN
INTRAVENOUS | Status: DC
  Filled 2020-07-08 (×3): qty 850

## 2020-07-08 MED ORDER — VASOPRESSIN 20 UNIT/ML IV SOLN: 2.5000 [IU] | INTRAVENOUS | Status: DC

## 2020-07-08 MED ORDER — TRANEXAMIC ACID (OHS) BOLUS VIA INFUSION
15.0000 mg/kg | INTRAVENOUS | Status: DC
  Filled 2020-07-08: qty 1065

## 2020-07-08 MED ORDER — FENTANYL 2500MCG IN NS 250ML (10MCG/ML) PREMIX INFUSION
0.0000 ug/h | INTRAVENOUS | Status: DC
  Administered 2020-07-08: 200 ug/h via INTRAVENOUS
  Administered 2020-07-08: 400 ug/h via INTRAVENOUS
  Administered 2020-07-09 (×2): 325 ug/h via INTRAVENOUS
  Administered 2020-07-10: 275 ug/h via INTRAVENOUS
  Administered 2020-07-10: 350 ug/h via INTRAVENOUS
  Administered 2020-07-10: 325 ug/h via INTRAVENOUS
  Administered 2020-07-11 (×2): 375 ug/h via INTRAVENOUS
  Administered 2020-07-11: 200 ug/h via INTRAVENOUS
  Administered 2020-07-11: 315 ug/h via INTRAVENOUS
  Administered 2020-07-12: 175 ug/h via INTRAVENOUS
  Administered 2020-07-12: 300 ug/h via INTRAVENOUS
  Administered 2020-07-13: 250 ug/h via INTRAVENOUS
  Filled 2020-07-08 (×17): qty 250

## 2020-07-08 MED ORDER — SODIUM BICARBONATE 8.4 % IV SOLN
INTRAVENOUS | Status: DC | PRN
  Administered 2020-07-08: 50 meq via INTRAVENOUS

## 2020-07-08 MED ORDER — NOREPINEPHRINE 4 MG/250ML-% IV SOLN
0.0000 ug/min | INTRAVENOUS | Status: DC
  Administered 2020-07-08: 18 ug/min via INTRAVENOUS
  Administered 2020-07-08: 5 ug/min via INTRAVENOUS
  Filled 2020-07-08: qty 250

## 2020-07-08 MED ORDER — TRAZODONE HCL 50 MG PO TABS
100.0000 mg | ORAL_TABLET | Freq: Every day | ORAL | Status: DC
  Administered 2020-07-08: 100 mg via ORAL
  Filled 2020-07-08: qty 2

## 2020-07-08 MED ORDER — SODIUM CHLORIDE (PF) 0.9 % IJ SOLN
OROMUCOSAL | Status: DC | PRN
  Administered 2020-07-08 (×2): 4 mL via TOPICAL

## 2020-07-08 MED ORDER — ACETAMINOPHEN 500 MG PO TABS: 1000.0000 mg | ORAL_TABLET | Freq: Once | ORAL | Status: DC

## 2020-07-08 MED ORDER — FUROSEMIDE 10 MG/ML IJ SOLN
20.0000 mg | Freq: Once | INTRAMUSCULAR | Status: AC
  Administered 2020-07-08: 20 mg via INTRAVENOUS
  Filled 2020-07-08: qty 2

## 2020-07-08 MED ORDER — CALCIUM CHLORIDE 10 % IV SOLN
INTRAVENOUS | Status: AC
  Administered 2020-07-08: 1000 mg
  Filled 2020-07-08: qty 10

## 2020-07-08 MED ORDER — CALCIUM CHLORIDE 10 % IV SOLN
1.0000 g | Freq: Once | INTRAVENOUS | Status: AC
  Administered 2020-07-08: 1 g via INTRAVENOUS

## 2020-07-08 MED ORDER — SODIUM CHLORIDE 0.9 % IV SOLN
750.0000 mg | INTRAVENOUS | Status: DC
  Filled 2020-07-08: qty 750

## 2020-07-08 MED ORDER — SODIUM CHLORIDE 0.9 % IR SOLN
Status: DC | PRN
  Administered 2020-07-08: 5000 mL

## 2020-07-08 MED ORDER — TRAZODONE HCL 100 MG PO TABS
100.0000 mg | ORAL_TABLET | Freq: Every day | ORAL | Status: DC
  Administered 2020-07-09 – 2020-07-16 (×7): 100 mg
  Filled 2020-07-08 (×7): qty 2

## 2020-07-08 MED ORDER — DOCUSATE SODIUM 50 MG/5ML PO LIQD
200.0000 mg | Freq: Every day | ORAL | Status: DC
  Administered 2020-07-08 – 2020-07-13 (×6): 200 mg
  Administered 2020-07-14: 100 mg
  Filled 2020-07-08 (×8): qty 20

## 2020-07-08 MED ORDER — MAGNESIUM SULFATE 50 % IJ SOLN
40.0000 meq | INTRAMUSCULAR | Status: DC
  Filled 2020-07-08: qty 9.85

## 2020-07-08 MED ORDER — SODIUM CHLORIDE 0.9 % IV SOLN: INTRAVENOUS | Status: DC | PRN

## 2020-07-08 MED ORDER — VASOPRESSIN 20 UNIT/ML IV SOLN
INTRAVENOUS | Status: AC
  Filled 2020-07-08: qty 1

## 2020-07-08 MED ORDER — LACTATED RINGERS IV SOLN: INTRAVENOUS | Status: DC | PRN

## 2020-07-08 MED ORDER — INSULIN REGULAR(HUMAN) IN NACL 100-0.9 UT/100ML-% IV SOLN: INTRAVENOUS | Status: DC

## 2020-07-08 MED ORDER — ALBUMIN HUMAN 5 % IV SOLN
12.5000 g | Freq: Once | INTRAVENOUS | Status: AC
  Administered 2020-07-08: 12.5 g via INTRAVENOUS
  Filled 2020-07-08: qty 250

## 2020-07-08 MED ORDER — EPINEPHRINE HCL 5 MG/250ML IV SOLN IN NS: 0.0000 ug/min | INTRAVENOUS | Status: DC

## 2020-07-08 MED ORDER — MIDAZOLAM HCL 2 MG/2ML IJ SOLN
INTRAMUSCULAR | Status: AC
  Filled 2020-07-08: qty 2

## 2020-07-08 MED ORDER — NOREPINEPHRINE 4 MG/250ML-% IV SOLN: 0.0000 ug/min | INTRAVENOUS | Status: DC

## 2020-07-08 MED ORDER — PHENYLEPHRINE HCL-NACL 20-0.9 MG/250ML-% IV SOLN
30.0000 ug/min | INTRAVENOUS | Status: DC
Start: 2020-07-08 — End: 2020-07-08

## 2020-07-08 MED ORDER — MIDAZOLAM HCL 5 MG/5ML IJ SOLN
INTRAMUSCULAR | Status: DC | PRN
  Administered 2020-07-08: 2 mg via INTRAVENOUS

## 2020-07-08 MED ORDER — DEXMEDETOMIDINE HCL IN NACL 400 MCG/100ML IV SOLN: 0.1000 ug/kg/h | INTRAVENOUS | Status: DC

## 2020-07-08 MED ORDER — ROCURONIUM BROMIDE 10 MG/ML (PF) SYRINGE
PREFILLED_SYRINGE | INTRAVENOUS | Status: DC | PRN
  Administered 2020-07-08 (×2): 100 mg via INTRAVENOUS

## 2020-07-08 MED ORDER — POTASSIUM CHLORIDE 2 MEQ/ML IV SOLN
80.0000 meq | INTRAVENOUS | Status: DC
  Filled 2020-07-08: qty 40

## 2020-07-08 MED ORDER — ATORVASTATIN CALCIUM 80 MG PO TABS
80.0000 mg | ORAL_TABLET | Freq: Every day | ORAL | Status: DC
  Administered 2020-07-09 – 2020-07-21 (×13): 80 mg
  Filled 2020-07-08 (×13): qty 1

## 2020-07-08 MED FILL — Sodium Chloride IV Soln 0.9%: INTRAVENOUS | Qty: 2000 | Status: AC

## 2020-07-08 MED FILL — Mannitol IV Soln 20%: INTRAVENOUS | Qty: 500 | Status: AC

## 2020-07-08 MED FILL — Potassium Chloride Inj 2 mEq/ML: INTRAVENOUS | Qty: 40 | Status: AC

## 2020-07-08 MED FILL — Heparin Sodium (Porcine) Inj 1000 Unit/ML: INTRAMUSCULAR | Qty: 10 | Status: AC

## 2020-07-08 MED FILL — Lidocaine HCl Local Soln Prefilled Syringe 100 MG/5ML (2%): INTRAMUSCULAR | Qty: 5 | Status: AC

## 2020-07-08 MED FILL — Electrolyte-R (PH 7.4) Solution: INTRAVENOUS | Qty: 6000 | Status: AC

## 2020-07-08 MED FILL — Albumin, Human Inj 5%: INTRAVENOUS | Qty: 250 | Status: AC

## 2020-07-08 MED FILL — Heparin Sodium (Porcine) Inj 1000 Unit/ML: INTRAMUSCULAR | Qty: 30 | Status: AC

## 2020-07-08 MED FILL — Sodium Bicarbonate IV Soln 8.4%: INTRAVENOUS | Qty: 50 | Status: AC

## 2020-07-08 MED FILL — Magnesium Sulfate Inj 50%: INTRAMUSCULAR | Qty: 10 | Status: AC

## 2020-07-08 SURGICAL SUPPLY — 99 items
ADAPTER CARDIO PERF ANTE/RETRO (ADAPTER) IMPLANT
ADPR PRFSN 84XANTGRD RTRGD (ADAPTER)
APPLIER CLIP 9.375 MED OPEN (MISCELLANEOUS)
APPLIER CLIP 9.375 SM OPEN (CLIP)
APR CLP MED 9.3 20 MLT OPN (MISCELLANEOUS)
APR CLP SM 9.3 20 MLT OPN (CLIP)
BAG DECANTER FOR FLEXI CONT (MISCELLANEOUS) ×2 IMPLANT
BLADE CLIPPER SURG (BLADE) ×2 IMPLANT
BLADE SURG 11 STRL SS (BLADE) IMPLANT
CANISTER SUCT 3000ML PPV (MISCELLANEOUS) ×2 IMPLANT
CANNULA GUNDRY RCSP 15FR (MISCELLANEOUS) IMPLANT
CATH THORACIC 28FR (CATHETERS) IMPLANT
CLIP APPLIE 9.375 MED OPEN (MISCELLANEOUS) IMPLANT
CLIP APPLIE 9.375 SM OPEN (CLIP) IMPLANT
CLIP FOGARTY SPRING 6M (CLIP) IMPLANT
CLIP VESOCCLUDE MED 24/CT (CLIP) IMPLANT
CLIP VESOCCLUDE MED 6/CT (CLIP) IMPLANT
CLIP VESOCCLUDE SM WIDE 24/CT (CLIP) IMPLANT
CONN ST 1/4X3/8  BEN (MISCELLANEOUS) ×2
CONN ST 1/4X3/8 BEN (MISCELLANEOUS) IMPLANT
CONN Y 3/8X3/8X3/8  BEN (MISCELLANEOUS)
CONN Y 3/8X3/8X3/8 BEN (MISCELLANEOUS) IMPLANT
COVER SURGICAL LIGHT HANDLE (MISCELLANEOUS) IMPLANT
DRAIN CHANNEL 28F RND 3/8 FF (WOUND CARE) ×1 IMPLANT
DRAPE CARDIOVASCULAR INCISE (DRAPES) ×2
DRAPE SRG 135X102X78XABS (DRAPES) ×1 IMPLANT
DRSG AQUACEL AG ADV 3.5X14 (GAUZE/BANDAGES/DRESSINGS) ×1 IMPLANT
DRSG COVADERM 4X14 (GAUZE/BANDAGES/DRESSINGS) ×2 IMPLANT
ELECT CAUTERY BLADE 6.4 (BLADE) IMPLANT
ELECT REM PT RETURN 9FT ADLT (ELECTROSURGICAL) ×4
ELECTRODE REM PT RTRN 9FT ADLT (ELECTROSURGICAL) ×2 IMPLANT
FELT TEFLON 1X6 (MISCELLANEOUS) ×1 IMPLANT
GAUZE SPONGE 4X4 12PLY STRL (GAUZE/BANDAGES/DRESSINGS) ×3 IMPLANT
GAUZE XEROFORM 1X8 LF (GAUZE/BANDAGES/DRESSINGS) ×1 IMPLANT
GLOVE BIO SURGEON STRL SZ 6 (GLOVE) IMPLANT
GLOVE BIO SURGEON STRL SZ 6.5 (GLOVE) ×6 IMPLANT
GLOVE BIO SURGEON STRL SZ7 (GLOVE) IMPLANT
GLOVE BIO SURGEON STRL SZ7.5 (GLOVE) IMPLANT
GLOVE SKINSENSE NS SZ6.5 (GLOVE)
GLOVE SKINSENSE STRL SZ6.5 (GLOVE) IMPLANT
GLOVE SURG SIGNA 7.5 PF LTX (GLOVE) ×4 IMPLANT
GOWN STRL REUS W/ TWL LRG LVL3 (GOWN DISPOSABLE) ×3 IMPLANT
GOWN STRL REUS W/ TWL XL LVL3 (GOWN DISPOSABLE) ×2 IMPLANT
GOWN STRL REUS W/TWL LRG LVL3 (GOWN DISPOSABLE) ×10
GOWN STRL REUS W/TWL XL LVL3 (GOWN DISPOSABLE) ×2
HEMOSTAT SURGICEL 2X14 (HEMOSTASIS) IMPLANT
INSERT FOGARTY XLG (MISCELLANEOUS) IMPLANT
KIT BASIN OR (CUSTOM PROCEDURE TRAY) ×2 IMPLANT
KIT SUCTION CATH 14FR (SUCTIONS) ×1 IMPLANT
KIT TURNOVER KIT B (KITS) ×2 IMPLANT
LINE VENT (MISCELLANEOUS) IMPLANT
NS IRRIG 1000ML POUR BTL (IV SOLUTION) ×10 IMPLANT
PACK CHEST (CUSTOM PROCEDURE TRAY) ×1 IMPLANT
PACK E OPEN HEART (SUTURE) ×1 IMPLANT
PACK OPEN HEART (CUSTOM PROCEDURE TRAY) ×1 IMPLANT
PAD ARMBOARD 7.5X6 YLW CONV (MISCELLANEOUS) ×4 IMPLANT
PAD ELECT DEFIB RADIOL ZOLL (MISCELLANEOUS) ×2 IMPLANT
POSITIONER HEAD DONUT 9IN (MISCELLANEOUS) ×2 IMPLANT
POWDER SURGICEL 3.0 GRAM (HEMOSTASIS) ×1 IMPLANT
SET CARDIOPLEGIA MPS 5001102 (MISCELLANEOUS) IMPLANT
SPONGE LAP 18X18 RF (DISPOSABLE) IMPLANT
SPONGE LAP 4X18 RFD (DISPOSABLE) IMPLANT
STAPLER VISISTAT 35W (STAPLE) ×2 IMPLANT
SUT BONE WAX W31G (SUTURE) ×1 IMPLANT
SUT ETHIBOND 2 0 SH (SUTURE)
SUT ETHIBOND 2 0 SH 36X2 (SUTURE) IMPLANT
SUT MNCRL AB 4-0 PS2 18 (SUTURE) IMPLANT
SUT PROLENE 3 0 SH 1 (SUTURE) IMPLANT
SUT PROLENE 3 0 SH DA (SUTURE) ×1 IMPLANT
SUT PROLENE 4 0 RB 1 (SUTURE) ×2
SUT PROLENE 4 0 SH DA (SUTURE) ×2 IMPLANT
SUT PROLENE 4-0 RB1 .5 CRCL 36 (SUTURE) IMPLANT
SUT PROLENE 5 0 C 1 36 (SUTURE) ×4 IMPLANT
SUT PROLENE 6 0 C 1 30 (SUTURE) IMPLANT
SUT PROLENE 7 0 BV 1 (SUTURE) ×4 IMPLANT
SUT PROLENE 7 0 BV1 MDA (SUTURE) IMPLANT
SUT PROLENE 8 0 BV175 6 (SUTURE) IMPLANT
SUT SILK  1 MH (SUTURE)
SUT SILK 1 MH (SUTURE) ×1 IMPLANT
SUT STEEL 6MS V (SUTURE) ×1 IMPLANT
SUT STEEL STERNAL CCS#1 18IN (SUTURE) IMPLANT
SUT STEEL SZ 6 DBL 3X14 BALL (SUTURE) ×1 IMPLANT
SUT VIC AB 1 CTX 36 (SUTURE) ×4
SUT VIC AB 1 CTX36XBRD ANBCTR (SUTURE) ×2 IMPLANT
SUT VIC AB 2-0 CT1 36 (SUTURE) IMPLANT
SUT VIC AB 2-0 CTX 27 (SUTURE) ×4 IMPLANT
SUT VIC AB 3-0 SH 27 (SUTURE)
SUT VIC AB 3-0 SH 27X BRD (SUTURE) IMPLANT
SUT VIC AB 3-0 X1 27 (SUTURE) ×4 IMPLANT
SUT VICRYL 4-0 PS2 18IN ABS (SUTURE) IMPLANT
SYR BULB IRRIG 60ML STRL (SYRINGE) ×1 IMPLANT
SYSTEM SAHARA CHEST DRAIN ATS (WOUND CARE) IMPLANT
TAPE CLOTH SURG 4X10 WHT LF (GAUZE/BANDAGES/DRESSINGS) ×1 IMPLANT
TAPE PAPER 2X10 WHT MICROPORE (GAUZE/BANDAGES/DRESSINGS) ×1 IMPLANT
TOWEL GREEN STERILE (TOWEL DISPOSABLE) ×2 IMPLANT
TRAY CATH LUMEN 1 20CM STRL (SET/KITS/TRAYS/PACK) IMPLANT
TUBING LAP HI FLOW INSUFFLATIO (TUBING) IMPLANT
UNDERPAD 30X36 HEAVY ABSORB (UNDERPADS AND DIAPERS) ×1 IMPLANT
WATER STERILE IRR 1000ML POUR (IV SOLUTION) ×4 IMPLANT

## 2020-07-08 NOTE — H&P (View-Only) (Signed)
Patient ID: Jerry [name], male   DOB: [birth year], 49 y.o.   MRN: [id number] P    Advanced Heart Failure Rounding Note  PCP-Cardiologist: Sherren Mocha, MD    Patient Profile   49 y/o male s/p recent back surgery, admitted for acute inferior and anterolateral MI. LHC showed thrombotic occlusion of distal RCA that was treated with PTCA and thrombectomy.  He was also noted to have chronic total occlusion of the LAD with collaterals from the right (thus this territory was affected by the RCA MI) as well as 90% complex proximal LCx stenosis. EF 25-30% + apical thrombus. RV normal. Course b/c cardiogenic shock and acute hypoxic respiratory failure. ? Component of septic shock.    Subjective:    CABG + MV repair and placement of Impella 5.5 on 10/15.   Overnight, increasing pressor requirement with suction alarms on Impella (turned down).   Echo done this morning, showing Impella position too close to the posterior wall, no pericardial effusion, large right atrial thrombus.  RV function appeared only mildly hypokinetic with normal RV size, LV EF in the 20-25% range.   He remains on milrinone 0.3, epinephrine 20, norepinephrine 14.  HCO3 low at 16, creatinine 1.4.  He is getting heparin only in purge currently.  He is in NSR on IV amiodarone.   Swan numbers: CVP 22 PA 25/18 CI 2.1  Impella 5.5 P7 with flow 3.5 L/min LDH 428  Objective:   Weight Range: 71 kg Body mass index is 23.8 kg/m.   Vital Signs:   Temp:  [98.6 F (37 C)-101.5 F (38.6 C)] 99.7 F (37.6 C) (10/14 1000) Pulse Rate:  [73-107] 87 (10/14 1000) Resp:  [14-41] 19 (10/14 1000) BP: (63-130)/(44-108) 98/74 (10/14 1000) SpO2:  [93 %-100 %] 100 % (10/14 1000) Arterial Line BP: (59-133)/(49-104) 94/71 (10/14 1000) FiO2 (%):  [50 %-60 %] 50 % (10/14 0743) Last BM Date: 07/06/20  Weight change: Filed Weights   07/05/20 0300 07/06/20 0403 07/07/20 0618  Weight: 73.7 kg 71.4 kg 71 kg     Intake/Output:   Intake/Output Summary (Last 24 hours) at 07/08/2020 1047 Last data filed at 07/08/2020 1000 Gross per 24 hour  Intake 9387.31 ml  Output 4805 ml  Net 4582.31 ml      Physical Exam   General: Sedated on vent.  Neck: JVP 16+, no thyromegaly or thyroid nodule.  Lungs: Decreased at bases.  CV: Nondisplaced PMI.  Heart regular S1/S2, no S3/S4, Impella sounds.  Trace ankle edema.  Abdomen: Soft, nontender, no hepatosplenomegaly, no distention.  Skin: Intact without lesions or rashes.  Neurologic: Sedated on vent.  Extremities: No clubbing or cyanosis.  HEENT: Normal.    Telemetry   NSR 80s Personally reviewed  Labs    CBC Recent Labs    07/08/20 0013 07/08/20 0013 07/08/20 0412 07/08/20 0412 07/08/20 0422 07/08/20 0619  WBC 24.7*  --  21.2*  --   --   --   NEUTROABS 21.1*  --  17.5*  --   --   --   HGB 8.0*   < > 7.2*   < > 6.8* 7.5*  HCT 25.4*   < > 23.3*   < > 20.0* 22.0*  MCV 92.7  --  92.5  --   --   --   PLT 161  --  155  --   --   --    < > = values in this interval not displayed.   Basic Metabolic Panel Recent  Labs    07/08/20 0013 07/08/20 0013 07/08/20 0412 07/08/20 0412 07/08/20 0422 07/08/20 0619  NA 141   < > 139   < > 142 141  K 5.2*   < > 4.5   < > 4.5 4.4  CL 113*  --  112*  --   --   --   CO2 19*  --  16*  --   --   --   GLUCOSE 182*  --  186*  --   --   --   BUN 16  --  18  --   --   --   CREATININE 1.24  --  1.42*  --   --   --   CALCIUM 7.9*  --  7.5*  --   --   --   MG 2.8*  --  2.7*  --   --   --   PHOS 5.0*  --  4.6  --   --   --    < > = values in this interval not displayed.   Liver Function Tests Recent Labs    07/07/20 0120 07/07/20 0120 07/08/20 0013 07/08/20 0412  AST 36  --   --   --   ALT 43  --   --   --   ALKPHOS 55  --   --   --   BILITOT 0.5  --   --   --   PROT 6.3*  --   --   --   ALBUMIN 2.7*   < > 3.1* 2.9*   < > = values in this interval not displayed.   No results for input(s):  LIPASE, AMYLASE in the last 72 hours. Cardiac Enzymes No results for input(s): CKTOTAL, CKMB, CKMBINDEX, TROPONINI in the last 72 hours.  BNP: BNP (last 3 results) Recent Labs    06/25/20 2334 06/26/20 0150  BNP 349.1* 368.4*    ProBNP (last 3 results) No results for input(s): PROBNP in the last 8760 hours.   D-Dimer No results for input(s): DDIMER in the last 72 hours. Hemoglobin A1C Recent Labs    07/07/20 0120  HGBA1C 5.4   Fasting Lipid Panel No results for input(s): CHOL, HDL, LDLCALC, TRIG, CHOLHDL, LDLDIRECT in the last 72 hours. Thyroid Function Tests No results for input(s): TSH, T4TOTAL, T3FREE, THYROIDAB in the last 72 hours.  Invalid input(s): FREET3  Other results:   Imaging    DG Chest Port 1 View  Result Date: 07/08/2020 CLINICAL DATA:  Status post coronary bypass graft. EXAM: PORTABLE CHEST 1 VIEW COMPARISON:  July 07, 2020. FINDINGS: Stable cardiomediastinal silhouette. Endotracheal and nasogastric tubes are unchanged in position. Right internal jugular Swan-Ganz catheter is unchanged. Impella device is unchanged. Bilateral chest tubes are noted without pneumothorax. No significant pleural effusion is noted. Improved bilateral pulmonary edema is noted. Bony thorax is unremarkable. IMPRESSION: Stable support apparatus. No pneumothorax is noted. Improved bilateral pulmonary edema is noted. Electronically Signed   By: Marijo Conception M.D.   On: 07/08/2020 08:13   DG Chest Port 1 View  Result Date: 07/07/2020 CLINICAL DATA:  Status post coronary artery bypass grafting EXAM: PORTABLE CHEST 1 VIEW COMPARISON:  July 07, 2020 study obtained earlier in the day FINDINGS: Endotracheal tube tip is 2.9 cm above the carina. Nasogastric tube tip is in the proximal stomach with the side port at the gastroesophageal junction. Swan-Ganz catheter tip is in the proximal right main pulmonary artery. There is an Impella device  in the right ventricle region. Temporary  pacemaker wires are attached to the heart. There is a chest tube on each side and a mediastinal drain present. Left subclavian catheter tip is in the superior vena cava. There is no appreciable pneumothorax. There is postoperative change in the left apex with surgical clips. There is apical pleural thickening on the left. There is mild subcutaneous air in the supraclavicular region on the left. There are surgical clips in the right axillary region. There is evidence of prior trauma with screw and plate fixation in the right clavicle. There is mild left base atelectasis with minimal left pleural effusion. There is also atelectatic change in the left upper lobe. There is ill-defined opacity in portions of the right mid and lower lung regions which may represent mild pulmonary edema. No consolidation. Stable cardiac silhouette. IMPRESSION: Tube and catheter positions as described without pneumothorax. Note that the nasogastric tube side port is at the gastroesophageal junction. It may be prudent to advance nasogastric tube 4-5 cm. No evident pneumothorax. Mild air in the left supraclavicular region noted. Suspect a degree of underlying interstitial pulmonary edema. No consolidation. There is mild left base atelectasis with minimal left pleural effusion. There is left upper lobe atelectasis as well. Heart upper normal in size. Electronically Signed   By: Lowella Grip III M.D.   On: 07/07/2020 19:05   DG C-Arm 1-60 Min-No Report  Result Date: 07/07/2020 Fluoroscopy was utilized by the requesting physician.  No radiographic interpretation.     Medications:     Scheduled Medications: . sodium chloride   Intravenous Once  . acetaminophen  1,000 mg Oral Q6H   Or  . acetaminophen (TYLENOL) oral liquid 160 mg/5 mL  1,000 mg Per Tube Q6H  . aspirin EC  325 mg Oral Daily   Or  . aspirin  324 mg Per Tube Daily  . [START ON 07/09/2020] atorvastatin  80 mg Per Tube Daily  . bisacodyl  10 mg Oral Daily    Or  . bisacodyl  10 mg Rectal Daily  . chlorhexidine gluconate (MEDLINE KIT)  15 mL Mouth Rinse BID  . Chlorhexidine Gluconate Cloth  6 each Topical Daily  . clonazePAM  0.5 mg Per Tube TID BM  . docusate  200 mg Per Tube Daily  . mouth rinse  15 mL Mouth Rinse 10 times per day  . metoprolol tartrate  12.5 mg Oral BID   Or  . metoprolol tartrate  12.5 mg Per Tube BID  . [START ON 07/09/2020] pantoprazole sodium  40 mg Per Tube Daily  . sodium chloride flush  10-40 mL Intracatheter Q12H  . sodium chloride flush  3 mL Intravenous Q12H    Infusions: . sodium chloride 20 mL/hr at 07/08/20 1000  . sodium chloride    . sodium chloride    . albumin human 12.5 g (07/08/20 1033)  . amiodarone 30 mg/hr (07/08/20 1000)  . cefUROXime (ZINACEF)  IV Stopped (07/08/20 8676)  . dexmedetomidine (PRECEDEX) IV infusion 1.2 mcg/kg/hr (07/08/20 1000)  . epinephrine 20 mcg/min (07/08/20 1034)  . fentaNYL infusion INTRAVENOUS 300 mcg/hr (07/08/20 1000)  . impella catheter heparin 25 unit/mL in dextrose 5%    . insulin 6 mL/hr at 07/08/20 1000  . lactated ringers    . lactated ringers    . lactated ringers 20 mL/hr at 07/08/20 1000  . milrinone 0.3 mcg/kg/min (07/08/20 1000)  . nitroGLYCERIN Stopped (07/07/20 2047)  . norepinephrine (LEVOPHED) Adult infusion 12 mcg/min (07/08/20 1000)  .  phenylephrine (NEO-SYNEPHRINE) Adult infusion 30 mcg/min (07/08/20 1000)  . vancomycin      PRN Medications: sodium chloride, albumin human, dextrose, lactated ringers, LORazepam, metoprolol tartrate, midazolam, morphine injection, ondansetron (ZOFRAN) IV, oxyCODONE, sodium chloride flush, sodium chloride flush, traMADol    Assessment/Plan   1. CAD: Late presentation inferior MI.  Cath showed thrombotic occlusion distal RCA, CTO LAD with collaterals from right, and complex 90% proximal LCx stenosis.  He had PTCA/thrombectomy RCA with good flow at end of procedure.  Suspect he had damage to LAD territory as well  as RCA territory as RCA collateralized the LAD.  Now s/p CABG with LIMA-LAD, left radial to OM, seq SVG-PDA/PLV - ASA/statin.  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 Suspect post-MI pericarditis. Symptoms resolved prior to surgery.   3. Acute systolic CHF>>Cardiogenic Shock: Echo with EF 25-30%, RV ok (no evidence for RV infarct), LV thrombus.  Repeat bedside echo 06/27/20  EF 25%, normal RV w/ no pericardial effusion, no LV thrombus seen on this echo. Now with post-op shock, has Impella 5.5 in place.  He is on epinephrine 20, norepinephrine 14, milrinone 0.3.  CVP high at 22 in setting of large right atrial thrombus, appears to be causing obstruction to RV inflow.  Impella position re-adjusted today, now up to P7  - Swan removed due to large RA thrombus, he has a PICC line to follow CVP and co-ox.  - Thrombus is creating obstruction to RV inflow, will need to be dealt with => start therapeutic heparin gtt when ok with TCTS (when chest tube output lower hopefully this afternoon).  Going back to OR would be difficult/very high risk, discussing with Dr. Roxan Hockey, will do TEE today to confirm clot.  - Continue current inotropes/pressors, wean as able.  - Flotrak not useful without pulsatile flow.  Will try to follow CVP/co-ox off Swan though RV inflow obstruction will affect readings.  4. AF with RVR: Noted 10/12, now in NSR on amiodarone.  - Continue amiodarone gtt.  5. LV thrombus: Resolved on 10/3 echo.  - Restart heparin as soon as feasible as above.  6. ID: Afebrile now.  PCT was initially mildly elevated 0.96. This may be effect of acute MI with post-infarct pericarditis but treated empirically w/ broad spectrum abx, vanc + cefepime. Possible RLL PNA on CXR.  Blood cultures NGTD, sputum cultures unremarkable. Completed 7 days vancomycin/cefepime.  Post-op low grade fever, follow.  8. S/p back surgery:  Micro-discectomy just prior to admission.  Neurosurgery following, appreciated.  9. Possible benzo/narcotic withdrawal: Concern GF brought him drugs.  10. RA thrombus: Not seen on intra-op TEE, noted on echo this morning.  - Swan removed.  - Start heparin gtt when able to do so.  11. Anemia: Post-op blood loss.  - Transfuse to keep hgb > 8.   CRITICAL CARE Performed by: Loralie Champagne  Total critical care time: 55 minutes  Critical care time was exclusive of separately billable procedures and treating other patients.  Critical care was necessary to treat or prevent imminent or life-threatening deterioration.  Critical care was time spent personally by me on the following activities: development of treatment plan with patient and/or surrogate as well as nursing, discussions with consultants, evaluation of patient's response to treatment, examination of patient, obtaining history from patient or surrogate, ordering and performing treatments and interventions, ordering and review of laboratory studies, ordering and review of radiographic studies,  pulse oximetry and re-evaluation of patient's condition.   Loralie Champagne, MD 07/08/2020 10:47 AM

## 2020-07-08 NOTE — Progress Notes (Signed)
  Echocardiogram 2D Echocardiogram limited has been performed.  Leta Jungling M 07/08/2020, 8:59 AM

## 2020-07-08 NOTE — Progress Notes (Signed)
1 Day Post-Op Procedure(s) (LRB): CORONARY ARTERY BYPASS GRAFTING (CABG) x FOUR, USING LEFT RADIAL ARTERY AND RIGHT LEG GREATER SAPHENOUS VEIN HARVESTED ENDOSCOPICALLY (N/A) RADIAL ARTERY HARVEST left (Left) PLACEMENT OF IMPELLA LEFT VENTRICULAR ASSIST DEVICE USING ABIOMED IMPELLA 5.5 (Right) TRANSESOPHAGEAL ECHOCARDIOGRAM (TEE) (N/A) ENDOVEIN HARVEST OF GREATER SAPHENOUS VEIN (Right) Subjective: Intubated, sedated  Objective: Vital signs in last 24 hours: Temp:  [98.6 F (37 C)-101.5 F (38.6 C)] 99.7 F (37.6 C) (10/14 1000) Pulse Rate:  [73-107] 83 (10/14 1112) Cardiac Rhythm: Normal sinus rhythm (10/14 0800) Resp:  [14-41] 21 (10/14 1112) BP: (63-130)/(44-108) 96/74 (10/14 1100) SpO2:  [93 %-100 %] 100 % (10/14 1112) Arterial Line BP: (59-133)/(49-104) 94/71 (10/14 1000) FiO2 (%):  [50 %-60 %] 50 % (10/14 1112)  Hemodynamic parameters for last 24 hours: PAP: (18-40)/(-10-26) 25/18 CVP:  [12 mmHg-26 mmHg] 26 mmHg PCWP:  [21 mmHg-27 mmHg] 21 mmHg CO:  [3.2 L/min-3.8 L/min] 3.8 L/min CI:  [1.7 L/min/m2-2.1 L/min/m2] 2.1 L/min/m2  Intake/Output from previous day: 10/13 0701 - 10/14 0700 In: 8630 [I.V.:5311.8; Blood:1831.7; IV Piggyback:1378.7] Out: 4755 [Urine:2015; Blood:1375; Chest Tube:1365] Intake/Output this shift: Total I/O In: 1107.3 [I.V.:772.7; Blood:315; Other:19.6] Out: 250 [Urine:140; Chest Tube:110]  General appearance: intubated Neurologic: seatt Heart: regular rate and rhythm Lungs: clear to auscultation bilaterally Abdomen: normal findings: soft, non-tender Extremities: cool  Lab Results: Recent Labs    07/08/20 0013 07/08/20 0013 07/08/20 0412 07/08/20 0412 07/08/20 0422 07/08/20 0619  WBC 24.7*  --  21.2*  --   --   --   HGB 8.0*   < > 7.2*   < > 6.8* 7.5*  HCT 25.4*   < > 23.3*   < > 20.0* 22.0*  PLT 161  --  155  --   --   --    < > = values in this interval not displayed.   BMET:  Recent Labs    07/08/20 0013 07/08/20 0013  07/08/20 0412 07/08/20 0412 07/08/20 0422 07/08/20 0619  NA 141   < > 139   < > 142 141  K 5.2*   < > 4.5   < > 4.5 4.4  CL 113*  --  112*  --   --   --   CO2 19*  --  16*  --   --   --   GLUCOSE 182*  --  186*  --   --   --   BUN 16  --  18  --   --   --   CREATININE 1.24  --  1.42*  --   --   --   CALCIUM 7.9*  --  7.5*  --   --   --    < > = values in this interval not displayed.    PT/INR:  Recent Labs    07/08/20 0013  LABPROT 23.0*  INR 2.1*   ABG    Component Value Date/Time   PHART 7.320 (L) 07/08/2020 0619   HCO3 11.0 (L) 07/08/2020 0619   TCO2 12 (L) 07/08/2020 0619   ACIDBASEDEF 13.0 (H) 07/08/2020 0619   O2SAT 100.0 07/08/2020 0619   CBG (last 3)  Recent Labs    07/08/20 0801 07/08/20 0939 07/08/20 1106  GLUCAP 151* 163* 166*    Assessment/Plan: S/P Procedure(s) (LRB): CORONARY ARTERY BYPASS GRAFTING (CABG) x FOUR, USING LEFT RADIAL ARTERY AND RIGHT LEG GREATER SAPHENOUS VEIN HARVESTED ENDOSCOPICALLY (N/A) RADIAL ARTERY HARVEST left (Left) PLACEMENT OF IMPELLA LEFT VENTRICULAR ASSIST DEVICE USING ABIOMED  IMPELLA 5.5 (Right) TRANSESOPHAGEAL ECHOCARDIOGRAM (TEE) (N/A) ENDOVEIN HARVEST OF GREATER SAPHENOUS VEIN (Right) -POD #1 CV- Impella functioning, repositioned slightly by Dr. McLean earlier  CVP elevated despite low PA pressures  ECHO earlier showed possible RA thrombus, appears to be in RA no pericardial  Unusual finding in setting of early postop and coagulopathy even with foreign body (Swan)  Will check TEE to see if we can better assess RESP - remains intubated. CXR improved from preop RENAL- creatinine up c/w AKI  Monitor UO, follow labs ENDO- CBG OK Anemia secondary to ABL- transfused earlier Keep CT in place Overall remains critically ill  LOS: 13 days     C  07/08/2020  

## 2020-07-08 NOTE — Interval H&P Note (Signed)
History and Physical Interval Note:  07/08/2020 12:26 PM  Darryl Lent  has presented today for surgery, with the diagnosis of Coronary Artery Disease.  The various methods of treatment have been discussed with the patient and family. After consideration of risks, benefits and other options for treatment, the patient has consented to  Procedure(s): CORONARY ARTERY BYPASS GRAFTING (CABG) x FOUR, USING LEFT RADIAL ARTERY AND RIGHT LEG GREATER SAPHENOUS VEIN HARVESTED ENDOSCOPICALLY (N/A) RADIAL ARTERY HARVEST left (Left) PLACEMENT OF IMPELLA LEFT VENTRICULAR ASSIST DEVICE USING ABIOMED IMPELLA 5.5 (Right) TRANSESOPHAGEAL ECHOCARDIOGRAM (TEE) (N/A) ENDOVEIN HARVEST OF GREATER SAPHENOUS VEIN (Right) as a surgical intervention.  The patient's history has been reviewed, patient examined, no change in status, stable for surgery.  I have reviewed the patient's chart and labs.  Questions were answered to the patient's satisfaction.      [employer]

## 2020-07-08 NOTE — Anesthesia Procedure Notes (Signed)
Central Venous Catheter Insertion Performed by: Cecile Hearing, MD, anesthesiologist Start/End10/14/2021 3:30 PM, 07/08/2020 3:39 PM Patient location: OR. Preanesthetic checklist: patient identified, IV checked, site marked, risks and benefits discussed, surgical consent, monitors and equipment checked, pre-op evaluation, timeout performed and anesthesia consent Position: supine Hand hygiene performed  and maximum sterile barriers used  Total catheter length 100. PA cath was placed.Swan type:thermodilution PA Cath depth:48 Procedure performed without using ultrasound guided technique. Attempts: 1 Patient tolerated the procedure well with no immediate complications.

## 2020-07-08 NOTE — CV Procedure (Signed)
Procedure: TEE  Sedation: Versed/Fentanyl  Indication: ?RA thrombus  Findings: Please see echo section for full report.  Mildly dilated LV with normal wall thickness.  EF 20% with peri-apical akinesis. Impella noted in LV. Normal RV size with mildly decreased systolic function.  Mildly dilated left atrium, no LA appendage thrombus.  The right atrium was compressed significantly by a large hematoma.  This appears to be extracardiac and compressing, rather than an intra-atrial thrombus. There is obstruction to RV inflow. Trileaflet aortic valve, mild AI in setting of Impella with no aortic stenosis. S/p mitral valve repair with trivial regurgitation, no significant stenosis.   Viewed images with Dr. Dorris Fetch, plan to return to OR.   Marca Ancona 07/08/2020 12:44 PM

## 2020-07-08 NOTE — Anesthesia Preprocedure Evaluation (Signed)
Anesthesia Evaluation  Patient identified by MRN, date of birth, ID band Patient unresponsive    Reviewed: Allergy & Precautions, NPO status , Patient's Chart, lab work & pertinent test results, Unable to perform ROS - Chart review onlyPreop documentation limited or incomplete due to emergent nature of procedure.  History of Anesthesia Complications Negative for: history of anesthetic complications  Airway Mallampati: Intubated       Dental   Pulmonary neg pulmonary ROS,    breath sounds clear to auscultation       Cardiovascular + CAD, + Past MI and +CHF   Rhythm:Regular     Neuro/Psych negative neurological ROS     GI/Hepatic negative GI ROS, Neg liver ROS,   Endo/Other    Renal/GU negative Renal ROS     Musculoskeletal   Abdominal   Peds  Hematology  (+) Blood dyscrasia, anemia ,   Anesthesia Other Findings S/p open heart surgery with tamponade   Reproductive/Obstetrics                             Anesthesia Physical Anesthesia Plan  ASA: IV and emergent  Anesthesia Plan: General   Post-op Pain Management:    Induction: Intravenous  PONV Risk Score and Plan: 2 and Treatment may vary due to age or medical condition  Airway Management Planned: Oral ETT  Additional Equipment: Arterial line, CVP and PA Cath  Intra-op Plan:   Post-operative Plan: Post-operative intubation/ventilation  Informed Consent:     Only emergency history available and History available from chart only  Plan Discussed with: CRNA and Surgeon  Anesthesia Plan Comments:         Anesthesia Quick Evaluation

## 2020-07-08 NOTE — Brief Op Note (Signed)
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  PRE-OPERATIVE DIAGNOSIS:  CARDIAC TAMPONADE  POST-OPERATIVE DIAGNOSIS:  CARDIAC TAMPONADE  PROCEDURE:  Procedure(s): EXPLORATION POST OPERATIVE OPEN HEART TAMPONADE (N/A)  SURGEON:  Surgeon(s) and Role:    * Loreli Slot, MD - Primary  PHYSICIAN ASSISTANT: none  ASSISTANTS: Benay Spice, RNFA   ANESTHESIA:   general  EBL: 250 ml  BLOOD ADMINISTERED: 2U PRBC, 6U FFP, 1 PLT pheresis, 1 bag of CRYO  DRAINS:4 chest tubes  LOCAL MEDICATIONS USED:  NONE  SPECIMEN:  No Specimen  DISPOSITION OF SPECIMEN:  N/A  COUNTS:  YES  TOURNIQUET:  * No tourniquets in log *  DICTATION: .Other Dictation: Dictation Number -  PLAN OF CARE: Admit to inpatient   PATIENT DISPOSITION:  ICU - intubated and critically ill.   Delay start of Pharmacological VTE agent (>24hrs) due to surgical blood loss or risk of bleeding: yes

## 2020-07-08 NOTE — H&P (View-Only) (Signed)
1 Day Post-Op Procedure(s) (LRB): CORONARY ARTERY BYPASS GRAFTING (CABG) x FOUR, USING LEFT RADIAL ARTERY AND RIGHT LEG GREATER SAPHENOUS VEIN HARVESTED ENDOSCOPICALLY (N/A) RADIAL ARTERY HARVEST left (Left) PLACEMENT OF IMPELLA LEFT VENTRICULAR ASSIST DEVICE USING ABIOMED IMPELLA 5.5 (Right) TRANSESOPHAGEAL ECHOCARDIOGRAM (TEE) (N/A) ENDOVEIN HARVEST OF GREATER SAPHENOUS VEIN (Right) Subjective: Intubated, sedated  Objective: Vital signs in last 24 hours: Temp:  [98.6 F (37 C)-101.5 F (38.6 C)] 99.7 F (37.6 C) (10/14 1000) Pulse Rate:  [73-107] 83 (10/14 1112) Cardiac Rhythm: Normal sinus rhythm (10/14 0800) Resp:  [14-41] 21 (10/14 1112) BP: (63-130)/(44-108) 96/74 (10/14 1100) SpO2:  [93 %-100 %] 100 % (10/14 1112) Arterial Line BP: (59-133)/(49-104) 94/71 (10/14 1000) FiO2 (%):  [50 %-60 %] 50 % (10/14 1112)  Hemodynamic parameters for last 24 hours: PAP: (18-40)/(-10-26) 25/18 CVP:  [12 mmHg-26 mmHg] 26 mmHg PCWP:  [21 mmHg-27 mmHg] 21 mmHg CO:  [3.2 L/min-3.8 L/min] 3.8 L/min CI:  [1.7 L/min/m2-2.1 L/min/m2] 2.1 L/min/m2  Intake/Output from previous day: 10/13 0701 - 10/14 0700 In: 8630 [I.V.:5311.8; Blood:1831.7; IV Piggyback:1378.7] Out: 4755 [Urine:2015; Blood:1375; Chest Tube:1365] Intake/Output this shift: Total I/O In: 1107.3 [I.V.:772.7; Blood:315; Other:19.6] Out: 250 [Urine:140; Chest Tube:110]  General appearance: intubated Neurologic: seatt Heart: regular rate and rhythm Lungs: clear to auscultation bilaterally Abdomen: normal findings: soft, non-tender Extremities: cool  Lab Results: Recent Labs    07/08/20 0013 07/08/20 0013 07/08/20 0412 07/08/20 0412 07/08/20 0422 07/08/20 0619  WBC 24.7*  --  21.2*  --   --   --   HGB 8.0*   < > 7.2*   < > 6.8* 7.5*  HCT 25.4*   < > 23.3*   < > 20.0* 22.0*  PLT 161  --  155  --   --   --    < > = values in this interval not displayed.   BMET:  Recent Labs    07/08/20 0013 07/08/20 0013  07/08/20 0412 07/08/20 0412 07/08/20 0422 07/08/20 0619  NA 141   < > 139   < > 142 141  K 5.2*   < > 4.5   < > 4.5 4.4  CL 113*  --  112*  --   --   --   CO2 19*  --  16*  --   --   --   GLUCOSE 182*  --  186*  --   --   --   BUN 16  --  18  --   --   --   CREATININE 1.24  --  1.42*  --   --   --   CALCIUM 7.9*  --  7.5*  --   --   --    < > = values in this interval not displayed.    PT/INR:  Recent Labs    07/08/20 0013  LABPROT 23.0*  INR 2.1*   ABG    Component Value Date/Time   PHART 7.320 (L) 07/08/2020 0619   HCO3 11.0 (L) 07/08/2020 0619   TCO2 12 (L) 07/08/2020 0619   ACIDBASEDEF 13.0 (H) 07/08/2020 0619   O2SAT 100.0 07/08/2020 0619   CBG (last 3)  Recent Labs    07/08/20 0801 07/08/20 0939 07/08/20 1106  GLUCAP 151* 163* 166*    Assessment/Plan: S/P Procedure(s) (LRB): CORONARY ARTERY BYPASS GRAFTING (CABG) x FOUR, USING LEFT RADIAL ARTERY AND RIGHT LEG GREATER SAPHENOUS VEIN HARVESTED ENDOSCOPICALLY (N/A) RADIAL ARTERY HARVEST left (Left) PLACEMENT OF IMPELLA LEFT VENTRICULAR ASSIST DEVICE USING ABIOMED  IMPELLA 5.5 (Right) TRANSESOPHAGEAL ECHOCARDIOGRAM (TEE) (N/A) ENDOVEIN HARVEST OF GREATER SAPHENOUS VEIN (Right) -POD #1 CV- Impella functioning, repositioned slightly by Dr. Shirlee Latch earlier  CVP elevated despite low PA pressures  ECHO earlier showed possible RA thrombus, appears to be in RA no pericardial  Unusual finding in setting of early postop and coagulopathy even with foreign body Ernestine Conrad)  Will check TEE to see if we can better assess RESP - remains intubated. CXR improved from preop RENAL- creatinine up c/w AKI  Monitor UO, follow labs ENDO- CBG OK Anemia secondary to ABL- transfused earlier Keep CT in place Overall remains critically ill  LOS: 13 days    Loreli Slot 07/08/2020

## 2020-07-08 NOTE — Progress Notes (Addendum)
Patient ID: Jerry [name], male   DOB: 1971/05/02, 49 y.o.   MRN: [id number] P    Advanced Heart Failure Rounding Note  PCP-Cardiologist: Sherren Mocha, MD    Patient Profile   49 y/o male s/p recent back surgery, admitted for acute inferior and anterolateral MI. LHC showed thrombotic occlusion of distal RCA that was treated with PTCA and thrombectomy.  He was also noted to have chronic total occlusion of the LAD with collaterals from the right (thus this territory was affected by the RCA MI) as well as 90% complex proximal LCx stenosis. EF 25-30% + apical thrombus. RV normal. Course b/c cardiogenic shock and acute hypoxic respiratory failure. ? Component of septic shock.    Subjective:    CABG + MV repair and placement of Impella 5.5 on 10/15.   Overnight, increasing pressor requirement with suction alarms on Impella (turned down).   Echo done this morning, showing Impella position too close to the posterior wall, no pericardial effusion, large right atrial thrombus.  RV function appeared only mildly hypokinetic with normal RV size, LV EF in the 20-25% range.   He remains on milrinone 0.3, epinephrine 20, norepinephrine 14.  HCO3 low at 16, creatinine 1.4.  He is getting heparin only in purge currently.  He is in NSR on IV amiodarone.   Swan numbers: CVP 22 PA 25/18 CI 2.1  Impella 5.5 P7 with flow 3.5 L/min LDH 428  Objective:   Weight Range: 71 kg Body mass index is 23.8 kg/m.   Vital Signs:   Temp:  [98.6 F (37 C)-101.5 F (38.6 C)] 99.7 F (37.6 C) (10/14 1000) Pulse Rate:  [73-107] 87 (10/14 1000) Resp:  [14-41] 19 (10/14 1000) BP: (63-130)/(44-108) 98/74 (10/14 1000) SpO2:  [93 %-100 %] 100 % (10/14 1000) Arterial Line BP: (59-133)/(49-104) 94/71 (10/14 1000) FiO2 (%):  [50 %-60 %] 50 % (10/14 0743) Last BM Date: 07/06/20  Weight change: Filed Weights   07/05/20 0300 07/06/20 0403 07/07/20 0618  Weight: 73.7 kg 71.4 kg 71 kg     Intake/Output:   Intake/Output Summary (Last 24 hours) at 07/08/2020 1047 Last data filed at 07/08/2020 1000 Gross per 24 hour  Intake 9387.31 ml  Output 4805 ml  Net 4582.31 ml      Physical Exam   General: Sedated on vent.  Neck: JVP 16+, no thyromegaly or thyroid nodule.  Lungs: Decreased at bases.  CV: Nondisplaced PMI.  Heart regular S1/S2, no S3/S4, Impella sounds.  Trace ankle edema.  Abdomen: Soft, nontender, no hepatosplenomegaly, no distention.  Skin: Intact without lesions or rashes.  Neurologic: Sedated on vent.  Extremities: No clubbing or cyanosis.  HEENT: Normal.    Telemetry   NSR 80s Personally reviewed  Labs    CBC Recent Labs    07/08/20 0013 07/08/20 0013 07/08/20 0412 07/08/20 0412 07/08/20 0422 07/08/20 0619  WBC 24.7*  --  21.2*  --   --   --   NEUTROABS 21.1*  --  17.5*  --   --   --   HGB 8.0*   < > 7.2*   < > 6.8* 7.5*  HCT 25.4*   < > 23.3*   < > 20.0* 22.0*  MCV 92.7  --  92.5  --   --   --   PLT 161  --  155  --   --   --    < > = values in this interval not displayed.   Basic Metabolic Panel Recent  Labs    07/08/20 0013 07/08/20 0013 07/08/20 0412 07/08/20 0412 07/08/20 0422 07/08/20 0619  NA 141   < > 139   < > 142 141  K 5.2*   < > 4.5   < > 4.5 4.4  CL 113*  --  112*  --   --   --   CO2 19*  --  16*  --   --   --   GLUCOSE 182*  --  186*  --   --   --   BUN 16  --  18  --   --   --   CREATININE 1.24  --  1.42*  --   --   --   CALCIUM 7.9*  --  7.5*  --   --   --   MG 2.8*  --  2.7*  --   --   --   PHOS 5.0*  --  4.6  --   --   --    < > = values in this interval not displayed.   Liver Function Tests Recent Labs    07/07/20 0120 07/07/20 0120 07/08/20 0013 07/08/20 0412  AST 36  --   --   --   ALT 43  --   --   --   ALKPHOS 55  --   --   --   BILITOT 0.5  --   --   --   PROT 6.3*  --   --   --   ALBUMIN 2.7*   < > 3.1* 2.9*   < > = values in this interval not displayed.   No results for input(s):  LIPASE, AMYLASE in the last 72 hours. Cardiac Enzymes No results for input(s): CKTOTAL, CKMB, CKMBINDEX, TROPONINI in the last 72 hours.  BNP: BNP (last 3 results) Recent Labs    06/25/20 2334 06/26/20 0150  BNP 349.1* 368.4*    ProBNP (last 3 results) No results for input(s): PROBNP in the last 8760 hours.   D-Dimer No results for input(s): DDIMER in the last 72 hours. Hemoglobin A1C Recent Labs    07/07/20 0120  HGBA1C 5.4   Fasting Lipid Panel No results for input(s): CHOL, HDL, LDLCALC, TRIG, CHOLHDL, LDLDIRECT in the last 72 hours. Thyroid Function Tests No results for input(s): TSH, T4TOTAL, T3FREE, THYROIDAB in the last 72 hours.  Invalid input(s): FREET3  Other results:   Imaging    DG Chest Port 1 View  Result Date: 07/08/2020 CLINICAL DATA:  Status post coronary bypass graft. EXAM: PORTABLE CHEST 1 VIEW COMPARISON:  July 07, 2020. FINDINGS: Stable cardiomediastinal silhouette. Endotracheal and nasogastric tubes are unchanged in position. Right internal jugular Swan-Ganz catheter is unchanged. Impella device is unchanged. Bilateral chest tubes are noted without pneumothorax. No significant pleural effusion is noted. Improved bilateral pulmonary edema is noted. Bony thorax is unremarkable. IMPRESSION: Stable support apparatus. No pneumothorax is noted. Improved bilateral pulmonary edema is noted. Electronically Signed   By: Marijo Conception M.D.   On: 07/08/2020 08:13   DG Chest Port 1 View  Result Date: 07/07/2020 CLINICAL DATA:  Status post coronary artery bypass grafting EXAM: PORTABLE CHEST 1 VIEW COMPARISON:  July 07, 2020 study obtained earlier in the day FINDINGS: Endotracheal tube tip is 2.9 cm above the carina. Nasogastric tube tip is in the proximal stomach with the side port at the gastroesophageal junction. Swan-Ganz catheter tip is in the proximal right main pulmonary artery. There is an Impella device  in the right ventricle region. Temporary  pacemaker wires are attached to the heart. There is a chest tube on each side and a mediastinal drain present. Left subclavian catheter tip is in the superior vena cava. There is no appreciable pneumothorax. There is postoperative change in the left apex with surgical clips. There is apical pleural thickening on the left. There is mild subcutaneous air in the supraclavicular region on the left. There are surgical clips in the right axillary region. There is evidence of prior trauma with screw and plate fixation in the right clavicle. There is mild left base atelectasis with minimal left pleural effusion. There is also atelectatic change in the left upper lobe. There is ill-defined opacity in portions of the right mid and lower lung regions which may represent mild pulmonary edema. No consolidation. Stable cardiac silhouette. IMPRESSION: Tube and catheter positions as described without pneumothorax. Note that the nasogastric tube side port is at the gastroesophageal junction. It may be prudent to advance nasogastric tube 4-5 cm. No evident pneumothorax. Mild air in the left supraclavicular region noted. Suspect a degree of underlying interstitial pulmonary edema. No consolidation. There is mild left base atelectasis with minimal left pleural effusion. There is left upper lobe atelectasis as well. Heart upper normal in size. Electronically Signed   By: William  Woodruff III M.D.   On: 07/07/2020 19:05   DG C-Arm 1-60 Min-No Report  Result Date: 07/07/2020 Fluoroscopy was utilized by the requesting physician.  No radiographic interpretation.     Medications:     Scheduled Medications: . sodium chloride   Intravenous Once  . acetaminophen  1,000 mg Oral Q6H   Or  . acetaminophen (TYLENOL) oral liquid 160 mg/5 mL  1,000 mg Per Tube Q6H  . aspirin EC  325 mg Oral Daily   Or  . aspirin  324 mg Per Tube Daily  . [START ON 07/09/2020] atorvastatin  80 mg Per Tube Daily  . bisacodyl  10 mg Oral Daily    Or  . bisacodyl  10 mg Rectal Daily  . chlorhexidine gluconate (MEDLINE KIT)  15 mL Mouth Rinse BID  . Chlorhexidine Gluconate Cloth  6 each Topical Daily  . clonazePAM  0.5 mg Per Tube TID BM  . docusate  200 mg Per Tube Daily  . mouth rinse  15 mL Mouth Rinse 10 times per day  . metoprolol tartrate  12.5 mg Oral BID   Or  . metoprolol tartrate  12.5 mg Per Tube BID  . [START ON 07/09/2020] pantoprazole sodium  40 mg Per Tube Daily  . sodium chloride flush  10-40 mL Intracatheter Q12H  . sodium chloride flush  3 mL Intravenous Q12H    Infusions: . sodium chloride 20 mL/hr at 07/08/20 1000  . sodium chloride    . sodium chloride    . albumin human 12.5 g (07/08/20 1033)  . amiodarone 30 mg/hr (07/08/20 1000)  . cefUROXime (ZINACEF)  IV Stopped (07/08/20 0655)  . dexmedetomidine (PRECEDEX) IV infusion 1.2 mcg/kg/hr (07/08/20 1000)  . epinephrine 20 mcg/min (07/08/20 1034)  . fentaNYL infusion INTRAVENOUS 300 mcg/hr (07/08/20 1000)  . impella catheter heparin 25 unit/mL in dextrose 5%    . insulin 6 mL/hr at 07/08/20 1000  . lactated ringers    . lactated ringers    . lactated ringers 20 mL/hr at 07/08/20 1000  . milrinone 0.3 mcg/kg/min (07/08/20 1000)  . nitroGLYCERIN Stopped (07/07/20 2047)  . norepinephrine (LEVOPHED) Adult infusion 12 mcg/min (07/08/20 1000)  .   phenylephrine (NEO-SYNEPHRINE) Adult infusion 30 mcg/min (07/08/20 1000)  . vancomycin      PRN Medications: sodium chloride, albumin human, dextrose, lactated ringers, LORazepam, metoprolol tartrate, midazolam, morphine injection, ondansetron (ZOFRAN) IV, oxyCODONE, sodium chloride flush, sodium chloride flush, traMADol    Assessment/Plan   1. CAD: Late presentation inferior MI.  Cath showed thrombotic occlusion distal RCA, CTO LAD with collaterals from right, and complex 90% proximal LCx stenosis.  He had PTCA/thrombectomy RCA with good flow at end of procedure.  Suspect he had damage to LAD territory as well  as RCA territory as RCA collateralized the LAD.  Now s/p CABG with LIMA-LAD, left radial to OM, seq SVG-PDA/PLV - ASA/statin.  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 Suspect post-MI pericarditis. Symptoms resolved prior to surgery.   3. Acute systolic CHF>>Cardiogenic Shock: Echo with EF 25-30%, RV ok (no evidence for RV infarct), LV thrombus.  Repeat bedside echo 06/27/20  EF 25%, normal RV w/ no pericardial effusion, no LV thrombus seen on this echo. Now with post-op shock, has Impella 5.5 in place.  He is on epinephrine 20, norepinephrine 14, milrinone 0.3.  CVP high at 22 in setting of large right atrial thrombus, appears to be causing obstruction to RV inflow.  Impella position re-adjusted today, now up to P7  - Swan removed due to large RA thrombus, he has a PICC line to follow CVP and co-ox.  - Thrombus is creating obstruction to RV inflow, will need to be dealt with => start therapeutic heparin gtt when ok with TCTS (when chest tube output lower hopefully this afternoon).  Going back to OR would be difficult/very high risk, discussing with Dr. Roxan Hockey, will do TEE today to confirm clot.  - Continue current inotropes/pressors, wean as able.  - Flotrak not useful without pulsatile flow.  Will try to follow CVP/co-ox off Swan though RV inflow obstruction will affect readings.  4. AF with RVR: Noted 10/12, now in NSR on amiodarone.  - Continue amiodarone gtt.  5. LV thrombus: Resolved on 10/3 echo.  - Restart heparin as soon as feasible as above.  6. ID: Afebrile now.  PCT was initially mildly elevated 0.96. This may be effect of acute MI with post-infarct pericarditis but treated empirically w/ broad spectrum abx, vanc + cefepime. Possible RLL PNA on CXR.  Blood cultures NGTD, sputum cultures unremarkable. Completed 7 days vancomycin/cefepime.  Post-op low grade fever, follow.  8. S/p back surgery:  Micro-discectomy just prior to admission.  Neurosurgery following, appreciated.  9. Possible benzo/narcotic withdrawal: Concern GF brought him drugs.  10. RA thrombus: Not seen on intra-op TEE, noted on echo this morning.  - Swan removed.  - Start heparin gtt when able to do so.  11. Anemia: Post-op blood loss.  - Transfuse to keep hgb > 8.   CRITICAL CARE Performed by: Loralie Champagne  Total critical care time: 55 minutes  Critical care time was exclusive of separately billable procedures and treating other patients.  Critical care was necessary to treat or prevent imminent or life-threatening deterioration.  Critical care was time spent personally by me on the following activities: development of treatment plan with patient and/or surrogate as well as nursing, discussions with consultants, evaluation of patient's response to treatment, examination of patient, obtaining history from patient or surrogate, ordering and performing treatments and interventions, ordering and review of laboratory studies, ordering and review of radiographic studies,  pulse oximetry and re-evaluation of patient's condition.   Loralie Champagne, MD 07/08/2020 10:47 AM   TEE done, there is an extracardiac large hematoma obstructing the right atrium and limiting RV inflow, NOT an intra-atrial thrombus.  Viewed with Dr. Roxan Hockey, plan to return to OR for hematoma evacuation and replacement of Swan.   Loralie Champagne 07/08/2020 12:45 PM

## 2020-07-08 NOTE — Anesthesia Procedure Notes (Signed)
Date/Time: 07/08/2020 1:38 PM Performed by: Samara Deist, CRNA Pre-anesthesia Checklist: Patient identified, Emergency Drugs available, Suction available and Patient being monitored Patient Re-evaluated:Patient Re-evaluated prior to induction Oxygen Delivery Method: Circle system utilized Preoxygenation: Pre-oxygenation with 100% oxygen Induction Type: Inhalational induction with existing ETT Placement Confirmation: positive ETCO2 and breath sounds checked- equal and bilateral Dental Injury: Teeth and Oropharynx as per pre-operative assessment

## 2020-07-08 NOTE — Op Note (Signed)
NAMEOZRO, RUSSETT MEDICAL RECORD [id number] ACCOUNT [id number] DATE OF BIRTH:December 07, 1970 FACILITY: [facility] LOCATION: [facility] PHYSICIAN: C. , MD  OPERATIVE REPORT  DATE OF PROCEDURE:  07/07/2020  PREOPERATIVE DIAGNOSES:  Three-vessel coronary artery disease, status post ST elevation myocardial infarction with ischemic cardiomyopathy and cardiogenic shock.  POSTOPERATIVE DIAGNOSES:   1.  Three-vessel coronary artery disease, status post ST elevation myocardial infarction with ischemic cardiomyopathy and cardiogenic shock.  2.  Severe mitral regurgitation.  PROCEDURES:   1.  Median sternotomy.   2.  Extracorporeal circulation. 3.  Coronary artery bypass grafting x 4  Left internal mammary artery to left anterior descending,  Left radial artery to obtuse marginal 1,  Sequential saphenous vein graft to posterior descending and posterolateral. 4.  Mitral valve annuloplasty with 32 mm Carbomedics AnnuloFlex angioplasty ring.   5.  Endoscopic vein harvest, left thigh.   6.  Placement of an Impella 5.5 ventricular assist device via Gore-Tex graft in right axillary artery.  SURGEON:  Charlett Lango, MD  ASSISTANT:  Jillyn Hidden, PA-C  ANESTHESIA:  General.  FINDINGS:  Transesophageal echocardiography revealed severe left ventricular dysfunction, severe mitral regurgitation due to posterior annular dilatation, good quality target vessels, good quality conduits. Mild to moderate MR post-bypass, good position of Impella pump.  CLINICAL NOTE:  Mr. Heinzman is a 49 year old gentleman who presented with a ST elevation MI.  He presented very late in the course of the MI.   He had total occlusion of his right coronary  acutely and chronic total occlusion of his LAD.  Presentation  was complicated by cardiogenic shock and pulmonary edema and later by severe pericarditis.  The patient had been managed medically up to this point and is now felt ready for surgical intervention,  although this was a high risk procedure and likely will  need mechanical circulatory support.  The indications, risks, benefits, and alternatives were discussed in detail with the patient.  He accepted the risks and agreed to proceed.  DESCRIPTION OF PROCEDURE:  Mr. Carreon was brought to the preoperative holding area on 07/07/2020.  Lines were placed by anesthesia.  He was taken to the Operating Room, anesthetized and intubated.  Intravenous antibiotics were administered.  A Foley  catheter was placed.  The chest, abdomen and legs were prepped and draped in the usual sterile fashion.  Transesophageal echocardiography was performed by Dr. Marcene Duos.  It showed severe left ventricular dysfunction and a new finding of severe  mitral regurgitation.  Decision was made to proceed with mitral valve repair in addition to coronary bypass grafting.  The chest, abdomen, legs and left arm were prepped and draped in the usual sterile fashion.  A timeout was performed.  A median sternotomy was performed.  The left internal mammary artery was harvested using standard technique.  An incision was made over the volar aspect of the left wrist.  The radial artery was felt to be suitable for use and  the incision was extended below the antecubital fossa and the radial artery was harvested using the Harmonic scalpel.  Two thousand units of heparin was administered during the vessel harvest.  Both the mammary and radial artery turned out to be good  quality vessels.  After the radial artery was harvested, greater saphenous vein was harvested from the left thigh endoscopically.  It also was a good quality vessel.  The remainder of the full heparin dose was administered.  A sternal retractor was placed and the pericardium was opened.  There was fluid and  inflammatory changes within the pericardium, consistent with the patient's Dressler syndrome.  There was cardiomegaly.  There was scarring at the apex and acute infarct  changes in the posterior wall.  The ascending aorta was inspected.  It was of  normal caliber with no evidence of atherosclerotic disease.  After confirming adequate anticoagulation with ACT measurement, the aorta was cannulated via concentric 2-0 Ethibond pledgeted pursestring sutures.  A 31-French metal tipped venous cannula was  placed via a pursestring suture in the superior vena cava.  Cardiopulmonary bypass was initiated.  A 36-French malleable venous cannula was placed via a pursestring suture in the inferior aspect of the right atrium and directed into the inferior vena cava.  This was  connected to the bypass circuit and full flow was initiated.  The patient was cooled to 32 degrees C.  The coronary arteries were inspected and anastomotic sites were chosen.  The conduits were inspected and prepared.  A retrograde cardioplegia cannula was placed via a pursestring suture in the right  atrium and directed into the coronary sinus.  An antegrade cardioplegia cannula was placed in the ascending aorta.  A foam pad was placed in the pericardium to insulate the heart.  A temperature probe was placed in the myocardial septum.  The aorta was crossclamped.  The left ventricle was emptied via the aortic root vent.  Cardiac arrest was achieved with a combination of cold antegrade and retrograde blood cardioplegia and topical iced saline.  An initial 500 mL of cardioplegia was  given antegrade and then an additional 500 mL was given retrograde.  Additional cardioplegia was administered via the vein grafts at their completion,  between each graft and at 20 minute intervals during the mitral valve portion of the procedure.    A reversed saphenous vein graft was placed sequentially to the posterior descending and posterolateral branches of the right coronary.  Posterior descending was a 1.5 mm good quality target.  The posterolateral was a 2 mm good quality target.  The vein was  of good quality.  A side-to-side  anastomosis was performed to the PD and end-to-side to the PL.  Both were done with running 7-0 Prolene sutures.  All anastomoses were probed proximally and distally to ensure patency before tying the sutures.   Cardioplegia was administered down the graft and there was good flow and good hemostasis.  The heart was elevated.  The distal end of the radial artery was bevelled.  It was anastomosed end-to-side to OM1.  OM2 was not graftable where it could be located on the lateral wall.  OM1 was a 1.5 mm good quality target.  The end-to-side anastomosis  was performed with a running 8-0 Prolene suture.  At completion of the anastomosis, cardioplegia was administered and there was good flow and good hemostasis.  The left internal mammary artery was brought through a window in the pericardium.  The distal end was bevelled.  It was anastomosed end-to-side to the distal LAD.  The LAD was a 2 mm good quality target site of anastomosis.  There was atherosclerotic  disease proximal and distal to the site, but a probe did pass to the apex and for a long distance proximally.  The end-to-side anastomosis was performed with a running 8-0 Prolene suture.  At completion of the anastomosis, the bulldog clamp was removed.  Septal  rewarming was noted.  The bulldog clamp was replaced and the mammary pedicle was tacked to the epicardial surface of the heart with 6-0 Prolene  sutures.  It should be noted that there was extensive scar at the apex of the left ventricle.  A left atriotomy was performed via the interatrial groove.  A retractor was placed.  The mitral valve was inspected.  The leaflets appeared relatively normal, with normal chordae tendineae.  There were no ruptured cords.  There was posterior annular  dilatation.  The anterior leaflet and commissure were sized for a 32 mm Carbomedics annuloplasty ring.  2-0 Ethibond horizontal mattress sutures were placed in the annulus circumferentially.  A total of 10 sutures  were placed in all.  These were then  placed through the ring, which was lowered into place and the sutures were sequentially tied.  After tying the sutures, there still was a small leak near the posteromedial commissure.  A 4-0 Ethibond suture was used to close a small cleft at that site.   The valve then appeared to be competent.  Rewarming was begun.  The left atriotomy was closed in 2 layers with a running 4-0 Prolene horizontal mattress suture, followed by running 4-0 Prolene simple suture.  De-airing was performed before tying the  first layer.  While rewarming continued, the proximal anastomoses were performed to 4.0 mm punch aortotomies with running 6-0 Prolene sutures.  At the completion of the final proximal anastomosis, the patient was placed in Trendelenburg position.  The  left ventricle and aortic root were deaired.  The aortic crossclamp was removed.  The total crossclamp time was 134 minutes.   All proximal and distal anastomoses and the left atriotomy were inspected for hemostasis.  Epicardial pacing wires were placed  on the right ventricle and right atrium.  The patient was on multiple inotropic agents including milrinone, epinephrine, norepinephrine and vasopressin.  When the core temperature reached 37 degrees Celsius, a trial weaning was performed.  It was clear  the patient was not going to be able to wean from bypass without the Impella device.  There was mild to moderate mitral regurgitation.  Given the severity of his left ventricular dysfunction, it was not felt that additional intervention on the mitral  valve was warranted at this point in time.  An incision was made in the right deltopectoral groove and the right axillary artery was dissected out.  There were extensive adhesions in this area and the brachial plexus was in close proximity.  The dissection was done carefully in this area to avoid  traction on the brachial plexus.  Clamps were placed proximally and distally on  the axillary artery and an arteriotomy was performed.  A 10 mm hemashield graft was then sewn end-to-side with a running 5-0 Prolene suture.  The distal clamp was removed first and the graft  was deaired and then the proximal graft was proximal clamp was removed.  The Impella 5.5 device then was placed.  A wire was advanced and with transesophageal echocardiography confirmed across the aortic valve into the left ventricle, a pigtail catheter  then was advanced over this wire and a stiffer wire was placed.  Fluoroscopy was used to confirm position of the wire as well.  The Impella then was inserted via the graft and into the left ventricle.  Positioning was confirmed with both fluoroscopy and  transesophageal echocardiography.  Hemostasis was achieved.  Once the Impella was in place, the patient weaned from cardiopulmonary bypass on the first attempt.  A test dose of protamine was administered and was well tolerated.  The remainder of the  protamine was administered without incident.  The vena caval cannulae and the aortic cannulae were removed.  There was good hemostasis at the cannulation sites.  The chest was copiously irrigated with warm saline.  The pericardium was reapproximated over  the ascending aorta and base of the heart with interrupted 3-0 silk sutures.  Bilateral pleural tubes and a single mediastinal tube were placed through separate subcostal incisions.  The sternum was closed with a combination of single and double heavy  gauge stainless steel wires.  Pectoralis fascia, subcutaneous tissue and skin were closed in standard fashion.  All sponge, needle and instrument counts were correct at the end of the procedure.  The patient was taken from the operating room to the  Surgical Intensive Care Unit, intubated and in critical condition.  VN/NUANCE  D:07/08/2020 T:07/08/2020 JOB:[id number]

## 2020-07-08 NOTE — Progress Notes (Signed)
Nutrition Follow-up  DOCUMENTATION CODES:   Non-severe (moderate) malnutrition in context of acute illness/injury  INTERVENTION:   Tube feeding recommendations: - Vital 1.5 @ 50 ml/hr (1200 ml/day) via OG tube - ProSource TF 90 ml BID  Recommended tube feeding regimen would provide 1960 kcal, 125 grams of protein, and 917 ml of H2O.   NUTRITION DIAGNOSIS:   Moderate Malnutrition related to acute illness (recent back surgery) as evidenced by mild muscle depletion, moderate muscle depletion, mild fat depletion, moderate fat depletion.  Ongoing  GOAL:   Patient will meet greater than or equal to 90% of their needs  Unmet at this time  MONITOR:   PO intake, Supplement acceptance  REASON FOR ASSESSMENT:   Ventilator, Consult Enteral/tube feeding initiation and management  ASSESSMENT:   49 yo male admitted with STEMI with LV thrombus. S/P aspiration thrombectomy and balloon angioplasty 10/1. No significant PMH.  10/06 - extubated 10/13 - s/p CABG x 4, MV repair, impella  Discussed pt with RN and during ICU rounds. Pt remains intubated post-op. Per notes, pt with increasing pressor requirements overnight.  OG tube in place, currently to low intermittent suction. RD will monitor for ability to start tube feeds.  Admit weight: 76.3  Current weight: 71 kg  Patient is currently intubated on ventilator support MV: 11.4 L/min Temp (24hrs), Avg:100.2 F (37.9 C), Min:98.6 F (37 C), Max:101.5 F (38.6 C) BP (a-line): 84/65 MAP (a-line): 71  Drips: Precedex Epinephrine Levophed Fentanyl Amiodarone Milrinone Insulin: 6 ml/hr 1/2NS: 20 ml/hr LR: 20 ml/hr  Medications reviewed and include: dulcolax, colace, protonix, IV albumin, IV abx  Labs reviewed: ionized calcium 1.10, hemoglobin 7.5, magnesium 2.7 CBG's: 140-205 x 12 hours  UOP: 2015 ml x 24 hours CT: 1365 x 24 hours I/O's: +4.4 L since admit  Diet Order:   Diet Order    None      EDUCATION  NEEDS:   Not appropriate for education at this time  Skin:  Skin Assessment: Skin Integrity Issues: Incisions: open vertebral column from recent back surgery, left arm, right leg, chest  Last BM:  07/06/20  Height:   Ht Readings from Last 1 Encounters:  07/07/20 5\' 8"  (1.727 m)    Weight:   Wt Readings from Last 1 Encounters:  07/07/20 71 kg    Ideal Body Weight:  70 kg  BMI:  Body mass index is 23.8 kg/m.  Estimated Nutritional Needs:   Kcal:  2078  Protein:  110-130 grams  Fluid:  >/= 2 L    2079, MS, RD, LDN Inpatient Clinical Dietitian Please see [facility] for contact information.

## 2020-07-08 NOTE — Transfer of Care (Signed)
Immediate Anesthesia Transfer of Care Note  Patient: Engineer, site  Procedure(s) Performed: EXPLORATION POST OPERATIVE OPEN HEART TAMPONADE (N/A )  Patient Location: ICU  Anesthesia Type:General  Level of Consciousness: sedated  Airway & Oxygen Therapy: Patient remains intubated per anesthesia plan and Patient placed on Ventilator (see vital sign flow sheet for setting)  Post-op Assessment: Report given to RN and Post -op Vital signs reviewed and stable  Post vital signs: Reviewed and stable  Last Vitals:  Vitals Value Taken Time  BP    Temp    Pulse    Resp    SpO2      Last Pain:  Vitals:   07/08/20 1200  TempSrc: Bladder  PainSc:       Patients Stated Pain Goal: 0 (07/04/20 0400)  Complications: No complications documented.

## 2020-07-08 NOTE — Plan of Care (Signed)
  Problem: Education: Goal: Understanding of cardiac disease, CV risk reduction, and recovery process will improve Outcome: Progressing Goal: Understanding of medication regimen will improve Outcome: Progressing Goal: Individualized Educational Video(s) Outcome: Progressing   Problem: Activity: Goal: Ability to tolerate increased activity will improve Outcome: Progressing   Problem: Cardiac: Goal: Ability to achieve and maintain adequate cardiopulmonary perfusion will improve Outcome: Progressing Goal: Vascular access site(s) Level 0-1 will be maintained Outcome: Progressing   Problem: Health Behavior/Discharge Planning: Goal: Ability to safely manage health-related needs after discharge will improve Outcome: Progressing   Problem: Education: Goal: Knowledge of General Education information will improve Description: Including pain rating scale, medication(s)/side effects and non-pharmacologic comfort measures Outcome: Progressing   Problem: Health Behavior/Discharge Planning: Goal: Ability to manage health-related needs will improve Outcome: Progressing   Problem: Clinical Measurements: Goal: Ability to maintain clinical measurements within normal limits will improve Outcome: Progressing Goal: Will remain free from infection Outcome: Progressing Goal: Diagnostic test results will improve Outcome: Progressing Goal: Respiratory complications will improve Outcome: Progressing Goal: Cardiovascular complication will be avoided Outcome: Progressing   Problem: Activity: Goal: Risk for activity intolerance will decrease Outcome: Progressing   Problem: Nutrition: Goal: Adequate nutrition will be maintained Outcome: Progressing   Problem: Coping: Goal: Level of anxiety will decrease Outcome: Progressing   Problem: Elimination: Goal: Will not experience complications related to bowel motility Outcome: Progressing Goal: Will not experience complications related to  urinary retention Outcome: Progressing   Problem: Pain Managment: Goal: General experience of comfort will improve Outcome: Progressing   Problem: Safety: Goal: Ability to remain free from injury will improve Outcome: Progressing   Problem: Skin Integrity: Goal: Risk for impaired skin integrity will decrease Outcome: Progressing   

## 2020-07-08 NOTE — Anesthesia Postprocedure Evaluation (Signed)
Anesthesia Post Note  Patient: Engineer, site  Procedure(s) Performed: CORONARY ARTERY BYPASS GRAFTING (CABG) x FOUR, USING LEFT RADIAL ARTERY AND RIGHT LEG GREATER SAPHENOUS VEIN HARVESTED ENDOSCOPICALLY (N/A Chest) RADIAL ARTERY HARVEST left (Left Arm Lower) PLACEMENT OF IMPELLA LEFT VENTRICULAR ASSIST DEVICE USING ABIOMED IMPELLA 5.5 (Right Axilla) TRANSESOPHAGEAL ECHOCARDIOGRAM (TEE) (N/A ) ENDOVEIN HARVEST OF GREATER SAPHENOUS VEIN (Right Leg Upper)     Patient location during evaluation: SICU Anesthesia Type: General Level of consciousness: sedated Pain management: pain level controlled Vital Signs Assessment: post-procedure vital signs reviewed and stable Respiratory status: patient remains intubated per anesthesia plan Cardiovascular status: stable Postop Assessment: no apparent nausea or vomiting Anesthetic complications: no   No complications documented.  Last Vitals:  Vitals:   07/08/20 0743 07/08/20 0800  BP:  104/88  Pulse: 88 87  Resp: (!) 21 17  Temp: 37.5 C 37.6 C  SpO2: 100% 100%    Last Pain:  Vitals:   07/08/20 0800  TempSrc: Core  PainSc:                  Kennieth Rad

## 2020-07-08 NOTE — Op Note (Signed)
NAMEFINNEAN, CERAMI MEDICAL RECORD [id number] ACCOUNT [id number] DATE OF BIRTH:Oct 18, 1970 FACILITY: [facility] LOCATION: [facility] PHYSICIAN: Lars Pinks, MD  OPERATIVE REPORT  DATE OF PROCEDURE:  07/08/2020  PREOPERATIVE DIAGNOSIS:  Cardiac tamponade.  POSTOPERATIVE DIAGNOSIS:  Cardiac tamponade.  PROCEDURE:  Reexploration for tamponade and control of bleeding.  SURGEON:  Charlett Lango, MD  ASSISTANT:   Benay Spice, RNFA.  ANESTHESIA:  General.  FINDINGS:  Large clot overlying right atrium.  Bleeding from a vasa vasorum on a vein graft.  Multiple other small bleeding sites noted.  Improved hemodynamics after removal of clot.  CLINICAL NOTE:  Mr. Hershberger is a 49 year old gentleman who had undergone coronary bypass grafting, mitral valve annuloplasty, and placement of an Impella device on 07/07/2020.  The morning of 07/08/2020, he became relatively unstable.  Echocardiogram  suggested that there might be clot within the right atrium.  A TEE was performed, which showed that there was a clot external to the right atrium causing impaired filling.  The patient needed urgent surgical intervention to relieve the cardiac  tamponade.  He was intubated and unable to give consent, but given the urgency of the situation, was taken to the operating room.  On arrival to the operating room, he was already intubated and had Impella support in place.  He was already sedated, but  general anesthesia was induced.  Intravenous antibiotics were administered.  The chest, abdomen and thighs were prepped and draped in the usual sterile fashion.  A timeout was performed.  The sternal incision then was reopened.  The sternal wires were removed and a sternal retractor was placed.  The pericardium was opened.  There was a large clot overlying the right atrium.  This clot was evacuated and there was  gradual improvement of hemodynamics once that was performed.   There also was some clot noted superiorly in  the left apex near the mammary artery takedown site.  Other than that, there was relatively minimal blood within the mediastinum.  There were  multiple sites that were oozing blood and some of these sites were controlled with clips and others with cautery.  Of note, the patient was found to be severely coagulopathic and FFP was administered as soon as it was available.  Later in the procedure,  a thromboelastogram showed a need for cryoprecipitate, platelets and additional FFP, which was also administered.  Further inspection revealed some needle hole bleeding from the aorta around the proximal anastomosis of the vein graft.  This was repaired with a 7-0  Prolene suture.  There was no bleeding from either proximal anastomosis.  There was no bleeding around the mammary anastomosis and ultimately when the heart was lifted, there was no bleeding around the posterolateral or the radial to OM1 anastomosis.   Initially, it appeared there might have been some bleeding from the side-to-side anastomosis to the posterior descending, but it turned out there was actually bleeding from a small area on the vein graft, which appeared to be a vasa vasorum site.  This was sutured  with a 7-0 Prolene suture.  There did not appear to be any bleeding from the aortic or vena caval cannulation sites, or from the left atriotomy.  The chest was copiously irrigated with warm saline on multiple occasions.  There was a coagulopathic  bleeding from the chest wall and sternum prior to administering all the coagulation factors.  After observing with no evidence of ongoing major bleeding, an additional chest tube was placed.  This was a 28-French [employer]  drain then was placed in the  pericardium along the right atrium.  It was secured with a #1 silk suture.  The sternum was closed with heavy gauge stainless steel wires.  Pectoralis fascia, subcutaneous tissue and skin were closed in standard fashion.  All sponge, needle and  instrument  counts were correct at the end of the procedure.  The patient was taken from the operating room to the Surgical Intensive Care Unit, intubated and in critical condition.  VN/NUANCE  D:07/08/2020 T:07/08/2020 JOB:[id number]

## 2020-07-08 NOTE — Progress Notes (Signed)
Patient ID: Jerry [name], male   DOB: 05-06-1971, 49 y.o.   MRN: [id number] T[facility] Evening Rounds:  Taken back to OR today for tamponade related to coagulopathy. Has been much more stable since.   On milrinone 0.3, epi 1, NE, Impella at P8 with CI 2.3, PA 27/18, CVP 15.  Acidosis corrected.  Remains sedated on vent.  Urine output marginal BMET    Component Value Date/Time   NA 147 (H) 07/08/2020 1747   K 3.9 07/08/2020 1747   CL 112 (H) 07/08/2020 1731   CO2 24 07/08/2020 1731   GLUCOSE 130 (H) 07/08/2020 1731   BUN 22 (H) 07/08/2020 1731   CREATININE 1.85 (H) 07/08/2020 1731   CALCIUM 7.3 (L) 07/08/2020 1731   GFRNONAA 42 (L) 07/08/2020 1731   GFRAA >60 06/29/2020 0436   CBC    Component Value Date/Time   WBC 14.9 (H) 07/08/2020 1731   RBC 2.74 (L) 07/08/2020 1731   HGB 7.5 (L) 07/08/2020 1747   HCT 22.0 (L) 07/08/2020 1747   PLT PENDING 07/08/2020 1731   PLT PENDING 07/08/2020 1731   MCV 90.1 07/08/2020 1731   MCH 28.8 07/08/2020 1731   MCHC 32.0 07/08/2020 1731   RDW 14.5 07/08/2020 1731   LYMPHSABS 2.0 07/08/2020 0412   MONOABS 1.3 (H) 07/08/2020 0412   EOSABS 0.0 07/08/2020 0412   BASOSABS 0.0 07/08/2020 0412   Will transfuse another unit of PRBC's tonight.

## 2020-07-08 NOTE — Interval H&P Note (Signed)
History and Physical Interval Note:  Reviewed TEE with Dr. Shirlee Latch, the thrombus seen on echo is clearly outside the RA and compressing it resulting in tamponade. He needs to go back to the OR for reexploration and relief of tamponade and control of bleeding.  Unfortunately his mother left and I have not been able to reach her by telephone. He is intubated and sedated and cannot give consent. Will need to proceed urgently due to medical necessity, but will continue to try to reach family.  High risk situation but will almost certainly continue to deteriorate if tamponade not corrected.   07/08/2020 12:41 PM  Jerry [name]  has presented today for surgery, with the diagnosis of TAMPONADE.  The various methods of treatment have been discussed with the patient and family. After consideration of risks, benefits and other options for treatment, the patient has consented to  Procedure(s): EXPLORATION POST OPERATIVE OPEN HEART TAMPONADE (N/A) as a surgical intervention.  The patient's history has been reviewed, patient examined, no change in status, stable for surgery.  I have reviewed the patient's chart and labs.  Questions were answered to the patient's satisfaction.     Loreli Slot

## 2020-07-09 ENCOUNTER — Encounter (HOSPITAL_COMMUNITY): Payer: Self-pay | Admitting: Thoracic Surgery (Cardiothoracic Vascular Surgery)

## 2020-07-09 ENCOUNTER — Inpatient Hospital Stay (HOSPITAL_COMMUNITY): Payer: 59

## 2020-07-09 DIAGNOSIS — Z95811 Presence of heart assist device: Secondary | ICD-10-CM

## 2020-07-09 DIAGNOSIS — R570 Cardiogenic shock: Secondary | ICD-10-CM | POA: Diagnosis not present

## 2020-07-09 DIAGNOSIS — I5021 Acute systolic (congestive) heart failure: Secondary | ICD-10-CM

## 2020-07-09 LAB — GLUCOSE, CAPILLARY
Glucose-Capillary: 114 mg/dL — ABNORMAL HIGH (ref 70–99)
Glucose-Capillary: 103 mg/dL — ABNORMAL HIGH (ref 70–99)
Glucose-Capillary: 94 mg/dL (ref 70–99)
Glucose-Capillary: 104 mg/dL — ABNORMAL HIGH (ref 70–99)
Glucose-Capillary: 93 mg/dL (ref 70–99)
Glucose-Capillary: 101 mg/dL — ABNORMAL HIGH (ref 70–99)
Glucose-Capillary: 106 mg/dL — ABNORMAL HIGH (ref 70–99)
Glucose-Capillary: 123 mg/dL — ABNORMAL HIGH (ref 70–99)

## 2020-07-09 LAB — PREPARE FRESH FROZEN PLASMA: Unit division: 0

## 2020-07-09 LAB — PREPARE PLATELET PHERESIS: Unit division: 0

## 2020-07-09 LAB — BPAM PLATELET PHERESIS
Blood Product Expiration Date: 202110172359
ISSUE DATE / TIME: 202110141511
Unit Type and Rh: 5100

## 2020-07-09 LAB — BPAM FFP
Blood Product Expiration Date: 202110182359
Blood Product Expiration Date: 202110182359
Blood Product Expiration Date: 202110192359
Blood Product Expiration Date: 202110192359
Blood Product Expiration Date: 202110192359
Blood Product Expiration Date: 202110192359
ISSUE DATE / TIME: 202110141325
ISSUE DATE / TIME: 202110141325
ISSUE DATE / TIME: 202110141350
ISSUE DATE / TIME: 202110141350
ISSUE DATE / TIME: 202110141511
ISSUE DATE / TIME: 202110141511
Unit Type and Rh: 5100
Unit Type and Rh: 5100
Unit Type and Rh: 5100
Unit Type and Rh: 6200
Unit Type and Rh: 6200
Unit Type and Rh: 9500

## 2020-07-09 LAB — POCT I-STAT 7, (LYTES, BLD GAS, ICA,H+H)
Acid-Base Excess: 1 mmol/L (ref 0.0–2.0)
Bicarbonate: 26.3 mmol/L (ref 20.0–28.0)
Calcium, Ion: 1.1 mmol/L — ABNORMAL LOW (ref 1.15–1.40)
HCT: 36 % — ABNORMAL LOW (ref 39.0–52.0)
Hemoglobin: 12.2 g/dL — ABNORMAL LOW (ref 13.0–17.0)
O2 Saturation: 95 %
Patient temperature: 38
Potassium: 3.8 mmol/L (ref 3.5–5.1)
Sodium: 144 mmol/L (ref 135–145)
TCO2: 28 mmol/L (ref 22–32)
pCO2 arterial: 45.1 mmHg (ref 32.0–48.0)
pH, Arterial: 7.378 (ref 7.350–7.450)
pO2, Arterial: 82 mmHg — ABNORMAL LOW (ref 83.0–108.0)

## 2020-07-09 LAB — COMPREHENSIVE METABOLIC PANEL
ALT: 1621 U/L — ABNORMAL HIGH (ref 0–44)
AST: 2131 U/L — ABNORMAL HIGH (ref 15–41)
Albumin: 3 g/dL — ABNORMAL LOW (ref 3.5–5.0)
Alkaline Phosphatase: 30 U/L — ABNORMAL LOW (ref 38–126)
Anion gap: 8 (ref 5–15)
BUN: 24 mg/dL — ABNORMAL HIGH (ref 6–20)
CO2: 25 mmol/L (ref 22–32)
Calcium: 7.2 mg/dL — ABNORMAL LOW (ref 8.9–10.3)
Chloride: 110 mmol/L (ref 98–111)
Creatinine, Ser: 1.59 mg/dL — ABNORMAL HIGH (ref 0.61–1.24)
GFR, Estimated: 50 mL/min — ABNORMAL LOW (ref >60)
Glucose, Bld: 115 mg/dL — ABNORMAL HIGH (ref 70–99)
Potassium: 3.7 mmol/L (ref 3.5–5.1)
Sodium: 143 mmol/L (ref 135–145)
Total Bilirubin: 1.9 mg/dL — ABNORMAL HIGH (ref 0.3–1.2)
Total Protein: 4.4 g/dL — ABNORMAL LOW (ref 6.5–8.1)

## 2020-07-09 LAB — ECHOCARDIOGRAM LIMITED
Height: 68 in
Weight: 3001.78 oz

## 2020-07-09 LAB — CBC
HCT: 30.4 % — ABNORMAL LOW (ref 39.0–52.0)
Hemoglobin: 9.8 g/dL — ABNORMAL LOW (ref 13.0–17.0)
MCH: 28.8 pg (ref 26.0–34.0)
MCHC: 32.2 g/dL (ref 30.0–36.0)
MCV: 89.4 fL (ref 80.0–100.0)
Platelets: 55 10*3/uL — ABNORMAL LOW (ref 150–400)
RBC: 3.4 MIL/uL — ABNORMAL LOW (ref 4.22–5.81)
RDW: 14.3 % (ref 11.5–15.5)
WBC: 12.7 10*3/uL — ABNORMAL HIGH (ref 4.0–10.5)
nRBC: 0 % (ref 0.0–0.2)

## 2020-07-09 LAB — ECHO INTRAOPERATIVE TEE
Height: 68 in
MV Vena cont: 0.6 cm
Weight: 2504.43 oz

## 2020-07-09 LAB — APTT: aPTT: 39 seconds — ABNORMAL HIGH (ref 24–36)

## 2020-07-09 LAB — COOXEMETRY PANEL
Carboxyhemoglobin: 0.9 % (ref 0.5–1.5)
Methemoglobin: 0.9 % (ref 0.0–1.5)
O2 Saturation: 59 %
Total hemoglobin: 9.9 g/dL — ABNORMAL LOW (ref 12.0–16.0)

## 2020-07-09 LAB — BASIC METABOLIC PANEL
Anion gap: 8 (ref 5–15)
BUN: 25 mg/dL — ABNORMAL HIGH (ref 6–20)
CO2: 28 mmol/L (ref 22–32)
Calcium: 7.2 mg/dL — ABNORMAL LOW (ref 8.9–10.3)
Chloride: 109 mmol/L (ref 98–111)
Creatinine, Ser: 1.64 mg/dL — ABNORMAL HIGH (ref 0.61–1.24)
GFR, Estimated: 48 mL/min — ABNORMAL LOW (ref >60)
Glucose, Bld: 143 mg/dL — ABNORMAL HIGH (ref 70–99)
Potassium: 4.8 mmol/L (ref 3.5–5.1)
Sodium: 145 mmol/L (ref 135–145)

## 2020-07-09 LAB — HEPARIN LEVEL (UNFRACTIONATED): Heparin Unfractionated: <0.1 IU/mL — ABNORMAL LOW (ref 0.30–0.70)

## 2020-07-09 LAB — BPAM CRYOPRECIPITATE
Blood Product Expiration Date: 202110142105
ISSUE DATE / TIME: 202110141534
Unit Type and Rh: 5100

## 2020-07-09 LAB — PROTIME-INR
INR: 2.3 — ABNORMAL HIGH (ref 0.8–1.2)
Prothrombin Time: 24.3 seconds — ABNORMAL HIGH (ref 11.4–15.2)

## 2020-07-09 LAB — PREPARE CRYOPRECIPITATE: Unit division: 0

## 2020-07-09 LAB — LACTATE DEHYDROGENASE: LDH: 1027 U/L — ABNORMAL HIGH (ref 98–192)

## 2020-07-09 MED ORDER — FUROSEMIDE 10 MG/ML IJ SOLN
40.0000 mg | Freq: Once | INTRAMUSCULAR | Status: AC
  Administered 2020-07-09: 40 mg via INTRAVENOUS
  Filled 2020-07-09: qty 4

## 2020-07-09 MED ORDER — ACETAMINOPHEN 650 MG RE SUPP
650.0000 mg | Freq: Four times a day (QID) | RECTAL | Status: DC | PRN
  Administered 2020-07-09 – 2020-07-11 (×6): 650 mg via RECTAL
  Filled 2020-07-09 (×6): qty 1

## 2020-07-09 MED ORDER — ACETAMINOPHEN 500 MG PO TABS: 1000.0000 mg | ORAL_TABLET | Freq: Four times a day (QID) | ORAL | Status: DC | PRN

## 2020-07-09 MED ORDER — FUROSEMIDE 10 MG/ML IJ SOLN
5.0000 mg/h | INTRAVENOUS | Status: DC
  Administered 2020-07-11: 5 mg/h via INTRAVENOUS
  Filled 2020-07-09 (×2): qty 25

## 2020-07-09 MED ORDER — ALBUMIN HUMAN 5 % IV SOLN
12.5000 g | Freq: Once | INTRAVENOUS | Status: AC
  Administered 2020-07-09: 12.5 g via INTRAVENOUS
  Filled 2020-07-09: qty 250

## 2020-07-09 MED ORDER — POTASSIUM CHLORIDE 20 MEQ/15ML (10%) PO SOLN
20.0000 meq | ORAL | Status: AC
  Administered 2020-07-09 (×3): 20 meq
  Filled 2020-07-09 (×3): qty 15

## 2020-07-09 MED ORDER — INSULIN ASPART 100 UNIT/ML ~~LOC~~ SOLN
0.0000 [IU] | SUBCUTANEOUS | Status: DC
  Administered 2020-07-09 – 2020-07-17 (×10): 2 [IU] via SUBCUTANEOUS

## 2020-07-09 MED ORDER — FUROSEMIDE 10 MG/ML IJ SOLN
INTRAMUSCULAR | Status: AC
  Filled 2020-07-09: qty 4

## 2020-07-09 MED ORDER — ACETAMINOPHEN 160 MG/5ML PO SOLN
1000.0000 mg | Freq: Four times a day (QID) | ORAL | Status: DC | PRN
  Administered 2020-07-09: 1000 mg
  Filled 2020-07-09: qty 40.6

## 2020-07-09 MED ORDER — FUROSEMIDE 10 MG/ML IJ SOLN
8.0000 mg/h | INTRAVENOUS | Status: DC
  Administered 2020-07-09: 8 mg/h via INTRAVENOUS
  Filled 2020-07-09: qty 25

## 2020-07-09 MED ORDER — POTASSIUM CHLORIDE 10 MEQ/50ML IV SOLN
10.0000 meq | INTRAVENOUS | Status: AC
  Administered 2020-07-09 (×2): 10 meq via INTRAVENOUS
  Filled 2020-07-09: qty 50

## 2020-07-09 MED ORDER — SODIUM CHLORIDE 0.9 % IV SOLN
2.0000 g | Freq: Two times a day (BID) | INTRAVENOUS | Status: DC
  Administered 2020-07-09: 2 g via INTRAVENOUS
  Filled 2020-07-09 (×3): qty 2

## 2020-07-09 MED ORDER — VANCOMYCIN HCL 750 MG/150ML IV SOLN
750.0000 mg | Freq: Two times a day (BID) | INTRAVENOUS | Status: DC
  Administered 2020-07-09 – 2020-07-12 (×6): 750 mg via INTRAVENOUS
  Filled 2020-07-09 (×7): qty 150

## 2020-07-09 MED ORDER — SODIUM CHLORIDE 0.9% IV SOLUTION: Freq: Once | INTRAVENOUS | Status: AC

## 2020-07-09 MED ORDER — MILRINONE LACTATE IN DEXTROSE 20-5 MG/100ML-% IV SOLN
0.2000 ug/kg/min | INTRAVENOUS | Status: DC
  Administered 2020-07-09 – 2020-07-11 (×4): 0.3 ug/kg/min via INTRAVENOUS
  Administered 2020-07-12 – 2020-07-16 (×6): 0.2 ug/kg/min via INTRAVENOUS
  Filled 2020-07-09 (×10): qty 100

## 2020-07-09 MED FILL — Heparin Sodium (Porcine) Inj 1000 Unit/ML: INTRAMUSCULAR | Qty: 1 | Status: AC

## 2020-07-09 MED FILL — Sodium Chloride IV Soln 0.9%: INTRAVENOUS | Qty: 3000 | Status: AC

## 2020-07-09 NOTE — Progress Notes (Signed)
EVENING ROUNDS NOTE :     [address]       [place] [id number]             [phone]                 1 Day Post-Op Procedure(s) (LRB): EXPLORATION POST OPERATIVE OPEN HEART TAMPONADE (N/A)   Total Length of Stay:  LOS: 14 days  Events:   Awake impella P8 flow of 4.2 Good hemodynamics Continue diuresis    BP (!) 52/42   Pulse 90   Temp (!) 102.4 F (39.1 C)   Resp 17   Ht 5\' 8"  (1.727 m)   Wt 85.1 kg   SpO2 97%   BMI 28.53 kg/m   PAP: (15-36)/(10-19) 24/14 CVP:  [6 mmHg-18 mmHg] 12 mmHg CO:  [3.8 L/min-6.3 L/min] 6.3 L/min CI:  [2.1 L/min/m2-3.4 L/min/m2] 3.4 L/min/m2  Vent Mode: SIMV;PRVC;PSV FiO2 (%):  [40 %-50 %] 50 % Set Rate:  [16 bmp] 16 bmp Vt Set:  [650 mL] 650 mL PEEP:  [5 cmH20] 5 cmH20 Pressure Support:  [10 cmH20] 10 cmH20 Plateau Pressure:  [15 cmH20-18 cmH20] 17 cmH20  . sodium chloride 20 mL/hr at 07/08/20 1317  . sodium chloride    . sodium chloride 10 mL/hr at 07/08/20 1950  . albumin human 12.5 g (07/09/20 0237)  . amiodarone 30 mg/hr (07/09/20 1400)  . ceFEPime (MAXIPIME) IV    . dexmedetomidine (PRECEDEX) IV infusion 1 mcg/kg/hr (07/09/20 1400)  . epinephrine 3.013 mcg/min (07/09/20 1400)  . fentaNYL infusion INTRAVENOUS 300 mcg/hr (07/09/20 1400)  . furosemide (LASIX) infusion Stopped (07/09/20 1305)  . impella catheter heparin 25 unit/mL in dextrose 5%    . lactated ringers    . lactated ringers    . lactated ringers 20 mL/hr at 07/09/20 1400  . milrinone 0.3 mcg/kg/min (07/09/20 1400)  . nitroGLYCERIN Stopped (07/07/20 2047)  . norepinephrine (LEVOPHED) Adult infusion 16 mcg/min (07/09/20 1400)  . vancomycin      I/O last 3 completed shifts: In: 13864.6 [I.V.:8547; Blood:3776.7; Other:302.2; NG/GT:160; IV Piggyback:1078.8] Out: 07/11/20; Emesis/NG output:475; Other:200; Blood:500; Chest Tube:2880]   CBC Latest Ref Rng & Units 07/09/2020 07/09/2020 07/08/2020  WBC 4.0 - 10.5 K/uL - 12.7(H) -   Hemoglobin 13.0 - 17.0 g/dL 12.2(L) 9.8(L) 7.5(L)  Hematocrit 39 - 52 % 36.0(L) 30.4(L) 22.0(L)  Platelets 150 - 400 K/uL - 55(L) -    BMP Latest Ref Rng & Units 07/09/2020 07/09/2020 07/08/2020  Glucose 70 - 99 mg/dL - 07/10/2020) -  BUN 6 - 20 mg/dL - 242(P) -  Creatinine 53(I - 1.24 mg/dL - 1.44) -  Sodium 3.15(Q - 145 mmol/L 144 143 147(H)  Potassium 3.5 - 5.1 mmol/L 3.8 3.7 3.9  Chloride 98 - 111 mmol/L - 110 -  CO2 22 - 32 mmol/L - 25 -  Calcium 8.9 - 10.3 mg/dL - 7.2(L) -    ABG    Component Value Date/Time   PHART 7.378 07/09/2020 0536   PCO2ART 45.1 07/09/2020 0536   PO2ART 82 (L) 07/09/2020 0536   HCO3 26.3 07/09/2020 0536   TCO2 28 07/09/2020 0536   ACIDBASEDEF 6.0 (H) 07/08/2020 1619   O2SAT 95.0 07/09/2020 0536       07/11/2020, MD 07/09/2020 4:51 PM

## 2020-07-09 NOTE — Progress Notes (Signed)
  Echocardiogram 2D Echocardiogram limited has been performed.  Jerry [name] M 07/09/2020, 9:59 AM

## 2020-07-09 NOTE — Care Plan (Signed)
Impella rep called , updated on pt's status, made recommendation to repeat Heparin level and look at Lasix drip in relation to CVP. Recommendations forwarded to [facility]

## 2020-07-09 NOTE — Progress Notes (Signed)
Pharmacy Antibiotic Note  Jerry [name] is a 49 y.o. 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 Pharmacy has been consulted for vancomycin and cefepime dosing with fevers. Pt previously received perioperative vancomycin (last dose 10/14) and cefuroxime. AKI appears to be resolving.  Plan: Cefepime 2g IV q12h Vancomycin 750mg  IV q12h Follow Cr, LOT, cultures  Height: 5\' 8"  (172.7 cm) Weight: 85.1 kg (187 lb 9.8 oz) IBW/kg (Calculated) : 68.4  Temp (24hrs), Avg:100.1 F (37.8 C), Min:96.8 F (36 C), Max:102.6 F (39.2 C)  Recent Labs  Lab 07/08/20 0013 07/08/20 0412 07/08/20 1057 07/08/20 1131 07/08/20 1341 07/08/20 1731 07/09/20 0206  WBC 24.7* 21.2*  --  23.6*  --  14.9* 12.7*  CREATININE 1.24 1.42*  --   --  2.00* 1.85* 1.59*  LATICACIDVEN  --   --  8.0*  --   --  3.8*  --     Estimated Creatinine Clearance: 59.7 mL/min (A) (by C-G formula based on SCr of 1.59 mg/dL (H)).    No Known Allergies    Thank you for allowing pharmacy to be a part of this patient's care.  07/10/20, PharmD, BCPS Clinical Pharmacist [phone] Please check AMION for all [facility] Pharmacy numbers 07/09/2020

## 2020-07-09 NOTE — Progress Notes (Addendum)
1 Day Post-Op Procedure(s) (LRB): EXPLORATION POST OPERATIVE OPEN HEART TAMPONADE (N/A) Subjective: Intubated, sedated Per RN was following commands and cooperative earlier this AM  Objective: Vital signs in last 24 hours: Temp:  [96.8 F (36 C)-101.7 F (38.7 C)] 101.7 F (38.7 C) (10/15 0733) Pulse Rate:  [79-89] 89 (10/15 0733) Cardiac Rhythm: Atrial paced (10/15 0400) Resp:  [14-21] 18 (10/15 0733) BP: (94-120)/(64-99) 120/75 (10/15 0700) SpO2:  [91 %-100 %] 97 % (10/15 0733) Arterial Line BP: (84-128)/(60-95) 93/65 (10/15 0700) FiO2 (%):  [40 %-50 %] 50 % (10/15 0733) Weight:  [85.1 kg] 85.1 kg (10/15 0500)  Hemodynamic parameters for last 24 hours: PAP: (15-37)/(10-27) 30/16 CVP:  [6 mmHg-26 mmHg] 11 mmHg CO:  [3.8 L/min-5.7 L/min] 5 L/min CI:  [2.1 L/min/m2-3.1 L/min/m2] 2.7 L/min/m2  Intake/Output from previous day: 10/14 0701 - 10/15 0700 In: 10374.6 [I.V.:6135.2; Blood:3085; NG/GT:160; IV Piggyback:800.1] Out: 3815 [Urine:900; Emesis/NG output:475; Blood:500; Chest Tube:1740] Intake/Output this shift: No intake/output data recorded.  General appearance: sedated Neurologic: see above Heart: regular rate and rhythm and + rub Lungs: clear anteriorly Extremities: cool  Lab Results: Recent Labs    07/08/20 1731 07/08/20 1747 07/09/20 0206 07/09/20 0536  WBC 14.9*  --  12.7*  --   HGB 7.9*   < > 9.8* 12.2*  HCT 24.7*   < > 30.4* 36.0*  PLT 51*  51*  --  55*  --    < > = values in this interval not displayed.   BMET:  Recent Labs    07/08/20 1731 07/08/20 1747 07/09/20 0206 07/09/20 0536  NA 144   < > 143 144  K 3.9   < > 3.7 3.8  CL 112*  --  110  --   CO2 24  --  25  --   GLUCOSE 130*  --  115*  --   BUN 22*  --  24*  --   CREATININE 1.85*  --  1.59*  --   CALCIUM 7.3*  --  7.2*  --    < > = values in this interval not displayed.    PT/INR:  Recent Labs    07/09/20 0206  LABPROT 24.3*  INR 2.3*   ABG    Component Value Date/Time    PHART 7.378 07/09/2020 0536   HCO3 26.3 07/09/2020 0536   TCO2 28 07/09/2020 0536   ACIDBASEDEF 6.0 (H) 07/08/2020 1619   O2SAT 95.0 07/09/2020 0536   CBG (last 3)  Recent Labs    07/09/20 0104 07/09/20 0332 07/09/20 0358  GLUCAP 114* 103* 94    Assessment/Plan: S/P Procedure(s) (LRB): EXPLORATION POST OPERATIVE OPEN HEART TAMPONADE (N/A) -POD # 2/1 CV- in SR, AP for rate  On milrinone 0.3, epi 3, levophed titrated for BP  Impella at p8 with flows of 4 L/min  C0 5, CI= 2.7, co-ox 59 RESP- CXR still has some edema, will start vent wean this AM RENAL-creatinine improved to 1.59. AKI, hopefully resolving  Acidosis improved- dc bicarb drip  Will try to diurese today GI- AST and ALT elevated, secondary to low output/ hepatic congestion= "shock liver" Anemia- secondary to ABL, Hgb 30, no signs of bleeding at present Thrombocytopenia- still present, no enoxaparin, will give platelets this AM and monitor ENDO- CBG well controlled CT with relatively high output- fluid is serous, slightly blood tinged   LOS: 14 days    Loreli Slot 07/09/2020

## 2020-07-09 NOTE — Progress Notes (Signed)
Patient ID: Jerry [name], male   DOB: 13-Apr-1971, 49 y.o.   MRN: [id number] P    Advanced Heart Failure Rounding Note  PCP-Cardiologist: Sherren Mocha, MD    Patient Profile   49 y/o male s/p recent back surgery, admitted for acute inferior and anterolateral MI. LHC showed thrombotic occlusion of distal RCA that was treated with PTCA and thrombectomy.  He was also noted to have chronic total occlusion of the LAD with collaterals from the right (thus this territory was affected by the RCA MI) as well as 90% complex proximal LCx stenosis. EF 25-30% + apical thrombus. RV normal. Course b/c cardiogenic shock and acute hypoxic respiratory failure. ? Component of septic shock.    Subjective:    - CABG + MV repair and placement of Impella 5.5 on 10/13.  - Back to OR on 10/14 with large hematoma obstructing right atrium.   He remains on milrinone 0.3, epinephrine 3, norepinephrine 16.  He is in NSR on IV amiodarone gtt. Creatinine stable at 1.59.   Swan numbers: CVP 15 PA 29/15 CI 2.7 Co-ox 59%  Impella 5.5 P8 with flow 4.1 L/min LDH 428  Objective:   Weight Range: 85.1 kg Body mass index is 28.53 kg/m.   Vital Signs:   Temp:  [96.8 F (36 C)-101.7 F (38.7 C)] 101.7 F (38.7 C) (10/15 0733) Pulse Rate:  [79-89] 89 (10/15 0733) Resp:  [14-21] 18 (10/15 0733) BP: (94-120)/(64-99) 120/75 (10/15 0700) SpO2:  [91 %-100 %] 97 % (10/15 0733) Arterial Line BP: (84-128)/(60-95) 93/65 (10/15 0700) FiO2 (%):  [40 %-50 %] 50 % (10/15 0733) Weight:  [85.1 kg] 85.1 kg (10/15 0500) Last BM Date: 07/06/20  Weight change: Filed Weights   07/06/20 0403 07/07/20 0618 07/09/20 0500  Weight: 71.4 kg 71 kg 85.1 kg    Intake/Output:   Intake/Output Summary (Last 24 hours) at 07/09/2020 0807 Last data filed at 07/09/2020 0700 Gross per 24 hour  Intake 9811.6 ml  Output 3755 ml  Net 6056.6 ml      Physical Exam   General: Sedated but awakens.  Neck: JVP 12+, no thyromegaly or  thyroid nodule.  Lungs: Decreased at bases.  CV: Nondisplaced PMI.  Heart regular S1/S2, no S3/S4, no murmur.  1+ ankle edema. Abdomen: Soft, nontender, no hepatosplenomegaly, no distention.  Skin: Intact without lesions or rashes.  Neurologic: Wakes up, follow commands. Extremities: No clubbing or cyanosis.  HEENT: Normal.    Telemetry   NSR 80s Personally reviewed  Labs    CBC Recent Labs    07/08/20 0013 07/08/20 0013 07/08/20 0412 07/08/20 0422 07/08/20 1731 07/08/20 1747 07/09/20 0206 07/09/20 0536  WBC 24.7*   < > 21.2*   < > 14.9*  --  12.7*  --   NEUTROABS 21.1*  --  17.5*  --   --   --   --   --   HGB 8.0*   < > 7.2*   < > 7.9*   < > 9.8* 12.2*  HCT 25.4*   < > 23.3*   < > 24.7*   < > 30.4* 36.0*  MCV 92.7   < > 92.5   < > 90.1  --  89.4  --   PLT 161   < > 155   < > 51*  51*  --  55*  --    < > = values in this interval not displayed.   Basic Metabolic Panel Recent Labs    07/08/20 0013  07/08/20 0013 07/08/20 0412 07/08/20 0422 07/08/20 1731 07/08/20 1747 07/09/20 0206 07/09/20 0536  NA 141   < > 139   < > 144   < > 143 144  K 5.2*   < > 4.5   < > 3.9   < > 3.7 3.8  CL 113*   < > 112*   < > 112*  --  110  --   CO2 19*   < > 16*   < > 24  --  25  --   GLUCOSE 182*   < > 186*   < > 130*  --  115*  --   BUN 16   < > 18   < > 22*  --  24*  --   CREATININE 1.24   < > 1.42*   < > 1.85*  --  1.59*  --   CALCIUM 7.9*   < > 7.5*   < > 7.3*  --  7.2*  --   MG 2.8*   < > 2.7*  --  2.3  --   --   --   PHOS 5.0*  --  4.6  --   --   --   --   --    < > = values in this interval not displayed.   Liver Function Tests Recent Labs    07/07/20 0120 07/08/20 0013 07/08/20 0412 07/09/20 0206  AST 36  --   --  2,131*  ALT 43  --   --  1,621*  ALKPHOS 55  --   --  30*  BILITOT 0.5  --   --  1.9*  PROT 6.3*  --   --  4.4*  ALBUMIN 2.7*   < > 2.9* 3.0*   < > = values in this interval not displayed.   No results for input(s): LIPASE, AMYLASE in the last 72  hours. Cardiac Enzymes No results for input(s): CKTOTAL, CKMB, CKMBINDEX, TROPONINI in the last 72 hours.  BNP: BNP (last 3 results) Recent Labs    06/25/20 2334 06/26/20 0150  BNP 349.1* 368.4*    ProBNP (last 3 results) No results for input(s): PROBNP in the last 8760 hours.   D-Dimer Recent Labs    07/08/20 1731  DDIMER 1.05*   Hemoglobin A1C Recent Labs    07/07/20 0120  HGBA1C 5.4   Fasting Lipid Panel No results for input(s): CHOL, HDL, LDLCALC, TRIG, CHOLHDL, LDLDIRECT in the last 72 hours. Thyroid Function Tests No results for input(s): TSH, T4TOTAL, T3FREE, THYROIDAB in the last 72 hours.  Invalid input(s): FREET3  Other results:   Imaging    DG Chest Port 1 View  Result Date: 07/09/2020 CLINICAL DATA:  Status post CABG EXAM: PORTABLE CHEST 1 VIEW COMPARISON:  07/08/2020 FINDINGS: Mild blunting of the left costophrenic angle, possibly reflecting a small pleural effusion. Mild perihilar edema. Indwelling left chest tube and two mediastinal drains. No pneumothorax is seen. Endotracheal tube terminates 5 cm above the carina. Right IJ Swan-Ganz catheter terminates in the right main pulmonary artery. Enteric tube courses into the stomach. Left arm PICC terminates in the lower SVC. The heart is top-normal in size. Postsurgical changes related to prior CABG. Midline stents staples. Median sternotomy. Surgical clips and skin staples overlying the right axilla. IMPRESSION: Possible small left pleural effusion with mild perihilar edema. Left chest tube and two mediastinal drains. No pneumothorax is seen. Endotracheal tube terminates 5 cm above the carina. Additional surgical apparatus as above.  Postsurgical changes related to prior CABG. Electronically Signed   By: Charline Bills M.D.   On: 07/09/2020 07:29   DG Chest Port 1 View  Result Date: 07/08/2020 CLINICAL DATA:  CABG. EXAM: PORTABLE CHEST 1 VIEW COMPARISON:  Chest x-ray from same day at 0527 hours.  FINDINGS: Unchanged endotracheal and enteric tubes. Unchanged right internal jugular Swan-Ganz catheter. Unchanged Impella device. Unchanged bilateral chest tubes and mediastinal drain. Unchanged left upper extremity PICC line. Stable cardiomediastinal silhouette status post CABG. Continued mild pulmonary vascular congestion without overt edema. Unchanged retrocardiac left lower lobe density with possible trace left pleural effusion. No pneumothorax. No acute osseous abnormality. IMPRESSION: 1. Stable lines and tubes. 2. Similar pulmonary vascular congestion. Unchanged left lower lobe atelectasis with probable trace left pleural effusion. Electronically Signed   By: Obie Dredge M.D.   On: 07/08/2020 16:42   ECHO TEE  Result Date: 07/08/2020    TRANSESOPHOGEAL ECHO REPORT   Patient Name:   Jerry [name] Date of Exam: 07/08/2020 Medical Rec #:  [id number]     Height:       68.0 in Accession #:    [id number]    Weight:       156.5 lb Date of Birth:  Mar 27, 1971     BSA:          1.842 m Patient Age:    49 years      BP:           106/64 mmHg Patient Gender: M             HR:           89 bpm. Exam Location:  Inpatient Procedure: Transesophageal Echo and Color Doppler Indications:     Mural thrombus of right atrium [[id number]]  History:         Patient has prior history of Echocardiogram examinations, most                  recent 07/08/2020. CAD, Prior CABG; Arrythmias:Atrial                  Fibrillation. Admitted for acute inferior and anterolateral MI.                  Cardiogenic shock and acute hypoxic respiratory failure,                  Impella. 10/3 LV thrombus. Post-infarct pericarditis. S/p back                  surgery.                   Mitral Valve: 23 mm Carbomedics prosthetic annuloplasty ring                  valve is present in the mitral position. Procedure Date:                  07/07/2020.  Sonographer:     Leta Jungling RDCS Referring Phys:  [id number] [facility] Diagnosing Phys: Marca Ancona  MD PROCEDURE: The transesophogeal probe was passed without difficulty through the esophogus of the patient. Sedation performed by performing physician. The patient developed no complications during the procedure. IMPRESSIONS  1. Left ventricular ejection fraction, by estimation, is 20%. The left ventricle has severely decreased function. The left ventricle demonstrates global hypokinesis. The left ventricular internal cavity size was mildly dilated. Impella catheter noted in  the left ventricle.  2. Right  ventricular systolic function is normal. The right ventricular size is normal.  3. No left atrial/left atrial appendage thrombus was detected.  4. There is a large hematoma in the pericardial space that compresses the right atrium with obstruction to right ventricular filling.  5. The mitral valve has been repaired. Trivial mitral valve regurgitation. No evidence of mitral stenosis.  6. The aortic valve is tricuspid. Aortic valve regurgitation is mild. No aortic stenosis is present. FINDINGS  Left Ventricle: Left ventricular ejection fraction, by estimation, is 20%. The left ventricle has severely decreased function. The left ventricle demonstrates global hypokinesis. The left ventricular internal cavity size was mildly dilated. Right Ventricle: The right ventricular size is normal. No increase in right ventricular wall thickness. Right ventricular systolic function is normal. Left Atrium: Left atrial size was normal in size. No left atrial/left atrial appendage thrombus was detected. Right Atrium: Right atrial size was normal in size. Pericardium: Trivial pericardial effusion is present. Mitral Valve: The mitral valve has been repaired/replaced. Trivial mitral valve regurgitation. No evidence of mitral valve stenosis. Tricuspid Valve: The tricuspid valve is normal in structure. Tricuspid valve regurgitation is not demonstrated. Aortic Valve: The aortic valve is tricuspid. Aortic valve regurgitation is mild. No  aortic stenosis is present. Pulmonic Valve: The pulmonic valve was not well visualized. Pulmonic valve regurgitation is not visualized. Aorta: The aortic root is normal in size and structure. IAS/Shunts: No atrial level shunt detected by color flow Doppler. Loralie Champagne MD Electronically signed by Loralie Champagne MD Signature Date/Time: 07/08/2020/4:48:27 PM    Final (Updated)    ECHOCARDIOGRAM LIMITED  Result Date: 07/08/2020    ECHOCARDIOGRAM REPORT   Patient Name:   Jerry [name] Date of Exam: 07/08/2020 Medical Rec #:  [id number]     Height:       68.0 in Accession #:    [id number]    Weight:       156.5 lb Date of Birth:  [birth year]     BSA:          1.842 m Patient Age:    76 years      BP:           104/88 mmHg Patient Gender: M             HR:           88 bpm. Exam Location:  Inpatient Procedure: Limited Echo and Limited Color Doppler Indications:     I50.23 Acute on chronic systolic (congestive) heart failure  History:         Patient has prior history of Echocardiogram examinations, most                  recent 07/07/2020. CAD, Prior CABG; Arrythmias:Atrial                  Fibrillation. Admitted for acute inferior and anterolateral MI.                  Cardiogenic shock and acute hypoxic respiratory failure,                  Impella. 10/3 LV thrombus. Post-infarct pericarditis. S/p back                  surgery.                   Mitral Valve: 23 mm Carbomedics prosthetic annuloplasty ring  valve is present in the mitral position. Procedure Date:                  07/07/2020.  Sonographer:     Darlina Sicilian RDCS Referring Phys:  [place] Diagnosing Phys: Loralie Champagne MD IMPRESSIONS  1. Impella catheter noted in LV, position adjusted during this study. Left ventricular ejection fraction, by estimation, is <20%. The left ventricle has severely decreased function. The left ventricle demonstrates global hypokinesis. The left ventricular internal cavity size was mildly dilated.  Left ventricular diastolic function could not be evaluated.  2. Right ventricular systolic function is mildly reduced. The right ventricular size is normal. Tricuspid regurgitation signal is inadequate for assessing PA pressure.  3. The mitral valve has been repaired. Trivial mitral valve regurgitation. No evidence of mitral stenosis.  4. The aortic valve is normal in structure. Aortic valve regurgitation is trivial. No aortic stenosis is present.  5. The inferior vena cava is dilated in size with <50% respiratory variability, suggesting right atrial pressure of 15 mmHg.  6. There is a large suspected pericardial space hematoma noted adjacent to the right atrium. This structure compresses the right atrium and causes obstruction to RV inflow. FINDINGS  Left Ventricle: Impella catheter noted in LV, position adjusted during this study. Left ventricular ejection fraction, by estimation, is <20%. The left ventricle has severely decreased function. The left ventricle demonstrates global hypokinesis. The left ventricular internal cavity size was mildly dilated. There is no left ventricular hypertrophy. Left ventricular diastolic function could not be evaluated. Right Ventricle: The right ventricular size is normal. No increase in right ventricular wall thickness. Right ventricular systolic function is mildly reduced. Tricuspid regurgitation signal is inadequate for assessing PA pressure. Left Atrium: Left atrial size was normal in size. Right Atrium: Right atrial size was normal in size. Pericardium: Trivial pericardial effusion is present. Mitral Valve: The mitral valve has been repaired/replaced. Trivial mitral valve regurgitation. There is a 23 mm Carbomedics prosthetic annuloplasty ring present in the mitral position. Procedure Date: 07/07/2020. No evidence of mitral valve stenosis. Tricuspid Valve: The tricuspid valve is normal in structure. Tricuspid valve regurgitation is not demonstrated. No evidence of tricuspid  stenosis. Aortic Valve: The aortic valve is normal in structure. Aortic valve regurgitation is trivial. No aortic stenosis is present. Pulmonic Valve: The pulmonic valve was normal in structure. Pulmonic valve regurgitation is not visualized. No evidence of pulmonic stenosis. Aorta: The aortic root is normal in size and structure. Venous: The inferior vena cava is dilated in size with less than 50% respiratory variability, suggesting right atrial pressure of 15 mmHg. IAS/Shunts: No atrial level shunt detected by color flow Doppler. Loralie Champagne MD Electronically signed by Loralie Champagne MD Signature Date/Time: 07/08/2020/4:54:18 PM    Final (Updated)      Medications:     Scheduled Medications: . sodium chloride   Intravenous Once  . aspirin EC  325 mg Oral Daily   Or  . aspirin  324 mg Per Tube Daily  . atorvastatin  80 mg Per Tube Daily  . bisacodyl  10 mg Oral Daily   Or  . bisacodyl  10 mg Rectal Daily  . chlorhexidine gluconate (MEDLINE KIT)  15 mL Mouth Rinse BID  . Chlorhexidine Gluconate Cloth  6 each Topical Daily  . clonazePAM  0.5 mg Per Tube TID BM  . docusate  200 mg Per Tube Daily  . furosemide  40 mg Intravenous Once  . insulin aspart  0-24  Units Subcutaneous Q4H  . mouth rinse  15 mL Mouth Rinse 10 times per day  . metoprolol tartrate  12.5 mg Oral BID   Or  . metoprolol tartrate  12.5 mg Per Tube BID  . pantoprazole sodium  40 mg Per Tube Daily  . potassium chloride  20 mEq Per Tube Q4H  . sodium chloride flush  10-40 mL Intracatheter Q12H  . sodium chloride flush  3 mL Intravenous Q12H  . traZODone  100 mg Per Tube QHS    Infusions: . sodium chloride 20 mL/hr at 07/08/20 1317  . sodium chloride    . sodium chloride 10 mL/hr at 07/08/20 1950  . albumin human 12.5 g (07/09/20 0237)  . albumin human    . amiodarone 30 mg/hr (07/09/20 0700)  . cefUROXime (ZINACEF)  IV Stopped (07/09/20 3664)  . dexmedetomidine (PRECEDEX) IV infusion 1 mcg/kg/hr (07/09/20 0700)   . epinephrine 3 mcg/min (07/09/20 0700)  . fentaNYL infusion INTRAVENOUS 325 mcg/hr (07/09/20 0700)  . furosemide (LASIX) infusion    . impella catheter heparin 25 unit/mL in dextrose 5%    . lactated ringers    . lactated ringers    . lactated ringers 20 mL/hr at 07/09/20 0700  . milrinone 0.3 mcg/kg/min (07/09/20 0700)  . nitroGLYCERIN Stopped (07/07/20 2047)  . norepinephrine (LEVOPHED) Adult infusion 16 mcg/min (07/09/20 0700)  . phenylephrine (NEO-SYNEPHRINE) Adult infusion Stopped (07/08/20 1035)  . potassium chloride    . vancomycin      PRN Medications: sodium chloride, acetaminophen **OR** acetaminophen (TYLENOL) oral liquid 160 mg/5 mL, albumin human, lactated ringers, LORazepam, metoprolol tartrate, midazolam, morphine injection, ondansetron (ZOFRAN) IV, oxyCODONE, sodium chloride flush, sodium chloride flush, traMADol    Assessment/Plan   1. CAD: Late presentation inferior MI.  Cath showed thrombotic occlusion distal RCA, CTO LAD with collaterals from right, and complex 90% proximal LCx stenosis.  He had PTCA/thrombectomy RCA with good flow at end of procedure.  Suspect he had damage to LAD territory as well as RCA territory as RCA collateralized the LAD.  Now s/p CABG with LIMA-LAD, left radial to OM, seq SVG-PDA/PLV - ASA/statin.  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 Suspect post-MI pericarditis. Symptoms resolved prior to surgery.   3. Acute systolic CHF>>Cardiogenic Shock: Echo with EF 25-30%, RV ok (no evidence for RV infarct), LV thrombus.  Repeat bedside echo 06/27/20  EF 25%, normal RV w/ no pericardial effusion, no LV thrombus seen on this echo. Now with post-op shock, has Impella 5.5 in place.  Had to return to OR on 10/14 with large pericardial space hematoma obstructing the right atrium, hemodynamics much better since removal.  He is on epinephrine 3, norepinephrine 16,  milrinone 0.3 with co-ox 59% and CI 2.7.  Impella at P8 with good flow, no alarms. CVP 15 this morning, I/Os +++.   - Continue to wean pressors as MAP tolerates.  - Lasix 40 mg IV then 8 mg/hr gtt.   - Limited echo to assess Impella position today.  - Heparin gtt for Impella, atrial fibrillation, and h/o LV thrombus when ok per TCTS.  4. AF with RVR: Noted 10/12, now in NSR on amiodarone.  - Continue amiodarone gtt.  5. LV thrombus: Resolved on 10/3 echo.  - Restart heparin as soon as feasible as above.  6. ID: Initial concern for RLL PNA. Completed 7 days vancomycin/cefepime.  Post-op fever to 101.7.  -  Culture sputum/blood and follow.  7. Thrombocytopenia: Post-op. 55K.  - Getting platelet transfusion today.  8. S/p back surgery: Micro-discectomy just prior to admission.  Neurosurgery following, appreciated.  9. Possible benzo/narcotic withdrawal: Concern GF brought him drugs pre-op.  10. Pericardial hematoma: Obstructed RA post-CABG, had to go back to OR on 10/14.   11. Anemia: Post-op blood loss.  - Transfuse to keep hgb > 8.   CRITICAL CARE Performed by: Loralie Champagne  Total critical care time: 40 minutes  Critical care time was exclusive of separately billable procedures and treating other patients.  Critical care was necessary to treat or prevent imminent or life-threatening deterioration.  Critical care was time spent personally by me on the following activities: development of treatment plan with patient and/or surrogate as well as nursing, discussions with consultants, evaluation of patient's response to treatment, examination of patient, obtaining history from patient or surrogate, ordering and performing treatments and interventions, ordering and review of laboratory studies, ordering and review of radiographic studies, pulse oximetry and re-evaluation of patient's condition.   Loralie Champagne, MD 07/09/2020 8:07 AM

## 2020-07-09 NOTE — Discharge Summary (Addendum)
Physician Discharge Summary  Jerry [name] [name] ID: Jerry [name] Jerry [name] [name] MRN: [id number] DOB/AGE: 1971-01-14 49 y.o.  Admit date: 06/25/2020 Discharge date: 07/21/2020  Admission Diagnoses:  Acute inferior and anterolateral ST elevation myocardial infarction complicated by acute left heart systolic failure and cardiogenic shock Left ventricular dysfunction Status post recent microdiscectomy by neurosurgery     Discharge Diagnoses:   Acute inferior and anterolateral ST elevation myocardial infarction Left ventricular dysfunction Postoperative coagulopathy Postoperative mediastinal hematoma with tamponade Postoperative thrombocytopenia with resolution prior to discharge Postoperative atrial fibrillation with resolution to sinus rhythm Postoperative pericarditis Acute  left heart systolic failure and cardiogenic shock Left ventricular thrombus History of multitrauma Status post recent microdiscectomy by neurosurgery Malnutrition of moderate degree S/P CABG x 4, mitral valve repair,  and placement of Impella 5.5 left ventricular assist device Pericardial tamponade Status post mediastinal exploration for evacuation of hematoma Status post removal of Impella left ventricular assist device  Discharged Condition: stable   History of Present Illness: Jerry [name] Jerry [name] [name] is a 49 yo man who underwent a microdiscectomy 2 days prior to admission. Presented to ED last night with > 12 hours of Cp. Initially tried to self medicate with antacids and lying down but pain persisted. In ED ECG showed inferior ST elevation.  Taken to cath lab emergently. Total occlusion of Jerry [name] RCA- opened but also CTO of LAD and severe circumflex disease. Not stented. Plan for medical tune up then CABG once optimized.  Hospital Course:   Jerry [name] Jerry [name] [name] was followed closely over Jerry [name] next several days.  He was noted to have an LV thrombus at Jerry [name] time of admission.  Repeat echo several days later showed this to have resolved.  He was  maintained on milrinone.  He was managed closely by Jerry [name] advanced heart failure team during Jerry [name] days leading up to surgery.  On 10/4, he developed respiratory distress due to pulmonary edema.  He required intubation and mechanical ventilation.  He was diuresed.  He was also treated with broad-spectrum antibiotics for suspected right lower lobe pneumonia.  His respiratory status improved allowing for weaning of mechanical ventilation and extubation on 06/30/2020.  Jerry [name] IV milrinone was continued.  His respiratory status gradually improved.  Repeat chest x-ray on 1010 was improved although there was continued mild pulmonary edema.  On 07/05/2020, he developed rapid atrial fibrillation.  He was started on amiodarone resulting in conversion back to sinus rhythm. On 07/07/2020, he was taken to Jerry [name] operating room where CABG x4 was carried out.  Jerry [name] left internal mammary artery was grafted to Jerry [name] left anterior descending coronary artery, left radial artery was grafted to Jerry [name] obtuse marginal coronary artery, and a saphenous vein was harvested sequentially to Jerry [name] posterior descending and posterior lateral coronary arteries.  He was noted on intraoperative transesophageal echo to have significant mitral insufficiency, for this reason, mitral valve annuloplasty was also performed utilizing a 32 mm [employer] annuloplasty ring.  Following Jerry [name] procedure, echocardiography showed severe left ventricular dysfunction as cardiopulmonary bypass support was decreased.  Jerry [name] [name] was placed back on full support and Dr. Roxan Hockey proceeded with placement of an Impella 5.5 left ventricular assist device.  This along with multiple inotropic and vasoactive infusions allow Jerry [name] Jerry [name] [name] separated from cardiopulmonary bypass.  He was transferred to Jerry [name] intensive care unit and critical but stable condition.  On postop day 1, Jerry [name] Impella was not functioning appropriately.  Echocardiogram showed an extracardiac thrombus pressing on Jerry [name] right  atrium.  This was creating tamponade physiology.  For this reason, Jerry [name] Jerry [name] [name] was returned  to Jerry [name] operating room for mediastinal exploration.  Jerry [name] Jerry [name] [name]'s hemodynamics improved significantly after removal of Jerry [name] clot.  An active bleed from a vein graft vasovasorum was also identified and controlled.  Following this, Jerry [name] Jerry [name] [name] was returned to Jerry [name] ICU.  On postop day 2, we began weaning vent support.  Jerry [name] [name] was awake and following commands.  He was maintaining excellent flows with Jerry [name] Impella and was stable hemodynamically on milrinone, epinephrine, and Levophed along with Impella LVAD support.  His respiratory status remained stable with mechanical ventilation.  He was eventually weaned and extubated on 07/13/2020.  Jerry [name] IP vasopressor and inotropic support was also gradually weaned and replaced with oral heart failure medications as managed by Jerry [name] advanced heart failure team.  By 1021, and successfully removed from Impella LVAD support and was thus returned to Jerry [name] operating room for removal of Jerry [name] device.  This proceeded without complication.  Please see Jerry [name] operative note for details.  He was mobilized with Jerry [name] assistance of physical therapy and made good progress.  He was transferred to 4 E., progressive care.  He was started on oral Coumadin for his LV thrombus.  INR was monitored and Jerry [name] pharmacy staff managed Coumadin dosing.  By Jerry [name] time of discharge, his INR was 2.4 after transfer to 4 E., he was activity level was advanced quickly and had no difficulty with his mobility he was tolerating a diet along with nutritional supplements without difficulty.  He had appropriate bowel bladder function.  His incisions showed evidence of appropriate healing with no signs of complication.  All skin staples were removed prior to discharge home.  By Jerry [name] time of his discharge, he was completely independent with his mobility.  He declined home health nursing and home physical therapy.  He did not have any durable  medical equipment needs.  His INR will be checked by Jerry [name] Coumadin clinic on 07/23/2020.  Consults: Advanced Heart Failure  Significant Diagnostic Studies:   Coronary/Graft Acute MI Revascularization  RIGHT HEART CATH 06/26/20  Conclusion  Hemodynamic data: RA: Mean 7 RV: 42/7 PA: 42/24 mean 33 Pulmonary capillary wedge pressure: A-wave 27, V wave 24, mean 26  Pulmonary artery oxygen saturation 59% Aortic oxygen saturation 94% (6 L O2)  Fick cardiac output 3.0 L/min, cardiac index 1.6  Thermodilution cardiac output 5.4 L/min, cardiac index 2.8  Plan: Start IV milrinone, diurese as tolerated, advanced heart failure team to see.  Discussed case with Dr. Aundra Dubin.  We will transition IV Aggrastat to IV heparin in Jerry [name] setting of recently diagnosed LV apical thrombus seen on echocardiogram this morning.   Coronary Thrombectomy  CORONARY BALLOON ANGIOPLASTY  LEFT HEART CATH AND CORONARY ANGIOGRAPHY  Conclusion  1.  Late presenting inferolateral STEMI with ongoing chest pain, secondary to thrombotic occlusion of Jerry [name] distal RCA, treated with balloon angioplasty and aspiration thrombectomy 2.  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  Severe complex stenosis of Jerry [name] proximal circumflex 4.  Severe global and segmental LV systolic dysfunction with severely elevated LVEDP of 31 mmHg  Jerry [name] Jerry [name] [name] has very severe multivessel coronary artery disease.  His presentation is complicated by acute systolic heart failure and late presenting MI.  Plan:   Diurese with IV furosemide  Continue IV Aggrastat  Consult cardiac surgery for consideration of multivessel CABG once he is stabilized, case discussed with Dr. Roxan Hockey  Case discussed with neurosurgery on-call since Jerry [name] Jerry [name] [name] is only 48 hours from  lumbar microdiscectomy.  He should have serial neurologic exams but reasonable to continue parenteral  anticoagulant/antiplatelet therapy as needed for his severe cardiac disease  Check 2D echo for full assessment of LV function and evaluation of valvular disease Surgeon Notes    07/16/2020 7:40 PM Operative Note filed by Melrose Nakayama, MD    07/15/2020 7:19 PM Brief Op Note signed by Melrose Nakayama, MD    07/15/2020 10:53 AM Operative Note signed by Melrose Nakayama, MD    07/08/2020 6:08 PM Operative Note filed by Melrose Nakayama, MD    07/08/2020 4:13 PM Brief Op Note signed by Melrose Nakayama, MD    07/08/2020 3:54 PM Operative Note - Scan signed by Default, Provider, MD    07/08/2020 12:44 PM CV Procedure signed by Larey Dresser, MD    07/07/2020 6:28 PM Brief Op Note addendum by Melrose Nakayama, MD    07/07/2020 6:24 PM Operative Note - Scan signed by Default, Provider, MD  Indications  ST elevation myocardial infarction involving right coronary artery ([place]) [I21.11 (ICD-10-CM)]  Procedural Details  Technical Details INDICATION: Late presenting inferolateral STEMI.  49 year old male who is 2 days status post lumbar microdiscectomy developed chest discomfort at 8 AM today.  His chest pain persisted throughout Jerry [name] day and he finally sought treatment in Jerry [name] emergency department after 11 PM tonight.  A code STEMI is called and he is brought emergently to Jerry [name] cardiac catheterization lab  PROCEDURAL DETAILS: Jerry [name] right wrist is prepped, draped, and anesthetized with 1% lidocaine. Using Jerry [name] modified Seldinger technique, a 5/6 French Slender sheath is introduced into Jerry [name] right radial artery. 3 mg of verapamil is administered through Jerry [name] sheath, weight-based unfractionated heparin was administered intravenously. Standard Judkins catheters are used for selective coronary angiography. LV pressure is recorded and an aortic valve pullback gradient is measured.  Left ventriculography is performed.  PCI is performed after Jerry [name] diagnostic procedure.   Unfractionated heparin is administered until therapeutic ACT is achieved.  Aggrastat is administered because of heavy thrombus burden.  Catheter exchanges are performed over an exchange length guidewire. There are no immediate procedural complications. A TR band is used for radial hemostasis at Jerry [name] completion of Jerry [name] procedure.  Jerry [name] Jerry [name] [name] was transferred to Jerry [name] post catheterization recovery area for further monitoring.    Estimated blood loss <50 mL.   During this procedure medications were administered to achieve and maintain moderate conscious sedation while Jerry [name] Jerry [name] [name]'s heart rate, blood pressure, and oxygen saturation were continuously monitored and I was present face-to-face 100% of this time.  Medications (Filter: Administrations occurring from 0004 to 0110 on 06/26/20) (important) Continuous medications are totaled by Jerry [name] amount administered until 06/26/20 0110.  lidocaine (PF) (XYLOCAINE) 1 % injection (mL) Total volume:  5 mL  Date/Time  Rate/Dose/Volume Action  06/26/20 0016  5 mL Given    Radial Cocktail/Verapamil only (mL) Total volume:  10 mL  Date/Time  Rate/Dose/Volume Action  06/26/20 0016  10 mL Given    Heparin (Porcine) in NaCl 1000-0.9 UT/500ML-% SOLN (mL) Total volume:  1,000 mL  Date/Time  Rate/Dose/Volume Action  06/26/20 0016  500 mL Given  0017  500 mL Given    midazolam (VERSED) injection (mg) Total dose:  1 mg  Date/Time  Rate/Dose/Volume Action  06/26/20 0017  1 mg Given    fentaNYL (SUBLIMAZE) injection (mcg) Total dose:  25 mcg  Date/Time  Rate/Dose/Volume Action  06/26/20 0018  25 mcg Given    heparin sodium (  porcine) injection (Units) Total dose:  7,000 Units  Date/Time  Rate/Dose/Volume Action  06/26/20 0018  4,000 Units Given  0033  3,000 Units Given    tirofiban (AGGRASTAT) infusion 50 mcg/mL 100 mL (mcg/kg/min) Total dose:  Cannot be calculated* Dosing weight:  80.7  *Continuous medication not stopped within Jerry [name] calculation  time range. Date/Time  Rate/Dose/Volume Action  06/26/20 0049  0.15 mcg/kg/min - 14.53 mL/hr New Bag/Given    tirofiban (AGGRASTAT) bolus via infusion (mcg/kg) Total dose:  2,017.5 mcg Dosing weight:  80.7  Date/Time  Rate/Dose/Volume Action  06/26/20 0044  2,017.5 mcg Given    iohexol (OMNIPAQUE) 350 MG/ML injection (mL) Total volume:  70 mL  Date/Time  Rate/Dose/Volume Action  06/26/20 0056  70 mL Given    acetaminophen (TYLENOL) tablet 650 mg (mg) Total dose:  Cannot be calculated* Dosing weight:  80.7  *Administration dose not documented Date/Time  Rate/Dose/Volume Action  06/26/20 0004  *Not included in total MAR Hold    ALPRAZolam (XANAX) tablet 0.25 mg (mg) Total dose:  Cannot be calculated* Dosing weight:  80.7  *Administration dose not documented Date/Time  Rate/Dose/Volume Action  06/26/20 0004  *Not included in total MAR Hold    aspirin EC tablet 81 mg (mg) Total dose:  Cannot be calculated* Dosing weight:  80.7  *Administration dose not documented Date/Time  Rate/Dose/Volume Action  06/26/20 0004  *Not included in total MAR Hold    atorvastatin (LIPITOR) tablet 80 mg (mg) Total dose:  Cannot be calculated* Dosing weight:  80.7  *Administration dose not documented Date/Time  Rate/Dose/Volume Action  06/26/20 0004  *Not included in total MAR Hold    metoprolol tartrate (LOPRESSOR) tablet 12.5 mg (mg) Total dose:  Cannot be calculated* Dosing weight:  80.7  *Administration dose not documented Date/Time  Rate/Dose/Volume Action  06/26/20 0004  *Not included in total MAR Hold    nitroGLYCERIN (NITROSTAT) SL tablet 0.4 mg (mg) Total dose:  Cannot be calculated*  *Administration dose not documented Date/Time  Rate/Dose/Volume Action  06/26/20 0004  *Not included in total MAR Hold    ondansetron (ZOFRAN) injection 4 mg (mg) Total dose:  Cannot be calculated* Dosing weight:  80.7  *Administration dose not documented Date/Time  Rate/Dose/Volume Action   06/26/20 0004  *Not included in total MAR Hold    zolpidem (AMBIEN) tablet 5 mg (mg) Total dose:  Cannot be calculated* Dosing weight:  80.7  *Administration dose not documented Date/Time  Rate/Dose/Volume Action  06/26/20 0004  *Not included in total MAR Hold    Sedation Time  Sedation Time Physician-1: 36 minutes 53 seconds  Contrast  Medication Name Total Dose  iohexol (OMNIPAQUE) 350 MG/ML injection 70 mL    Radiation/Fluoro  Fluoro time: 5 (min) DAP: 80998 (mGycm2) Cumulative Air Kerma: 479 (mGy)  Coronary Findings  Diagnostic Dominance: Right Left Main  Ost LM to Mid LM lesion is 30% stenosed.  Left Anterior Descending  LAD is 100% occluded. Jerry [name] apical LAD fills via a well-formed collateral from Jerry [name] first RV marginal branch  Collaterals  Dist LAD filled by collaterals from RV Branch.    Prox LAD to Mid LAD lesion is 100% stenosed. Angiographic appearance of a chronic total occlusion, just after Jerry [name] origin of Jerry [name] first septal perforator branch.  Left Circumflex  Ost Cx to Prox Cx lesion is 90% stenosed. Severe proximal circumflex stenosis present, first obtuse marginal appears graftable  Right Coronary Artery  Dist RCA lesion is 100% stenosed. Jerry [name] lesion is heavily thrombotic. Jerry [name] distal  RCA is 100% occluded with heavy thrombus  Intervention  Dist RCA lesion  Angioplasty  CATH LAUNCHER 6FR JR4 guide catheter was inserted. WIRE COUGAR XT STRL 190CM guidewire used to cross lesion. Balloon angioplasty was performed. Jerry [name] lesion is crossed with a cougar wire. Jerry [name] lesion is predilated with a 2.5 mm balloon then redilated with a 3.0 mm balloon for 60 second inflation of 8 atm. Following balloon angioplasty there is heavy residual thrombus and aspiration thrombectomy is performed with a Pronto catheter.  Post-Intervention Lesion Assessment  Jerry [name] intervention was successful. Pre-interventional TIMI flow is 0. Post-intervention TIMI flow is 3. No complications occurred at this  lesion.  There is a 30% residual stenosis post intervention.  Wall Motion  Resting               Left Heart  Left Ventricle There is severe left ventricular systolic dysfunction. LV end diastolic pressure is severely elevated. Jerry [name] left ventricular ejection fraction is 25-35% by visual estimate. Jerry [name] LV is poorly visualized but there appears to be severe global and segmental LV systolic dysfunction with LVEF approximately 30%  Coronary Diagrams  Diagnostic Dominance: Right  Intervention      Treatments:   OPERATIVE REPORT  DATE OF PROCEDURE:  07/07/2020  PREOPERATIVE DIAGNOSES:  Three-vessel coronary artery disease, status post ST elevation myocardial infarction with ischemic cardiomyopathy and cardiogenic shock.  POSTOPERATIVE DIAGNOSES:   1.  Three-vessel coronary artery disease, status post ST elevation myocardial infarction with ischemic cardiomyopathy and cardiogenic shock.  2.  Severe mitral regurgitation.  PROCEDURES:   1.  Median sternotomy.   2.  Extracorporeal circulation. 3.  Coronary artery bypass grafting x4 (left internal mammary artery to left anterior descending, left radial artery to obtuse marginal 1, sequential saphenous vein graft to posterior descending and posterolateral. 4.  Mitral valve annuloplasty with 32 mm [employer] AnnuloFlex angioplasty ring.   5.  Endoscopic vein harvest, left thigh.   6.  Placement of an Impella 5.5 via Gore-Tex graft in right axillary artery.  SURGEON:  Modesto Charon, MD  ASSISTANT:  Enid Cutter, PA-C  ANESTHESIA:  General.  FINDINGS:  Transesophageal echocardiography revealed severe left ventricular dysfunction, severe mitral regurgitation due to posterior annular dilatation, good quality target vessels, good quality conduits, mild to moderate MR post-bypass, good position  of Impella pump.  CLINICAL NOTE:  Jerry [name] Jerry [name] [name] is a 49 year old gentleman who presented with a ST elevation MI.  He  presented very late in Jerry [name] course of Jerry [name] MI.   He had total occlusion of his right coronary  acutely and chronic total occlusion of his LAD.  Presentation  was complicated by cardiogenic shock and pulmonary edema and later by severe pericarditis.  Jerry [name] Jerry [name] [name] had been managed medically up to this point and is now felt ready for surgical intervention, although this was a high risk procedure and likely will  need mechanical circulatory support.  Jerry [name] indications, risks, benefits, and alternatives were discussed in detail with Jerry [name] Jerry [name] [name].  He accepted Jerry [name] risks and agreed to proceed.   OPERATIVE REPORT  DATE OF PROCEDURE:  07/08/2020  PREOPERATIVE DIAGNOSIS:  Cardiac tamponade.  POSTOPERATIVE DIAGNOSIS:  Cardiac tamponade.  PROCEDURE:  Reexploration for tamponade and control of bleeding.  SURGEON:  Modesto Charon, MD  ASSISTANT:   Ara Kussmaul, RNFA.  ANESTHESIA:  General.  FINDINGS:  Large clot overlying right atrium.  Bleeding from a vasa vasorum on a vein graft.  Multiple other small bleeding sites noted.  Improved hemodynamics after removal of clot.  CLINICAL NOTE:  Jerry [name] Jerry [name] [name] is a 49 year old gentleman who had undergone coronary bypass grafting, mitral valve annuloplasty, and placement of an Impella device on 07/07/2020.  Jerry [name] morning of 07/08/2020, he became relatively unstable.  Echocardiogram  suggested that there might be clot within Jerry [name] right atrium.  A TEE was performed, which confirmed that there was a clot external to Jerry [name] right atrium causing impaired filling.  Jerry [name] Jerry [name] [name] needed urgent surgical intervention to relieve Jerry [name] cardiac  tamponade.  He was intubated and unable to give consent, but given Jerry [name] urgency of Jerry [name] situation, was taken to Jerry [name] operating room.  On arrival to Jerry [name] operating room, he was already intubated and had Impella support in place.  He was already sedated, but  general anesthesia was induced.  Intravenous antibiotics were administered.  Jerry [name] chest,  abdomen and thighs were prepped and draped in Jerry [name] usual sterile fashion.   OPERATIVE REPORT  DATE OF PROCEDURE:  07/15/2020  PREOPERATIVE DIAGNOSIS:  Heart failure with Impella left ventricular assist support.  POSTOPERATIVE DIAGNOSIS:  Heart failure with Impella left ventricular assist support.  PROCEDURE:  Removal of Impella left ventricular assist device.  SURGEON:  Modesto Charon, MD  ASSISTANT:  None.  ANESTHESIA:  General.  FINDINGS:  No change in hemodynamics with removal of Impella pump.  Ejection fraction 30% to 35% by transesophageal echocardiography.  CLINICAL NOTE:  Jerry [name] Jerry [name] [name] is a 49 year old man who presented with an acute MI.  He presented late in Jerry [name] course of Jerry [name] MI with shock and left ventricular failure.  His MI was also complicated by pericarditis.  He was managed medically and then  underwent coronary artery bypass grafting and mitral valve repair.  He had placement of an Impella 5.5 left ventricular assist device via a graft sewn to Jerry [name] axillary artery at Jerry [name] time of his open heart surgery.  He now has improved clinically and is  ready for removal of Jerry [name] device.  Jerry [name] indications, risks, benefits, and alternatives were discussed in detail with Jerry [name] Jerry [name] [name].  He understood and accepted Jerry [name] risks and agreed to proceed.   Discharge Exam: Blood pressure 97/72, pulse 100, temperature (!) 96.8 F (36 C), temperature source Oral, resp. rate 19, height $RemoveBe'5\' 8"'IeteQiHQi$  (1.727 m), weight 63.5 kg, SpO2 100 %.  Cardiovascular:  Stable SR,  ~100/min, no murmur Pulmonary: Clear to auscultation bilaterally Abdomen: Soft, non tender, bowel sounds present. Extremities: No lower extremity edema. Motor/sensory intact LUE  Wounds: Sternal and LUE wounds are clean and dry.  No erythema or signs of infection. Every other staple removed from Jerry [name] sternal incision.      Disposition:   Discharged to home in stable condition  Discharge Instructions    Amb Referral to Cardiac  Rehabilitation   Complete by: As directed    Diagnosis:  STEMI CABG     CABG X ___: 4   After initial evaluation and assessments completed: Virtual Based Care may be provided alone or in conjunction with Phase 2 Cardiac Rehab based on Jerry [name] [name] barriers.: Yes     Allergies as of 07/21/2020   No Known Allergies     Medication List    TAKE these medications   acetaminophen 325 MG tablet Commonly known as: TYLENOL Take 2 tablets (650 mg total) by mouth every 6 (six) hours as needed for mild pain.   ALPRAZolam 0.5 MG tablet Commonly known as: XANAX Take 0.5 mg by mouth at bedtime as needed for sleep.   amiodarone 200 MG tablet Commonly known as: PACERONE  Take 1 tablet (200 mg total) by mouth 2 (two) times daily.   aspirin 81 MG EC tablet Take 1 tablet (81 mg total) by mouth daily. Swallow whole. Start taking on: July 22, 2020   atorvastatin 80 MG tablet Commonly known as: LIPITOR Place 1 tablet (80 mg total) into feeding tube daily. Start taking on: July 22, 2020   carvedilol 3.125 MG tablet Commonly known as: COREG Take 1 tablet (3.125 mg total) by mouth 2 (two) times daily with a meal.   Centrum Ultra Mens Tabs Take 1 tablet by mouth.   colchicine 0.6 MG tablet Take 1 tablet (0.6 mg total) by mouth daily. Start taking on: July 22, 2020   digoxin 0.125 MG tablet Commonly known as: LANOXIN Take 1 tablet (0.125 mg total) by mouth daily. Start taking on: July 22, 2020   feeding supplement Liqd Take 237 mLs by mouth 3 (three) times daily between meals.   Narcan 4 MG/0.1ML Liqd nasal spray kit Generic drug: naloxone Place 1 spray into [name] nose once.   sacubitril-valsartan 24-26 MG Commonly known as: ENTRESTO Take 1 tablet by mouth 2 (two) times daily.   spironolactone 25 MG tablet Commonly known as: ALDACTONE Take 1 tablet (25 mg total) by mouth daily. Start taking on: July 22, 2020   traMADol 50 MG tablet Commonly known as: ULTRAM Take 1  tablet (50 mg total) by mouth every 6 (six) hours as needed for up to 7 days for moderate pain.   traZODone 100 MG tablet Commonly known as: DESYREL Take 100 mg by mouth at bedtime.   warfarin 2.5 MG tablet Commonly known as: COUMADIN Take 1 tablet (2.5 mg total) by mouth daily at 4 PM. Or as directed by [name].         Follow-up Information    [facility] Follow up on 08/02/2020.   Specialty: Cardiology Why: at [place] on [name] [address] [place] information: [place] [id number] [place] [phone]       [facility] Follow up on 07/23/2020.   Specialty: Cardiology Why: Coumadin Clinic at 2:30  Contact information: [address] [place]       Melrose Nakayama, MD Follow up.   Specialty: Cardiothoracic Surgery Why: Your appointment is on Tuesday, 08/17/2020 at 9:45 AM.  Please arrive 30 minutes early for a chest x-ray to be performed by [facility] located on [name] first floor of [name] same building. Contact information: [place] [address] [phone]              J[name] [name] has been discharged on:   1.Beta Blocker:  Yes [x   ]                              No   [   ]                              If No, reason:  2.Ace Inhibitor/ARB: Yes [ x  ]  No  [    ]                                     If No, reason:  3.Statin:   Yes [ x  ]                  No  [   ]                  If No, reason:  4.Ecasa:  Yes  [x  ]                  No   [   ]                  If No, reason:       Signed: Antony Odea, PA-C 07/21/2020, 10:30 AM

## 2020-07-09 NOTE — Addendum Note (Signed)
Addendum  created 07/09/20 0735 by Adair Laundry, CRNA   Order list changed

## 2020-07-09 NOTE — Anesthesia Postprocedure Evaluation (Signed)
Anesthesia Post Note  Patient: Engineer, site  Procedure(s) Performed: EXPLORATION POST OPERATIVE OPEN HEART TAMPONADE (N/A )     Patient location during evaluation: SICU Anesthesia Type: General Level of consciousness: sedated Pain management: pain level controlled Vital Signs Assessment: post-procedure vital signs reviewed and stable Respiratory status: patient remains intubated per anesthesia plan Cardiovascular status: stable Postop Assessment: no apparent nausea or vomiting Anesthetic complications: no   No complications documented.  Last Vitals:  Vitals:   07/09/20 0645 07/09/20 0700  BP:  120/75  Pulse: 89 89  Resp: 16 16  Temp: (!) 38.4 C (!) 38.5 C  SpO2: 96% 96%    Last Pain:  Vitals:   07/09/20 0400  TempSrc: Core  PainSc:                  Cecile Hearing

## 2020-07-10 ENCOUNTER — Inpatient Hospital Stay (HOSPITAL_COMMUNITY): Payer: 59

## 2020-07-10 DIAGNOSIS — R570 Cardiogenic shock: Secondary | ICD-10-CM | POA: Diagnosis not present

## 2020-07-10 DIAGNOSIS — Z951 Presence of aortocoronary bypass graft: Secondary | ICD-10-CM

## 2020-07-10 DIAGNOSIS — I2111 ST elevation (STEMI) myocardial infarction involving right coronary artery: Secondary | ICD-10-CM | POA: Diagnosis not present

## 2020-07-10 LAB — CBC
HCT: 28.7 % — ABNORMAL LOW (ref 39.0–52.0)
HCT: 28.3 % — ABNORMAL LOW (ref 39.0–52.0)
Hemoglobin: 9.1 g/dL — ABNORMAL LOW (ref 13.0–17.0)
Hemoglobin: 9 g/dL — ABNORMAL LOW (ref 13.0–17.0)
MCH: 29.4 pg (ref 26.0–34.0)
MCH: 29.1 pg (ref 26.0–34.0)
MCHC: 31.7 g/dL (ref 30.0–36.0)
MCHC: 31.8 g/dL (ref 30.0–36.0)
MCV: 92.6 fL (ref 80.0–100.0)
MCV: 91.6 fL (ref 80.0–100.0)
Platelets: 60 10*3/uL — ABNORMAL LOW (ref 150–400)
Platelets: 58 10*3/uL — ABNORMAL LOW (ref 150–400)
RBC: 3.1 MIL/uL — ABNORMAL LOW (ref 4.22–5.81)
RBC: 3.09 MIL/uL — ABNORMAL LOW (ref 4.22–5.81)
RDW: 14.7 % (ref 11.5–15.5)
RDW: 14.5 % (ref 11.5–15.5)
WBC: 10 10*3/uL (ref 4.0–10.5)
WBC: 11.2 10*3/uL — ABNORMAL HIGH (ref 4.0–10.5)
nRBC: 0 % (ref 0.0–0.2)
nRBC: 0 % (ref 0.0–0.2)

## 2020-07-10 LAB — COMPREHENSIVE METABOLIC PANEL
ALT: 1338 U/L — ABNORMAL HIGH (ref 0–44)
AST: 805 U/L — ABNORMAL HIGH (ref 15–41)
Albumin: 2.8 g/dL — ABNORMAL LOW (ref 3.5–5.0)
Alkaline Phosphatase: 39 U/L (ref 38–126)
Anion gap: 8 (ref 5–15)
BUN: 21 mg/dL — ABNORMAL HIGH (ref 6–20)
CO2: 29 mmol/L (ref 22–32)
Calcium: 7.3 mg/dL — ABNORMAL LOW (ref 8.9–10.3)
Chloride: 106 mmol/L (ref 98–111)
Creatinine, Ser: 1.4 mg/dL — ABNORMAL HIGH (ref 0.61–1.24)
GFR, Estimated: 59 mL/min — ABNORMAL LOW (ref >60)
Glucose, Bld: 120 mg/dL — ABNORMAL HIGH (ref 70–99)
Potassium: 3.9 mmol/L (ref 3.5–5.1)
Sodium: 143 mmol/L (ref 135–145)
Total Bilirubin: 3.9 mg/dL — ABNORMAL HIGH (ref 0.3–1.2)
Total Protein: 4.7 g/dL — ABNORMAL LOW (ref 6.5–8.1)

## 2020-07-10 LAB — TYPE AND SCREEN
ABO/RH(D): O POS
Antibody Screen: NEGATIVE
Unit division: 0
Unit division: 0
Unit division: 0
Unit division: 0
Unit division: 0
Unit division: 0
Unit division: 0
Unit division: 0
Unit division: 0

## 2020-07-10 LAB — BPAM RBC
Blood Product Expiration Date: 202111142359
Blood Product Expiration Date: 202111152359
Blood Product Expiration Date: 202111152359
Blood Product Expiration Date: 202111152359
Blood Product Expiration Date: 202111182359
Blood Product Expiration Date: 202111182359
Blood Product Expiration Date: 202111182359
Blood Product Expiration Date: 202111182359
Blood Product Expiration Date: 202111182359
ISSUE DATE / TIME: 202110131542
ISSUE DATE / TIME: 202110140512
ISSUE DATE / TIME: 202110140512
ISSUE DATE / TIME: 202110140512
ISSUE DATE / TIME: 202110141309
ISSUE DATE / TIME: 202110141309
ISSUE DATE / TIME: 202110141309
ISSUE DATE / TIME: 202110141309
ISSUE DATE / TIME: 202110141840
Unit Type and Rh: 5100
Unit Type and Rh: 5100
Unit Type and Rh: 5100
Unit Type and Rh: 5100
Unit Type and Rh: 5100
Unit Type and Rh: 5100
Unit Type and Rh: 5100
Unit Type and Rh: 5100
Unit Type and Rh: 5100

## 2020-07-10 LAB — POCT I-STAT 7, (LYTES, BLD GAS, ICA,H+H)
Acid-Base Excess: 7 mmol/L — ABNORMAL HIGH (ref 0.0–2.0)
Bicarbonate: 31.2 mmol/L — ABNORMAL HIGH (ref 20.0–28.0)
Calcium, Ion: 1.01 mmol/L — ABNORMAL LOW (ref 1.15–1.40)
HCT: 26 % — ABNORMAL LOW (ref 39.0–52.0)
Hemoglobin: 8.8 g/dL — ABNORMAL LOW (ref 13.0–17.0)
O2 Saturation: 99 %
Patient temperature: 38.4
Potassium: 3.8 mmol/L (ref 3.5–5.1)
Sodium: 143 mmol/L (ref 135–145)
TCO2: 32 mmol/L (ref 22–32)
pCO2 arterial: 44.5 mmHg (ref 32.0–48.0)
pH, Arterial: 7.458 — ABNORMAL HIGH (ref 7.350–7.450)
pO2, Arterial: 130 mmHg — ABNORMAL HIGH (ref 83.0–108.0)

## 2020-07-10 LAB — BPAM PLATELET PHERESIS
Blood Product Expiration Date: 202110172359
ISSUE DATE / TIME: 202110150850
Unit Type and Rh: 6200

## 2020-07-10 LAB — GLUCOSE, CAPILLARY
Glucose-Capillary: 103 mg/dL — ABNORMAL HIGH (ref 70–99)
Glucose-Capillary: 107 mg/dL — ABNORMAL HIGH (ref 70–99)
Glucose-Capillary: 112 mg/dL — ABNORMAL HIGH (ref 70–99)
Glucose-Capillary: 115 mg/dL — ABNORMAL HIGH (ref 70–99)
Glucose-Capillary: 119 mg/dL — ABNORMAL HIGH (ref 70–99)
Glucose-Capillary: 123 mg/dL — ABNORMAL HIGH (ref 70–99)

## 2020-07-10 LAB — MAGNESIUM
Magnesium: 2 mg/dL (ref 1.7–2.4)
Magnesium: 2.1 mg/dL (ref 1.7–2.4)

## 2020-07-10 LAB — COOXEMETRY PANEL
Carboxyhemoglobin: 0.9 % (ref 0.5–1.5)
Methemoglobin: 0.7 % (ref 0.0–1.5)
O2 Saturation: 56.7 %
Total hemoglobin: 13.8 g/dL (ref 12.0–16.0)

## 2020-07-10 LAB — BASIC METABOLIC PANEL
Anion gap: 7 (ref 5–15)
BUN: 21 mg/dL — ABNORMAL HIGH (ref 6–20)
CO2: 28 mmol/L (ref 22–32)
Calcium: 7.1 mg/dL — ABNORMAL LOW (ref 8.9–10.3)
Chloride: 104 mmol/L (ref 98–111)
Creatinine, Ser: 1.36 mg/dL — ABNORMAL HIGH (ref 0.61–1.24)
GFR, Estimated: >60 mL/min (ref >60)
Glucose, Bld: 111 mg/dL — ABNORMAL HIGH (ref 70–99)
Potassium: 3.9 mmol/L (ref 3.5–5.1)
Sodium: 139 mmol/L (ref 135–145)

## 2020-07-10 LAB — PREPARE PLATELET PHERESIS: Unit division: 0

## 2020-07-10 LAB — HEPARIN LEVEL (UNFRACTIONATED): Heparin Unfractionated: <0.1 IU/mL — ABNORMAL LOW (ref 0.30–0.70)

## 2020-07-10 LAB — LACTATE DEHYDROGENASE: LDH: 754 U/L — ABNORMAL HIGH (ref 98–192)

## 2020-07-10 LAB — LACTIC ACID, PLASMA: Lactic Acid, Venous: 1.4 mmol/L (ref 0.5–1.9)

## 2020-07-10 MED ORDER — SODIUM CHLORIDE 0.9 % IV SOLN
2.0000 g | Freq: Three times a day (TID) | INTRAVENOUS | Status: DC
  Administered 2020-07-10 – 2020-07-13 (×10): 2 g via INTRAVENOUS
  Filled 2020-07-10 (×9): qty 2

## 2020-07-10 MED ORDER — POTASSIUM CHLORIDE 20 MEQ/15ML (10%) PO SOLN
20.0000 meq | ORAL | Status: AC
  Administered 2020-07-10 (×3): 20 meq
  Filled 2020-07-10 (×3): qty 15

## 2020-07-10 NOTE — Progress Notes (Signed)
Pharmacy Antibiotic Note  Jerry [name] is a 49 y.o. 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 Pharmacy has been consulted for vancomycin and cefepime dosing with fevers. Pt previously received perioperative vancomycin (last dose 10/14) and cefuroxime.   Continues to be febrile (Tm 102.7), WBC wnl at 10, LA down to 1.4. AKI appears to be resolving, Scr down to 1.4 with CrCl 68 today.  Plan: Change Cefepime to 2g IV Q8 hrs Continue Vancomycin 750mg  IV Q12 hrs Follow Cr, LOT, cultures, vanc trough level at steady state  Height: 5\' 8"  (172.7 cm) Weight: 85.6 kg (188 lb 11.4 oz) IBW/kg (Calculated) : 68.4  Temp (24hrs), Avg:101.7 F (38.7 C), Min:100.8 F (38.2 C), Max:102.7 F (39.3 C)  Recent Labs  Lab 07/08/20 0412 07/08/20 0412 07/08/20 1057 07/08/20 1131 07/08/20 1341 07/08/20 1731 07/09/20 0206 07/09/20 1644 07/10/20 0417 07/10/20 0418  WBC 21.2*  --   --  23.6*  --  14.9* 12.7*  --  10.0  --   CREATININE 1.42*   < >  --   --  2.00* 1.85* 1.59* 1.64* 1.40*  --   LATICACIDVEN  --   --  8.0*  --   --  3.8*  --   --   --  1.4   < > = values in this interval not displayed.    Estimated Creatinine Clearance: 68 mL/min (A) (by C-G formula based on SCr of 1.4 mg/dL (H)).    No Known Allergies    07/12/20, PharmD PGY2 Cardiology Pharmacy Resident Phone: [phone] 07/10/2020  7:55 AM  Please check [email or web address] for unit-specific pharmacy phone numbers.

## 2020-07-10 NOTE — Plan of Care (Signed)
  Problem: Cardiac: Goal: Ability to achieve and maintain adequate cardiopulmonary perfusion will improve Outcome: Progressing Goal: Vascular access site(s) Level 0-1 will be maintained Outcome: Progressing   Problem: Education: Goal: Knowledge of General Education information will improve Description: Including pain rating scale, medication(s)/side effects and non-pharmacologic comfort measures Outcome: Progressing   Problem: Clinical Measurements: Goal: Ability to maintain clinical measurements within normal limits will improve Outcome: Progressing Goal: Will remain free from infection Outcome: Progressing Goal: Diagnostic test results will improve Outcome: Progressing Goal: Respiratory complications will improve Outcome: Progressing Goal: Cardiovascular complication will be avoided Outcome: Progressing   Problem: Coping: Goal: Level of anxiety will decrease Outcome: Progressing   Problem: Elimination: Goal: Will not experience complications related to bowel motility Outcome: Progressing Goal: Will not experience complications related to urinary retention Outcome: Progressing   Problem: Pain Managment: Goal: General experience of comfort will improve Outcome: Progressing   Problem: Safety: Goal: Ability to remain free from injury will improve Outcome: Progressing   Problem: Skin Integrity: Goal: Risk for impaired skin integrity will decrease Outcome: Progressing

## 2020-07-10 NOTE — Progress Notes (Signed)
Patient ID: Jerry [name], male   DOB: 03-Feb-1971, 49 y.o.   MRN: [id number] P    Advanced Heart Failure Rounding Note  PCP-Cardiologist: Sherren Mocha, MD    Patient Profile   49 y/o male s/p recent back surgery, admitted for acute inferior and anterolateral MI. LHC showed thrombotic occlusion of distal RCA that was treated with PTCA and thrombectomy.  He was also noted to have chronic total occlusion of the LAD with collaterals from the right (thus this territory was affected by the RCA MI) as well as 90% complex proximal LCx stenosis. EF 25-30% + apical thrombus. RV normal. Course b/c cardiogenic shock and acute hypoxic respiratory failure. ? Component of septic shock.    Subjective:    - CABG + MV repair and placement of Impella 5.5 on 10/13.  - Back to OR on 10/14 with large hematoma obstructing right atrium.   On vent FiO2 50%  Hemodynamics stable on milrinone 0.3, epinephrine 0.5, norepinephrine 16. Co-ox 57%   Remains in NSR on IV amiodarone gtt.  Febrile to 102  Excellent diuresis (7L out) on IV lasix but weight still up about 30 pounds. Creatinine slightly improved at 1.4   Swan numbers: CVP 8 PA 26/11 CO/CI 4.8/2.6 Co-ox 57%  Impella 5.5 P8 with flow 4.3 L/min Good waveforms LDH 428 -> 754  Objective:   Weight Range: 85.6 kg Body mass index is 28.69 kg/m.   Vital Signs:   Temp:  [100.8 F (38.2 C)-102.7 F (39.3 C)] 102 F (38.9 C) (10/16 1100) Pulse Rate:  [25-91] 69 (10/16 1100) Resp:  [15-28] 16 (10/16 1100) BP: (52-254)/(42-236) 106/76 (10/16 1100) SpO2:  [95 %-100 %] 100 % (10/16 1100) Arterial Line BP: (76-115)/(55-83) 89/68 (10/16 1100) FiO2 (%):  [50 %] 50 % (10/16 0738) Weight:  [85.6 kg] 85.6 kg (10/16 0600) Last BM Date: 07/06/20  Weight change: Filed Weights   07/07/20 0618 07/09/20 0500 07/10/20 0600  Weight: 71 kg 85.1 kg 85.6 kg    Intake/Output:   Intake/Output Summary (Last 24 hours) at 07/10/2020 1142 Last data filed at  07/10/2020 1100 Gross per 24 hour  Intake 4330.78 ml  Output 7575 ml  Net -3244.22 ml      Physical Exam   General: Sedated on vent HEENT: normal + ETT Neck: supple. RIJ swan Carotids 2+ bilat; no bruits. No lymphadenopathy or thryomegaly appreciated. Cor: R  impella chest dressing ok  + CTs.  Regular  + rub. impella hum Lungs: clear Abdomen: soft, nontender, nondistended. No hepatosplenomegaly. No bruits or masses. Good bowel sounds. Extremities: no cyanosis, clubbing, rash, 2+ edema Neuro: sedated on vent  Telemetry   NSR 70s Personally reviewed  Labs    CBC Recent Labs    07/08/20 0013 07/08/20 0013 07/08/20 0412 07/08/20 0422 07/09/20 0206 07/09/20 0536 07/10/20 0417 07/10/20 0615  WBC 24.7*   < > 21.2*   < > 12.7*  --  10.0  --   NEUTROABS 21.1*  --  17.5*  --   --   --   --   --   HGB 8.0*   < > 7.2*   < > 9.8*   < > 9.1* 8.8*  HCT 25.4*   < > 23.3*   < > 30.4*   < > 28.7* 26.0*  MCV 92.7   < > 92.5   < > 89.4  --  92.6  --   PLT 161   < > 155   < > 55*  --  60*  --    < > = values in this interval not displayed.   Basic Metabolic Panel Recent Labs    07/08/20 0013 07/08/20 0013 07/08/20 0412 07/08/20 0422 07/08/20 1731 07/08/20 1747 07/09/20 1644 07/09/20 1644 07/10/20 0417 07/10/20 0615  NA 141   < > 139   < > 144   < > 145   < > 143 143  K 5.2*   < > 4.5   < > 3.9   < > 4.8   < > 3.9 3.8  CL 113*   < > 112*   < > 112*   < > 109  --  106  --   CO2 19*   < > 16*   < > 24   < > 28  --  29  --   GLUCOSE 182*   < > 186*   < > 130*   < > 143*  --  120*  --   BUN 16   < > 18   < > 22*   < > 25*  --  21*  --   CREATININE 1.24   < > 1.42*   < > 1.85*   < > 1.64*  --  1.40*  --   CALCIUM 7.9*   < > 7.5*   < > 7.3*   < > 7.2*  --  7.3*  --   MG 2.8*   < > 2.7*   < > 2.3  --   --   --  2.0  --   PHOS 5.0*  --  4.6  --   --   --   --   --   --   --    < > = values in this interval not displayed.   Liver Function Tests Recent Labs    07/09/20 0206  07/10/20 0417  AST 2,131* 805*  ALT 1,621* 1,338*  ALKPHOS 30* 39  BILITOT 1.9* 3.9*  PROT 4.4* 4.7*  ALBUMIN 3.0* 2.8*   No results for input(s): LIPASE, AMYLASE in the last 72 hours. Cardiac Enzymes No results for input(s): CKTOTAL, CKMB, CKMBINDEX, TROPONINI in the last 72 hours.  BNP: BNP (last 3 results) Recent Labs    06/25/20 2334 06/26/20 0150  BNP 349.1* 368.4*    ProBNP (last 3 results) No results for input(s): PROBNP in the last 8760 hours.   D-Dimer Recent Labs    07/08/20 1731  DDIMER 1.05*   Hemoglobin A1C No results for input(s): HGBA1C in the last 72 hours. Fasting Lipid Panel No results for input(s): CHOL, HDL, LDLCALC, TRIG, CHOLHDL, LDLDIRECT in the last 72 hours. Thyroid Function Tests No results for input(s): TSH, T4TOTAL, T3FREE, THYROIDAB in the last 72 hours.  Invalid input(s): FREET3  Other results:   Imaging    DG Chest Port 1 View  Result Date: 07/10/2020 CLINICAL DATA:  Status post CABG EXAM: PORTABLE CHEST 1 VIEW COMPARISON:  07/09/2020 FINDINGS: Endotracheal tube terminates 7 cm above the carina. Enteric tube courses into the stomach with its side port at the GE junction. Right IJ Swan-Ganz catheter terminates in the right main pulmonary artery. Bilateral chest tubes and mediastinal drain. No pneumothorax is seen. Suspected small left pleural effusion with left lower lobe opacity, likely atelectasis. No frank interstitial edema. Postsurgical changes of the left lung apex. Skin staples with surgical clips overlying the right axilla. The heart is normal in size. Postsurgical changes related to prior CABG. Skin staples overlying the  midline chest.  Median sternotomy. IMPRESSION: Bilateral chest tubes and mediastinal drain. No pneumothorax is seen. Endotracheal tube terminates 7 cm above the carina. Additional support apparatus as above. Small left pleural effusion. Associated left lower lobe opacity, likely atelectasis. Postsurgical changes  related to prior CABG. Electronically Signed   By: Julian Hy M.D.   On: 07/10/2020 08:47     Medications:     Scheduled Medications: . aspirin EC  325 mg Oral Daily   Or  . aspirin  324 mg Per Tube Daily  . atorvastatin  80 mg Per Tube Daily  . bisacodyl  10 mg Oral Daily   Or  . bisacodyl  10 mg Rectal Daily  . chlorhexidine gluconate (MEDLINE KIT)  15 mL Mouth Rinse BID  . Chlorhexidine Gluconate Cloth  6 each Topical Daily  . clonazePAM  0.5 mg Per Tube TID BM  . docusate  200 mg Per Tube Daily  . insulin aspart  0-24 Units Subcutaneous Q4H  . mouth rinse  15 mL Mouth Rinse 10 times per day  . metoprolol tartrate  12.5 mg Oral BID   Or  . metoprolol tartrate  12.5 mg Per Tube BID  . pantoprazole sodium  40 mg Per Tube Daily  . potassium chloride  20 mEq Per Tube Q4H  . sodium chloride flush  10-40 mL Intracatheter Q12H  . sodium chloride flush  3 mL Intravenous Q12H  . traZODone  100 mg Per Tube QHS    Infusions: . sodium chloride 20 mL/hr at 07/08/20 1317  . sodium chloride    . sodium chloride 10 mL/hr at 07/08/20 1950  . albumin human 12.5 g (07/09/20 0237)  . amiodarone 30 mg/hr (07/10/20 1100)  . ceFEPime (MAXIPIME) IV Stopped (07/10/20 6222)  . dexmedetomidine (PRECEDEX) IV infusion 1.2 mcg/kg/hr (07/10/20 1100)  . epinephrine 0.5 mcg/min (07/10/20 1100)  . fentaNYL infusion INTRAVENOUS 350 mcg/hr (07/10/20 1100)  . furosemide (LASIX) infusion 5 mg/hr (07/10/20 1100)  . impella catheter heparin 25 unit/mL in dextrose 5%    . lactated ringers    . lactated ringers    . lactated ringers 20 mL/hr at 07/10/20 1100  . milrinone 0.3 mcg/kg/min (07/10/20 1100)  . nitroGLYCERIN Stopped (07/07/20 2047)  . norepinephrine (LEVOPHED) Adult infusion 16 mcg/min (07/10/20 1100)  . vancomycin Stopped (07/10/20 9798)    PRN Medications: sodium chloride, acetaminophen, albumin human, lactated ringers, LORazepam, metoprolol tartrate, midazolam, morphine injection,  ondansetron (ZOFRAN) IV, oxyCODONE, sodium chloride flush, sodium chloride flush, traMADol    Assessment/Plan   1. CAD: Late presentation inferior MI.  Cath showed thrombotic occlusion distal RCA, CTO LAD with collaterals from right, and complex 90% proximal LCx stenosis.  He had PTCA/thrombectomy RCA with good flow at end of procedure.  Suspect he had damage to LAD territory as well as RCA territory as RCA collateralized the LAD.  Now s/p CABG with LIMA-LAD, left radial to OM, seq SVG-PDA/PLV - ASA/statin.  - no s/s ischemia 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 Suspect post-MI pericarditis. Symptoms resolved prior to surgery.  - has recurrent rub on exam post-op.   3. Acute systolic CHF>>Cardiogenic Shock: Echo with EF 25-30%, RV ok (no evidence for RV infarct), LV thrombus.  Repeat bedside echo 06/27/20  EF 25%, normal RV w/ no pericardial effusion, no LV thrombus seen on this echo. Now with post-op shock, has Impella 5.5 in place.  Had to return to OR on 10/14 with large pericardial space hematoma obstructing the right atrium, hemodynamics much better since removal.  He is on epinephrine 0.5, norepinephrine 16, milrinone 0.3 with co-ox 57% and CI 2.6.  Impella at P8 with good flow, no alarms. Good waveforms. Low intrinsic pulsativity. CVP 8-10 weight remains up and is edematous on exam  - Continue to wean pressors as MAP tolerates. Epi stopped today - Continue lasix gtt. .  - Heparin gtt for Impella, atrial fibrillation, and h/o LV thrombus when ok per TCTS.  4. AF with RVR: Noted 10/12, now in NSR on amiodarone.  - Continue amio gtt 5. LV thrombus: Resolved on 10/3 echo.  - Restart heparin as soon as feasible as above.  6. ID: Initial concern for RLL PNA. Completed 7 days vancomycin/cefepime.  Post-op fever to 101.7.  - Culture sputum/blood and follow.  7. Thrombocytopenia: Post-op. 55K.  - PLTs up  to 60K today. No bleeding 8. S/p back surgery: Micro-discectomy just prior to admission.  Neurosurgery following, appreciated.  9. Possible benzo/narcotic withdrawal: Concern GF brought him drugs pre-op.  10. Pericardial hematoma: Obstructed RA post-CABG, had to go back to OR on 10/14.   11. Anemia: Post-op blood loss.  - Transfuse to keep hgb > 8.  12. Acute hypoxic respiratory failure - remains intubated post surgery - not ready for extubation yet   D/w Dr. Kipp Brood   CRITICAL CARE Performed by: Glori Bickers  Total critical care time: 35 minutes  Critical care time was exclusive of separately billable procedures and treating other patients.  Critical care was necessary to treat or prevent imminent or life-threatening deterioration.  Critical care was time spent personally by me on the following activities: development of treatment plan with patient and/or surrogate as well as nursing, discussions with consultants, evaluation of patient's response to treatment, examination of patient, obtaining history from patient or surrogate, ordering and performing treatments and interventions, ordering and review of laboratory studies, ordering and review of radiographic studies, pulse oximetry and re-evaluation of patient's condition.   Glori Bickers, MD 07/10/2020 11:42 AM

## 2020-07-10 NOTE — Progress Notes (Signed)
[address]       [place] [address]             [phone]                 2 Days Post-Op Procedure(s) (LRB): EXPLORATION POST OPERATIVE OPEN HEART TAMPONADE (N/A)   Events: No events _______________________________________________________________ Vitals: BP 100/71   Pulse 89   Temp (!) 101.8 F (38.8 C)   Resp 16   Ht 5\' 8"  (1.727 m)   Wt 85.6 kg   SpO2 100%   BMI 28.69 kg/m   - Neuro: sedated.  arousable  - Cardiovascular: sinus in 70s  Drips: epi 0.5, levo 13, milr 0.3, amio 30, lasix 5  Impella P8 flow of 4.1 PAP: (18-33)/(8-22) 27/13 CVP:  [0 mmHg-58 mmHg] 7 mmHg CO:  [5.3 L/min-7.3 L/min] 6.4 L/min CI:  [2.9 L/min/m2-4 L/min/m2] 3.5 L/min/m2  - Pulm:  Vent Mode: SIMV;PRVC;PSV FiO2 (%):  [50 %] 50 % Set Rate:  [16 bmp] 16 bmp Vt Set:  [650 mL] 650 mL PEEP:  [5 cmH20] 5 cmH20 Pressure Support:  [10 cmH20] 10 cmH20 Plateau Pressure:  [15 cmH20-21 cmH20] 21 cmH20  ABG    Component Value Date/Time   PHART 7.458 (H) 07/10/2020 0615   PCO2ART 44.5 07/10/2020 0615   PO2ART 130 (H) 07/10/2020 0615   HCO3 31.2 (H) 07/10/2020 0615   TCO2 32 07/10/2020 0615   ACIDBASEDEF 6.0 (H) 07/08/2020 1619   O2SAT 99.0 07/10/2020 0615    - Abd: soft - Extremity: cool.  .Intake/Output      10/15 0701 - 10/16 0700 10/16 0701 - 10/17 0700   I.V. (mL/kg) 3041.6 (35.5) 410.9 (4.8)   Blood 284    Other 714.3 34.7   NG/GT 160    IV Piggyback 499.7 100.1   Total Intake(mL/kg) 4699.6 (54.9) 545.7 (6.4)   Urine (mL/kg/hr) 5095 (2.5) 935 (2.9)   Emesis/NG output 1100 0   Other     Blood     Chest Tube 920 140   Total Output 7115 1075   Net -2415.4 -529.3           _______________________________________________________________ Labs: CBC Latest Ref Rng & Units 07/10/2020 07/10/2020 07/09/2020  WBC 4.0 - 10.5 K/uL - 10.0 -  Hemoglobin 13.0 - 17.0 g/dL 07/11/2020) 0.1(X) 12.2(L)  Hematocrit 39 - 52 % 26.0(L) 28.7(L) 36.0(L)  Platelets 150 -  400 K/uL - 60(L) -   CMP Latest Ref Rng & Units 07/10/2020 07/10/2020 07/09/2020  Glucose 70 - 99 mg/dL - 07/11/2020) 903(E)  BUN 6 - 20 mg/dL - 092(Z) 30(Q)  Creatinine 0.61 - 1.24 mg/dL - 76(A) 2.63(F)  Sodium 135 - 145 mmol/L 143 143 145  Potassium 3.5 - 5.1 mmol/L 3.8 3.9 4.8  Chloride 98 - 111 mmol/L - 106 109  CO2 22 - 32 mmol/L - 29 28  Calcium 8.9 - 10.3 mg/dL - 7.3(L) 7.2(L)  Total Protein 6.5 - 8.1 g/dL - 4.7(L) -  Total Bilirubin 0.3 - 1.2 mg/dL - 3.9(H) -  Alkaline Phos 38 - 126 U/L - 39 -  AST 15 - 41 U/L - 805(H) -  ALT 0 - 44 U/L - 1,338(H) -    CXR: PVCcongestion Small effusions  _______________________________________________________________  Assessment and Plan: POD 3 s/p CABG, impella, POD 2 s/p re-exploration  Neuro: sedated.  Hx of drug use.  Very anxious CV: will wean epi today.  Continue levo, amio, milr, and lasix.  Continue impella support.   Pulm: will keep tubes for now.  Wean vent as tolerated Renal: creat trending down.  Continue lasix gtt.   GI: LFTs trending down Heme: stable ID: afebrile Endo: insulin gtt Dispo: continue ICU care  Brynda Greathouse, MD 07/10/2020 10:46 AM

## 2020-07-10 NOTE — Progress Notes (Signed)
ANTICOAGULATION CONSULT NOTE  Pharmacy Consult for Heparin Indication: Impella  No Known Allergies  Patient Measurements: Height: 5\' 8"  (172.7 cm) Weight: 85.6 kg (188 lb 11.4 oz) IBW/kg (Calculated) : 68.4 Heparin Dosing Weight: 71 kg  Vital Signs: Temp: 101.3 F (38.5 C) (10/16 0700) Temp Source: Core (10/16 0000) BP: 107/87 (10/16 0700) Pulse Rate: 89 (10/16 0700)  Labs: Recent Labs    07/08/20 0412 07/08/20 0422 07/08/20 1131 07/08/20 1338 07/08/20 1731 07/08/20 1747 07/09/20 0206 07/09/20 0206 07/09/20 0536 07/09/20 0536 07/09/20 1644 07/10/20 0417 07/10/20 0418 07/10/20 0615  HGB 7.2*   < > 9.0*   < > 7.9*   < > 9.8*   < > 12.2*   < >  --  9.1*  --  8.8*  HCT 23.3*   < > 28.4*   < > 24.7*   < > 30.4*   < > 36.0*  --   --  28.7*  --  26.0*  PLT 155   < > 104*   < > 51*  51*  --  55*  --   --   --   --  60*  --   --   APTT  --   --  60*  --  58*  --  39*  --   --   --   --   --   --   --   LABPROT  --   --  37.3*  --  23.8*  --  24.3*  --   --   --   --   --   --   --   INR  --   --  3.9*  --  2.2*  --  2.3*  --   --   --   --   --   --   --   HEPARINUNFRC <0.10*  --   --   --   --   --  <0.10*  --   --   --   --   --  <0.10*  --   CREATININE 1.42*  --   --    < > 1.85*   < > 1.59*  --   --   --  1.64* 1.40*  --   --    < > = values in this interval not displayed.    Estimated Creatinine Clearance: 68 mL/min (A) (by C-G formula based on SCr of 1.4 mg/dL (H)).   Medical History: Past Medical History:  Diagnosis Date  . Anxiety   . Coronary artery disease   . Myocardial infarction [facility])     Assessment: 49 yo M post-CABG and MV repair with Impella support post-op. Patient was previously on systemic heparin pre-op for LV thrombus on ECHO 10/2 which was resolved on repeat ECHO on 10/4.   Impella currently on P8 with heparin purge solution. Purge flow is stable at 11.6 ml/hr (290 units/hr heparin). No systemic heparin for now.  HL undetectable, LDH back  down 1027>754, plts up slightly 55>60, Hgb stable 9s. No bleeding per discussion with nursing.  Goal of Therapy:  Not titrating to goal at this time Monitor platelets by anticoagulation protocol: Yes   Plan:  Continue heparin 25 unit/ml purge solution  Follow-up restarting systemic IV heparin  Monitor HL, CBC, s/sx bleeding  12/4, PharmD PGY2 Cardiology Pharmacy Resident Phone: [phone] 07/10/2020  8:25 AM  Please check [email or web address] for unit-specific pharmacy phone numbers.

## 2020-07-11 DIAGNOSIS — R570 Cardiogenic shock: Secondary | ICD-10-CM | POA: Diagnosis not present

## 2020-07-11 DIAGNOSIS — I2111 ST elevation (STEMI) myocardial infarction involving right coronary artery: Secondary | ICD-10-CM | POA: Diagnosis not present

## 2020-07-11 LAB — BASIC METABOLIC PANEL
Anion gap: 9 (ref 5–15)
BUN: 19 mg/dL (ref 6–20)
CO2: 28 mmol/L (ref 22–32)
Calcium: 7.1 mg/dL — ABNORMAL LOW (ref 8.9–10.3)
Chloride: 98 mmol/L (ref 98–111)
Creatinine, Ser: 1.16 mg/dL (ref 0.61–1.24)
GFR, Estimated: >60 mL/min (ref >60)
Glucose, Bld: 130 mg/dL — ABNORMAL HIGH (ref 70–99)
Potassium: 4.1 mmol/L (ref 3.5–5.1)
Sodium: 135 mmol/L (ref 135–145)

## 2020-07-11 LAB — COMPREHENSIVE METABOLIC PANEL
ALT: 1350 U/L — ABNORMAL HIGH (ref 0–44)
AST: 767 U/L — ABNORMAL HIGH (ref 15–41)
Albumin: 2.6 g/dL — ABNORMAL LOW (ref 3.5–5.0)
Alkaline Phosphatase: 48 U/L (ref 38–126)
Anion gap: 9 (ref 5–15)
BUN: 20 mg/dL (ref 6–20)
CO2: 29 mmol/L (ref 22–32)
Calcium: 7.2 mg/dL — ABNORMAL LOW (ref 8.9–10.3)
Chloride: 100 mmol/L (ref 98–111)
Creatinine, Ser: 1.21 mg/dL (ref 0.61–1.24)
GFR, Estimated: >60 mL/min (ref >60)
Glucose, Bld: 111 mg/dL — ABNORMAL HIGH (ref 70–99)
Potassium: 3.8 mmol/L (ref 3.5–5.1)
Sodium: 138 mmol/L (ref 135–145)
Total Bilirubin: 4.3 mg/dL — ABNORMAL HIGH (ref 0.3–1.2)
Total Protein: 4.8 g/dL — ABNORMAL LOW (ref 6.5–8.1)

## 2020-07-11 LAB — CBC
HCT: 29.2 % — ABNORMAL LOW (ref 39.0–52.0)
HCT: 29.6 % — ABNORMAL LOW (ref 39.0–52.0)
Hemoglobin: 9.4 g/dL — ABNORMAL LOW (ref 13.0–17.0)
Hemoglobin: 9.4 g/dL — ABNORMAL LOW (ref 13.0–17.0)
MCH: 29.7 pg (ref 26.0–34.0)
MCH: 28.7 pg (ref 26.0–34.0)
MCHC: 32.2 g/dL (ref 30.0–36.0)
MCHC: 31.8 g/dL (ref 30.0–36.0)
MCV: 92.1 fL (ref 80.0–100.0)
MCV: 90.5 fL (ref 80.0–100.0)
Platelets: 57 10*3/uL — ABNORMAL LOW (ref 150–400)
Platelets: 66 10*3/uL — ABNORMAL LOW (ref 150–400)
RBC: 3.17 MIL/uL — ABNORMAL LOW (ref 4.22–5.81)
RBC: 3.27 MIL/uL — ABNORMAL LOW (ref 4.22–5.81)
RDW: 14.3 % (ref 11.5–15.5)
RDW: 13.9 % (ref 11.5–15.5)
WBC: 12.1 10*3/uL — ABNORMAL HIGH (ref 4.0–10.5)
WBC: 12.1 10*3/uL — ABNORMAL HIGH (ref 4.0–10.5)
nRBC: 0 % (ref 0.0–0.2)
nRBC: 0 % (ref 0.0–0.2)

## 2020-07-11 LAB — LACTATE DEHYDROGENASE: LDH: 704 U/L — ABNORMAL HIGH (ref 98–192)

## 2020-07-11 LAB — GLUCOSE, CAPILLARY
Glucose-Capillary: 105 mg/dL — ABNORMAL HIGH (ref 70–99)
Glucose-Capillary: 109 mg/dL — ABNORMAL HIGH (ref 70–99)
Glucose-Capillary: 110 mg/dL — ABNORMAL HIGH (ref 70–99)
Glucose-Capillary: 115 mg/dL — ABNORMAL HIGH (ref 70–99)
Glucose-Capillary: 124 mg/dL — ABNORMAL HIGH (ref 70–99)
Glucose-Capillary: 49 mg/dL — ABNORMAL LOW (ref 70–99)
Glucose-Capillary: 97 mg/dL (ref 70–99)

## 2020-07-11 LAB — COOXEMETRY PANEL
Carboxyhemoglobin: 0.9 % (ref 0.5–1.5)
Methemoglobin: 0.7 % (ref 0.0–1.5)
O2 Saturation: 56.2 %
Total hemoglobin: 8.9 g/dL — ABNORMAL LOW (ref 12.0–16.0)

## 2020-07-11 LAB — HEPARIN LEVEL (UNFRACTIONATED): Heparin Unfractionated: <0.1 IU/mL — ABNORMAL LOW (ref 0.30–0.70)

## 2020-07-11 LAB — MAGNESIUM
Magnesium: 1.9 mg/dL (ref 1.7–2.4)
Magnesium: 1.8 mg/dL (ref 1.7–2.4)

## 2020-07-11 MED ORDER — VITAL 1.5 CAL PO LIQD
1000.0000 mL | ORAL | Status: DC
  Administered 2020-07-11 – 2020-07-12 (×2): 1000 mL
  Filled 2020-07-11: qty 1000

## 2020-07-11 MED ORDER — HEPARIN SODIUM (PORCINE) 5000 UNIT/ML IJ SOLN: 25000.0000 [IU] | INTRAVENOUS | Status: DC

## 2020-07-11 MED ORDER — HEPARIN SODIUM (PORCINE) 5000 UNIT/ML IJ SOLN
25000.0000 [IU] | INTRAVENOUS | Status: DC
  Administered 2020-07-12: 25000 [IU]
  Filled 2020-07-11 (×2): qty 5

## 2020-07-11 MED ORDER — VITAL HIGH PROTEIN PO LIQD: 1000.0000 mL | ORAL | Status: DC

## 2020-07-11 MED ORDER — POTASSIUM CHLORIDE 20 MEQ/15ML (10%) PO SOLN
20.0000 meq | ORAL | Status: AC
  Administered 2020-07-11 (×3): 20 meq
  Filled 2020-07-11 (×3): qty 15

## 2020-07-11 NOTE — Progress Notes (Addendum)
Brief Nutrition Note  Consult received for enteral/tube feeding initiation and management. Per consult, "Pt remains on pressors. Please start low dose trophic feeds."  Pt last seen by RD on 07/08/20. Tube feeding recommendations left at time time were Vital 1.5 @ 50 ml/hr with ProSource TF 90 ml BID. RD will order Vital 1.5 @ 20 ml/hr. Full assessment to follow.  Admitting Dx: STEMI (ST elevation myocardial infarction) [facility]) [I21.3] STEMI involving right coronary artery (HCC) [I21.11] S/P CABG x 4 [Z95.1]  Labs:  Recent Labs  Lab 07/08/20 0013 07/08/20 0013 07/08/20 2440 07/08/20 0422 07/10/20 0417 07/10/20 0417 07/10/20 0615 07/10/20 1659 07/11/20 0332 07/11/20 0833  NA 141   < > 139   < > 143   < > 143 139  --  138  K 5.2*   < > 4.5   < > 3.9   < > 3.8 3.9  --  3.8  CL 113*   < > 112*   < > 106  --   --  104  --  100  CO2 19*   < > 16*   < > 29  --   --  28  --  29  BUN 16   < > 18   < > 21*  --   --  21*  --  20  CREATININE 1.24   < > 1.42*   < > 1.40*  --   --  1.36*  --  1.21  CALCIUM 7.9*   < > 7.5*   < > 7.3*  --   --  7.1*  --  7.2*  MG 2.8*   < > 2.7*   < > 2.0  --   --  2.1 1.9  --   PHOS 5.0*  --  4.6  --   --   --   --   --   --   --   GLUCOSE 182*   < > 186*   < > 120*  --   --  111*  --  111*   < > = values in this interval not displayed.     Earma Reading, MS, RD, LDN Inpatient Clinical Dietitian Please see AMiON for contact information.

## 2020-07-11 NOTE — Plan of Care (Signed)
  Problem: Cardiac: Goal: Ability to achieve and maintain adequate cardiopulmonary perfusion will improve Outcome: Progressing Goal: Vascular access site(s) Level 0-1 will be maintained Outcome: Progressing   Problem: Clinical Measurements: Goal: Ability to maintain clinical measurements within normal limits will improve Outcome: Progressing Goal: Will remain free from infection Outcome: Progressing Goal: Diagnostic test results will improve Outcome: Progressing Goal: Respiratory complications will improve Outcome: Progressing Goal: Cardiovascular complication will be avoided Outcome: Progressing   Problem: Coping: Goal: Level of anxiety will decrease Outcome: Progressing   Problem: Elimination: Goal: Will not experience complications related to bowel motility Outcome: Progressing Goal: Will not experience complications related to urinary retention Outcome: Progressing   Problem: Pain Managment: Goal: General experience of comfort will improve Outcome: Progressing   Problem: Safety: Goal: Ability to remain free from injury will improve Outcome: Progressing   Problem: Skin Integrity: Goal: Risk for impaired skin integrity will decrease Outcome: Progressing

## 2020-07-11 NOTE — Progress Notes (Signed)
ANTICOAGULATION CONSULT NOTE  Pharmacy Consult for Heparin Indication: Impella  No Known Allergies  Patient Measurements: Height: 5\' 8"  (172.7 cm) Weight: 83.1 kg (183 lb 3.2 oz) IBW/kg (Calculated) : 68.4 Heparin Dosing Weight: 71 kg  Vital Signs: Temp: 100.8 F (38.2 C) (10/17 0730) Temp Source: Core (10/17 0800) BP: 125/84 (10/17 0700) Pulse Rate: 74 (10/17 0730)  Labs: Recent Labs    07/08/20 1131 07/08/20 1338 07/08/20 1731 07/08/20 1747 07/09/20 0206 07/09/20 0206 07/09/20 0536 07/09/20 1644 07/10/20 0417 07/10/20 0417 07/10/20 0418 07/10/20 0615 07/10/20 0615 07/10/20 1659 07/11/20 0332 07/11/20 0333  HGB 9.0*   < > 7.9*   < > 9.8*   < >   < >  --  9.1*   < >  --  8.8*   < > 9.0* 9.4*  --   HCT 28.4*   < > 24.7*   < > 30.4*   < >   < >  --  28.7*   < >  --  26.0*  --  28.3* 29.2*  --   PLT 104*   < > 51*  51*   < > 55*   < >  --   --  60*  --   --   --   --  58* 57*  --   APTT 60*  --  58*  --  39*  --   --   --   --   --   --   --   --   --   --   --   LABPROT 37.3*  --  23.8*  --  24.3*  --   --   --   --   --   --   --   --   --   --   --   INR 3.9*  --  2.2*  --  2.3*  --   --   --   --   --   --   --   --   --   --   --   HEPARINUNFRC  --   --   --   --  <0.10*  --   --   --   --   --  <0.10*  --   --   --   --  <0.10*  CREATININE  --    < > 1.85*   < > 1.59*  --    < > 1.64* 1.40*  --   --   --   --  1.36*  --   --    < > = values in this interval not displayed.    Estimated Creatinine Clearance: 69 mL/min (A) (by C-G formula based on SCr of 1.36 mg/dL (H)).   Medical History: Past Medical History:  Diagnosis Date  . Anxiety   . Coronary artery disease   . Myocardial infarction [facility])     Assessment: 49 yo M post-CABG and MV repair with Impella support post-op. Patient was previously on systemic heparin pre-op for LV thrombus on ECHO 10/2 which was resolved on repeat ECHO on 10/4.   Impella currently on P8 with heparin purge solution. Purge  flow is stable at 11.7 ml/hr (292.5 units/hr heparin). No systemic heparin for now.  HL undetectable, LDH down slightly 704, plts down slightly to 57, Hgb stable 9s. No bleeding per discussion with nursing.  Goal of Therapy:  Not titrating to goal at this time Monitor platelets by  anticoagulation protocol: Yes   Plan:  Continue heparin 25 unit/ml purge solution  Follow-up restarting systemic IV heparin  Monitor HL, CBC, s/sx bleeding  Tama Headings, PharmD PGY2 Cardiology Pharmacy Resident Phone: [phone] 07/11/2020  8:34 AM  Please check [email or web address] for unit-specific pharmacy phone numbers.

## 2020-07-11 NOTE — Progress Notes (Signed)
Patient ID: Jerry [name], male   DOB: 11-04-1970, 49 y.o.   MRN: [id number] P    Advanced Heart Failure Rounding Note  PCP-Cardiologist: Sherren Mocha, MD    Patient Profile   49 y/o male s/p recent back surgery, admitted for acute inferior and anterolateral MI. LHC showed thrombotic occlusion of distal RCA that was treated with PTCA and thrombectomy.  He was also noted to have chronic total occlusion of the LAD with collaterals from the right (thus this territory was affected by the RCA MI) as well as 90% complex proximal LCx stenosis. EF 25-30% + apical thrombus. RV normal. Course b/c cardiogenic shock and acute hypoxic respiratory failure. ? Component of septic shock.    Subjective:    - CABG + MV repair and placement of Impella 5.5 on 10/13.  - Back to OR on 10/14 with large hematoma obstructing right atrium.   Remains on vent FiO2 50%. Will arouse.   Hemodynamics stable on milrinone 0.3, norepinephrine 13. Impella at P-8  Epi off Co-ox 56%   Remains in NSR on IV amiodarone gtt.   Dumped some clots from CTs overnight but now stable. Mild serous drainage.   Tmax 102.2  Continues to diurese on lasix gtt at 5. Weight down another 6 pounds. (183 pounds) Pre-op was 157   [place]wan numbers: CVP 4 PA 23/9 CO/CI 5.1/2.8 Co-ox 56%  Impella 5.5 P8 with flow 4.2 L/min Good waveforms. Low pulsativity LDH 428 -> 754 -> 704  Objective:   Weight Range: 83.1 kg Body mass index is 27.86 kg/m.   Vital Signs:   Temp:  [97.2 F (36.2 C)-102.2 F (39 C)] 101.1 F (38.4 C) (10/17 1200) Pulse Rate:  [34-111] 74 (10/17 1200) Resp:  [15-25] 16 (10/17 1200) BP: (89-138)/(57-96) 128/91 (10/17 1200) SpO2:  [99 %-100 %] 100 % (10/17 1200) Arterial Line BP: (75-134)/(60-88) 101/75 (10/17 1200) FiO2 (%):  [50 %] 50 % (10/17 1200) Weight:  [83.1 kg] 83.1 kg (10/17 0526) Last BM Date: 07/06/20  Weight change: Filed Weights   07/09/20 0500 07/10/20 0600 07/11/20 0526  Weight: 85.1 kg 85.6  kg 83.1 kg    Intake/Output:   Intake/Output Summary (Last 24 hours) at 07/11/2020 1215 Last data filed at 07/11/2020 1200 Gross per 24 hour  Intake 3926.29 ml  Output 6001 ml  Net -2074.71 ml      Physical Exam   General: Sedated on vent but will arouse HEENT: normal Neck: supple. RIJ swan Carotids 2+ bilat; no bruits. No lymphadenopathy or thryomegaly appreciated. Cor: PMI nondisplaced.  Chest wound ok + CTs Regular rate & rhythm. + rub R  Impella site ok Lungs: clear Abdomen: soft, nontender, nondistended. No hepatosplenomegaly. No bruits or masses. Good bowel sounds. Extremities: no cyanosis, clubbing, rash, 1+ edema Neuro: Sedated on vent but will arouse  Telemetry   NSR 70-80s Personally reviewed  Labs    CBC Recent Labs    07/10/20 1659 07/11/20 0332  WBC 11.2* 12.1*  HGB 9.0* 9.4*  HCT 28.3* 29.2*  MCV 91.6 92.1  PLT 58* 57*   Basic Metabolic Panel Recent Labs    07/10/20 1659 07/11/20 0332 07/11/20 0833  NA 139  --  138  K 3.9  --  3.8  CL 104  --  100  CO2 28  --  29  GLUCOSE 111*  --  111*  BUN 21*  --  20  CREATININE 1.36*  --  1.21  CALCIUM 7.1*  --  7.2*  MG  2.1 1.9  --    Liver Function Tests Recent Labs    07/10/20 0417 07/11/20 0833  AST 805* 767*  ALT 1,338* 1,350*  ALKPHOS 39 48  BILITOT 3.9* 4.3*  PROT 4.7* 4.8*  ALBUMIN 2.8* 2.6*   No results for input(s): LIPASE, AMYLASE in the last 72 hours. Cardiac Enzymes No results for input(s): CKTOTAL, CKMB, CKMBINDEX, TROPONINI in the last 72 hours.  BNP: BNP (last 3 results) Recent Labs    06/25/20 2334 06/26/20 0150  BNP 349.1* 368.4*    ProBNP (last 3 results) No results for input(s): PROBNP in the last 8760 hours.   D-Dimer Recent Labs    07/08/20 1731  DDIMER 1.05*   Hemoglobin A1C No results for input(s): HGBA1C in the last 72 hours. Fasting Lipid Panel No results for input(s): CHOL, HDL, LDLCALC, TRIG, CHOLHDL, LDLDIRECT in the last 72 hours. Thyroid  Function Tests No results for input(s): TSH, T4TOTAL, T3FREE, THYROIDAB in the last 72 hours.  Invalid input(s): FREET3  Other results:   Imaging    No results found.   Medications:     Scheduled Medications: . aspirin EC  325 mg Oral Daily   Or  . aspirin  324 mg Per Tube Daily  . atorvastatin  80 mg Per Tube Daily  . bisacodyl  10 mg Oral Daily   Or  . bisacodyl  10 mg Rectal Daily  . chlorhexidine gluconate (MEDLINE KIT)  15 mL Mouth Rinse BID  . Chlorhexidine Gluconate Cloth  6 each Topical Daily  . clonazePAM  0.5 mg Per Tube TID BM  . docusate  200 mg Per Tube Daily  . insulin aspart  0-24 Units Subcutaneous Q4H  . mouth rinse  15 mL Mouth Rinse 10 times per day  . pantoprazole sodium  40 mg Per Tube Daily  . potassium chloride  20 mEq Per Tube Q4H  . sodium chloride flush  10-40 mL Intracatheter Q12H  . sodium chloride flush  3 mL Intravenous Q12H  . traZODone  100 mg Per Tube QHS    Infusions: . sodium chloride 20 mL/hr at 07/08/20 1317  . sodium chloride    . sodium chloride 10 mL/hr at 07/08/20 1950  . albumin human 12.5 g (07/09/20 0237)  . amiodarone 30 mg/hr (07/11/20 1200)  . ceFEPime (MAXIPIME) IV Stopped (07/11/20 0650)  . dexmedetomidine (PRECEDEX) IV infusion 1.2 mcg/kg/hr (07/11/20 1200)  . epinephrine Stopped (07/10/20 1100)  . feeding supplement (VITAL 1.5 CAL) 20 mL/hr at 07/11/20 1200  . fentaNYL infusion INTRAVENOUS 375 mcg/hr (07/11/20 1200)  . furosemide (LASIX) infusion 5 mg/hr (07/11/20 1200)  . impella catheter heparin 25 unit/mL in dextrose 5%    . lactated ringers    . lactated ringers    . lactated ringers 20 mL/hr at 07/11/20 1200  . milrinone 0.3 mcg/kg/min (07/11/20 1200)  . nitroGLYCERIN Stopped (07/07/20 2047)  . norepinephrine (LEVOPHED) Adult infusion 13 mcg/min (07/11/20 1200)  . vancomycin Stopped (07/11/20 7341)    PRN Medications: sodium chloride, acetaminophen, albumin human, lactated ringers, LORazepam,  metoprolol tartrate, midazolam, morphine injection, ondansetron (ZOFRAN) IV, oxyCODONE, sodium chloride flush, sodium chloride flush, traMADol    Assessment/Plan   1. CAD: Late presentation inferior MI.  Cath showed thrombotic occlusion distal RCA, CTO LAD with collaterals from right, and complex 90% proximal LCx stenosis.  He had PTCA/thrombectomy RCA with good flow at end of procedure.  Suspect he had damage to LAD territory as well as RCA territory as RCA  collateralized the LAD.  Now s/p CABG with LIMA-LAD, left radial to OM, seq SVG-PDA/PLV - ASA/statin.  -no s/s ischemia  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 Suspect post-MI pericarditis. Symptoms resolved prior to surgery.  - has recurrent rub on exam post-op.  No change 3. Acute systolic CHF>>Cardiogenic Shock: Echo with EF 25-30%, RV ok (no evidence for RV infarct), LV thrombus.  Repeat bedside echo 06/27/20  EF 25%, normal RV w/ no pericardial effusion, no LV thrombus seen on this echo. Now with post-op shock, has Impella 5.5 in place.  Had to return to OR on 10/14 with large pericardial space hematoma obstructing the right atrium, hemodynamics much better since removal.  He is on norepinephrine 13, milrinone 0.3 with co-ox 56% and CI 2.8.  Impella at P8 with good flow, no alarms. Good waveforms. Low intrinsic pulsativity. CVP 5 weight remains up and have some dependent edema on exam - Continue to wean NE as MAP tolerates. - Continue lasix gtt. Weight still up about 25 pounds from pre-op - Heparin gtt for Impella, atrial fibrillation, and h/o LV thrombus when ok per TCTS. LDH up but starting to come down 4. AF with RVR: Noted 10/12, now in NSR on amiodarone.  - Continue amio gtt 5. LV thrombus: Resolved on 10/3 echo.  - Restart heparin as soon as feasible as above.  6. ID: Initial concern for RLL PNA. Completed 7 days vancomycin/cefepime.  Post-op  fever to 101.7.  - Culture sputum/blood and follow.  7. Thrombocytopenia: Post-op. 55K.  - PLTs stable at 57k today. No bleeding 8. S/p back surgery: Micro-discectomy just prior to admission.  Neurosurgery following, appreciated.  9. Possible benzo/narcotic withdrawal: Concern GF brought him drugs pre-op.  10. Pericardial hematoma: Obstructed RA post-CABG, had to go back to OR on 10/14.   11. Anemia: Post-op blood loss.  - Transfuse to keep hgb > 8.  12. Acute hypoxic respiratory failure - remains intubated post surgery - not ready for extubation yet.  13. Transaminitis - likely shock liver - trending down slowly   D/w Dr. Kipp Brood   CRITICAL CARE Performed by: Glori Bickers  Total critical care time: 40 minutes  Critical care time was exclusive of separately billable procedures and treating other patients.  Critical care was necessary to treat or prevent imminent or life-threatening deterioration.  Critical care was time spent personally by me on the following activities: development of treatment plan with patient and/or surrogate as well as nursing, discussions with consultants, evaluation of patient's response to treatment, examination of patient, obtaining history from patient or surrogate, ordering and performing treatments and interventions, ordering and review of laboratory studies, ordering and review of radiographic studies, pulse oximetry and re-evaluation of patient's condition.   Glori Bickers, MD 07/11/2020 12:15 PM

## 2020-07-11 NOTE — Progress Notes (Signed)
250cc bag Fentanyl wasted with Kavin Leech RN after spiking bag and having a hole in bag. Wasted in [employer].

## 2020-07-11 NOTE — Progress Notes (Signed)
[address]       [place] [id number]             [phone]                 3 Days Post-Op Procedure(s) (LRB): EXPLORATION POST OPERATIVE OPEN HEART TAMPONADE (N/A)   Events: No events _______________________________________________________________ Vitals: BP (!) 118/94   Pulse 75   Temp (!) 101.1 F (38.4 C)   Resp 16   Ht 5\' 8"  (1.727 m)   Wt 83.1 kg   SpO2 100%   BMI 27.86 kg/m   - Neuro: sedated.  arousable  - Cardiovascular: sinus in 70s  Drips:amio 30, lasix 5, levo 13, milr 0.3  Impella P8 flow of 4.1.  No suction events PAP: (20-88)/(5-58) 23/9 CVP:  [1 mmHg-14 mmHg] 4 mmHg CO:  [4.6 L/min-6.4 L/min] 4.6 L/min CI:  [2.5 L/min/m2-3.5 L/min/m2] 2.5 L/min/m2  - Pulm:  Vent Mode: SIMV;PRVC;PSV FiO2 (%):  [50 %] 50 % Set Rate:  [16 bmp] 16 bmp Vt Set:  [650 mL] 650 mL PEEP:  [5 cmH20] 5 cmH20 Pressure Support:  [10 cmH20] 10 cmH20 Plateau Pressure:  [19 cmH20-20 cmH20] 20 cmH20  ABG    Component Value Date/Time   PHART 7.458 (H) 07/10/2020 0615   PCO2ART 44.5 07/10/2020 0615   PO2ART 130 (H) 07/10/2020 0615   HCO3 31.2 (H) 07/10/2020 0615   TCO2 32 07/10/2020 0615   ACIDBASEDEF 6.0 (H) 07/08/2020 1619   O2SAT 56.2 07/11/2020 0315    - Abd: soft - Extremity: cool.  Edema improved  .Intake/Output      10/16 0701 - 10/17 0700 10/17 0701 - 10/18 0700   I.V. (mL/kg) 2935.7 (35.3) 254.9 (3.1)   Blood     Other 268.1 23.5   NG/GT 90    IV Piggyback 716    Total Intake(mL/kg) 4009.8 (48.3) 278.4 (3.3)   Urine (mL/kg/hr) 4460 (2.2) 500 (2.3)   Emesis/NG output 700 0   Chest Tube 978 138   Total Output 6138 638   Net -2128.2 -359.7           _______________________________________________________________ Labs: CBC Latest Ref Rng & Units 07/11/2020 07/10/2020 07/10/2020  WBC 4.0 - 10.5 K/uL 12.1(H) 11.2(H) -  Hemoglobin 13.0 - 17.0 g/dL 07/12/2020) 9.7(D) 5.3(G)  Hematocrit 39 - 52 % 29.2(L) 28.3(L) 26.0(L)  Platelets 150  - 400 K/uL 57(L) 58(L) -   CMP Latest Ref Rng & Units 07/10/2020 07/10/2020 07/10/2020  Glucose 70 - 99 mg/dL 07/12/2020) - 426(S)  BUN 6 - 20 mg/dL 341(D) - 62(I)  Creatinine 0.61 - 1.24 mg/dL 29(N) - 9.89(Q)  Sodium 135 - 145 mmol/L 139 143 143  Potassium 3.5 - 5.1 mmol/L 3.9 3.8 3.9  Chloride 98 - 111 mmol/L 104 - 106  CO2 22 - 32 mmol/L 28 - 29  Calcium 8.9 - 10.3 mg/dL 7.1(L) - 7.3(L)  Total Protein 6.5 - 8.1 g/dL - - 4.7(L)  Total Bilirubin 0.3 - 1.2 mg/dL - - 3.9(H)  Alkaline Phos 38 - 126 U/L - - 39  AST 15 - 41 U/L - - 805(H)  ALT 0 - 44 U/L - - 1,338(H)    CXR:   _______________________________________________________________  Assessment and Plan: POD 4 s/p CABG, impella, POD 3 s/p re-exploration  Neuro: sedated.  Hx of drug use.  Very anxious CV: on levo, milr, and lasix.  CVP and PA diastolic in single digits.  -2L again.  No  suction events on Impella.  Continue support Pulm: will keep tubes for now.  After clot cleared, dumped good amount from mediastinal tubes  Wean vent as tolerated Renal: creat pendingContinue lasix gtt.   GI: will start trophic feeds today Heme: stable ID: afebrile Endo: insulin gtt Dispo: continue ICU care  Brynda Greathouse, MD 07/11/2020 9:37 AM

## 2020-07-11 NOTE — Care Plan (Signed)
SBP decreased to 70's with a MAP 60 and HR, NSR  70. Levo increased gradually from 13 to 18 over a period of approx 20 minutes with result of SBP to 80. Pacemaker rate increased to 86 resulting in SBP in 90's and MAP >65. After 10 minutes pacemaker rate decreased back to 60. Will continue to wean Levo. HR NSR in the 70's

## 2020-07-11 NOTE — Progress Notes (Signed)
EVENING ROUNDS NOTE :     [address]       [place] [address]             [phone]                 3 Days Post-Op Procedure(s) (LRB): EXPLORATION POST OPERATIVE OPEN HEART TAMPONADE (N/A)   Total Length of Stay:  LOS: 16 days  Events:   No events today Tolerating diuresis Weaning levo    BP 116/89   Pulse 71   Temp (!) 100.8 F (38.2 C)   Resp 16   Ht 5\' 8"  (1.727 m)   Wt 83.1 kg   SpO2 100%   BMI 27.86 kg/m   PAP: (20-88)/(5-58) 21/8 CVP:  [1 mmHg-12 mmHg] 4 mmHg CO:  [4.6 L/min-5.5 L/min] 4.8 L/min CI:  [2.5 L/min/m2-3 L/min/m2] 2.6 L/min/m2  Vent Mode: SIMV;PRVC FiO2 (%):  [50 %] 50 % Set Rate:  [16 bmp] 16 bmp Vt Set:  [650 mL] 650 mL PEEP:  [5 cmH20] 5 cmH20 Pressure Support:  [10 cmH20] 10 cmH20 Plateau Pressure:  [19 cmH20-21 cmH20] 19 cmH20  . sodium chloride 20 mL/hr at 07/08/20 1317  . sodium chloride    . sodium chloride 10 mL/hr at 07/08/20 1950  . albumin human 12.5 g (07/09/20 0237)  . amiodarone 30 mg/hr (07/11/20 1900)  . ceFEPime (MAXIPIME) IV Stopped (07/11/20 1422)  . dexmedetomidine (PRECEDEX) IV infusion 1.2 mcg/kg/hr (07/11/20 1900)  . epinephrine Stopped (07/10/20 1100)  . feeding supplement (VITAL 1.5 CAL) 20 mL/hr at 07/11/20 1900  . fentaNYL infusion INTRAVENOUS 275 mcg/hr (07/11/20 1900)  . furosemide (LASIX) infusion 5 mg/hr (07/11/20 1900)  . [START ON 07/12/2020] impella catheter heparin 25 unit/mL in dextrose 5%    . lactated ringers    . lactated ringers    . lactated ringers 20 mL/hr at 07/11/20 1900  . milrinone 0.3 mcg/kg/min (07/11/20 1900)  . nitroGLYCERIN Stopped (07/07/20 2047)  . norepinephrine (LEVOPHED) Adult infusion 10 mcg/min (07/11/20 1900)  . vancomycin Stopped (07/11/20 1733)    I/O last 3 completed shifts: In: 6041.7 [I.V.:4405.5; Other:409.4; NG/GT:260.7; IV Piggyback:966.1] Out: 07/13/20; Emesis/NG output:700; Chest Tube:1358]   CBC Latest Ref Rng & Units 07/11/2020  07/11/2020 07/10/2020  WBC 4.0 - 10.5 K/uL 12.1(H) 12.1(H) 11.2(H)  Hemoglobin 13.0 - 17.0 g/dL 07/12/2020) 4.0(N) 0.2(V)  Hematocrit 39 - 52 % 29.6(L) 29.2(L) 28.3(L)  Platelets 150 - 400 K/uL 66(L) 57(L) 58(L)    BMP Latest Ref Rng & Units 07/11/2020 07/11/2020 07/10/2020  Glucose 70 - 99 mg/dL 07/12/2020) 664(Q) 034(V)  BUN 6 - 20 mg/dL 19 20 425(Z)  Creatinine 0.61 - 1.24 mg/dL 56(L 8.75 6.43)  Sodium 135 - 145 mmol/L 135 138 139  Potassium 3.5 - 5.1 mmol/L 4.1 3.8 3.9  Chloride 98 - 111 mmol/L 98 100 104  CO2 22 - 32 mmol/L 28 29 28   Calcium 8.9 - 10.3 mg/dL 7.1(L) 7.2(L) 7.1(L)    ABG    Component Value Date/Time   PHART 7.458 (H) 07/10/2020 0615   PCO2ART 44.5 07/10/2020 0615   PO2ART 130 (H) 07/10/2020 0615   HCO3 31.2 (H) 07/10/2020 0615   TCO2 32 07/10/2020 0615   ACIDBASEDEF 6.0 (H) 07/08/2020 1619   O2SAT 56.2 07/11/2020 0315       07/10/2020, MD 07/11/2020 7:48 PM

## 2020-07-12 ENCOUNTER — Inpatient Hospital Stay (HOSPITAL_COMMUNITY): Payer: 59

## 2020-07-12 DIAGNOSIS — E440 Moderate protein-calorie malnutrition: Secondary | ICD-10-CM

## 2020-07-12 DIAGNOSIS — R570 Cardiogenic shock: Secondary | ICD-10-CM | POA: Diagnosis not present

## 2020-07-12 LAB — POCT I-STAT, CHEM 8
BUN: 19 mg/dL (ref 6–20)
Calcium, Ion: 1.11 mmol/L — ABNORMAL LOW (ref 1.15–1.40)
Chloride: 97 mmol/L — ABNORMAL LOW (ref 98–111)
Creatinine, Ser: 0.8 mg/dL (ref 0.61–1.24)
Glucose, Bld: 130 mg/dL — ABNORMAL HIGH (ref 70–99)
HCT: 31 % — ABNORMAL LOW (ref 39.0–52.0)
Hemoglobin: 10.5 g/dL — ABNORMAL LOW (ref 13.0–17.0)
Potassium: 4.6 mmol/L (ref 3.5–5.1)
Sodium: 136 mmol/L (ref 135–145)
TCO2: 27 mmol/L (ref 22–32)

## 2020-07-12 LAB — POCT I-STAT 7, (LYTES, BLD GAS, ICA,H+H)
Acid-Base Excess: 1 mmol/L (ref 0.0–2.0)
Bicarbonate: 25.8 mmol/L (ref 20.0–28.0)
Calcium, Ion: 1.13 mmol/L — ABNORMAL LOW (ref 1.15–1.40)
HCT: 28 % — ABNORMAL LOW (ref 39.0–52.0)
Hemoglobin: 9.5 g/dL — ABNORMAL LOW (ref 13.0–17.0)
O2 Saturation: 86 %
Potassium: 3.8 mmol/L (ref 3.5–5.1)
Sodium: 146 mmol/L — ABNORMAL HIGH (ref 135–145)
TCO2: 27 mmol/L (ref 22–32)
pCO2 arterial: 41.1 mmHg (ref 32.0–48.0)
pH, Arterial: 7.406 (ref 7.350–7.450)
pO2, Arterial: 51 mmHg — ABNORMAL LOW (ref 83.0–108.0)

## 2020-07-12 LAB — COOXEMETRY PANEL
Carboxyhemoglobin: 1.2 % (ref 0.5–1.5)
Methemoglobin: 0.9 % (ref 0.0–1.5)
O2 Saturation: 63.3 %
Total hemoglobin: 10.4 g/dL — ABNORMAL LOW (ref 12.0–16.0)

## 2020-07-12 LAB — COMPREHENSIVE METABOLIC PANEL
ALT: 1006 U/L — ABNORMAL HIGH (ref 0–44)
AST: 317 U/L — ABNORMAL HIGH (ref 15–41)
Albumin: 2.3 g/dL — ABNORMAL LOW (ref 3.5–5.0)
Alkaline Phosphatase: 64 U/L (ref 38–126)
Anion gap: 11 (ref 5–15)
BUN: 18 mg/dL (ref 6–20)
CO2: 28 mmol/L (ref 22–32)
Calcium: 7.5 mg/dL — ABNORMAL LOW (ref 8.9–10.3)
Chloride: 97 mmol/L — ABNORMAL LOW (ref 98–111)
Creatinine, Ser: 0.99 mg/dL (ref 0.61–1.24)
GFR, Estimated: >60 mL/min (ref >60)
Glucose, Bld: 124 mg/dL — ABNORMAL HIGH (ref 70–99)
Potassium: 3.5 mmol/L (ref 3.5–5.1)
Sodium: 136 mmol/L (ref 135–145)
Total Bilirubin: 2.8 mg/dL — ABNORMAL HIGH (ref 0.3–1.2)
Total Protein: 5.2 g/dL — ABNORMAL LOW (ref 6.5–8.1)

## 2020-07-12 LAB — CBC
HCT: 31.8 % — ABNORMAL LOW (ref 39.0–52.0)
Hemoglobin: 10.4 g/dL — ABNORMAL LOW (ref 13.0–17.0)
MCH: 29.8 pg (ref 26.0–34.0)
MCHC: 32.7 g/dL (ref 30.0–36.0)
MCV: 91.1 fL (ref 80.0–100.0)
Platelets: 84 10*3/uL — ABNORMAL LOW (ref 150–400)
RBC: 3.49 MIL/uL — ABNORMAL LOW (ref 4.22–5.81)
RDW: 13.9 % (ref 11.5–15.5)
WBC: 15.3 10*3/uL — ABNORMAL HIGH (ref 4.0–10.5)
nRBC: 0 % (ref 0.0–0.2)

## 2020-07-12 LAB — GLUCOSE, CAPILLARY
Glucose-Capillary: 112 mg/dL — ABNORMAL HIGH (ref 70–99)
Glucose-Capillary: 114 mg/dL — ABNORMAL HIGH (ref 70–99)
Glucose-Capillary: 115 mg/dL — ABNORMAL HIGH (ref 70–99)
Glucose-Capillary: 120 mg/dL — ABNORMAL HIGH (ref 70–99)
Glucose-Capillary: 121 mg/dL — ABNORMAL HIGH (ref 70–99)
Glucose-Capillary: 127 mg/dL — ABNORMAL HIGH (ref 70–99)
Glucose-Capillary: 135 mg/dL — ABNORMAL HIGH (ref 70–99)

## 2020-07-12 LAB — LACTATE DEHYDROGENASE: LDH: 377 U/L — ABNORMAL HIGH (ref 98–192)

## 2020-07-12 LAB — MAGNESIUM: Magnesium: 1.9 mg/dL (ref 1.7–2.4)

## 2020-07-12 LAB — VANCOMYCIN, TROUGH: Vancomycin Tr: 7 ug/mL — ABNORMAL LOW (ref 15–20)

## 2020-07-12 LAB — HEPARIN LEVEL (UNFRACTIONATED): Heparin Unfractionated: <0.1 IU/mL — ABNORMAL LOW (ref 0.30–0.70)

## 2020-07-12 MED ORDER — POTASSIUM CHLORIDE 10 MEQ/50ML IV SOLN
10.0000 meq | INTRAVENOUS | Status: AC
  Administered 2020-07-12 (×4): 10 meq via INTRAVENOUS
  Filled 2020-07-12 (×4): qty 50

## 2020-07-12 MED ORDER — FUROSEMIDE 10 MG/ML IJ SOLN
40.0000 mg | Freq: Two times a day (BID) | INTRAMUSCULAR | Status: DC
  Administered 2020-07-12 – 2020-07-14 (×4): 40 mg via INTRAVENOUS
  Filled 2020-07-12 (×3): qty 4

## 2020-07-12 MED ORDER — FUROSEMIDE 10 MG/ML IJ SOLN
40.0000 mg | Freq: Once | INTRAMUSCULAR | Status: DC
  Filled 2020-07-12: qty 4

## 2020-07-12 MED ORDER — VANCOMYCIN HCL 1250 MG/250ML IV SOLN
1250.0000 mg | Freq: Two times a day (BID) | INTRAVENOUS | Status: AC
  Administered 2020-07-12 – 2020-07-13 (×3): 1250 mg via INTRAVENOUS
  Filled 2020-07-12 (×3): qty 250

## 2020-07-12 MED ORDER — COLCHICINE 0.6 MG PO TABS
0.6000 mg | ORAL_TABLET | Freq: Every day | ORAL | Status: DC
  Administered 2020-07-12: 0.6 mg via ORAL
  Filled 2020-07-12: qty 1

## 2020-07-12 MED ORDER — POTASSIUM CHLORIDE 20 MEQ/15ML (10%) PO SOLN
20.0000 meq | ORAL | Status: AC
  Administered 2020-07-12 (×3): 20 meq
  Filled 2020-07-12 (×3): qty 15

## 2020-07-12 NOTE — Progress Notes (Signed)
Patient ID: Jerry [name], male   DOB: 14-Nov-1970, 49 y.o.   MRN: [id number] P    Advanced Heart Failure Rounding Note  PCP-Cardiologist: Sherren Mocha, MD    Patient Profile   49 y/o male s/p recent back surgery, admitted for acute inferior and anterolateral MI. LHC showed thrombotic occlusion of distal RCA that was treated with PTCA and thrombectomy.  He was also noted to have chronic total occlusion of the LAD with collaterals from the right (thus this territory was affected by the RCA MI) as well as 90% complex proximal LCx stenosis. EF 25-30% + apical thrombus. RV normal. Course b/c cardiogenic shock and acute hypoxic respiratory failure. ? Component of septic shock.    Subjective:    - CABG + MV repair and placement of Impella 5.5 on 10/13.  - Back to OR on 10/14 with large hematoma obstructing right atrium.   Remains on vent FiO2 40%. Awake/alert.   Hemodynamics stable on milrinone 0.3, norepinephrine 14. Impella at P-8.  Remains in NSR on IV amiodarone gtt.   Tmax 101.1, CXR with infiltrate/atelectasis at bases.  On vancomycin/cefepime empirically.   Continues to diurese on lasix gtt at 5. Weight down another 6 pounds.   Swan numbers: CVP 7 PA 24/12 CO/CI 2.7 Co-ox 63%  Impella 5.5 P8 with flow 4.2 L/min Good waveforms. Better pulsatility.  LDH 428 -> 754 -> 704 -> 377  Objective:   Weight Range: 80.3 kg Body mass index is 26.92 kg/m.   Vital Signs:   Temp:  [100.4 F (38 C)-101.3 F (38.5 C)] 100.9 F (38.3 C) (10/18 0657) Pulse Rate:  [68-89] 89 (10/18 0738) Resp:  [14-26] 18 (10/18 0738) BP: (87-136)/(68-102) 136/100 (10/18 0100) SpO2:  [99 %-100 %] 99 % (10/18 0738) Arterial Line BP: (82-112)/(59-85) 87/66 (10/18 0657) FiO2 (%):  [40 %-50 %] 40 % (10/18 0738) Weight:  [80.3 kg] 80.3 kg (10/18 0657) Last BM Date: 07/06/20  Weight change: Filed Weights   07/10/20 0600 07/11/20 0526 07/12/20 0657  Weight: 85.6 kg 83.1 kg 80.3 kg     Intake/Output:   Intake/Output Summary (Last 24 hours) at 07/12/2020 0814 Last data filed at 07/12/2020 0700 Gross per 24 hour  Intake 4090.22 ml  Output 6389 ml  Net -2298.78 ml      Physical Exam   General: NAD, on vent Neck: No JVD, no thyromegaly or thyroid nodule.  Lungs: Mildly decreased BS at bases.  CV: Nondisplaced PMI.  Heart regular S1/S2, no S3/S4, Friction rub noted.  No peripheral edema.    Abdomen: Soft, nontender, no hepatosplenomegaly, no distention.  Skin: Intact without lesions or rashes.  Neurologic: Alert and follows commands on vent Extremities: No clubbing or cyanosis.  HEENT: Normal.    Telemetry   NSR 90s Personally reviewed  Labs    CBC Recent Labs    07/11/20 1642 07/12/20 0500  WBC 12.1* 15.3*  HGB 9.4* 10.4*  HCT 29.6* 31.8*  MCV 90.5 91.1  PLT 66* 84*   Basic Metabolic Panel Recent Labs    07/11/20 1642 07/12/20 0500  NA 135 136  K 4.1 3.5  CL 98 97*  CO2 28 28  GLUCOSE 130* 124*  BUN 19 18  CREATININE 1.16 0.99  CALCIUM 7.1* 7.5*  MG 1.8 1.9   Liver Function Tests Recent Labs    07/11/20 0833 07/12/20 0500  AST 767* 317*  ALT 1,350* 1,006*  ALKPHOS 48 64  BILITOT 4.3* 2.8*  PROT 4.8* 5.2*  ALBUMIN 2.6*  2.3*   No results for input(s): LIPASE, AMYLASE in the last 72 hours. Cardiac Enzymes No results for input(s): CKTOTAL, CKMB, CKMBINDEX, TROPONINI in the last 72 hours.  BNP: BNP (last 3 results) Recent Labs    06/25/20 2334 06/26/20 0150  BNP 349.1* 368.4*    ProBNP (last 3 results) No results for input(s): PROBNP in the last 8760 hours.   D-Dimer No results for input(s): DDIMER in the last 72 hours. Hemoglobin A1C No results for input(s): HGBA1C in the last 72 hours. Fasting Lipid Panel No results for input(s): CHOL, HDL, LDLCALC, TRIG, CHOLHDL, LDLDIRECT in the last 72 hours. Thyroid Function Tests No results for input(s): TSH, T4TOTAL, T3FREE, THYROIDAB in the last 72 hours.  Invalid  input(s): FREET3  Other results:   Imaging    DG CHEST PORT 1 VIEW  Result Date: 07/12/2020 CLINICAL DATA:  Post cardiac surgery EXAM: PORTABLE CHEST 1 VIEW COMPARISON:  07/10/2020 FINDINGS: Postoperative changes in the mediastinum. Endotracheal tube, enteric tube, 2 right central venous catheters, a left PICC line, mediastinal drains, and bilateral chest tubes appear unchanged in position. Surgical clips in the left lung apex. No definite pneumothorax. Atelectasis or infiltration in the lung bases is similar to prior study. Probable small left pleural effusion. No visible pneumothorax. Heart size is normal. Surgical clips in the right axilla. IMPRESSION: Appliances appear unchanged in position. Persistent atelectasis or infiltration in the lung bases with probable small left pleural effusion. Electronically Signed   By: Lucienne Capers M.D.   On: 07/12/2020 05:31     Medications:     Scheduled Medications: . aspirin EC  325 mg Oral Daily   Or  . aspirin  324 mg Per Tube Daily  . atorvastatin  80 mg Per Tube Daily  . bisacodyl  10 mg Oral Daily   Or  . bisacodyl  10 mg Rectal Daily  . chlorhexidine gluconate (MEDLINE KIT)  15 mL Mouth Rinse BID  . Chlorhexidine Gluconate Cloth  6 each Topical Daily  . clonazePAM  0.5 mg Per Tube TID BM  . colchicine  0.6 mg Oral Daily  . docusate  200 mg Per Tube Daily  . furosemide  40 mg Intravenous Once  . insulin aspart  0-24 Units Subcutaneous Q4H  . mouth rinse  15 mL Mouth Rinse 10 times per day  . pantoprazole sodium  40 mg Per Tube Daily  . potassium chloride  20 mEq Per Tube Q4H  . sodium chloride flush  10-40 mL Intracatheter Q12H  . sodium chloride flush  3 mL Intravenous Q12H  . traZODone  100 mg Per Tube QHS    Infusions: . sodium chloride 20 mL/hr at 07/08/20 1317  . sodium chloride    . sodium chloride 10 mL/hr at 07/12/20 0700  . albumin human 12.5 g (07/09/20 0237)  . amiodarone 30 mg/hr (07/12/20 0700)  . ceFEPime  (MAXIPIME) IV Stopped (07/12/20 0550)  . dexmedetomidine (PRECEDEX) IV infusion 1 mcg/kg/hr (07/12/20 0700)  . epinephrine Stopped (07/10/20 1100)  . feeding supplement (VITAL 1.5 CAL) 20 mL/hr at 07/11/20 1900  . fentaNYL infusion INTRAVENOUS 220 mcg/hr (07/12/20 0700)  . impella catheter heparin 25 unit/mL in dextrose 5%    . lactated ringers    . lactated ringers    . lactated ringers 20 mL/hr at 07/12/20 0700  . milrinone 0.3 mcg/kg/min (07/12/20 0700)  . nitroGLYCERIN Stopped (07/07/20 2047)  . norepinephrine (LEVOPHED) Adult infusion 14 mcg/min (07/12/20 0700)  . potassium chloride    .  vancomycin Stopped (07/12/20 0658)    PRN Medications: sodium chloride, acetaminophen, albumin human, lactated ringers, LORazepam, metoprolol tartrate, midazolam, morphine injection, ondansetron (ZOFRAN) IV, oxyCODONE, sodium chloride flush, sodium chloride flush, traMADol    Assessment/Plan   1. CAD: Late presentation inferior MI.  Cath showed thrombotic occlusion distal RCA, CTO LAD with collaterals from right, and complex 90% proximal LCx stenosis.  He had PTCA/thrombectomy RCA with good flow at end of procedure.  Suspect he had damage to LAD territory as well as RCA territory as RCA collateralized the LAD.  Now s/p CABG with LIMA-LAD, left radial to OM, seq SVG-PDA/PLV - ASA/statin.  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 Suspect post-MI pericarditis. Symptoms resolved prior to surgery.  Recurrent friction rub post-surgery.  - Restart colchicine 0.6 mg daily.  3. Acute systolic CHF>>Cardiogenic Shock: Echo with EF 25-30%, RV ok (no evidence for RV infarct), LV thrombus.  Repeat bedside echo 06/27/20  EF 25%, normal RV w/ no pericardial effusion, no LV thrombus seen on this echo. Now with post-op shock, has Impella 5.5 in place.  Had to return to OR on 10/14 with large pericardial space hematoma obstructing  the right atrium, hemodynamics much better since removal.  He is on norepinephrine 14, milrinone 0.3 with co-ox 63% and CI 2.7.  Impella at P8 with good flow, no alarms, LDH low. Good waveforms. CVP 7, good diuresis so far, still above baseline weight.  - Continue to wean NE as MAP tolerates. - Decrease milrinone to 0.2.  - Stop Lasix gtt, transition to Lasix 40 mg IV bid.  - Decrease Impella to P7 today.  - Heparin gtt needs to start for Impella, atrial fibrillation, and h/o LV thrombus when ok per TCTS.  4. AF with RVR: Noted 10/12, now in NSR on amiodarone.  - Continue amio gtt while on NE and milrinone.  5. LV thrombus: Resolved on 10/3 echo.  - Restart heparin as soon as feasible as above.  6. ID: Initial concern for RLL PNA. Completed 7 days vancomycin/cefepime.  Steady fever post-op, up to 101.1 last night.  Cultures so far negative. Restart on vancomycin/cefepime post-op. ?Inflammatory response => Dresslers/pericarditis post-CABG.  7. Thrombocytopenia: Post-op. Now trending back up, 84K today.  8. S/p back surgery: Micro-discectomy just prior to admission.  Neurosurgery following, appreciated.  9. Possible benzo/narcotic withdrawal: Concern GF brought him drugs pre-op.  10. Pericardial hematoma: Obstructed RA post-CABG, had to go back to OR on 10/14.   11. Anemia: Post-op blood loss.  - Transfuse to keep hgb > 8.  12. Acute hypoxic respiratory failure.  Remains intubated post surgery.  Now awake, follows commands.  - ?Extubate today.  13. Transaminitis:  Likely shock liver, LFTs trending down.    CRITICAL CARE Performed by: Loralie Champagne  Total critical care time: 40 minutes  Critical care time was exclusive of separately billable procedures and treating other patients.  Critical care was necessary to treat or prevent imminent or life-threatening deterioration.  Critical care was time spent personally by me on the following activities: development of treatment plan with patient  and/or surrogate as well as nursing, discussions with consultants, evaluation of patient's response to treatment, examination of patient, obtaining history from patient or surrogate, ordering and performing treatments and interventions, ordering and review of laboratory studies, ordering and review of radiographic studies, pulse oximetry and re-evaluation of patient's condition.   Loralie Champagne, MD 07/12/2020  8:14 AM

## 2020-07-12 NOTE — Progress Notes (Signed)
ANTICOAGULATION CONSULT NOTE  Pharmacy Consult for Heparin Indication: Impella  No Known Allergies  Patient Measurements: Height: 5\' 8"  (172.7 cm) Weight: 80.3 kg (177 lb 0.5 oz) IBW/kg (Calculated) : 68.4 Heparin Dosing Weight: 71 kg  Vital Signs: Temp: 100.6 F (38.1 C) (10/18 1000) BP: 136/100 (10/18 0100) Pulse Rate: 87 (10/18 1000)  Labs: Recent Labs    07/10/20 0418 07/10/20 0615 07/10/20 1659 07/11/20 0332 07/11/20 0332 07/11/20 0333 07/11/20 0833 07/11/20 1642 07/12/20 0500  HGB  --    < >  --  9.4*   < >  --   --  9.4* 10.4*  HCT  --    < >  --  29.2*  --   --   --  29.6* 31.8*  PLT  --    < >  --  57*  --   --   --  66* 84*  HEPARINUNFRC <0.10*  --   --   --   --  <0.10*  --   --  <0.10*  CREATININE  --    < >   < >  --   --   --  1.21 1.16 0.99   < > = values in this interval not displayed.    Estimated Creatinine Clearance: 87.3 mL/min (by C-G formula based on SCr of 0.99 mg/dL).   Medical History: Past Medical History:  Diagnosis Date  . Anxiety   . Coronary artery disease   . Myocardial infarction [facility])     Assessment: 49 yo M post-CABG and MV repair with Impella support post-op. Patient was previously on systemic heparin pre-op for LV thrombus on ECHO 10/2 which was resolved on repeat ECHO on 10/4.   Heparinized purge (1/2 strength) infusing only, no systemic heparin started yet per CVTS. Heparin level remains undetectable, purge volume stable, CBC stable.  Goal of Therapy:  Not titrating to goal at this time Monitor platelets by anticoagulation protocol: Yes   Plan:  Continue heparin 25 unit/ml purge solution  Follow-up restarting systemic IV heparin  Monitor HL, CBC, s/sx bleeding  12/4, PharmD, BCPS Clinical Pharmacist [phone] Please check AMION for all [facility] numbers 07/12/2020

## 2020-07-12 NOTE — Progress Notes (Signed)
4 Days Post-Op Procedure(s) (LRB): EXPLORATION POST OPERATIVE OPEN HEART TAMPONADE (N/A) Subjective: Intubated, awake and responsive  Objective: Vital signs in last 24 hours: Temp:  [100.4 F (38 C)-101.3 F (38.5 C)] 100.9 F (38.3 C) (10/18 0657) Pulse Rate:  [68-89] 89 (10/18 0738) Cardiac Rhythm: Normal sinus rhythm (10/17 2000) Resp:  [14-26] 18 (10/18 0738) BP: (87-136)/(68-102) 136/100 (10/18 0100) SpO2:  [99 %-100 %] 99 % (10/18 0738) Arterial Line BP: (82-112)/(59-85) 87/66 (10/18 0657) FiO2 (%):  [40 %-50 %] 40 % (10/18 0738) Weight:  [80.3 kg] 80.3 kg (10/18 0657)  Hemodynamic parameters for last 24 hours: PAP: (20-34)/(7-15) 21/9 CVP:  [1 mmHg-12 mmHg] 3 mmHg CO:  [4.6 L/min-5.1 L/min] 5 L/min CI:  [2.5 L/min/m2-2.8 L/min/m2] 2.7 L/min/m2  Intake/Output from previous day: 10/17 0701 - 10/18 0700 In: 4234.3 [I.V.:2942.6; NG/GT:410.7; IV Piggyback:599.9] Out: 7517 [GYFVC:9449; Chest Tube:580] Intake/Output this shift: No intake/output data recorded.  General appearance: alert and cooperative Neurologic: limited exam but nonfocal Heart: regular rate and rhythm and Loud rub Lungs: clear to auscultation bilaterally Abdomen: normal findings: soft, non-tender Wound: clean and dry  Lab Results: Recent Labs    07/11/20 1642 07/12/20 0500  WBC 12.1* 15.3*  HGB 9.4* 10.4*  HCT 29.6* 31.8*  PLT 66* 84*   BMET:  Recent Labs    07/11/20 1642 07/12/20 0500  NA 135 136  K 4.1 3.5  CL 98 97*  CO2 28 28  GLUCOSE 130* 124*  BUN 19 18  CREATININE 1.16 0.99  CALCIUM 7.1* 7.5*    PT/INR: No results for input(s): LABPROT, INR in the last 72 hours. ABG    Component Value Date/Time   PHART 7.458 (H) 07/10/2020 0615   HCO3 31.2 (H) 07/10/2020 0615   TCO2 32 07/10/2020 0615   ACIDBASEDEF 6.0 (H) 07/08/2020 1619   O2SAT 63.3 07/12/2020 0500   CBG (last 3)  Recent Labs    07/12/20 0009 07/12/20 0508 07/12/20 0758  GLUCAP 114* 112* 135*     Assessment/Plan: S/P Procedure(s) (LRB): EXPLORATION POST OPERATIVE OPEN HEART TAMPONADE (N/A) -Remains critically ill   CV- in SR on amiodarone  Milrinone 0.3, norepi @14  currently, impella at p8, CI 2.7, co-ox 63  Will try decreasing milrinone slightly, impella to p7. Wean levophed as tolerated RESP- VDRF. Good oxygenation on 40%, persistent atelectasis/ consolidation at bases  Will try to wean vent today RENAL- AKI resolved with creatinine 0.99, diuresing well  Supplement K  Agree with plan to stop Lasix drip and give intermittently ENDO- CBG well controlled GI/Nutrition- on trickle OG feeds, if unable to extubate will need to consider postpyloric tube NEURO- relatively calm on current regimen ID- persistent fevers. On Vanco and Maxipime for presumed pneumonia  May be a component of pericarditis- has a loud rub- on colchicine for that  LOS: 17 days    June 07/12/2020

## 2020-07-12 NOTE — Progress Notes (Signed)
Pharmacy Antibiotic Note  Jerry [name] is a 49 y.o. 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 Pharmacy has been consulted for vancomycin and cefepime dosing with fevers. Pt previously received perioperative vancomycin (last dose 10/14) and cefuroxime.   SCr continues to improve, vancomycin trough subtherapeutic at 7 mcg/ml. Pt remains febrile, cultures unrevealing.  Plan: Continue Cefepime to 2g IV Q8 hrs Increase vancomycin to 1250mg  q12h Follow Cr, LOT, Cx   Height: 5\' 8"  (172.7 cm) Weight: 80.3 kg (177 lb 0.5 oz) IBW/kg (Calculated) : 68.4  Temp (24hrs), Avg:100.9 F (38.3 C), Min:100.4 F (38 C), Max:101.3 F (38.5 C)  Recent Labs  Lab 07/08/20 1057 07/08/20 1131 07/08/20 1731 07/09/20 0206 07/10/20 0417 07/10/20 0418 07/10/20 1659 07/11/20 0332 07/11/20 0833 07/11/20 1642 07/12/20 0500  WBC  --    < > 14.9*   < > 10.0  --  11.2* 12.1*  --  12.1* 15.3*  CREATININE  --    < > 1.85*   < > 1.40*  --  1.36*  --  1.21 1.16 0.99  LATICACIDVEN 8.0*  --  3.8*  --   --  1.4  --   --   --   --   --   VANCOTROUGH  --   --   --   --   --   --   --   --   --   --  7*   < > = values in this interval not displayed.    Estimated Creatinine Clearance: 87.3 mL/min (by C-G formula based on SCr of 0.99 mg/dL).    No Known Allergies    07/13/20, PharmD, BCPS Clinical Pharmacist [phone] Please check AMION for all [facility] numbers 07/12/2020

## 2020-07-12 NOTE — Progress Notes (Signed)
Patient ID: Jerry [name], male   DOB: May 18, 1971, 49 y.o.   MRN: [id number] EVENING ROUNDS NOTE :     [address]       [place] [address]             [phone]                 4 Days Post-Op Procedure(s) (LRB): EXPLORATION POST OPERATIVE OPEN HEART TAMPONADE (N/A)  Total Length of Stay:  LOS: 17 days  BP (!) 136/100   Pulse 82   Temp 100 F (37.8 C)   Resp 13   Ht 5\' 8"  (1.727 m)   Wt 80.3 kg   SpO2 100%   BMI 26.92 kg/m   .Intake/Output      10/17 0701 - 10/18 0700 10/18 0701 - 10/19 0700   I.V. (mL/kg) 2942.6 (36.6) 1019.4 (12.7)   Other 281.1 116.5   NG/GT 410.7 430   IV Piggyback 599.9 299.9   Total Intake(mL/kg) 4234.3 (52.7) 1865.9 (23.2)   Urine (mL/kg/hr) 6145 (3.2) 1400 (1.6)   Emesis/NG output 0    Chest Tube 580 160   Total Output 6725 1560   Net -2490.7 +305.9          . sodium chloride 20 mL/hr at 07/12/20 1700  . sodium chloride    . sodium chloride 10 mL/hr at 07/12/20 1700  . albumin human 60 mL/hr at 07/12/20 1700  . amiodarone 30 mg/hr (07/12/20 1700)  . ceFEPime (MAXIPIME) IV Stopped (07/12/20 1416)  . dexmedetomidine (PRECEDEX) IV infusion 1 mcg/kg/hr (07/12/20 1700)  . epinephrine Stopped (07/10/20 1100)  . feeding supplement (VITAL 1.5 CAL) 20 mL/hr at 07/12/20 1700  . fentaNYL infusion INTRAVENOUS 175 mcg/hr (07/12/20 1700)  . impella catheter heparin 25 unit/mL in dextrose 5%    . lactated ringers    . lactated ringers    . lactated ringers 20 mL/hr at 07/12/20 1700  . milrinone 0.2 mcg/kg/min (07/12/20 1700)  . nitroGLYCERIN Stopped (07/07/20 2047)  . norepinephrine (LEVOPHED) Adult infusion 11 mcg/min (07/12/20 1700)  . vancomycin 1,250 mg (07/12/20 1708)     Lab Results  Component Value Date   WBC 15.3 (H) 07/12/2020   HGB 10.5 (L) 07/12/2020   HCT 31.0 (L) 07/12/2020   PLT 84 (L) 07/12/2020   GLUCOSE 130 (H) 07/12/2020   CHOL 185 06/25/2020   TRIG 165 (H) 06/25/2020   HDL 31 (L) 06/25/2020   LDLCALC  121 (H) 06/25/2020   ALT 1,006 (H) 07/12/2020   AST 317 (H) 07/12/2020   NA 136 07/12/2020   K 4.6 07/12/2020   CL 97 (L) 07/12/2020   CREATININE 0.80 07/12/2020   BUN 19 07/12/2020   CO2 28 07/12/2020   TSH 1.488 06/26/2020   INR 2.3 (H) 07/09/2020   HGBA1C 5.4 07/07/2020   Remains with impells in p7  Flow 3.8 l/m reains on vent cr stable      07/09/2020 MD  Beeper [phone] Office [phone] 07/12/2020 5:49 PM

## 2020-07-12 NOTE — Progress Notes (Addendum)
Nutrition Follow-up / Consult  DOCUMENTATION CODES:   Non-severe (moderate) malnutrition in context of acute illness/injury  INTERVENTION:   Tube Feeding:  Vital 1.5 at 20 ml/h via OG tube.   Recommend increase by 10 ml every 4 hours to goal rate of 55 ml/h (1320 ml per day) with Prosource TF 45 ml TID to provide 2100 kcal, 122 gm protein, 1008 ml free water daily.   NUTRITION DIAGNOSIS:   Moderate Malnutrition related to acute illness (recent back surgery) as evidenced by mild muscle depletion, moderate muscle depletion, mild fat depletion, moderate fat depletion.  Ongoing   GOAL:   Patient will meet greater than or equal to 90% of their needs  Progressing   MONITOR:   PO intake, Supplement acceptance  REASON FOR ASSESSMENT:   Ventilator, Consult Enteral/tube feeding initiation and management  ASSESSMENT:   49 yo male admitted with STEMI with LV thrombus. S/P aspiration thrombectomy and balloon angioplasty 10/1. No significant PMH.  10/13 S/P CABG, MV repair, and placement of Impella. 10/14 returned to OR for large hematoma obstructing R atrium.  Received MD Consult for TF initiation and management. Trickle TF initiated 10/17 due to pressor requirement. Patient has been tolerating trickle TF well. Currently receiving Vital 1.5 at 20 ml/h via OG tube.  Hopeful for vent weaning and extubation today. Continuing trickle TF today. If unable to extubate, will need to increase TF rate to meet nutrition needs.   Patient remains intubated on ventilator support. MV: 10.8 L/min Temp (24hrs), Avg:100.9 F (38.3 C), Min:100.4 F (38 C), Max:101.3 F (38.5 C) MAP range >/= 68 this morning   Labs reviewed.  CBG: [id number]  Medications reviewed and include Levophed, Colace, Lasix, Novolog, potassium chloride. IVF: LR at 20 ml/h, NS at 10 ml/h  I/O +2.8 L since admission UOP 6,145 ml x 24 hours   Diet Order:   Diet Order            Diet NPO time specified  Diet  effective now                 EDUCATION NEEDS:   Not appropriate for education at this time  Skin:  Skin Assessment: Skin Integrity Issues: Skin Integrity Issues:: Incisions Incisions: open vertebral column from recent back surgery, left arm, right leg, chest  Last BM:  07/06/20  Height:   Ht Readings from Last 1 Encounters:  07/07/20 5\' 8"  (1.727 m)    Weight:   Wt Readings from Last 1 Encounters:  07/12/20 80.3 kg   Admission weight 80.7 kg  Ideal Body Weight:  70 kg  BMI:  Body mass index is 26.92 kg/m.  Estimated Nutritional Needs:   Kcal:  2132  Protein:  110-130 grams  Fluid:  >/= 2 L    2133, RD, LDN, CNSC Please refer to Amion for contact information.

## 2020-07-13 ENCOUNTER — Inpatient Hospital Stay (HOSPITAL_COMMUNITY): Payer: 59

## 2020-07-13 DIAGNOSIS — R570 Cardiogenic shock: Secondary | ICD-10-CM | POA: Diagnosis not present

## 2020-07-13 LAB — GLUCOSE, CAPILLARY
Glucose-Capillary: 119 mg/dL — ABNORMAL HIGH (ref 70–99)
Glucose-Capillary: 127 mg/dL — ABNORMAL HIGH (ref 70–99)
Glucose-Capillary: 112 mg/dL — ABNORMAL HIGH (ref 70–99)
Glucose-Capillary: 113 mg/dL — ABNORMAL HIGH (ref 70–99)
Glucose-Capillary: 109 mg/dL — ABNORMAL HIGH (ref 70–99)
Glucose-Capillary: 109 mg/dL — ABNORMAL HIGH (ref 70–99)

## 2020-07-13 LAB — COMPREHENSIVE METABOLIC PANEL
ALT: 580 U/L — ABNORMAL HIGH (ref 0–44)
AST: 116 U/L — ABNORMAL HIGH (ref 15–41)
Albumin: 2.3 g/dL — ABNORMAL LOW (ref 3.5–5.0)
Alkaline Phosphatase: 65 U/L (ref 38–126)
Anion gap: 5 (ref 5–15)
BUN: 17 mg/dL (ref 6–20)
CO2: 31 mmol/L (ref 22–32)
Calcium: 7.8 mg/dL — ABNORMAL LOW (ref 8.9–10.3)
Chloride: 102 mmol/L (ref 98–111)
Creatinine, Ser: 0.89 mg/dL (ref 0.61–1.24)
GFR, Estimated: >60 mL/min (ref >60)
Glucose, Bld: 121 mg/dL — ABNORMAL HIGH (ref 70–99)
Potassium: 4 mmol/L (ref 3.5–5.1)
Sodium: 138 mmol/L (ref 135–145)
Total Bilirubin: 2.1 mg/dL — ABNORMAL HIGH (ref 0.3–1.2)
Total Protein: 5.1 g/dL — ABNORMAL LOW (ref 6.5–8.1)

## 2020-07-13 LAB — COOXEMETRY PANEL
Carboxyhemoglobin: 1.3 % (ref 0.5–1.5)
Methemoglobin: 0.9 % (ref 0.0–1.5)
O2 Saturation: 69.2 %
Total hemoglobin: 10 g/dL — ABNORMAL LOW (ref 12.0–16.0)

## 2020-07-13 LAB — POCT I-STAT 7, (LYTES, BLD GAS, ICA,H+H)
Acid-Base Excess: 9 mmol/L — ABNORMAL HIGH (ref 0.0–2.0)
Acid-Base Excess: 7 mmol/L — ABNORMAL HIGH (ref 0.0–2.0)
Bicarbonate: 33.7 mmol/L — ABNORMAL HIGH (ref 20.0–28.0)
Bicarbonate: 31.6 mmol/L — ABNORMAL HIGH (ref 20.0–28.0)
Calcium, Ion: 1.13 mmol/L — ABNORMAL LOW (ref 1.15–1.40)
Calcium, Ion: 1.11 mmol/L — ABNORMAL LOW (ref 1.15–1.40)
HCT: 32 % — ABNORMAL LOW (ref 39.0–52.0)
HCT: 31 % — ABNORMAL LOW (ref 39.0–52.0)
Hemoglobin: 10.9 g/dL — ABNORMAL LOW (ref 13.0–17.0)
Hemoglobin: 10.5 g/dL — ABNORMAL LOW (ref 13.0–17.0)
O2 Saturation: 98 %
O2 Saturation: 96 %
Patient temperature: 37.5
Patient temperature: 37.4
Potassium: 4.4 mmol/L (ref 3.5–5.1)
Potassium: 4.2 mmol/L (ref 3.5–5.1)
Sodium: 138 mmol/L (ref 135–145)
Sodium: 138 mmol/L (ref 135–145)
TCO2: 35 mmol/L — ABNORMAL HIGH (ref 22–32)
TCO2: 33 mmol/L — ABNORMAL HIGH (ref 22–32)
pCO2 arterial: 45.9 mmHg (ref 32.0–48.0)
pCO2 arterial: 42.7 mmHg (ref 32.0–48.0)
pH, Arterial: 7.476 — ABNORMAL HIGH (ref 7.350–7.450)
pH, Arterial: 7.478 — ABNORMAL HIGH (ref 7.350–7.450)
pO2, Arterial: 97 mmHg (ref 83.0–108.0)
pO2, Arterial: 82 mmHg — ABNORMAL LOW (ref 83.0–108.0)

## 2020-07-13 LAB — CBC
HCT: 30.8 % — ABNORMAL LOW (ref 39.0–52.0)
Hemoglobin: 9.5 g/dL — ABNORMAL LOW (ref 13.0–17.0)
MCH: 28.3 pg (ref 26.0–34.0)
MCHC: 30.8 g/dL (ref 30.0–36.0)
MCV: 91.7 fL (ref 80.0–100.0)
Platelets: 92 10*3/uL — ABNORMAL LOW (ref 150–400)
RBC: 3.36 MIL/uL — ABNORMAL LOW (ref 4.22–5.81)
RDW: 13.8 % (ref 11.5–15.5)
WBC: 13.5 10*3/uL — ABNORMAL HIGH (ref 4.0–10.5)
nRBC: 0 % (ref 0.0–0.2)

## 2020-07-13 LAB — CULTURE, RESPIRATORY W GRAM STAIN

## 2020-07-13 LAB — LACTATE DEHYDROGENASE: LDH: 307 U/L — ABNORMAL HIGH (ref 98–192)

## 2020-07-13 LAB — HEPARIN LEVEL (UNFRACTIONATED)
Heparin Unfractionated: <0.1 IU/mL — ABNORMAL LOW (ref 0.30–0.70)
Heparin Unfractionated: <0.1 IU/mL — ABNORMAL LOW (ref 0.30–0.70)
Heparin Unfractionated: <0.1 IU/mL — ABNORMAL LOW (ref 0.30–0.70)

## 2020-07-13 MED ORDER — ORAL CARE MOUTH RINSE
15.0000 mL | Freq: Two times a day (BID) | OROMUCOSAL | Status: DC
  Administered 2020-07-13 – 2020-07-20 (×8): 15 mL via OROMUCOSAL

## 2020-07-13 MED ORDER — SODIUM CHLORIDE 0.9 % IV SOLN
2.0000 g | Freq: Three times a day (TID) | INTRAVENOUS | Status: DC
  Administered 2020-07-13 – 2020-07-17 (×12): 2 g via INTRAVENOUS
  Filled 2020-07-13 (×16): qty 2

## 2020-07-13 MED ORDER — POTASSIUM CHLORIDE 20 MEQ/15ML (10%) PO SOLN
40.0000 meq | Freq: Once | ORAL | Status: AC
  Administered 2020-07-13: 40 meq via ORAL
  Filled 2020-07-13: qty 30

## 2020-07-13 MED ORDER — DIGOXIN 125 MCG PO TABS
0.1250 mg | ORAL_TABLET | Freq: Every day | ORAL | Status: DC
  Administered 2020-07-13 – 2020-07-15 (×3): 0.125 mg
  Filled 2020-07-13 (×3): qty 1

## 2020-07-13 MED ORDER — HEPARIN (PORCINE) 25000 UT/250ML-% IV SOLN
1700.0000 [IU]/h | INTRAVENOUS | Status: DC
  Administered 2020-07-13: 100 [IU]/h via INTRAVENOUS
  Administered 2020-07-15: 800 [IU]/h via INTRAVENOUS
  Administered 2020-07-16: 1400 [IU]/h via INTRAVENOUS
  Administered 2020-07-17: 1700 [IU]/h via INTRAVENOUS
  Filled 2020-07-13 (×4): qty 250

## 2020-07-13 MED ORDER — HEPARIN SODIUM (PORCINE) 5000 UNIT/ML IJ SOLN
50000.0000 [IU] | INTRAVENOUS | Status: DC
  Administered 2020-07-13: 50000 [IU]
  Filled 2020-07-13 (×2): qty 10

## 2020-07-13 MED ORDER — CHLORHEXIDINE GLUCONATE 0.12 % MT SOLN
15.0000 mL | Freq: Two times a day (BID) | OROMUCOSAL | Status: DC
  Administered 2020-07-14 – 2020-07-21 (×10): 15 mL via OROMUCOSAL
  Filled 2020-07-13 (×10): qty 15

## 2020-07-13 MED ORDER — SODIUM CHLORIDE 0.9 % IV SOLN
INTRAVENOUS | Status: DC
  Administered 2020-07-13: 1000 mL via INTRAVENOUS
  Administered 2020-07-16: 250 mL via INTRAVENOUS

## 2020-07-13 MED ORDER — DIGOXIN 125 MCG PO TABS: 0.1250 mg | ORAL_TABLET | Freq: Every day | ORAL | Status: DC

## 2020-07-13 MED ORDER — COLCHICINE 0.6 MG PO TABS
0.6000 mg | ORAL_TABLET | Freq: Every day | ORAL | Status: DC
  Administered 2020-07-13 – 2020-07-17 (×5): 0.6 mg
  Filled 2020-07-13 (×5): qty 1

## 2020-07-13 NOTE — Progress Notes (Signed)
CT surgery p.m. Rounds  Patient extubated today, breathing comfortably but complaining of pain Peripheral heparin started for imPella coverage Impella speed reduced to P5 level today with stable blood pressure, LVAD flow 2.3 L/min  Blood pressure 107/79, pulse 98, temperature (!) 100.4 F (38 C), resp. rate (!) 22, height 5\' 8"  (1.727 m), weight 78.2 kg, SpO2 97 %.

## 2020-07-13 NOTE — Progress Notes (Signed)
ANTICOAGULATION CONSULT NOTE  Pharmacy Consult for Heparin Indication: Impella  No Known Allergies  Patient Measurements: Height: 5\' 8"  (172.7 cm) Weight: 78.2 kg (172 lb 6.4 oz) IBW/kg (Calculated) : 68.4 Heparin Dosing Weight: 71 kg  Vital Signs: Temp: 100 F (37.8 C) (10/19 1600) BP: 131/86 (10/19 1600) Pulse Rate: 102 (10/19 1600)  Labs: Recent Labs    07/11/20 0333 07/11/20 1642 07/12/20 0500 07/12/20 0500 07/12/20 1627 07/12/20 1627 07/13/20 0304 07/13/20 0304 07/13/20 1054 07/13/20 1306 07/13/20 1554  HGB   < > 9.4* 10.4*   < > 10.5*   < > 9.5*   < > 10.9* 10.5*  --   HCT   < > 29.6* 31.8*   < > 31.0*   < > 30.8*  --  32.0* 31.0*  --   PLT  --  66* 84*  --   --   --  92*  --   --   --   --   HEPARINUNFRC   < >  --  <0.10*  --   --   --  <0.10*  --   --   --  <0.10*  CREATININE   < > 1.16 0.99  --  0.80  --  0.89  --   --   --   --    < > = values in this interval not displayed.    Estimated Creatinine Clearance: 97.1 mL/min (by C-G formula based on SCr of 0.89 mg/dL).   Medical History: Past Medical History:  Diagnosis Date  . Anxiety   . Coronary artery disease   . Myocardial infarction [facility])     Assessment: 49 yo M post-CABG and MV repair with Impella support post-op. Patient was previously on systemic heparin pre-op for LV thrombus on ECHO 10/2 which was resolved on repeat ECHO on 10/4.   Heparinized purge now full strength, systemic heparin at 100 units/hr for a total of 650 units/hr of heparin infusing. Heparin level remains undetectable, purge volume stable 11s. Pltc improving, CT output slowing down. Will continue to slowly titrate up systemic heparin.   Goal of Therapy:  Heparin level 0.2-0.5 units/ml Monitor platelets by anticoagulation protocol: Yes   Plan:  -Increase to 300 units of peripheral heparin -Check heparin level q6h x24h    12/4 PharmD., BCPS Clinical Pharmacist 07/13/2020 4:45 PM

## 2020-07-13 NOTE — Plan of Care (Signed)
  Problem: Education: Goal: Understanding of cardiac disease, CV risk reduction, and recovery process will improve Outcome: Progressing Goal: Understanding of medication regimen will improve Outcome: Progressing Goal: Individualized Educational Video(s) Outcome: Progressing   Problem: Activity: Goal: Ability to tolerate increased activity will improve Outcome: Progressing   Problem: Cardiac: Goal: Ability to achieve and maintain adequate cardiopulmonary perfusion will improve Outcome: Progressing Goal: Vascular access site(s) Level 0-1 will be maintained Outcome: Progressing   Problem: Health Behavior/Discharge Planning: Goal: Ability to safely manage health-related needs after discharge will improve Outcome: Progressing   Problem: Education: Goal: Knowledge of General Education information will improve Description: Including pain rating scale, medication(s)/side effects and non-pharmacologic comfort measures Outcome: Progressing   Problem: Health Behavior/Discharge Planning: Goal: Ability to manage health-related needs will improve Outcome: Progressing   Problem: Clinical Measurements: Goal: Ability to maintain clinical measurements within normal limits will improve Outcome: Progressing Goal: Will remain free from infection Outcome: Progressing Goal: Diagnostic test results will improve Outcome: Progressing Goal: Respiratory complications will improve Outcome: Progressing Goal: Cardiovascular complication will be avoided Outcome: Progressing   Problem: Activity: Goal: Risk for activity intolerance will decrease Outcome: Progressing   Problem: Nutrition: Goal: Adequate nutrition will be maintained Outcome: Progressing   Problem: Coping: Goal: Level of anxiety will decrease Outcome: Progressing   Problem: Elimination: Goal: Will not experience complications related to bowel motility Outcome: Progressing Goal: Will not experience complications related to  urinary retention Outcome: Progressing   Problem: Pain Managment: Goal: General experience of comfort will improve Outcome: Progressing   Problem: Safety: Goal: Ability to remain free from injury will improve Outcome: Progressing   Problem: Skin Integrity: Goal: Risk for impaired skin integrity will decrease Outcome: Progressing   

## 2020-07-13 NOTE — Progress Notes (Signed)
5 Days Post-Op Procedure(s) (LRB): EXPLORATION POST OPERATIVE OPEN HEART TAMPONADE (N/A) Subjective: Intubated, but alert and calm  Objective: Vital signs in last 24 hours: Temp:  [99.5 F (37.5 C)-100.6 F (38.1 C)] 99.9 F (37.7 C) (10/19 0645) Pulse Rate:  [81-98] 90 (10/19 0738) Cardiac Rhythm: Normal sinus rhythm (10/19 0400) Resp:  [10-32] 26 (10/19 0738) BP: (87-121)/(54-98) 116/80 (10/19 0700) SpO2:  [96 %-100 %] 100 % (10/19 0738) Arterial Line BP: (80-116)/(52-79) 103/73 (10/19 0700) FiO2 (%):  [40 %] 40 % (10/19 0738) Weight:  [78.2 kg] 78.2 kg (10/19 0500)  Hemodynamic parameters for last 24 hours: PAP: (20-49)/(5-20) 28/10 CVP:  [0 mmHg-20 mmHg] 2 mmHg CO:  [4.3 L/min-5.9 L/min] 5.9 L/min CI:  [2.4 L/min/m2-3.2 L/min/m2] 3.2 L/min/m2  Intake/Output from previous day: 10/18 0701 - 10/19 0700 In: 4327.7 [I.V.:2428.8; NG/GT:710; IV Piggyback:908.1] Out: 5060 [Urine:4760; Chest Tube:300] Intake/Output this shift: No intake/output data recorded.  General appearance: alert, cooperative and no distress Neurologic: intact Heart: regular rate and rhythm and rub less prominent Lungs: clear to auscultation bilaterally Abdomen: normal findings: soft, non-tender Wound: clean and dry  Lab Results: Recent Labs    07/12/20 0500 07/12/20 0500 07/12/20 1627 07/13/20 0304  WBC 15.3*  --   --  13.5*  HGB 10.4*   < > 10.5* 9.5*  HCT 31.8*   < > 31.0* 30.8*  PLT 84*  --   --  92*   < > = values in this interval not displayed.   BMET:  Recent Labs    07/12/20 0500 07/12/20 0500 07/12/20 1627 07/13/20 0304  NA 136   < > 136 138  K 3.5   < > 4.6 4.0  CL 97*   < > 97* 102  CO2 28  --   --  31  GLUCOSE 124*   < > 130* 121*  BUN 18   < > 19 17  CREATININE 0.99   < > 0.80 0.89  CALCIUM 7.5*  --   --  7.8*   < > = values in this interval not displayed.    PT/INR: No results for input(s): LABPROT, INR in the last 72 hours. ABG    Component Value Date/Time    PHART 7.458 (H) 07/10/2020 0615   HCO3 31.2 (H) 07/10/2020 0615   TCO2 27 07/12/2020 1627   ACIDBASEDEF 6.0 (H) 07/08/2020 1619   O2SAT 69.2 07/13/2020 0304   CBG (last 3)  Recent Labs    07/12/20 2217 07/12/20 2312 07/13/20 0311  GLUCAP 115* 121* 119*    Assessment/Plan: S/P Procedure(s) (LRB): EXPLORATION POST OPERATIVE OPEN HEART TAMPONADE (N/A) -CV- in SR  Good hemodynamics on p7, milrinone 0.2 levophed 14  Decreased to p6 with no significant hemodynamic change  Wean levophed as BP tolerates RESP_ VDRF- weaned on PS yesterday, back on PS this AM  Wean as tolerated RENAL- creatinine and lytes OK ENDO_ CBG well controlled Thrombocytopenia- slowly improving ID- day 4 vanco and cefepime. Fevers and WBC trending down Nutrition- continue TF   LOS: 18 days    Loreli Slot 07/13/2020

## 2020-07-13 NOTE — Progress Notes (Deleted)
See  MD progress note.

## 2020-07-13 NOTE — Procedures (Signed)
Extubation Procedure Note  Patient Details:   Name: Jerry [name] DOB: 07/12/1971 MRN: [id number]   Airway Documentation:    Vent end date: 07/13/20 Vent end time: 1156   Evaluation  O2 sats: stable throughout Complications: No apparent complications Patient did tolerate procedure well. Bilateral Breath Sounds: Diminished, Clear   Yes  Patient extubated per MD order. NIF -24, VC 2.2. Positive cuff leak. No stridor noted. Vitals are stable on 3L [place]. RN at bedside.   H  07/13/2020, 12:08 PM

## 2020-07-13 NOTE — Progress Notes (Signed)
Pharmacy Antibiotic Note  Jerry [name] is a 49 y.o. 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 Pharmacy has been consulted for vancomycin and cefepime dosing with fevers. Pt previously received perioperative vancomycin (last dose 10/14) and cefuroxime.   Respiratory culture now growing Burkholderia cepacia. Per ID RPh, this is usually a contaminant, but is intrinsically resistant to cefepime though ceftazidime should cover.   Plan: Vancomycin x5 day course - will stop today Ceftazidime 2g IV q8h   Height: 5\' 8"  (172.7 cm) Weight: 78.2 kg (172 lb 6.4 oz) IBW/kg (Calculated) : 68.4  Temp (24hrs), Avg:100.1 F (37.8 C), Min:99.5 F (37.5 C), Max:100.6 F (38.1 C)  Recent Labs  Lab 07/08/20 1057 07/08/20 1131 07/08/20 1731 07/09/20 0206 07/10/20 0417 07/10/20 0418 07/10/20 1659 07/10/20 1659 07/11/20 0332 07/11/20 0833 07/11/20 1642 07/12/20 0500 07/12/20 1627 07/13/20 0304  WBC  --    < > 14.9*   < >   < >  --  11.2*  --  12.1*  --  12.1* 15.3*  --  13.5*  CREATININE  --    < > 1.85*   < >   < >  --  1.36*   < >  --  1.21 1.16 0.99 0.80 0.89  LATICACIDVEN 8.0*  --  3.8*  --   --  1.4  --   --   --   --   --   --   --   --   VANCOTROUGH  --   --   --   --   --   --   --   --   --   --   --  7*  --   --    < > = values in this interval not displayed.    Estimated Creatinine Clearance: 97.1 mL/min (by C-G formula based on SCr of 0.89 mg/dL).    No Known Allergies    07/15/20, PharmD, BCPS Clinical Pharmacist [phone] Please check AMION for all [facility] Pharmacy numbers 07/13/2020

## 2020-07-13 NOTE — Progress Notes (Signed)
Patient ID: Jerry [name], male   DOB: October 06, 1970, 49 y.o.   MRN: [id number] P    Advanced Heart Failure Rounding Note  PCP-Cardiologist: Sherren Mocha, MD    Patient Profile   49 y/o male s/p recent back surgery, admitted for acute inferior and anterolateral MI. LHC showed thrombotic occlusion of distal RCA that was treated with PTCA and thrombectomy.  He was also noted to have chronic total occlusion of the LAD with collaterals from the right (thus this territory was affected by the RCA MI) as well as 90% complex proximal LCx stenosis. EF 25-30% + apical thrombus. RV normal. Course b/c cardiogenic shock and acute hypoxic respiratory failure. ? Component of septic shock.    Subjective:    - CABG + MV repair and placement of Impella 5.5 on 10/13.  - Back to OR on 10/14 with large hematoma obstructing right atrium.   Awake/alert on vent, on PSV trial.   Hemodynamics stable on milrinone 0.2, norepinephrine 10. Impella at P-6.  Remains in NSR on IV amiodarone gtt.   Tmax 100.6.  On vancomycin/cefepime empirically, trach aspirate now growing Burkholderia cepacia. WBCs trending down.    Continues to diurese on lasix 40 mg IV bid, weight down 5 lbs.    Platelets improving, up to 92K.   Swan numbers: CVP 6 PA 36/11 CO/CI 3.2 Co-ox 69%  Impella 5.5 P6 with flow 3.5 L/min Good waveforms. Better pulsatility.  LDH 428 -> 754 -> 704 -> 377 -> 307  Objective:   Weight Range: 78.2 kg Body mass index is 26.21 kg/m.   Vital Signs:   Temp:  [99.5 F (37.5 C)-100.6 F (38.1 C)] 99.9 F (37.7 C) (10/19 0800) Pulse Rate:  [81-98] 90 (10/19 0800) Resp:  [10-32] 24 (10/19 0800) BP: (87-121)/(54-98) 121/88 (10/19 0800) SpO2:  [96 %-100 %] 99 % (10/19 0800) Arterial Line BP: (80-116)/(52-79) 94/65 (10/19 0800) FiO2 (%):  [40 %] 40 % (10/19 0738) Weight:  [78.2 kg] 78.2 kg (10/19 0500) Last BM Date: 07/06/20  Weight change: Filed Weights   07/11/20 0526 07/12/20 0657 07/13/20 0500   Weight: 83.1 kg 80.3 kg 78.2 kg    Intake/Output:   Intake/Output Summary (Last 24 hours) at 07/13/2020 0830 Last data filed at 07/13/2020 0800 Gross per 24 hour  Intake 4324.43 ml  Output 4985 ml  Net -660.57 ml      Physical Exam   General: NAD, on vent.  Neck: No JVD, no thyromegaly or thyroid nodule.  Lungs: Decreased at bases. CV: Nondisplaced PMI.  Heart regular S1/S2, no S3/S4, do not hear friction rub today.  No peripheral edema.   Abdomen: Soft, nontender, no hepatosplenomegaly, no distention.  Skin: Intact without lesions or rashes.  Neurologic: Alert and oriented x 3.  Psych: Normal affect. Extremities: No clubbing or cyanosis.  HEENT: Normal.    Telemetry   NSR 90s Personally reviewed  Labs    CBC Recent Labs    07/12/20 0500 07/12/20 0500 07/12/20 1627 07/13/20 0304  WBC 15.3*  --   --  13.5*  HGB 10.4*   < > 10.5* 9.5*  HCT 31.8*   < > 31.0* 30.8*  MCV 91.1  --   --  91.7  PLT 84*  --   --  92*   < > = values in this interval not displayed.   Basic Metabolic Panel Recent Labs    07/11/20 1642 07/11/20 1642 07/12/20 0500 07/12/20 0500 07/12/20 1627 07/13/20 0304  NA 135   < >  136   < > 136 138  K 4.1   < > 3.5   < > 4.6 4.0  CL 98   < > 97*   < > 97* 102  CO2 28   < > 28  --   --  31  GLUCOSE 130*   < > 124*   < > 130* 121*  BUN 19   < > 18   < > 19 17  CREATININE 1.16   < > 0.99   < > 0.80 0.89  CALCIUM 7.1*   < > 7.5*  --   --  7.8*  MG 1.8  --  1.9  --   --   --    < > = values in this interval not displayed.   Liver Function Tests Recent Labs    07/12/20 0500 07/13/20 0304  AST 317* 116*  ALT 1,006* 580*  ALKPHOS 64 65  BILITOT 2.8* 2.1*  PROT 5.2* 5.1*  ALBUMIN 2.3* 2.3*   No results for input(s): LIPASE, AMYLASE in the last 72 hours. Cardiac Enzymes No results for input(s): CKTOTAL, CKMB, CKMBINDEX, TROPONINI in the last 72 hours.  BNP: BNP (last 3 results) Recent Labs    06/25/20 2334 06/26/20 0150  BNP  349.1* 368.4*    ProBNP (last 3 results) No results for input(s): PROBNP in the last 8760 hours.   D-Dimer No results for input(s): DDIMER in the last 72 hours. Hemoglobin A1C No results for input(s): HGBA1C in the last 72 hours. Fasting Lipid Panel No results for input(s): CHOL, HDL, LDLCALC, TRIG, CHOLHDL, LDLDIRECT in the last 72 hours. Thyroid Function Tests No results for input(s): TSH, T4TOTAL, T3FREE, THYROIDAB in the last 72 hours.  Invalid input(s): FREET3  Other results:   Imaging    DG Chest Port 1 View  Result Date: 07/13/2020 CLINICAL DATA:  Atelectasis EXAM: PORTABLE CHEST 1 VIEW COMPARISON:  07/12/2020 FINDINGS: Postoperative changes in the mediastinum, right clavicle, right axilla and left lung apex. Enteric and endotracheal tubes, 2 right and 1 left central venous catheters, and bilateral chest drains are unchanged in position. Mild cardiac enlargement. Persistent bilateral perihilar and basilar infiltration with small effusions, likely edema. IMPRESSION: No significant interval change. Electronically Signed   By: Lucienne Capers M.D.   On: 07/13/2020 06:07     Medications:     Scheduled Medications: . aspirin EC  325 mg Oral Daily   Or  . aspirin  324 mg Per Tube Daily  . atorvastatin  80 mg Per Tube Daily  . bisacodyl  10 mg Oral Daily   Or  . bisacodyl  10 mg Rectal Daily  . chlorhexidine gluconate (MEDLINE KIT)  15 mL Mouth Rinse BID  . Chlorhexidine Gluconate Cloth  6 each Topical Daily  . clonazePAM  0.5 mg Per Tube TID BM  . colchicine  0.6 mg Per Tube Daily  . digoxin  0.125 mg Per Tube Daily  . docusate  200 mg Per Tube Daily  . furosemide  40 mg Intravenous BID  . insulin aspart  0-24 Units Subcutaneous Q4H  . mouth rinse  15 mL Mouth Rinse 10 times per day  . pantoprazole sodium  40 mg Per Tube Daily  . potassium chloride  40 mEq Oral Once  . sodium chloride flush  10-40 mL Intracatheter Q12H  . sodium chloride flush  3 mL Intravenous  Q12H  . traZODone  100 mg Per Tube QHS    Infusions: . sodium  chloride 20 mL/hr at 07/12/20 1700  . sodium chloride    . sodium chloride 10 mL/hr at 07/13/20 0800  . albumin human 12.5 g (07/12/20 2148)  . amiodarone 30 mg/hr (07/13/20 0800)  . ceFEPime (MAXIPIME) IV Stopped (07/13/20 0734)  . dexmedetomidine (PRECEDEX) IV infusion 1 mcg/kg/hr (07/13/20 0800)  . epinephrine Stopped (07/12/20 2358)  . feeding supplement (VITAL 1.5 CAL) 20 mL/hr at 07/12/20 1900  . fentaNYL infusion INTRAVENOUS 175 mcg/hr (07/13/20 0800)  . impella catheter heparin 25 unit/mL in dextrose 5%    . lactated ringers    . lactated ringers    . lactated ringers 20 mL/hr at 07/13/20 0800  . milrinone 0.2 mcg/kg/min (07/13/20 0800)  . nitroGLYCERIN Stopped (07/07/20 2047)  . norepinephrine (LEVOPHED) Adult infusion 14 mcg/min (07/13/20 0800)  . vancomycin Stopped (07/13/20 1610)    PRN Medications: sodium chloride, acetaminophen, albumin human, lactated ringers, LORazepam, metoprolol tartrate, midazolam, morphine injection, ondansetron (ZOFRAN) IV, oxyCODONE, sodium chloride flush, sodium chloride flush, traMADol    Assessment/Plan   1. CAD: Late presentation inferior MI.  Cath showed thrombotic occlusion distal RCA, CTO LAD with collaterals from right, and complex 90% proximal LCx stenosis.  He had PTCA/thrombectomy RCA with good flow at end of procedure.  Suspect he had damage to LAD territory as well as RCA territory as RCA collateralized the LAD.  Now s/p CABG with LIMA-LAD, left radial to OM, seq SVG-PDA/PLV - ASA/statin.  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 Suspect post-MI pericarditis. Symptoms resolved prior to surgery.  Recurrent friction rub post-surgery.  - Restarted colchicine 0.6 mg daily.  3. Acute systolic CHF>>Cardiogenic Shock: Echo with EF 25-30%, RV ok (no evidence for RV infarct), LV thrombus.   Repeat bedside echo 06/27/20  EF 25%, normal RV w/ no pericardial effusion, no LV thrombus seen on this echo. Now with post-op shock, has Impella 5.5 in place.  Had to return to OR on 10/14 with large pericardial space hematoma obstructing the right atrium, hemodynamics much better since removal.  He is on norepinephrine 10, milrinone 0.2 with co-ox 69% and CI 3.2.  Impella at P7 with good flow, no alarms, LDH low. Good waveforms. CVP 5, good diuresis so far, still above baseline weight.  - Continue to wean NE as MAP tolerates, hopefully extubation and weaning sedation will help with this. - Continue milrinone 0.2, would leave here until Impella out.  - Continue Lasix 40 mg IV bid.  - Decrease Impella to P6 today. Will recheck this afternoon, if coming down on NE will drop to P5.  - Heparin gtt needs to start for Impella, atrial fibrillation, and h/o LV thrombus => ok to start today.   4. AF with RVR: Noted 10/12, now in NSR on amiodarone.  - Continue amio gtt while on NE and milrinone.  - Starting heparin gtt.  5. LV thrombus: Resolved on 10/3 echo.  - Starting heparin gtt.  6. ID: Initial concern for RLL PNA. Completed 7 days vancomycin/cefepime.  Steady fever post-op though curve trending down. WBCs down.  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  - Continue vancomycin to complete 5 days.  - Stop cefepime and start ceftazidime for Burkholderia.  7. Thrombocytopenia: Post-op. Now trending back up, 92K today.  8. S/p back surgery: Micro-discectomy just prior to admission.  Neurosurgery has seen.   9. Possible benzo/narcotic withdrawal: Concern GF brought him drugs pre-op.  10. Pericardial hematoma: Obstructed RA post-CABG, had to go back to OR on 10/14.   11. Anemia: Post-op blood loss.  - Transfuse to keep hgb > 8.  12. Acute hypoxic respiratory failure.  Remains intubated post surgery.  Now awake, follows commands and on PSV.  - Aim to extubate today.  13. Transaminitis:   Likely shock liver, LFTs trending down.    CRITICAL CARE Performed by: Loralie Champagne  Total critical care time: 40 minutes  Critical care time was exclusive of separately billable procedures and treating other patients.  Critical care was necessary to treat or prevent imminent or life-threatening deterioration.  Critical care was time spent personally by me on the following activities: development of treatment plan with patient and/or surrogate as well as nursing, discussions with consultants, evaluation of patient's response to treatment, examination of patient, obtaining history from patient or surrogate, ordering and performing treatments and interventions, ordering and review of laboratory studies, ordering and review of radiographic studies, pulse oximetry and re-evaluation of patient's condition.   Loralie Champagne, MD 07/13/2020 8:30 AM

## 2020-07-13 NOTE — Progress Notes (Addendum)
ANTICOAGULATION CONSULT NOTE  Pharmacy Consult for Heparin Indication: Impella  No Known Allergies  Patient Measurements: Height: 5\' 8"  (172.7 cm) Weight: 78.2 kg (172 lb 6.4 oz) IBW/kg (Calculated) : 68.4 Heparin Dosing Weight: 71 kg  Vital Signs: Temp: 99.9 F (37.7 C) (10/19 0645) BP: 116/80 (10/19 0700) Pulse Rate: 90 (10/19 0738)  Labs: Recent Labs    07/11/20 0333 07/11/20 0833 07/11/20 1642 07/11/20 1642 07/12/20 0500 07/12/20 0500 07/12/20 1627 07/13/20 0304  HGB  --   --  9.4*   < > 10.4*   < > 10.5* 9.5*  HCT  --   --  29.6*   < > 31.8*  --  31.0* 30.8*  PLT  --   --  66*  --  84*  --   --  92*  HEPARINUNFRC <0.10*  --   --   --  <0.10*  --   --  <0.10*  CREATININE  --    < > 1.16   < > 0.99  --  0.80 0.89   < > = values in this interval not displayed.    Estimated Creatinine Clearance: 97.1 mL/min (by C-G formula based on SCr of 0.89 mg/dL).   Medical History: Past Medical History:  Diagnosis Date  . Anxiety   . Coronary artery disease   . Myocardial infarction [facility])     Assessment: 49 yo M post-CABG and MV repair with Impella support post-op. Patient was previously on systemic heparin pre-op for LV thrombus on ECHO 10/2 which was resolved on repeat ECHO on 10/4.   Heparinized purge (1/2 strength) infusing only, no systemic heparin started yet per CVTS. Heparin level remains undetectable, purge volume stable. Pltc improving, CT output slowing down. Now ok to begin systemic heparin per 12/4.  Goal of Therapy:  Heparin level 0.2-0.5 units/ml Monitor platelets by anticoagulation protocol: Yes   Plan:  -Adjust heparin purge to normal concentration (50 units/ml) -Start 100 units of peripheral heparin -Check heparin level q6h x24h    Shirlee Latch, PharmD, BCPS Clinical Pharmacist [phone] Please check AMION for all [facility] Pharmacy numbers 07/13/2020

## 2020-07-14 DIAGNOSIS — R570 Cardiogenic shock: Secondary | ICD-10-CM | POA: Diagnosis not present

## 2020-07-14 LAB — COMPREHENSIVE METABOLIC PANEL
ALT: 368 U/L — ABNORMAL HIGH (ref 0–44)
AST: 66 U/L — ABNORMAL HIGH (ref 15–41)
Albumin: 2.3 g/dL — ABNORMAL LOW (ref 3.5–5.0)
Alkaline Phosphatase: 67 U/L (ref 38–126)
Anion gap: 7 (ref 5–15)
BUN: 15 mg/dL (ref 6–20)
CO2: 28 mmol/L (ref 22–32)
Calcium: 8 mg/dL — ABNORMAL LOW (ref 8.9–10.3)
Chloride: 102 mmol/L (ref 98–111)
Creatinine, Ser: 0.93 mg/dL (ref 0.61–1.24)
GFR, Estimated: >60 mL/min (ref >60)
Glucose, Bld: 114 mg/dL — ABNORMAL HIGH (ref 70–99)
Potassium: 4 mmol/L (ref 3.5–5.1)
Sodium: 137 mmol/L (ref 135–145)
Total Bilirubin: 1.8 mg/dL — ABNORMAL HIGH (ref 0.3–1.2)
Total Protein: 5.2 g/dL — ABNORMAL LOW (ref 6.5–8.1)

## 2020-07-14 LAB — CBC
HCT: 32.4 % — ABNORMAL LOW (ref 39.0–52.0)
Hemoglobin: 10.4 g/dL — ABNORMAL LOW (ref 13.0–17.0)
MCH: 29.6 pg (ref 26.0–34.0)
MCHC: 32.1 g/dL (ref 30.0–36.0)
MCV: 92.3 fL (ref 80.0–100.0)
Platelets: 164 10*3/uL (ref 150–400)
RBC: 3.51 MIL/uL — ABNORMAL LOW (ref 4.22–5.81)
RDW: 13.6 % (ref 11.5–15.5)
WBC: 16.2 10*3/uL — ABNORMAL HIGH (ref 4.0–10.5)
nRBC: 0 % (ref 0.0–0.2)

## 2020-07-14 LAB — GLUCOSE, CAPILLARY
Glucose-Capillary: 101 mg/dL — ABNORMAL HIGH (ref 70–99)
Glucose-Capillary: 103 mg/dL — ABNORMAL HIGH (ref 70–99)
Glucose-Capillary: 104 mg/dL — ABNORMAL HIGH (ref 70–99)
Glucose-Capillary: 105 mg/dL — ABNORMAL HIGH (ref 70–99)
Glucose-Capillary: 106 mg/dL — ABNORMAL HIGH (ref 70–99)
Glucose-Capillary: 96 mg/dL (ref 70–99)

## 2020-07-14 LAB — CULTURE, BLOOD (ROUTINE X 2): Culture: NO GROWTH

## 2020-07-14 LAB — COOXEMETRY PANEL
Carboxyhemoglobin: 1.5 % (ref 0.5–1.5)
Methemoglobin: 0.9 % (ref 0.0–1.5)
O2 Saturation: 54.5 %
Total hemoglobin: 10.6 g/dL — ABNORMAL LOW (ref 12.0–16.0)

## 2020-07-14 LAB — LACTATE DEHYDROGENASE: LDH: 300 U/L — ABNORMAL HIGH (ref 98–192)

## 2020-07-14 LAB — HEPARIN LEVEL (UNFRACTIONATED)
Heparin Unfractionated: 0.13 IU/mL — ABNORMAL LOW (ref 0.30–0.70)
Heparin Unfractionated: <0.1 IU/mL — ABNORMAL LOW (ref 0.30–0.70)
Heparin Unfractionated: <0.1 IU/mL — ABNORMAL LOW (ref 0.30–0.70)

## 2020-07-14 LAB — MAGNESIUM: Magnesium: 2.1 mg/dL (ref 1.7–2.4)

## 2020-07-14 MED ORDER — SODIUM CHLORIDE 0.9 % IV BOLUS
250.0000 mL | Freq: Once | INTRAVENOUS | Status: AC
  Administered 2020-07-14: 250 mL via INTRAVENOUS

## 2020-07-14 MED ORDER — CLONAZEPAM 0.5 MG PO TBDP
0.5000 mg | ORAL_TABLET | Freq: Three times a day (TID) | ORAL | Status: DC
  Administered 2020-07-14 – 2020-07-15 (×5): 0.5 mg via ORAL
  Filled 2020-07-14 (×5): qty 1

## 2020-07-14 MED ORDER — SODIUM CHLORIDE 0.9 % IV SOLN: INTRAVENOUS | Status: AC

## 2020-07-14 MED ORDER — ALPRAZOLAM 0.5 MG PO TABS
0.5000 mg | ORAL_TABLET | Freq: Every evening | ORAL | Status: DC | PRN
  Administered 2020-07-15 – 2020-07-20 (×7): 0.5 mg via ORAL
  Filled 2020-07-14 (×7): qty 1

## 2020-07-14 MED ORDER — PANTOPRAZOLE SODIUM 40 MG IV SOLR
40.0000 mg | INTRAVENOUS | Status: DC
  Administered 2020-07-14: 40 mg via INTRAVENOUS
  Filled 2020-07-14: qty 40

## 2020-07-14 MED ORDER — CLONAZEPAM 0.1 MG/ML ORAL SUSPENSION
0.5000 mg | Freq: Three times a day (TID) | ORAL | Status: DC
  Filled 2020-07-14 (×2): qty 5

## 2020-07-14 MED ORDER — OXYCODONE HCL 5 MG PO TABS
5.0000 mg | ORAL_TABLET | ORAL | Status: DC | PRN
  Administered 2020-07-15: 10 mg via ORAL
  Filled 2020-07-14: qty 2

## 2020-07-14 NOTE — Progress Notes (Signed)
ANTICOAGULATION CONSULT NOTE - Follow Up Consult  Pharmacy Consult for heparin Indication: Impella  Labs: Recent Labs    07/12/20 0500 07/12/20 0500 07/12/20 1627 07/13/20 0304 07/13/20 0304 07/13/20 1054 07/13/20 1054 07/13/20 1306 07/13/20 1554 07/14/20 0400 07/14/20 1200 07/14/20 2004  HGB 10.4*   < > 10.5* 9.5*   < > 10.9*   < > 10.5*  --  10.4*  --   --   HCT 31.8*   < > 31.0* 30.8*   < > 32.0*  --  31.0*  --  32.4*  --   --   PLT 84*  --   --  92*  --   --   --   --   --  164  --   --   HEPARINUNFRC <0.10*   < >  --  <0.10*  --   --   --   --    < > 0.13* <0.10* <0.10*  CREATININE 0.99   < > 0.80 0.89  --   --   --   --   --  0.93  --   --    < > = values in this interval not displayed.    Assessment: 49yo male subtherapeutic heparin level < 0.1 on heparin systemic drip rate 800 units/hr and Purge running at 10.1 ml/hr (~500 units/hr), total heparin drip 1300 uts/hr, prior to surgery patient required 1900 uts/hr to have therapeutic heparin level of > 0.3.   H/h stable, no overt bleeding noted, CT output stable.   Goal of Therapy:  Heparin level 0.2-0.5 units/ml   Plan:  Will not increase heparin drip tonight Daily HL, CBC   Sheppard Coil PharmD., BCPS Clinical Pharmacist 07/14/2020 10:07 PM

## 2020-07-14 NOTE — Progress Notes (Signed)
TCTS BRIEF SICU PROGRESS NOTE  6 Days Post-Op  S/P Procedure(s) (LRB): EXPLORATION POST OPERATIVE OPEN HEART TAMPONADE (N/A)   Stable day NSR w/ stable hemodynamics and Impella flows on P3, PA pressures low Excellent diuresis earlier today  Plan: Continue current plan  Purcell Nails, MD 07/14/2020 5:35 PM

## 2020-07-14 NOTE — Evaluation (Signed)
Clinical/Bedside Swallow Evaluation Patient Details  Name: Jerry [name] MRN: [id number] Date of Birth: 1970-10-11  Today's Date: 07/14/2020 Time: SLP Start Time (ACUTE ONLY): 7494 SLP Stop Time (ACUTE ONLY): 0857 SLP Time Calculation (min) (ACUTE ONLY): 20 min  Past Medical History:  Past Medical History:  Diagnosis Date   Anxiety    Coronary artery disease    Myocardial infarction [facility])    Past Surgical History:  Past Surgical History:  Procedure Laterality Date   BACK SURGERY     CARDIAC CATHETERIZATION     CORONARY ANGIOPLASTY     CORONARY ARTERY BYPASS GRAFT N/A 07/07/2020   Procedure: CORONARY ARTERY BYPASS GRAFTING (CABG) x FOUR, USING LEFT RADIAL ARTERY AND RIGHT LEG GREATER SAPHENOUS VEIN HARVESTED ENDOSCOPICALLY;  Surgeon: Loreli Slot, MD;  Location: [facility] OR;  Service: Open Heart Surgery;  Laterality: N/A;   CORONARY BALLOON ANGIOPLASTY N/A 06/25/2020   Procedure: CORONARY BALLOON ANGIOPLASTY;  Surgeon: Tonny Bollman, MD;  Location: [facility] INVASIVE CV LAB;  Service: Cardiovascular;  Laterality: N/A;   CORONARY THROMBECTOMY N/A 06/25/2020   Procedure: Coronary Thrombectomy;  Surgeon: Tonny Bollman, MD;  Location: [facility] INVASIVE CV LAB;  Service: Cardiovascular;  Laterality: N/A;   CORONARY/GRAFT ACUTE MI REVASCULARIZATION N/A 06/26/2020   Procedure: Coronary/Graft Acute MI Revascularization;  Surgeon: Tonny Bollman, MD;  Location: [facility] INVASIVE CV LAB;  Service: Cardiovascular;  Laterality: N/A;   ENDOVEIN HARVEST OF GREATER SAPHENOUS VEIN Right 07/07/2020   Procedure: ENDOVEIN HARVEST OF GREATER SAPHENOUS VEIN;  Surgeon: Loreli Slot, MD;  Location: [facility] OR;  Service: Open Heart Surgery;  Laterality: Right;   EXPLORATION POST OPERATIVE OPEN HEART N/A 07/08/2020   Procedure: EXPLORATION POST OPERATIVE OPEN HEART TAMPONADE;  Surgeon: Loreli Slot, MD;  Location: [facility] OR;  Service: Open Heart Surgery;  Laterality: N/A;   FRACTURE SURGERY     LEFT HEART CATH  AND CORONARY ANGIOGRAPHY N/A 06/25/2020   Procedure: LEFT HEART CATH AND CORONARY ANGIOGRAPHY;  Surgeon: Tonny Bollman, MD;  Location: [facility] INVASIVE CV LAB;  Service: Cardiovascular;  Laterality: N/A;   PLACEMENT OF IMPELLA LEFT VENTRICULAR ASSIST DEVICE Right 07/07/2020   Procedure: PLACEMENT OF IMPELLA LEFT VENTRICULAR ASSIST DEVICE USING ABIOMED IMPELLA 5.5;  Surgeon: Loreli Slot, MD;  Location: [facility] OR;  Service: Open Heart Surgery;  Laterality: Right;   RADIAL ARTERY HARVEST Left 07/07/2020   Procedure: RADIAL ARTERY HARVEST left;  Surgeon: Loreli Slot, MD;  Location: [facility] OR;  Service: Open Heart Surgery;  Laterality: Left;   right clavicle fracture reapir  2010   RIGHT HEART CATH N/A 06/26/2020   Procedure: RIGHT HEART CATH;  Surgeon: Tonny Bollman, MD;  Location: [facility] INVASIVE CV LAB;  Service: Cardiovascular;  Laterality: N/A;   TEE WITHOUT CARDIOVERSION N/A 07/07/2020   Procedure: TRANSESOPHAGEAL ECHOCARDIOGRAM (TEE);  Surgeon: Loreli Slot, MD;  Location: [facility] OR;  Service: Open Heart Surgery;  Laterality: N/A;   HPI:  49 y/o male s/p recent back surgery, admitted 06/25/20 with chest pain found to have acute inferior STEMI. Also found to have acute distal RCA occlusion. Presented for CABG on 07/07/20 with diagnosis of CAD. Pt intubated 06/27/20-07/13/20.   Assessment / Plan / Recommendation Clinical Impression  Pt seen for BSE and was alert but slightly lethargic. With ice chips, pt demonstrated 2 instances of an immediate throat clear and cough. No s/sx of aspiration were observed with thin liquid or puree. He reported feeling nauseated after about 3 bites of puree. Pt demonstrates fatigue during feeding, particularly when  instructed to consume 3 oz of thin liquid. He consumed about 2 oz consecutively before becoming fatigued. Pt demonstrates dysphonic voice and weak cough, indicating risk for aspiration. Given these factors, SLP will wait to order diet, but pt can  have water and floor stock puree as neeed. Give whole meds in puree.  SLP Visit Diagnosis: Dysphagia, unspecified (R13.10)    Aspiration Risk  Moderate aspiration risk    Diet Recommendation Other (Comment);NPO (Water and floor stock puree as needed)   Liquid Administration via: Straw;Cup Medication Administration: Whole meds with puree Supervision: Staff to assist with self feeding Compensations: Slow rate;Small sips/bites Postural Changes: Seated upright at 90 degrees    Other  Recommendations Oral Care Recommendations: Oral care QID   Follow up Recommendations        Frequency and Duration min 2x/week  2 weeks       Prognosis Prognosis for Safe Diet Advancement: Good Barriers to Reach Goals: Motivation      Swallow Study   General HPI: 49 y/o male s/p recent back surgery, admitted 06/25/20 with chest pain found to have acute inferior STEMI. Also found to have acute distal RCA occlusion. Presented for CABG on 07/07/20 with diagnosis of CAD. Pt intubated 06/27/20-07/13/20. Type of Study: Bedside Swallow Evaluation Diet Prior to this Study: NPO Respiratory Status: Nasal cannula History of Recent Intubation: Yes Length of Intubations (days): 16 days (3 intubations over the 16 days) Date extubated: 07/13/20 Behavior/Cognition: Alert;Cooperative;Pleasant mood;Lethargic/Drowsy Oral Cavity Assessment: Within Functional Limits Oral Care Completed by SLP: No Oral Cavity - Dentition: Adequate natural dentition Vision: Functional for self-feeding Self-Feeding Abilities: Needs assist Patient Positioning: Upright in bed Baseline Vocal Quality: Low vocal intensity;Hoarse    Oral/Motor/Sensory Function Overall Oral Motor/Sensory Function: Within functional limits   Ice Chips Ice chips: Impaired Presentation: Spoon Pharyngeal Phase Impairments: Throat Clearing - Immediate;Cough - Immediate   Thin Liquid Thin Liquid: Within functional limits Presentation: Straw;Cup    Nectar Thick  Nectar Thick Liquid: Not tested   Honey Thick Honey Thick Liquid: Not tested   Puree Puree: Within functional limits   Solid     Solid: Not tested      Royetta Crochet 07/14/2020,9:08 AM

## 2020-07-14 NOTE — Progress Notes (Signed)
ANTICOAGULATION CONSULT NOTE - Follow Up Consult  Pharmacy Consult for heparin Indication: Impella  Labs: Recent Labs    07/12/20 0500 07/12/20 0500 07/12/20 1627 07/13/20 0304 07/13/20 0304 07/13/20 1054 07/13/20 1054 07/13/20 1306 07/13/20 1554 07/13/20 2317 07/14/20 0400 07/14/20 1200  HGB 10.4*   < > 10.5* 9.5*   < > 10.9*   < > 10.5*  --   --  10.4*  --   HCT 31.8*   < > 31.0* 30.8*   < > 32.0*  --  31.0*  --   --  32.4*  --   PLT 84*  --   --  92*  --   --   --   --   --   --  164  --   HEPARINUNFRC <0.10*   < >  --  <0.10*  --   --   --   --    < > <0.10* 0.13* <0.10*  CREATININE 0.99   < > 0.80 0.89  --   --   --   --   --   --  0.93  --    < > = values in this interval not displayed.    Assessment: 49yo male subtherapeutic heparin level < 0.1 on heparin systemic drip rate 600 uts/hr and Purge running at 11.2 ml/hr (~550 units/hr), total heparin drip 1150 uts/hr, prior to surgery patient required 1900 uts/hr to have therapeutic heparin level of > 0.3.   H/h stable, no overt bleeding noted, CT output stable   Goal of Therapy:  Heparin level 0.2-0.5 units/ml   Plan:  Will increase heparin drip to 800 units/hr  Draw HL in 6hr to ensure didn't increase too much  Daily HL, CBC    Leota Sauers Pharm.D. CPP, BCPS Clinical Pharmacist [phone] 07/14/2020 3:27 PM

## 2020-07-14 NOTE — Progress Notes (Signed)
Nutrition Follow-up  DOCUMENTATION CODES:   Non-severe (moderate) malnutrition in context of acute illness/injury  INTERVENTION:   - Once diet advanced, Ensure Enlive po TID, each supplement provides 350 kcal and 20 grams of protein  NUTRITION DIAGNOSIS:   Moderate Malnutrition related to acute illness (recent back surgery) as evidenced by mild muscle depletion, moderate muscle depletion, mild fat depletion, moderate fat depletion.  Ongoing  GOAL:   Patient will meet greater than or equal to 90% of their needs  Unmet  MONITOR:   PO intake, Supplement acceptance, Diet advancement, Labs, Weight trends, Skin, I & O's  REASON FOR ASSESSMENT:   Ventilator, Consult Enteral/tube feeding initiation and management  ASSESSMENT:   49 yo male admitted with STEMI with LV thrombus. S/P aspiration thrombectomy and balloon angioplasty 10/1. No significant PMH.  10/06 - extubated 10/13 - s/p CABG x 4, MV repair, impella 10/14 - return to OR for large hematoma obstructing R atrium 10/17 - trickle tube feeds initiated 10/19 - extubated  Discussed pt with RN and during ICU rounds. SLP saw pt this morning and will wait until tomorrow to start PO diet due to fatigue. However, pt able to have water and floor stock purees as needed. Noted plan for possible removal of impella on Thursday or Friday.  Current weight: 77.2 kg Admit weight: 76.3 kg  Per RN edema assessment, pt with non-pitting edema to BUE and BLE. Pt also with +2 pitting perineal edema.  Medications reviewed and include: dulcolax, colace, SSI q 4 hours, protonix, IV abx, amiodarone, precedex, heparin, LR @ 20 ml/hr, NS @ 10 ml/hr, milrinone, levophed  Labs reviewed: elevated LFTs CBG's: 113-106 x 24 hours  UOP: 5815 ml x 24 hours CT: 280 ml x 24 hours I/O's: -2.6 L since admit  Diet Order:   Diet Order            Diet NPO time specified Except for: Ice Chips, Sips with Meds, Other (See Comments)  Diet effective now                  EDUCATION NEEDS:   Not appropriate for education at this time  Skin:  Skin Assessment: Skin Integrity Issues: Incisions: open vertebral column from recent back surgery, left arm, right leg, chest  Last BM:  07/06/20  Height:   Ht Readings from Last 1 Encounters:  07/07/20 5\' 8"  (1.727 m)    Weight:   Wt Readings from Last 1 Encounters:  07/14/20 77.2 kg    Ideal Body Weight:  70 kg  BMI:  Body mass index is 25.88 kg/m.  Estimated Nutritional Needs:   Kcal:  2200-2400  Protein:  110-130 grams  Fluid:  >/= 2 L    07/16/20, MS, RD, LDN Inpatient Clinical Dietitian Please see AMiON for contact information.

## 2020-07-14 NOTE — Progress Notes (Addendum)
Patient ID: Jerry [name], male   DOB: 1971-06-15, 49 y.o.   MRN: [id number] P    Advanced Heart Failure Rounding Note  PCP-Cardiologist: Tonny Bollman, MD    Patient Profile   49 y/o male s/p recent back surgery, admitted for acute inferior and anterolateral MI. LHC showed thrombotic occlusion of distal RCA that was treated with PTCA and thrombectomy.  He was also noted to have chronic total occlusion of the LAD with collaterals from the right (thus this territory was affected by the RCA MI) as well as 90% complex proximal LCx stenosis. EF 25-30% + apical thrombus. RV normal. Course b/c cardiogenic shock and acute hypoxic respiratory failure. ? Component of septic shock.    Subjective:    - CABG + MV repair and placement of Impella 5.5 on 10/13.  - Back to OR on 10/14 with large hematoma obstructing right atrium.  - Extubated 10/19   Continues on Impella P-5 + Milrinone 0.2 + NE 5.  Co-ox down, 69>>55% today.  Good diuresis yesterday -5.8L in UOP. Wt down 2 lb. CVP 6-7.   Continues w/ low grade fevers, Tmax 100.8. WBC trending back up, 15>>13>>16K. Trach aspirate now growing Burkholderia cepacia. Abx switched to [place].   No dyspnea. Feels tired.   Swan numbers: CVP 5-6 PA 20/8 CI 2.7  Impella 5.5 P5 with flow 2.8 L/min Good waveforms. Better pulsatility.  LDH 428 -> 754 -> 704 -> 377 -> 307->300   Objective:   Weight Range: 77.2 kg Body mass index is 25.88 kg/m.   Vital Signs:   Temp:  [98.6 F (37 C)-100.8 F (38.2 C)] 98.6 F (37 C) (10/20 0400) Pulse Rate:  [77-112] 86 (10/20 0700) Resp:  [12-28] 17 (10/20 0500) BP: (99-133)/(58-97) 101/84 (10/20 0700) SpO2:  [92 %-100 %] 98 % (10/20 0700) Arterial Line BP: (81-119)/(50-72) 100/65 (10/20 0700) FiO2 (%):  [40 %] 40 % (10/19 0738) Weight:  [77.2 kg] 77.2 kg (10/20 0600) Last BM Date: 07/06/20  Weight change: Filed Weights   07/12/20 0657 07/13/20 0500 07/14/20 0600  Weight: 80.3 kg 78.2 kg 77.2 kg     Intake/Output:   Intake/Output Summary (Last 24 hours) at 07/14/2020 0737 Last data filed at 07/14/2020 0700 Gross per 24 hour  Intake 3012.48 ml  Output 6095 ml  Net -3082.52 ml      Physical Exam   CVP 6  General:  extubated, fatigue appearing, sitting up in bed No respiratory difficulty HEENT: normal Neck: supple. no JVD. Carotids 2+ bilat; no bruits. No lymphadenopathy or thyromegaly appreciated. Cor: PMI nondisplaced. Regular rate & rhythm. No rubs, gallops or murmurs. sternotomy site stable  Lungs: clear Abdomen: soft, nontender, nondistended. No hepatosplenomegaly. No bruits or masses. Good bowel sounds. Extremities: no cyanosis, clubbing, rash, edema Neuro: alert & oriented x 3, cranial nerves grossly intact. moves all 4 extremities w/o difficulty. Affect pleasant.  Telemetry   NSR/ sinus tach 90s-low 100s Personally reviewed  Labs    CBC Recent Labs    07/13/20 0304 07/13/20 1054 07/13/20 1306 07/14/20 0400  WBC 13.5*  --   --  16.2*  HGB 9.5*   < > 10.5* 10.4*  HCT 30.8*   < > 31.0* 32.4*  MCV 91.7  --   --  92.3  PLT 92*  --   --  164   < > = values in this interval not displayed.   Basic Metabolic Panel Recent Labs    32/00/37 0500 07/12/20 1627 07/13/20 0304 07/13/20 1054  07/13/20 1306 07/14/20 0400  NA 136   < > 138   < > 138 137  K 3.5   < > 4.0   < > 4.2 4.0  CL 97*   < > 102  --   --  102  CO2 28  --  31  --   --  28  GLUCOSE 124*   < > 121*  --   --  114*  BUN 18   < > 17  --   --  15  CREATININE 0.99   < > 0.89  --   --  0.93  CALCIUM 7.5*  --  7.8*  --   --  8.0*  MG 1.9  --   --   --   --  2.1   < > = values in this interval not displayed.   Liver Function Tests Recent Labs    07/13/20 0304 07/14/20 0400  AST 116* 66*  ALT 580* 368*  ALKPHOS 65 67  BILITOT 2.1* 1.8*  PROT 5.1* 5.2*  ALBUMIN 2.3* 2.3*   No results for input(s): LIPASE, AMYLASE in the last 72 hours. Cardiac Enzymes No results for input(s): CKTOTAL,  CKMB, CKMBINDEX, TROPONINI in the last 72 hours.  BNP: BNP (last 3 results) Recent Labs    06/25/20 2334 06/26/20 0150  BNP 349.1* 368.4*    ProBNP (last 3 results) No results for input(s): PROBNP in the last 8760 hours.   D-Dimer No results for input(s): DDIMER in the last 72 hours. Hemoglobin A1C No results for input(s): HGBA1C in the last 72 hours. Fasting Lipid Panel No results for input(s): CHOL, HDL, LDLCALC, TRIG, CHOLHDL, LDLDIRECT in the last 72 hours. Thyroid Function Tests No results for input(s): TSH, T4TOTAL, T3FREE, THYROIDAB in the last 72 hours.  Invalid input(s): FREET3  Other results:   Imaging    No results found.   Medications:     Scheduled Medications: . aspirin EC  325 mg Oral Daily   Or  . aspirin  324 mg Per Tube Daily  . atorvastatin  80 mg Per Tube Daily  . bisacodyl  10 mg Oral Daily   Or  . bisacodyl  10 mg Rectal Daily  . chlorhexidine  15 mL Mouth Rinse BID  . Chlorhexidine Gluconate Cloth  6 each Topical Daily  . clonazePAM  0.5 mg Per Tube TID BM  . colchicine  0.6 mg Per Tube Daily  . digoxin  0.125 mg Per Tube Daily  . docusate  200 mg Per Tube Daily  . furosemide  40 mg Intravenous BID  . insulin aspart  0-24 Units Subcutaneous Q4H  . mouth rinse  15 mL Mouth Rinse q12n4p  . pantoprazole sodium  40 mg Per Tube Daily  . sodium chloride flush  10-40 mL Intracatheter Q12H  . sodium chloride flush  3 mL Intravenous Q12H  . traZODone  100 mg Per Tube QHS    Infusions: . sodium chloride 20 mL/hr at 07/12/20 1700  . sodium chloride    . sodium chloride 10 mL/hr at 07/14/20 0700  . sodium chloride 10 mL/hr at 07/14/20 0700  . albumin human 12.5 g (07/12/20 2148)  . amiodarone 30 mg/hr (07/14/20 0700)  . cefTAZidime (FORTAZ)  IV Stopped (07/14/20 0631)  . dexmedetomidine (PRECEDEX) IV infusion 0.9 mcg/kg/hr (07/14/20 0700)  . epinephrine Stopped (07/12/20 2358)  . feeding supplement (VITAL 1.5 CAL) 20 mL/hr at 07/12/20  1900  . fentaNYL infusion INTRAVENOUS Stopped (07/13/20  1117)  . impella catheter heparin 50 unit/mL in dextrose 5%    . heparin 600 Units/hr (07/14/20 0700)  . lactated ringers    . lactated ringers    . lactated ringers 20 mL/hr at 07/14/20 0700  . milrinone 0.2 mcg/kg/min (07/14/20 0700)  . nitroGLYCERIN Stopped (07/07/20 2047)  . norepinephrine (LEVOPHED) Adult infusion 5 mcg/min (07/14/20 0700)    PRN Medications: sodium chloride, acetaminophen, albumin human, lactated ringers, LORazepam, metoprolol tartrate, midazolam, morphine injection, ondansetron (ZOFRAN) IV, oxyCODONE, sodium chloride flush, sodium chloride flush, traMADol    Assessment/Plan   1. CAD: Late presentation inferior MI.  Cath showed thrombotic occlusion distal RCA, CTO LAD with collaterals from right, and complex 90% proximal LCx stenosis.  He had PTCA/thrombectomy RCA with good flow at end of procedure.  Suspect he had damage to LAD territory as well as RCA territory as RCA collateralized the LAD.  Now s/p CABG with LIMA-LAD, left radial to OM, seq SVG-PDA/PLV - ASA/statin.  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 Suspect post-MI pericarditis. Symptoms resolved prior to surgery.  Recurrent friction rub post-surgery.  - Restarted colchicine 0.6 mg daily.  3. Acute systolic CHF>>Cardiogenic Shock: Echo with EF 25-30%, RV ok (no evidence for RV infarct), LV thrombus.  Repeat bedside echo 06/27/20  EF 25%, normal RV w/ no pericardial effusion, no LV thrombus seen on this echo. Now with post-op shock, has Impella 5.5 in place.  Had to return to OR on 10/14 with large pericardial space hematoma obstructing the right atrium, hemodynamics much better since removal.  He is on norepinephrine 5, milrinone 0.2 with co-ox 55%.  Impella at P5 with good flow, no alarms, LDH low. Good waveforms. CVP 6, good diuresis so far, still above baseline  weight.  - Continue to wean NE as MAP tolerates - Continue milrinone 0.2, would leave here until Impella out.  - Got Lasix 40 mg IV x 1, will stop now with CVP down and likely start po tomorrow.  - Continue heparin for Impella, atrial fibrillation, and h/o LV thrombus  4. AF with RVR: Noted 10/12, now in NSR on amiodarone.  - Continue amio gtt while on NE and milrinone.  - continue heparin gtt.  5. LV thrombus: Resolved on 10/3 echo.  - on heparin gtt.  6. ID: Initial concern for RLL PNA. Completed 7 days vancomycin/cefepime.  Steady fever post-op WBC trending back up.  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 Now on Fortaz.  - will consult ID for assistance  7. Thrombocytopenia: Post-op. Now trending back up, 164K today.  8. S/p back surgery: Micro-discectomy just prior to admission.  Neurosurgery has seen.   9. Possible benzo/narcotic withdrawal: Concern GF brought him drugs pre-op.  10. Pericardial hematoma: Obstructed RA post-CABG, had to go back to OR on 10/14.   11. Anemia: Post-op blood loss.  - Transfuse to keep hgb > 8.  12. Acute hypoxic respiratory failure.   - extubated 10/19  13. Transaminitis:  Likely shock liver, LFTs trending down.    Robbie LisBrittainy Simmons, [place]  07/14/2020 7:37 AM   Patient seen with PA, agree with the above note.   Doing well today, tolerating Impella down to P5 with CI 2.7 this morning off Swan. Stable LDH.  CVP 5-6, excellent UOP continues.    General: NAD Neck: Swan. No JVD, no thyromegaly or thyroid nodule.  Lungs: Clear to auscultation bilaterally with  normal respiratory effort. CV: Nondisplaced PMI.  Heart regular S1/S2, no S3/S4, no murmur.  No peripheral edema.   Abdomen: Soft, nontender, no hepatosplenomegaly, no distention.  Skin: Intact without lesions or rashes.  Neurologic: Alert and oriented x 3.  Psych: Normal affect. Extremities: No clubbing or cyanosis.  HEENT: Normal.   Today will decrease Impella to P4.   Continue milrinone 0.2 and NE 5 for now.  Will try to decrease to P4 this afternoon with hope to remove Impella Thursday or Friday.   Filling pressures now low, got Lasix 40 mg IV this morning.  Will hold off on further IV Lasix today, probably to po tomorrow.   Now on ceftazidime for Burkholderia cepacia PNA.  Fever curve seems to be decreasing.   CRITICAL CARE Performed by: Marca Ancona  Total critical care time: 35 minutes  Critical care time was exclusive of separately billable procedures and treating other patients.  Critical care was necessary to treat or prevent imminent or life-threatening deterioration.  Critical care was time spent personally by me on the following activities: development of treatment plan with patient and/or surrogate as well as nursing, discussions with consultants, evaluation of patient's response to treatment, examination of patient, obtaining history from patient or surrogate, ordering and performing treatments and interventions, ordering and review of laboratory studies, ordering and review of radiographic studies, pulse oximetry and re-evaluation of patient's condition.  Marca Ancona 07/14/2020 9:00 AM

## 2020-07-14 NOTE — Progress Notes (Signed)
6 Days Post-Op Procedure(s) (LRB): EXPLORATION POST OPERATIVE OPEN HEART TAMPONADE (N/A) Subjective: C/o incisional pain.   Objective: Vital signs in last 24 hours: Temp:  [97.3 F (36.3 C)-100.8 F (38.2 C)] 97.3 F (36.3 C) (10/20 0805) Pulse Rate:  [77-112] 85 (10/20 0800) Cardiac Rhythm: Normal sinus rhythm (10/20 0800) Resp:  [12-28] 27 (10/20 0800) BP: (99-133)/(58-97) 105/86 (10/20 0800) SpO2:  [92 %-100 %] 98 % (10/20 0800) Arterial Line BP: (81-119)/(50-72) 102/66 (10/20 0800) Weight:  [77.2 kg] 77.2 kg (10/20 0600)  Hemodynamic parameters for last 24 hours: PAP: (21-38)/(5-20) 27/10 CVP:  [1 mmHg-11 mmHg] 4 mmHg  Intake/Output from previous day: 10/19 0701 - 10/20 0700 In: 3012.5 [I.V.:2032.5; NG/GT:60; IV Piggyback:665.8] Out: 6095 [Urine:5815; Chest Tube:280] Intake/Output this shift: Total I/O In: 96.3 [I.V.:85.7; Other:10.6] Out: 260 [Urine:200; Chest Tube:60]  General appearance: alert, cooperative and flat affect Neurologic: intact Heart: regular rate and rhythm and + soft rub Lungs: diminished breath sounds bibasilar Abdomen: normal findings: soft, non-tender Extremities: well perfused Wound: clean and dry  Lab Results: Recent Labs    07/13/20 0304 07/13/20 1054 07/13/20 1306 07/14/20 0400  WBC 13.5*  --   --  16.2*  HGB 9.5*   < > 10.5* 10.4*  HCT 30.8*   < > 31.0* 32.4*  PLT 92*  --   --  164   < > = values in this interval not displayed.   BMET:  Recent Labs    07/13/20 0304 07/13/20 1054 07/13/20 1306 07/14/20 0400  NA 138   < > 138 137  K 4.0   < > 4.2 4.0  CL 102  --   --  102  CO2 31  --   --  28  GLUCOSE 121*  --   --  114*  BUN 17  --   --  15  CREATININE 0.89  --   --  0.93  CALCIUM 7.8*  --   --  8.0*   < > = values in this interval not displayed.    PT/INR: No results for input(s): LABPROT, INR in the last 72 hours. ABG    Component Value Date/Time   PHART 7.478 (H) 07/13/2020 1306   HCO3 31.6 (H) 07/13/2020 1306    TCO2 33 (H) 07/13/2020 1306   ACIDBASEDEF 6.0 (H) 07/08/2020 1619   O2SAT 54.5 07/14/2020 0400   CBG (last 3)  Recent Labs    07/13/20 2316 07/14/20 0405 07/14/20 0808  GLUCAP 109* 104* 103*    Assessment/Plan: S/P Procedure(s) (LRB): EXPLORATION POST OPERATIVE OPEN HEART TAMPONADE (N/A) -Extubated yesterday CV- on p5 with Impella flow of 2.8 L/min, CI= 2.7  PA 24/12, CVP= 2  On milrinone 0.2, levophed down to 5  Co-ox down to 55 this AM RESP_ Extubated, sats 100% on 3L [place] RENAL- creatinine and lytes OK, 3L negative yesterday ENDO- CBG well controlled GI/ Nutrition- swallow eval this AM, advance diet if passes ID- low grade temp, WBC up from 13 to 16K  Now on Ceftazidime for Burkholderia cepacia- per Pharmacy rec   LOS: 19 days    Loreli Slot 07/14/2020

## 2020-07-14 NOTE — Progress Notes (Addendum)
ANTICOAGULATION CONSULT NOTE - Follow Up Consult  Pharmacy Consult for heparin Indication: Impella  Labs: Recent Labs    07/11/20 0333 07/11/20 1642 07/12/20 0500 07/12/20 0500 07/12/20 1627 07/13/20 0304 07/13/20 0304 07/13/20 1054 07/13/20 1306 07/13/20 1554 07/13/20 2317  HGB   < > 9.4* 10.4*   < > 10.5* 9.5*   < > 10.9* 10.5*  --   --   HCT   < > 29.6* 31.8*   < > 31.0* 30.8*  --  32.0* 31.0*  --   --   PLT  --  66* 84*  --   --  92*  --   --   --   --   --   HEPARINUNFRC   < >  --  <0.10*   < >  --  <0.10*  --   --   --  <0.10* <0.10*  CREATININE   < > 1.16 0.99  --  0.80 0.89  --   --   --   --   --    < > = values in this interval not displayed.    Assessment: 49yo male subtherapeutic on heparin after rate change; no gtt issues or signs of bleeding per RN.  Purge running at 11.2 ml/hr (~550 units/hr).  Goal of Therapy:  Heparin level 0.2-0.5 units/ml   Plan:  Will increase heparin gtt by 3 units/kg/hr to 500 units/hr and check level with am labs.    Vernard Gambles, PharmD, BCPS  07/14/2020,12:21 AM   Addendum: Heparin level remains below goal but now increasing. Will increase systemic heparin gtt by 1-2 units/kg/hr to 600 units/hr and check level in 6 hours.

## 2020-07-15 ENCOUNTER — Inpatient Hospital Stay (HOSPITAL_COMMUNITY): Payer: 59 | Admitting: Certified Registered Nurse Anesthetist

## 2020-07-15 ENCOUNTER — Inpatient Hospital Stay (HOSPITAL_COMMUNITY): Payer: 59

## 2020-07-15 ENCOUNTER — Encounter (HOSPITAL_COMMUNITY)
Admission: EM | Disposition: A | Discharge status: Home / Self Care | Payer: Self-pay | Source: Home / Self Care | Attending: Thoracic Surgery (Cardiothoracic Vascular Surgery)

## 2020-07-15 DIAGNOSIS — I2510 Atherosclerotic heart disease of native coronary artery without angina pectoris: Secondary | ICD-10-CM

## 2020-07-15 DIAGNOSIS — R570 Cardiogenic shock: Secondary | ICD-10-CM | POA: Diagnosis not present

## 2020-07-15 DIAGNOSIS — Z95811 Presence of heart assist device: Secondary | ICD-10-CM | POA: Diagnosis not present

## 2020-07-15 DIAGNOSIS — I340 Nonrheumatic mitral (valve) insufficiency: Secondary | ICD-10-CM

## 2020-07-15 HISTORY — PX: PLACEMENT OF IMPELLA LEFT VENTRICULAR ASSIST DEVICE: SHX6519

## 2020-07-15 LAB — CBC
HCT: 31.9 % — ABNORMAL LOW (ref 39.0–52.0)
Hemoglobin: 10.1 g/dL — ABNORMAL LOW (ref 13.0–17.0)
MCH: 28.5 pg (ref 26.0–34.0)
MCHC: 31.7 g/dL (ref 30.0–36.0)
MCV: 90.1 fL (ref 80.0–100.0)
Platelets: 230 10*3/uL (ref 150–400)
RBC: 3.54 MIL/uL — ABNORMAL LOW (ref 4.22–5.81)
RDW: 13.6 % (ref 11.5–15.5)
WBC: 17 10*3/uL — ABNORMAL HIGH (ref 4.0–10.5)
nRBC: 0 % (ref 0.0–0.2)

## 2020-07-15 LAB — LACTATE DEHYDROGENASE: LDH: 315 U/L — ABNORMAL HIGH (ref 98–192)

## 2020-07-15 LAB — POCT I-STAT 7, (LYTES, BLD GAS, ICA,H+H)
Acid-Base Excess: 2 mmol/L (ref 0.0–2.0)
Bicarbonate: 27.7 mmol/L (ref 20.0–28.0)
Calcium, Ion: 1.15 mmol/L (ref 1.15–1.40)
HCT: 32 % — ABNORMAL LOW (ref 39.0–52.0)
Hemoglobin: 10.9 g/dL — ABNORMAL LOW (ref 13.0–17.0)
O2 Saturation: 100 %
Potassium: 4 mmol/L (ref 3.5–5.1)
Sodium: 139 mmol/L (ref 135–145)
TCO2: 29 mmol/L (ref 22–32)
pCO2 arterial: 47.1 mmHg (ref 32.0–48.0)
pH, Arterial: 7.378 (ref 7.350–7.450)
pO2, Arterial: 257 mmHg — ABNORMAL HIGH (ref 83.0–108.0)

## 2020-07-15 LAB — GLUCOSE, CAPILLARY
Glucose-Capillary: 106 mg/dL — ABNORMAL HIGH (ref 70–99)
Glucose-Capillary: 109 mg/dL — ABNORMAL HIGH (ref 70–99)
Glucose-Capillary: 109 mg/dL — ABNORMAL HIGH (ref 70–99)
Glucose-Capillary: 111 mg/dL — ABNORMAL HIGH (ref 70–99)
Glucose-Capillary: 118 mg/dL — ABNORMAL HIGH (ref 70–99)
Glucose-Capillary: 58 mg/dL — ABNORMAL LOW (ref 70–99)
Glucose-Capillary: 97 mg/dL (ref 70–99)

## 2020-07-15 LAB — POCT I-STAT, CHEM 8
BUN: 10 mg/dL (ref 6–20)
Calcium, Ion: 1.18 mmol/L (ref 1.15–1.40)
Chloride: 102 mmol/L (ref 98–111)
Creatinine, Ser: 0.7 mg/dL (ref 0.61–1.24)
Glucose, Bld: 111 mg/dL — ABNORMAL HIGH (ref 70–99)
HCT: 32 % — ABNORMAL LOW (ref 39.0–52.0)
Hemoglobin: 10.9 g/dL — ABNORMAL LOW (ref 13.0–17.0)
Potassium: 3.9 mmol/L (ref 3.5–5.1)
Sodium: 139 mmol/L (ref 135–145)
TCO2: 26 mmol/L (ref 22–32)

## 2020-07-15 LAB — BASIC METABOLIC PANEL
Anion gap: 10 (ref 5–15)
BUN: 12 mg/dL (ref 6–20)
CO2: 24 mmol/L (ref 22–32)
Calcium: 8 mg/dL — ABNORMAL LOW (ref 8.9–10.3)
Chloride: 104 mmol/L (ref 98–111)
Creatinine, Ser: 0.92 mg/dL (ref 0.61–1.24)
GFR, Estimated: >60 mL/min (ref >60)
Glucose, Bld: 112 mg/dL — ABNORMAL HIGH (ref 70–99)
Potassium: 3.7 mmol/L (ref 3.5–5.1)
Sodium: 138 mmol/L (ref 135–145)

## 2020-07-15 LAB — COOXEMETRY PANEL
Carboxyhemoglobin: 1.7 % — ABNORMAL HIGH (ref 0.5–1.5)
Methemoglobin: 0.9 % (ref 0.0–1.5)
O2 Saturation: 61.8 %
Total hemoglobin: 10.5 g/dL — ABNORMAL LOW (ref 12.0–16.0)

## 2020-07-15 LAB — HEPARIN LEVEL (UNFRACTIONATED): Heparin Unfractionated: <0.1 IU/mL — ABNORMAL LOW (ref 0.30–0.70)

## 2020-07-15 LAB — TYPE AND SCREEN
ABO/RH(D): O POS
Antibody Screen: NEGATIVE

## 2020-07-15 SURGERY — INSERTION, CARDIAC ASSIST DEVICE, IMPELLA
Anesthesia: General | Site: Chest

## 2020-07-15 MED ORDER — DEXAMETHASONE SODIUM PHOSPHATE 10 MG/ML IJ SOLN
INTRAMUSCULAR | Status: DC | PRN
  Administered 2020-07-15: 8 mg via INTRAVENOUS

## 2020-07-15 MED ORDER — MIDAZOLAM HCL 2 MG/2ML IJ SOLN
INTRAMUSCULAR | Status: AC
  Filled 2020-07-15: qty 2

## 2020-07-15 MED ORDER — SUGAMMADEX SODIUM 200 MG/2ML IV SOLN
INTRAVENOUS | Status: DC | PRN
  Administered 2020-07-15: 200 mg via INTRAVENOUS

## 2020-07-15 MED ORDER — LACTATED RINGERS IV SOLN: INTRAVENOUS | Status: DC | PRN

## 2020-07-15 MED ORDER — ETOMIDATE 2 MG/ML IV SOLN
INTRAVENOUS | Status: AC
  Filled 2020-07-15: qty 10

## 2020-07-15 MED ORDER — VANCOMYCIN HCL 1500 MG/300ML IV SOLN
1500.0000 mg | Freq: Once | INTRAVENOUS | Status: DC
  Filled 2020-07-15: qty 300

## 2020-07-15 MED ORDER — VANCOMYCIN HCL 1000 MG IV SOLR
INTRAVENOUS | Status: DC | PRN
  Administered 2020-07-15: 1500 mg via INTRAVENOUS

## 2020-07-15 MED ORDER — ROCURONIUM BROMIDE 10 MG/ML (PF) SYRINGE
PREFILLED_SYRINGE | INTRAVENOUS | Status: DC | PRN
  Administered 2020-07-15: 60 mg via INTRAVENOUS

## 2020-07-15 MED ORDER — VANCOMYCIN HCL IN DEXTROSE 1-5 GM/200ML-% IV SOLN: 1000.0000 mg | INTRAVENOUS | Status: DC

## 2020-07-15 MED ORDER — ONDANSETRON HCL 4 MG/2ML IJ SOLN
INTRAMUSCULAR | Status: AC
  Filled 2020-07-15: qty 2

## 2020-07-15 MED ORDER — PROPOFOL 10 MG/ML IV BOLUS
INTRAVENOUS | Status: AC
  Filled 2020-07-15: qty 20

## 2020-07-15 MED ORDER — PANTOPRAZOLE SODIUM 40 MG PO TBEC
40.0000 mg | DELAYED_RELEASE_TABLET | Freq: Every day | ORAL | Status: DC
  Administered 2020-07-15 – 2020-07-17 (×3): 40 mg via ORAL
  Filled 2020-07-15 (×3): qty 1

## 2020-07-15 MED ORDER — ETOMIDATE 2 MG/ML IV SOLN
INTRAVENOUS | Status: DC | PRN
  Administered 2020-07-15: 12 mg via INTRAVENOUS

## 2020-07-15 MED ORDER — POTASSIUM CHLORIDE 10 MEQ/50ML IV SOLN
10.0000 meq | INTRAVENOUS | Status: AC
  Administered 2020-07-15 (×3): 10 meq via INTRAVENOUS
  Filled 2020-07-15 (×3): qty 50

## 2020-07-15 MED ORDER — FENTANYL CITRATE (PF) 100 MCG/2ML IJ SOLN
INTRAMUSCULAR | Status: DC | PRN
  Administered 2020-07-15: 50 ug via INTRAVENOUS

## 2020-07-15 MED ORDER — ONDANSETRON HCL 4 MG/2ML IJ SOLN
INTRAMUSCULAR | Status: DC | PRN
  Administered 2020-07-15: 4 mg via INTRAVENOUS

## 2020-07-15 MED ORDER — 0.9 % SODIUM CHLORIDE (POUR BTL) OPTIME
TOPICAL | Status: DC | PRN
  Administered 2020-07-15: 2000 mL

## 2020-07-15 MED ORDER — FUROSEMIDE 40 MG PO TABS
40.0000 mg | ORAL_TABLET | Freq: Every day | ORAL | Status: DC
  Administered 2020-07-15 – 2020-07-21 (×7): 40 mg via ORAL
  Filled 2020-07-15 (×7): qty 1

## 2020-07-15 MED ORDER — DEXAMETHASONE SODIUM PHOSPHATE 10 MG/ML IJ SOLN
INTRAMUSCULAR | Status: AC
  Filled 2020-07-15: qty 1

## 2020-07-15 MED ORDER — FENTANYL CITRATE (PF) 250 MCG/5ML IJ SOLN
INTRAMUSCULAR | Status: AC
  Filled 2020-07-15: qty 5

## 2020-07-15 MED ORDER — ROCURONIUM BROMIDE 10 MG/ML (PF) SYRINGE
PREFILLED_SYRINGE | INTRAVENOUS | Status: AC
  Filled 2020-07-15: qty 30

## 2020-07-15 SURGICAL SUPPLY — 50 items
ATTRACTOMAT 16X20 MAGNETIC DRP (DRAPES) ×1 IMPLANT
BAG DECANTER FOR FLEXI CONT (MISCELLANEOUS) ×2 IMPLANT
BLADE CLIPPER SURG (BLADE) ×1 IMPLANT
BLADE SURG 12 STRL SS (BLADE) ×1 IMPLANT
CANISTER SUCT 3000ML PPV (MISCELLANEOUS) ×2 IMPLANT
CLEANER TIP ELECTROSURG 2X2 (MISCELLANEOUS) ×1 IMPLANT
CLIP VESOCCLUDE MED 24/CT (CLIP) ×1 IMPLANT
CLIP VESOCCLUDE SM WIDE 24/CT (CLIP) ×1 IMPLANT
COVER SURGICAL LIGHT HANDLE (MISCELLANEOUS) ×3 IMPLANT
DRAPE CHEST BREAST 15X10 FENES (DRAPES) ×1 IMPLANT
DRAPE SLUSH/WARMER DISC (DRAPES) ×2 IMPLANT
DRSG AQUACEL AG ADV 3.5X 6 (GAUZE/BANDAGES/DRESSINGS) ×2 IMPLANT
ELECT BLADE 4.0 EZ CLEAN MEGAD (MISCELLANEOUS)
ELECT BLADE 6.5 EXT (BLADE) ×1 IMPLANT
ELECT CAUTERY BLADE 6.4 (BLADE) ×1 IMPLANT
ELECT REM PT RETURN 9FT ADLT (ELECTROSURGICAL) ×4
ELECTRODE BLDE 4.0 EZ CLN MEGD (MISCELLANEOUS) ×1 IMPLANT
ELECTRODE REM PT RTRN 9FT ADLT (ELECTROSURGICAL) ×2 IMPLANT
GAUZE SPONGE 4X4 12PLY STRL (GAUZE/BANDAGES/DRESSINGS) ×3 IMPLANT
GLOVE BIO SURGEON STRL SZ7.5 (GLOVE) ×3 IMPLANT
GLOVE SURG SIGNA 7.5 PF LTX (GLOVE) ×1 IMPLANT
GOWN STRL REUS W/ TWL LRG LVL3 (GOWN DISPOSABLE) ×4 IMPLANT
GOWN STRL REUS W/TWL LRG LVL3 (GOWN DISPOSABLE) ×8
HANDLE STAPLE  ENDO EGIA 4 STD (STAPLE) ×2
HANDLE STAPLE ENDO EGIA 4 STD (STAPLE) IMPLANT
INSERT FOGARTY SM (MISCELLANEOUS) ×1 IMPLANT
KIT BASIN OR (CUSTOM PROCEDURE TRAY) ×2 IMPLANT
KIT TURNOVER KIT B (KITS) ×2 IMPLANT
NS IRRIG 1000ML POUR BTL (IV SOLUTION) ×4 IMPLANT
PACK GENERAL/GYN (CUSTOM PROCEDURE TRAY) ×2 IMPLANT
PACK OPEN HEART (CUSTOM PROCEDURE TRAY) ×1 IMPLANT
PACK UNIVERSAL I (CUSTOM PROCEDURE TRAY) ×1 IMPLANT
PAD ARMBOARD 7.5X6 YLW CONV (MISCELLANEOUS) ×4 IMPLANT
PAD ELECT DEFIB RADIOL ZOLL (MISCELLANEOUS) ×2 IMPLANT
POSITIONER HEAD DONUT 9IN (MISCELLANEOUS) ×2 IMPLANT
RELOAD TRI 2.0 30 VAS MED SUL (STAPLE) ×1 IMPLANT
SET TUBE SMOKE EVAC HIGH FLOW (TUBING) ×1 IMPLANT
STAPLER VISISTAT 35W (STAPLE) ×1 IMPLANT
SUT PROLENE 4 0 RB 1 (SUTURE) ×2
SUT PROLENE 4-0 RB1 .5 CRCL 36 (SUTURE) ×1 IMPLANT
SUT VIC AB 1 CTX 36 (SUTURE)
SUT VIC AB 1 CTX36XBRD ANBCTR (SUTURE) ×2 IMPLANT
SUT VIC AB 2-0 CT1 27 (SUTURE) ×4
SUT VIC AB 2-0 CT1 TAPERPNT 27 (SUTURE) ×2 IMPLANT
SUT VIC AB 3-0 X1 27 (SUTURE) ×3 IMPLANT
SYR BULB IRRIG 60ML STRL (SYRINGE) ×1 IMPLANT
TAPE CLOTH SURG 4X10 WHT LF (GAUZE/BANDAGES/DRESSINGS) ×1 IMPLANT
TOWEL GREEN STERILE (TOWEL DISPOSABLE) ×2 IMPLANT
TOWEL GREEN STERILE FF (TOWEL DISPOSABLE) ×2 IMPLANT
WATER STERILE IRR 1000ML POUR (IV SOLUTION) ×2 IMPLANT

## 2020-07-15 NOTE — Transfer of Care (Signed)
Immediate Anesthesia Transfer of Care Note  Patient: Engineer, site  Procedure(s) Performed: REMOVAL OF IMPELLA LEFT VENTRICULAR ASSIST DEVICE (N/A Chest)  Patient Location: SICU  Anesthesia Type:General  Level of Consciousness: awake, alert  and oriented  Airway & Oxygen Therapy: Patient Spontanous Breathing and Patient connected to nasal cannula oxygen  Post-op Assessment: Report given to RN, Post -op Vital signs reviewed and stable and Patient moving all extremities  Post vital signs: Reviewed and stable  Last Vitals:  Vitals Value Taken Time  BP    Temp    Pulse 95 07/15/20 1434  Resp 25 07/15/20 1434  SpO2 95 % 07/15/20 1434  Vitals shown include unvalidated device data.  Last Pain:  Vitals:   07/15/20 0800  TempSrc:   PainSc: 0-No pain      Patients Stated Pain Goal: 3 (07/13/20 1200)  Complications: No complications documented.

## 2020-07-15 NOTE — Brief Op Note (Signed)
07/15/2020  7:18 PM  PATIENT:  Jerry [name]  49 y.o. male  PRE-OPERATIVE DIAGNOSIS:  HEART FAILURE  POST-OPERATIVE DIAGNOSIS:  HEART FAILURE  PROCEDURE:  Procedure(s): REMOVAL OF IMPELLA LEFT VENTRICULAR ASSIST DEVICE (N/A)  SURGEON:  Surgeon(s) and Role:    * Loreli Slot, MD - Primary  PHYSICIAN ASSISTANT:   ASSISTANTS: none   ANESTHESIA:   general  EBL:  minimal  BLOOD ADMINISTERED:none  DRAINS: none   LOCAL MEDICATIONS USED:  NONE  SPECIMEN:  No Specimen  DISPOSITION OF SPECIMEN:  N/A  COUNTS:  YES  TOURNIQUET:  * No tourniquets in log *  DICTATION: .Other Dictation: Dictation Number -  PLAN OF CARE: Admit to inpatient   PATIENT DISPOSITION:  ICU - extubated and stable.   Delay start of Pharmacological VTE agent (>24hrs) due to surgical blood loss or risk of bleeding: no

## 2020-07-15 NOTE — Progress Notes (Signed)
SLP Cancellation Note  Patient Details Name: Jerry [name] MRN: [id number] DOB: 11-May-1971   Cancelled treatment:       Reason Eval/Treat Not Completed: Patient at procedure or test/unavailable (Pt currently in OR. SLP will f/u on subsequent date.)   I. Jerry Clock, MS, CCC-SLP Acute Rehabilitation Services Office number [phone] Pager [phone]  Jerry [name] 07/15/2020, 1:43 PM

## 2020-07-15 NOTE — Anesthesia Procedure Notes (Signed)
Procedure Name: Intubation Date/Time: 07/15/2020 1:37 PM Performed by: Jodell Cipro, CRNA Pre-anesthesia Checklist: Patient identified, Emergency Drugs available, Suction available and Patient being monitored Patient Re-evaluated:Patient Re-evaluated prior to induction Oxygen Delivery Method: Circle System Utilized Preoxygenation: Pre-oxygenation with 100% oxygen Induction Type: IV induction Ventilation: Mask ventilation without difficulty Tube type: Oral Tube size: 8.0 mm Number of attempts: 1 Airway Equipment and Method: Stylet and Oral airway Placement Confirmation: ETT inserted through vocal cords under direct vision,  positive ETCO2 and breath sounds checked- equal and bilateral Secured at: 23 cm Tube secured with: Tape Dental Injury: Teeth and Oropharynx as per pre-operative assessment

## 2020-07-15 NOTE — Progress Notes (Signed)
ANTICOAGULATION CONSULT NOTE - Follow Up Consult  Pharmacy Consult for Heparin Indication: LV thrombus?  Labs: Recent Labs    07/13/20 0304 07/13/20 1054 07/14/20 0400 07/14/20 0400 07/14/20 1200 07/14/20 2004 07/15/20 0423 07/15/20 0423 07/15/20 1344 07/15/20 1347  HGB 9.5*   < > 10.4*   < >  --   --  10.1*   < > 10.9* 10.9*  HCT 30.8*   < > 32.4*   < >  --   --  31.9*  --  32.0* 32.0*  PLT 92*  --  164  --   --   --  230  --   --   --   HEPARINUNFRC <0.10*   < > 0.13*   < > <0.10* <0.10* <0.10*  --   --   --   CREATININE 0.89  --  0.93  --   --   --  0.92  --   --  0.70   < > = values in this interval not displayed.    Assessment: 49 YOM on Heparin for Impella - now removed and resumed post-op - noted concern earlier this admission for LV thrombus.   The patient was previously therapeutic on 1950 units/hr. Per discussion with Dr. Dorris Fetch - to keep the drip low and without a bolus for now. The RN resumed the rate at 800 units/hr as it was pre-op however this was noted to be in addition to the purge solution. Will increase to 1000 units/hr which is still "low" per Dr. Sunday Corn request. We attempt to clarify further on indication and if the drip should be titrated up to goal range with AM rounds.  Goal of Therapy:  Heparin level 0.3-0.5 units/ml   Plan:  - Heparin at 1000 units/hr - Low rate and no bolus for now per Dr. Dorris Fetch - Will continue to monitor for any signs/symptoms of bleeding and will follow up with heparin level in the a.m.   Thank you for allowing pharmacy to be a part of this patient's care.  Georgina Pillion, PharmD, BCPS Clinical Pharmacist Clinical phone for 07/15/2020: [phone] 07/15/2020 9:27 PM   **Pharmacist phone directory can now be found on [email or web address] ([email or web address]).  Listed under [facility].

## 2020-07-15 NOTE — Progress Notes (Addendum)
Patient ID: Jerry [name], male   DOB: [birth year], 49 y.o.   MRN: [id number] P    Advanced Heart Failure Rounding Note  PCP-Cardiologist: Tonny Bollman, MD    Patient Profile   49 y/o male s/p recent back surgery, admitted for acute inferior and anterolateral MI. LHC showed thrombotic occlusion of distal RCA that was treated with PTCA and thrombectomy.  He was also noted to have chronic total occlusion of the LAD with collaterals from the right (thus this territory was affected by the RCA MI) as well as 90% complex proximal LCx stenosis. EF 25-30% + apical thrombus. RV normal. Course b/c cardiogenic shock and acute hypoxic respiratory failure. ? Component of septic shock.    Subjective:    - CABG + MV repair and placement of Impella 5.5 on 10/13.  - Back to OR on 10/14 with large hematoma obstructing right atrium.  - Extubated 10/19   Tolerating Impella wean, now at P-3. CI 3.7. Co-ox 62%. Remains on milrinone 0.25 + NE 6.  CVP 8  Continues w/ low grade fevers, Tmax 100.2 overnight. WBC trending back up, 15>>13>>16>17K. Trach aspirate now growing Burkholderia cepacia. Abx switched to [place].    Much more alert today. Says he is feeling much more better.   Swan numbers: CVP 8  PA 35/16 (21) CI 3.6  Impella 5.5 P3 with flow 1.6 L/min Good waveforms. Better pulsatility.  LDH 428 -> 754 -> 704 -> 377 -> 307->300->315   Objective:   Weight Range: 73.3 kg Body mass index is 24.57 kg/m.   Vital Signs:   Temp:  [99 F (37.2 C)-100.2 F (37.9 C)] 99.5 F (37.5 C) (10/21 0615) Pulse Rate:  [54-112] 102 (10/21 0615) Resp:  [16-33] 26 (10/21 0615) BP: (89-150)/(65-82) 120/69 (10/21 0600) SpO2:  [91 %-100 %] 98 % (10/21 0615) Arterial Line BP: (74-133)/(49-71) 121/66 (10/21 0615) Weight:  [73.3 kg] 73.3 kg (10/21 0500) Last BM Date: 07/08/20 (last documented)  Weight change: Filed Weights   07/13/20 0500 07/14/20 0600 07/15/20 0500  Weight: 78.2 kg 77.2 kg 73.3 kg     Intake/Output:   Intake/Output Summary (Last 24 hours) at 07/15/2020 0805 Last data filed at 07/15/2020 0700 Gross per 24 hour  Intake 2839.05 ml  Output 3795 ml  Net -955.95 ml      Physical Exam   CVP 8 General:  Well appearing, sitting up in bed. No respiratory difficulty HEENT: normal Neck: supple. JVD ~8 cm + Rt IJ Swan. Carotids 2+ bilat; no bruits. No lymphadenopathy or thyromegaly appreciated. Cor: PMI nondisplaced. Regular rate & rhythm. No rubs, gallops or murmurs. sternotomy site stable  Lungs: clear Abdomen: soft, nontender, nondistended. No hepatosplenomegaly. No bruits or masses. Good bowel sounds. Extremities: no cyanosis, clubbing, rash, no edema Neuro: alert & oriented x 3, cranial nerves grossly intact. moves all 4 extremities w/o difficulty. Affect pleasant.  Telemetry   NSR 90s  Personally reviewed  Labs    CBC Recent Labs    07/14/20 0400 07/15/20 0423  WBC 16.2* 17.0*  HGB 10.4* 10.1*  HCT 32.4* 31.9*  MCV 92.3 90.1  PLT 164 230   Basic Metabolic Panel Recent Labs    37/16/96 0400 07/15/20 0423  NA 137 138  K 4.0 3.7  CL 102 104  CO2 28 24  GLUCOSE 114* 112*  BUN 15 12  CREATININE 0.93 0.92  CALCIUM 8.0* 8.0*  MG 2.1  --    Liver Function Tests Recent Labs    07/13/20 0304  07/14/20 0400  AST 116* 66*  ALT 580* 368*  ALKPHOS 65 67  BILITOT 2.1* 1.8*  PROT 5.1* 5.2*  ALBUMIN 2.3* 2.3*   No results for input(s): LIPASE, AMYLASE in the last 72 hours. Cardiac Enzymes No results for input(s): CKTOTAL, CKMB, CKMBINDEX, TROPONINI in the last 72 hours.  BNP: BNP (last 3 results) Recent Labs    06/25/20 2334 06/26/20 0150  BNP 349.1* 368.4*    ProBNP (last 3 results) No results for input(s): PROBNP in the last 8760 hours.   D-Dimer No results for input(s): DDIMER in the last 72 hours. Hemoglobin A1C No results for input(s): HGBA1C in the last 72 hours. Fasting Lipid Panel No results for input(s): CHOL, HDL,  LDLCALC, TRIG, CHOLHDL, LDLDIRECT in the last 72 hours. Thyroid Function Tests No results for input(s): TSH, T4TOTAL, T3FREE, THYROIDAB in the last 72 hours.  Invalid input(s): FREET3  Other results:   Imaging    DG Chest Port 1 View  Result Date: 07/15/2020 CLINICAL DATA:  CABG EXAM: PORTABLE CHEST 1 VIEW COMPARISON:  07/13/20 FINDINGS: Similar postoperative changes in the mediastinum, right clavicle, right axilla, and left lung apex. Similar positioning of a Swan-Ganz catheter, right central venous catheter, bilateral chest tubes. Interval extubation. Similar mild enlargement the cardiac silhouette. Similar bilateral perihilar and basilar opacities with small bilateral pleural effusions. Slightly lower lung volumes. IMPRESSION: Interval extubation with slightly lower lung volumes. Otherwise, no significant interval change. Electronically Signed   By: Feliberto Harts MD   On: 07/15/2020 07:18     Medications:     Scheduled Medications: . aspirin EC  325 mg Oral Daily   Or  . aspirin  324 mg Per Tube Daily  . atorvastatin  80 mg Per Tube Daily  . bisacodyl  10 mg Oral Daily   Or  . bisacodyl  10 mg Rectal Daily  . chlorhexidine  15 mL Mouth Rinse BID  . Chlorhexidine Gluconate Cloth  6 each Topical Daily  . clonazepam  0.5 mg Oral TID BM  . colchicine  0.6 mg Per Tube Daily  . digoxin  0.125 mg Per Tube Daily  . docusate  200 mg Per Tube Daily  . insulin aspart  0-24 Units Subcutaneous Q4H  . mouth rinse  15 mL Mouth Rinse q12n4p  . pantoprazole (PROTONIX) IV  40 mg Intravenous Q24H  . sodium chloride flush  10-40 mL Intracatheter Q12H  . sodium chloride flush  3 mL Intravenous Q12H  . traZODone  100 mg Per Tube QHS    Infusions: . sodium chloride 20 mL/hr at 07/12/20 1700  . sodium chloride Stopped (07/15/20 0533)  . sodium chloride Stopped (07/14/20 1900)  . albumin human 12.5 g (07/12/20 2148)  . amiodarone 30 mg/hr (07/15/20 0600)  . cefTAZidime (FORTAZ)  IV  200 mL/hr at 07/15/20 0600  . dexmedetomidine (PRECEDEX) IV infusion Stopped (07/14/20 1627)  . epinephrine Stopped (07/12/20 2358)  . fentaNYL infusion INTRAVENOUS Stopped (07/13/20 1117)  . impella catheter heparin 50 unit/mL in dextrose 5%    . heparin 800 Units/hr (07/15/20 0749)  . lactated ringers 10 mL/hr at 07/15/20 0800  . milrinone 0.2 mcg/kg/min (07/15/20 0759)  . norepinephrine (LEVOPHED) Adult infusion 6 mcg/min (07/15/20 0600)  . potassium chloride 10 mEq (07/15/20 0757)    PRN Medications: sodium chloride, acetaminophen, albumin human, ALPRAZolam, metoprolol tartrate, ondansetron (ZOFRAN) IV, oxyCODONE, sodium chloride flush, sodium chloride flush, traMADol    Assessment/Plan   1. CAD: Late presentation inferior MI.  Cath showed thrombotic occlusion distal RCA, CTO LAD with collaterals from right, and complex 90% proximal LCx stenosis.  He had PTCA/thrombectomy RCA with good flow at end of procedure.  Suspect he had damage to LAD territory as well as RCA territory as RCA collateralized the LAD.  Now s/p CABG with LIMA-LAD, left radial to OM, seq SVG-PDA/PLV - ASA/statin.  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 Suspect post-MI pericarditis. Symptoms resolved prior to surgery.  Recurrent friction rub post-surgery.  - Restarted colchicine 0.6 mg daily.  3. Acute systolic CHF>>Cardiogenic Shock: Echo with EF 25-30%, RV ok (no evidence for RV infarct), LV thrombus.  Repeat bedside echo 06/27/20  EF 25%, normal RV w/ no pericardial effusion, no LV thrombus seen on this echo. Now with post-op shock, has Impella 5.5 in place.  Had to return to OR on 10/14 with large pericardial space hematoma obstructing the right atrium, hemodynamics much better since removal.  He is on norepinephrine 6, milrinone 0.2 with co-ox 62%.  Impella at P3 with good flow, no alarms, LDH low. Good waveforms. CVP 8, good  diuresis w/ IV Lasix. Now back below preop wt.  - start PO Lasix today 40 mg daily.   - Continue to wean NE as MAP tolerates - Continue milrinone 0.2, would leave here until Impella out.   - Continue heparin for Impella, atrial fibrillation, and h/o LV thrombus  4. AF with RVR: Noted 10/12, now in NSR on amiodarone.  - Continue amio gtt while on NE and milrinone.  - continue heparin gtt.  5. LV thrombus: Resolved on 10/3 echo.  - on heparin gtt.  6. ID: Initial concern for RLL PNA. Completed 7 days vancomycin/cefepime.  Steady fever post-op WBC trending back up.  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 Now on Fortaz.  - d/w ID, continue Fortaz   7. Thrombocytopenia: Post-op. Now trending back up, 230K today.  8. S/p back surgery: Micro-discectomy just prior to admission.  Neurosurgery has seen.   9. Possible benzo/narcotic withdrawal: Concern GF brought him drugs pre-op.  10. Pericardial hematoma: Obstructed RA post-CABG, had to go back to OR on 10/14.   11. Anemia: Post-op blood loss.  - Transfuse to keep hgb > 8.  12. Acute hypoxic respiratory failure.   - extubated 10/19  13. Transaminitis:  Likely shock liver, LFTs trending down.    Robbie LisBrittainy Simmons, PA-C  07/15/2020 8:05 AM   Patient seen with PA, agree with the above note.   He diuresed well again yesterday, weight down near pre-op baseline.  Feels good today.  Good cardiac output off Swan and co-ox 62%.  CVP 8. Remains in NSR on amiodarone.  Tolerating Impella wean to P3.   General: NAD Neck: No JVD, no thyromegaly or thyroid nodule.  Lungs: Mildly decreased at bases.  CV: Nondisplaced PMI.  Heart regular S1/S2, no S3/S4, no murmur. Rub still noted.  No peripheral edema.   Abdomen: Soft, nontender, no hepatosplenomegaly, no distention.  Skin: Intact without lesions or rashes.  Neurologic: Alert and oriented x 3.  Psych: Normal affect. Extremities: No clubbing or cyanosis.  HEENT: Normal.   Doing well  today.  Will remove Impella and Swan this afternoon, has PICC for CVP and co-ox.  Continue milrinone, can wean NE to down to 3 or so but would leave on a low dose until we see how he does with Impella out.  Transition to Lasix 40 mg po daily.   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  Still with low grade fevers.  Also remains on colchicine for post-infarct pericarditis.   CRITICAL CARE Performed by: Marca Ancona  Total critical care time: 35 minutes  Critical care time was exclusive of separately billable procedures and treating other patients.  Critical care was necessary to treat or prevent imminent or life-threatening deterioration.  Critical care was time spent personally by me on the following activities: development of treatment plan with patient and/or surrogate as well as nursing, discussions with consultants, evaluation of patient's response to treatment, examination of patient, obtaining history from patient or surrogate, ordering and performing treatments and interventions, ordering and review of laboratory studies, ordering and review of radiographic studies, pulse oximetry and re-evaluation of patient's condition.  Marca Ancona 07/15/2020 8:58 AM

## 2020-07-15 NOTE — Progress Notes (Signed)
7 Days Post-Op Procedure(s) (LRB): EXPLORATION POST OPERATIVE OPEN HEART TAMPONADE (N/A) Subjective: Some incisional pain  Objective: Vital signs in last 24 hours: Temp:  [99 F (37.2 C)-100.2 F (37.9 C)] 99.1 F (37.3 C) (10/21 0832) Pulse Rate:  [54-112] 102 (10/21 0832) Cardiac Rhythm: Normal sinus rhythm;Sinus tachycardia (10/21 0400) Resp:  [16-33] 24 (10/21 0832) BP: (89-150)/(65-82) 146/80 (10/21 0800) SpO2:  [91 %-100 %] 99 % (10/21 0832) Arterial Line BP: (74-133)/(49-73) 125/73 (10/21 0832) Weight:  [73.3 kg] 73.3 kg (10/21 0500)  Hemodynamic parameters for last 24 hours: PAP: (18-39)/(3-19) 34/18 CVP:  [0 mmHg-9 mmHg] 8 mmHg PCWP:  [8 mmHg-18 mmHg] 18 mmHg CO:  [6.6 L/min-7.4 L/min] 7.4 L/min CI:  [3.6 L/min/m2-4 L/min/m2] 4 L/min/m2  Intake/Output from previous day: 10/20 0701 - 10/21 0700 In: 2935.3 [I.V.:2400.4; IV Piggyback:286.2] Out: 4055 [Urine:3675; Chest Tube:380] Intake/Output this shift: Total I/O In: 10.2 [Other:10.2] Out: -   General appearance: alert, cooperative and no distress Neurologic: intact Heart: regular rate and rhythm Lungs: diminished breath sounds bibasilar Abdomen: normal findings: soft, non-tender Wound: clean and dry  Lab Results: Recent Labs    07/14/20 0400 07/15/20 0423  WBC 16.2* 17.0*  HGB 10.4* 10.1*  HCT 32.4* 31.9*  PLT 164 230   BMET:  Recent Labs    07/14/20 0400 07/15/20 0423  NA 137 138  K 4.0 3.7  CL 102 104  CO2 28 24  GLUCOSE 114* 112*  BUN 15 12  CREATININE 0.93 0.92  CALCIUM 8.0* 8.0*    PT/INR: No results for input(s): LABPROT, INR in the last 72 hours. ABG    Component Value Date/Time   PHART 7.478 (H) 07/13/2020 1306   HCO3 31.6 (H) 07/13/2020 1306   TCO2 33 (H) 07/13/2020 1306   ACIDBASEDEF 6.0 (H) 07/08/2020 1619   O2SAT 61.8 07/15/2020 0423   CBG (last 3)  Recent Labs    07/15/20 0427 07/15/20 0429 07/15/20 0749  GLUCAP 58* 109* 106*    Assessment/Plan: S/P  Procedure(s) (LRB): EXPLORATION POST OPERATIVE OPEN HEART TAMPONADE (N/A) -Doing well CV- Impella on p3 with flow of 1.8 L/min. CO= 7 with CI of 4  On milrinone and levophed  Will plan to dc Impella today. Will also dc swan, follow co-ox RESP- continue IS ID- on Ceftaz for Burkholderia, will cover with Vanco for Impella removal RENAL- creatinine and lytes ok GI- advance diet after Impella removal Mobilize once Swan and Impella out   LOS: 20 days     C  07/15/2020  

## 2020-07-15 NOTE — H&P (View-Only) (Signed)
7 Days Post-Op Procedure(s) (LRB): EXPLORATION POST OPERATIVE OPEN HEART TAMPONADE (N/A) Subjective: Some incisional pain  Objective: Vital signs in last 24 hours: Temp:  [99 F (37.2 C)-100.2 F (37.9 C)] 99.1 F (37.3 C) (10/21 0832) Pulse Rate:  [54-112] 102 (10/21 0832) Cardiac Rhythm: Normal sinus rhythm;Sinus tachycardia (10/21 0400) Resp:  [16-33] 24 (10/21 0832) BP: (89-150)/(65-82) 146/80 (10/21 0800) SpO2:  [91 %-100 %] 99 % (10/21 0832) Arterial Line BP: (74-133)/(49-73) 125/73 (10/21 0832) Weight:  [73.3 kg] 73.3 kg (10/21 0500)  Hemodynamic parameters for last 24 hours: PAP: (18-39)/(3-19) 34/18 CVP:  [0 mmHg-9 mmHg] 8 mmHg PCWP:  [8 mmHg-18 mmHg] 18 mmHg CO:  [6.6 L/min-7.4 L/min] 7.4 L/min CI:  [3.6 L/min/m2-4 L/min/m2] 4 L/min/m2  Intake/Output from previous day: 10/20 0701 - 10/21 0700 In: 2935.3 [I.V.:2400.4; IV Piggyback:286.2] Out: 4055 [Urine:3675; Chest Tube:380] Intake/Output this shift: Total I/O In: 10.2 [Other:10.2] Out: -   General appearance: alert, cooperative and no distress Neurologic: intact Heart: regular rate and rhythm Lungs: diminished breath sounds bibasilar Abdomen: normal findings: soft, non-tender Wound: clean and dry  Lab Results: Recent Labs    07/14/20 0400 07/15/20 0423  WBC 16.2* 17.0*  HGB 10.4* 10.1*  HCT 32.4* 31.9*  PLT 164 230   BMET:  Recent Labs    07/14/20 0400 07/15/20 0423  NA 137 138  K 4.0 3.7  CL 102 104  CO2 28 24  GLUCOSE 114* 112*  BUN 15 12  CREATININE 0.93 0.92  CALCIUM 8.0* 8.0*    PT/INR: No results for input(s): LABPROT, INR in the last 72 hours. ABG    Component Value Date/Time   PHART 7.478 (H) 07/13/2020 1306   HCO3 31.6 (H) 07/13/2020 1306   TCO2 33 (H) 07/13/2020 1306   ACIDBASEDEF 6.0 (H) 07/08/2020 1619   O2SAT 61.8 07/15/2020 0423   CBG (last 3)  Recent Labs    07/15/20 0427 07/15/20 0429 07/15/20 0749  GLUCAP 58* 109* 106*    Assessment/Plan: S/P  Procedure(s) (LRB): EXPLORATION POST OPERATIVE OPEN HEART TAMPONADE (N/A) -Doing well CV- Impella on p3 with flow of 1.8 L/min. CO= 7 with CI of 4  On milrinone and levophed  Will plan to dc Impella today. Will also dc swan, follow co-ox RESP- continue IS ID- on Ceftaz for Burkholderia, will cover with Vanco for Impella removal RENAL- creatinine and lytes ok GI- advance diet after Impella removal Mobilize once Swan and Impella out   LOS: 20 days    Loreli Slot 07/15/2020

## 2020-07-15 NOTE — Anesthesia Preprocedure Evaluation (Signed)
Anesthesia Evaluation  Patient identified by MRN, date of birth, ID band Patient awake    Reviewed: Allergy & Precautions, NPO status , Patient's Chart, lab work & pertinent test results  Airway Mallampati: II  TM Distance: >3 FB Neck ROM: Full    Dental  (+) Teeth Intact, Dental Advisory Given   Pulmonary    + rhonchi  + decreased breath sounds      Cardiovascular  Rhythm:Regular Rate:Normal     Neuro/Psych    GI/Hepatic   Endo/Other    Renal/GU      Musculoskeletal   Abdominal   Peds  Hematology   Anesthesia Other Findings   Reproductive/Obstetrics                             Anesthesia Physical Anesthesia Plan  ASA: IV  Anesthesia Plan: General   Post-op Pain Management:    Induction: Intravenous  PONV Risk Score and Plan: Ondansetron and Dexamethasone  Airway Management Planned: Oral ETT  Additional Equipment: Arterial line and 3D TEE  Intra-op Plan:   Post-operative Plan: Extubation in OR  Informed Consent: I have reviewed the patients History and Physical, chart, labs and discussed the procedure including the risks, benefits and alternatives for the proposed anesthesia with the patient or authorized representative who has indicated his/her understanding and acceptance.     Dental advisory given  Plan Discussed with: Anesthesiologist and CRNA  Anesthesia Plan Comments:         Anesthesia Quick Evaluation

## 2020-07-15 NOTE — Progress Notes (Signed)
ANTICOAGULATION CONSULT NOTE - Follow Up Consult  Pharmacy Consult for heparin Indication: Impella  Labs: Recent Labs    07/13/20 0304 07/13/20 1054 07/13/20 1306 07/13/20 1554 07/14/20 0400 07/14/20 0400 07/14/20 1200 07/14/20 2004 07/15/20 0423  HGB 9.5*   < > 10.5*   < > 10.4*  --   --   --  10.1*  HCT 30.8*   < > 31.0*  --  32.4*  --   --   --  31.9*  PLT 92*  --   --   --  164  --   --   --  230  HEPARINUNFRC <0.10*  --   --    < > 0.13*   < > <0.10* <0.10* <0.10*  CREATININE 0.89  --   --   --  0.93  --   --   --  0.92   < > = values in this interval not displayed.    Assessment: 49yo male subtherapeutic heparin level < 0.1 on heparin systemic drip rate 800 uts/hr and Purge running at 10 ml/hr (~500 units/hr), total heparin drip 1300 uts/hr, prior to surgery patient required 1900 uts/hr to have therapeutic heparin level of > 0.3.   H/h stable, no overt bleeding noted, CT output less overnight - stable   No change today as plan for impella removal this afternoon  Goal of Therapy:  Heparin level 0.2-0.5 units/ml   Plan:  Will continue heparin drip to 800 units/hr   Daily HL, CBC  Follow up impella removal   Leota Sauers Pharm.D. CPP, BCPS Clinical Pharmacist [phone] 07/15/2020 8:49 AM

## 2020-07-15 NOTE — Anesthesia Postprocedure Evaluation (Signed)
Anesthesia Post Note  Patient: Jerry [name], site  Procedure(s) Performed: REMOVAL OF IMPELLA LEFT VENTRICULAR ASSIST DEVICE (N/A Chest)     Patient location during evaluation: SICU Anesthesia Type: General Level of consciousness: awake, oriented and patient cooperative Pain management: pain level controlled Respiratory status: spontaneous breathing, nonlabored ventilation and respiratory function stable Cardiovascular status: blood pressure returned to baseline Anesthetic complications: no   No complications documented.  Last Vitals:  Vitals:   07/15/20 1200 07/15/20 1300  BP: (!) 159/93 (!) 143/84  Pulse: 98 98  Resp: (!) 30 (!) 29  Temp: 37.6 C   SpO2: 99% 100%    Last Pain:  Vitals:   07/15/20 0800  TempSrc:   PainSc: 0-No pain                 , COKER

## 2020-07-15 NOTE — Progress Notes (Signed)
      [address]       [place] [address]             [phone]      Comfortable  BP (!) 143/84   Pulse 95   Temp 100 F (37.8 C)   Resp (!) 24   Ht 5\' 8"  (1.727 m)   Wt 73.3 kg   SpO2 98%   BMI 24.57 kg/m   Intake/Output Summary (Last 24 hours) at 07/15/2020 1920 Last data filed at 07/15/2020 1900 Gross per 24 hour  Intake 2361.57 ml  Output 2100 ml  Net 261.57 ml   Impella removed earlier today  Dc Swan and IJ 07/17/2020. [employer], MD [facility] [phone]

## 2020-07-15 NOTE — Interval H&P Note (Signed)
History and Physical Interval Note:  07/15/2020 10:54 AM  Jerry [name]  has presented today for surgery, with the diagnosis of HEART FAILURE.  The various methods of treatment have been discussed with the patient and family. After consideration of risks, benefits and other options for treatment, the patient has consented to  Procedure(s): REMOVAL OF IMPELLA LEFT VENTRICULAR ASSIST DEVICE (N/A) as a surgical intervention.  The patient's history has been reviewed, patient examined, no change in status, stable for surgery.  I have reviewed the patient's chart and labs.  Questions were answered to the patient's satisfaction.    He is aware of the indications, risks, and benefits and agrees to proceed Loreli Slot

## 2020-07-16 ENCOUNTER — Inpatient Hospital Stay (HOSPITAL_COMMUNITY): Payer: 59

## 2020-07-16 ENCOUNTER — Encounter (HOSPITAL_COMMUNITY): Payer: Self-pay | Admitting: Thoracic Surgery (Cardiothoracic Vascular Surgery)

## 2020-07-16 DIAGNOSIS — I5043 Acute on chronic combined systolic (congestive) and diastolic (congestive) heart failure: Secondary | ICD-10-CM | POA: Diagnosis not present

## 2020-07-16 LAB — BASIC METABOLIC PANEL
Anion gap: 10 (ref 5–15)
BUN: 12 mg/dL (ref 6–20)
CO2: 22 mmol/L (ref 22–32)
Calcium: 8.4 mg/dL — ABNORMAL LOW (ref 8.9–10.3)
Chloride: 105 mmol/L (ref 98–111)
Creatinine, Ser: 0.8 mg/dL (ref 0.61–1.24)
GFR, Estimated: >60 mL/min (ref >60)
Glucose, Bld: 115 mg/dL — ABNORMAL HIGH (ref 70–99)
Potassium: 4.1 mmol/L (ref 3.5–5.1)
Sodium: 137 mmol/L (ref 135–145)

## 2020-07-16 LAB — HEPARIN LEVEL (UNFRACTIONATED)
Heparin Unfractionated: <0.1 IU/mL — ABNORMAL LOW (ref 0.30–0.70)
Heparin Unfractionated: <0.1 IU/mL — ABNORMAL LOW (ref 0.30–0.70)

## 2020-07-16 LAB — CBC
HCT: 31.2 % — ABNORMAL LOW (ref 39.0–52.0)
Hemoglobin: 10.1 g/dL — ABNORMAL LOW (ref 13.0–17.0)
MCH: 29.1 pg (ref 26.0–34.0)
MCHC: 32.4 g/dL (ref 30.0–36.0)
MCV: 89.9 fL (ref 80.0–100.0)
Platelets: 283 10*3/uL (ref 150–400)
RBC: 3.47 MIL/uL — ABNORMAL LOW (ref 4.22–5.81)
RDW: 14 % (ref 11.5–15.5)
WBC: 16 10*3/uL — ABNORMAL HIGH (ref 4.0–10.5)
nRBC: 0 % (ref 0.0–0.2)

## 2020-07-16 LAB — GLUCOSE, CAPILLARY
Glucose-Capillary: 104 mg/dL — ABNORMAL HIGH (ref 70–99)
Glucose-Capillary: 118 mg/dL — ABNORMAL HIGH (ref 70–99)
Glucose-Capillary: 116 mg/dL — ABNORMAL HIGH (ref 70–99)
Glucose-Capillary: 134 mg/dL — ABNORMAL HIGH (ref 70–99)
Glucose-Capillary: 98 mg/dL (ref 70–99)

## 2020-07-16 LAB — DIGOXIN LEVEL: Digoxin Level: 0.3 ng/mL — ABNORMAL LOW (ref 1.0–2.0)

## 2020-07-16 LAB — COOXEMETRY PANEL
Carboxyhemoglobin: 1.3 % (ref 0.5–1.5)
Methemoglobin: 1 % (ref 0.0–1.5)
O2 Saturation: 69.8 %
Total hemoglobin: 10.3 g/dL — ABNORMAL LOW (ref 12.0–16.0)

## 2020-07-16 LAB — LACTATE DEHYDROGENASE: LDH: 324 U/L — ABNORMAL HIGH (ref 98–192)

## 2020-07-16 MED ORDER — TRAMADOL HCL 50 MG PO TABS
50.0000 mg | ORAL_TABLET | ORAL | Status: DC | PRN
  Administered 2020-07-16: 50 mg via ORAL
  Administered 2020-07-16: 100 mg via ORAL
  Filled 2020-07-16: qty 1
  Filled 2020-07-16: qty 2

## 2020-07-16 MED ORDER — CLONAZEPAM 0.25 MG PO TBDP: 0.5000 mg | ORAL_TABLET | Freq: Two times a day (BID) | ORAL | Status: DC | PRN

## 2020-07-16 MED ORDER — LOSARTAN POTASSIUM 25 MG PO TABS
12.5000 mg | ORAL_TABLET | Freq: Two times a day (BID) | ORAL | Status: DC
  Administered 2020-07-16 – 2020-07-17 (×3): 12.5 mg via ORAL
  Filled 2020-07-16 (×3): qty 1

## 2020-07-16 MED ORDER — DIGOXIN 125 MCG PO TABS
0.1250 mg | ORAL_TABLET | Freq: Every day | ORAL | Status: DC
  Administered 2020-07-16 – 2020-07-21 (×6): 0.125 mg via ORAL
  Filled 2020-07-16 (×6): qty 1

## 2020-07-16 MED ORDER — MILRINONE LACTATE IN DEXTROSE 20-5 MG/100ML-% IV SOLN
0.1250 ug/kg/min | INTRAVENOUS | Status: DC
  Administered 2020-07-16: 0.125 ug/kg/min via INTRAVENOUS
  Filled 2020-07-16: qty 100

## 2020-07-16 MED ORDER — SPIRONOLACTONE 12.5 MG HALF TABLET
12.5000 mg | ORAL_TABLET | Freq: Every day | ORAL | Status: DC
  Administered 2020-07-16 – 2020-07-18 (×3): 12.5 mg via ORAL
  Filled 2020-07-16 (×3): qty 1

## 2020-07-16 MED ORDER — ENSURE ENLIVE PO LIQD
237.0000 mL | Freq: Three times a day (TID) | ORAL | Status: DC
  Administered 2020-07-16 – 2020-07-21 (×5): 237 mL via ORAL

## 2020-07-16 NOTE — Progress Notes (Signed)
ANTICOAGULATION CONSULT NOTE - Follow Up Consult  Pharmacy Consult for Heparin Indication: LV thrombus?  Labs: Recent Labs    07/14/20 0400 07/14/20 1200 07/15/20 0423 07/15/20 0423 07/15/20 1344 07/15/20 1344 07/15/20 1347 07/16/20 0328 07/16/20 0728 07/16/20 1532  HGB 10.4*  --  10.1*   < > 10.9*   < > 10.9* 10.1*  --   --   HCT 32.4*  --  31.9*   < > 32.0*  --  32.0* 31.2*  --   --   PLT 164  --  230  --   --   --   --  283  --   --   HEPARINUNFRC 0.13*   < > <0.10*  --   --   --   --  <0.10*  --  <0.10*  CREATININE 0.93  --  0.92  --   --   --  0.70  --  0.80  --    < > = values in this interval not displayed.    Assessment: 49 YOM on Heparin for Impella - now removed and resumed post-op - noted concern earlier this admission for LV thrombus.   The patient was previously therapeutic on 1950 units/hr. Per discussion with Dr. Dorris Fetch - to keep the drip low and without a bolus for now.  Heparin drip rate 1400 uts/hr HL undetectable this afternoon  No bleeding, h/h stable   Goal of Therapy:  Heparin level 0.3-0.5 units/ml   Plan:  Increase heparin drip 1700 uts/hr  Check HL 6hr after rate increased Daily HL, CBC   Jeanella Cara, PharmD, [place] Clinical Pharmacist Please see AMION for all Pharmacists' Contact Phone Numbers 07/16/2020, 5:22 PM
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
## 2020-07-16 NOTE — Progress Notes (Addendum)
Patient ID: Jerry [name], male   DOB: 1971-05-27, 49 y.o.   MRN: [id number] P    Advanced Heart Failure Rounding Note  PCP-Cardiologist: Tonny Bollman, MD    Patient Profile   49 y/o male s/p recent back surgery, admitted for acute inferior and anterolateral MI. LHC showed thrombotic occlusion of distal RCA that was treated with PTCA and thrombectomy.  He was also noted to have chronic total occlusion of the LAD with collaterals from the right (thus this territory was affected by the RCA MI) as well as 90% complex proximal LCx stenosis. EF 25-30% + apical thrombus. RV normal. Course b/c cardiogenic shock and acute hypoxic respiratory failure. ? Component of septic shock.    Subjective:    - CABG + MV repair and placement of Impella 5.5 on 10/13.  - Back to OR on 10/14 with large hematoma obstructing right atrium.  - Extubated 10/19  - S/P Impella removal 10/21  Remains on milrinone 0.2 + amio drip + heparin. CO-OX 62%.   Denies SOB. Denies pain.   Objective:   Weight Range: 73.2 kg Body mass index is 24.54 kg/m.   Vital Signs:   Temp:  [99 F (37.2 C)-100 F (37.8 C)] 100 F (37.8 C) (10/21 1900) Pulse Rate:  [91-102] 93 (10/22 0700) Resp:  [12-34] 28 (10/22 0700) BP: (120-159)/(70-97) 132/73 (10/22 0600) SpO2:  [93 %-100 %] 98 % (10/22 0700) Arterial Line BP: (108-137)/(56-79) 119/63 (10/22 0700) Weight:  [73.2 kg] 73.2 kg (10/22 0500) Last BM Date: 07/08/20  Weight change: Filed Weights   07/14/20 0600 07/15/20 0500 07/16/20 0500  Weight: 77.2 kg 73.3 kg 73.2 kg    Intake/Output:   Intake/Output Summary (Last 24 hours) at 07/16/2020 4656 Last data filed at 07/16/2020 0700 Gross per 24 hour  Intake 1783.6 ml  Output 2310 ml  Net -526.4 ml      Physical Exam  General:   No resp difficulty HEENT: normal Neck: supple. no JVD. Carotids 2+ bilat; no bruits. No lymphadenopathy or thryomegaly appreciated. Cor: PMI nondisplaced. Regular rate & rhythm. No rubs,  gallops or murmurs. Sternal incision approximated.  Lungs: clear Abdomen: soft, nontender, nondistended. No hepatosplenomegaly. No bruits or masses. Good bowel sounds. Extremities: no cyanosis, clubbing, rash, edema. LUE forearm incision approximated. LUE PICC Neuro: alert & orientedx3, cranial nerves grossly intact. moves all 4 extremities w/o difficulty. Affect pleasant  Telemetry   NSR 90s with PVCs.   Labs    CBC Recent Labs    07/15/20 0423 07/15/20 1344 07/15/20 1347 07/16/20 0328  WBC 17.0*  --   --  16.0*  HGB 10.1*   < > 10.9* 10.1*  HCT 31.9*   < > 32.0* 31.2*  MCV 90.1  --   --  89.9  PLT 230  --   --  283   < > = values in this interval not displayed.   Basic Metabolic Panel Recent Labs    81/27/51 0400 07/14/20 0400 07/15/20 0423 07/15/20 0423 07/15/20 1344 07/15/20 1347  NA 137   < > 138   < > 139 139  K 4.0   < > 3.7   < > 4.0 3.9  CL 102   < > 104  --   --  102  CO2 28  --  24  --   --   --   GLUCOSE 114*   < > 112*  --   --  111*  BUN 15   < > 12  --   --  10  CREATININE 0.93   < > 0.92  --   --  0.70  CALCIUM 8.0*  --  8.0*  --   --   --   MG 2.1  --   --   --   --   --    < > = values in this interval not displayed.   Liver Function Tests Recent Labs    07/14/20 0400  AST 66*  ALT 368*  ALKPHOS 67  BILITOT 1.8*  PROT 5.2*  ALBUMIN 2.3*   No results for input(s): LIPASE, AMYLASE in the last 72 hours. Cardiac Enzymes No results for input(s): CKTOTAL, CKMB, CKMBINDEX, TROPONINI in the last 72 hours.  BNP: BNP (last 3 results) Recent Labs    06/25/20 2334 06/26/20 0150  BNP 349.1* 368.4*    ProBNP (last 3 results) No results for input(s): PROBNP in the last 8760 hours.   D-Dimer No results for input(s): DDIMER in the last 72 hours. Hemoglobin A1C No results for input(s): HGBA1C in the last 72 hours. Fasting Lipid Panel No results for input(s): CHOL, HDL, LDLCALC, TRIG, CHOLHDL, LDLDIRECT in the last 72 hours. Thyroid  Function Tests No results for input(s): TSH, T4TOTAL, T3FREE, THYROIDAB in the last 72 hours.  Invalid input(s): FREET3  Other results:   Imaging    No results found.   Medications:     Scheduled Medications: . aspirin EC  325 mg Oral Daily   Or  . aspirin  324 mg Per Tube Daily  . atorvastatin  80 mg Per Tube Daily  . bisacodyl  10 mg Oral Daily   Or  . bisacodyl  10 mg Rectal Daily  . chlorhexidine  15 mL Mouth Rinse BID  . Chlorhexidine Gluconate Cloth  6 each Topical Daily  . colchicine  0.6 mg Per Tube Daily  . digoxin  0.125 mg Per Tube Daily  . docusate  200 mg Per Tube Daily  . furosemide  40 mg Oral Daily  . insulin aspart  0-24 Units Subcutaneous Q4H  . mouth rinse  15 mL Mouth Rinse q12n4p  . pantoprazole  40 mg Oral Daily  . sodium chloride flush  10-40 mL Intracatheter Q12H  . sodium chloride flush  3 mL Intravenous Q12H  . traZODone  100 mg Per Tube QHS    Infusions: . sodium chloride 20 mL/hr at 07/12/20 1700  . sodium chloride Stopped (07/15/20 1304)  . sodium chloride Stopped (07/14/20 1900)  . albumin human 12.5 g (07/12/20 2148)  . amiodarone 30 mg/hr (07/16/20 0700)  . cefTAZidime (FORTAZ)  IV Stopped (07/16/20 0618)  . dexmedetomidine (PRECEDEX) IV infusion Stopped (07/14/20 1627)  . heparin 1,000 Units/hr (07/16/20 0700)  . lactated ringers 10 mL/hr at 07/15/20 1200  . milrinone 0.2 mcg/kg/min (07/16/20 0700)  . norepinephrine (LEVOPHED) Adult infusion Stopped (07/15/20 1820)    PRN Medications: sodium chloride, acetaminophen, albumin human, ALPRAZolam, clonazepam, metoprolol tartrate, ondansetron (ZOFRAN) IV, oxyCODONE, sodium chloride flush, sodium chloride flush, traMADol    Assessment/Plan   1. CAD: Late presentation inferior MI.  Cath showed thrombotic occlusion distal RCA, CTO LAD with collaterals from right, and complex 90% proximal LCx stenosis.  He had PTCA/thrombectomy RCA with good flow at end of procedure.  Suspect he had  damage to LAD territory as well as RCA territory as RCA collateralized the LAD.  Now s/p CABG with LIMA-LAD, left radial to OM, seq SVG-PDA/PLV - ASA/statin.  2. Post-infarct pericarditis: He has had persistent STE on  ECG and prominent pleuritic chest pain as well as a soft friction rub. Suspect post-MI pericarditis. Symptoms resolved prior to surgery.  Recurrent friction rub post-surgery.  - Continue colchicine 0.6 mg daily. .  3. Acute systolic CHF>>Cardiogenic Shock: Echo with EF 25-30%, RV ok (no evidence for RV infarct), LV thrombus.  Repeat bedside echo 06/27/20  EF 25%, normal RV w/ no pericardial effusion, no LV thrombus seen on this echo. Now with post-op shock, has Impella 5.5 in place.  Had to return to OR on 10/14 with large pericardial space hematoma obstructing the right atrium, hemodynamics much better since removal.  S/P Impella removal 10/21.  - Currently on milrinone 0.2 mcg.  CO-OX 70%. Will cut back to 0.125 mcg and likely stop tomorrow if CO-OX ok.  - Volume status stable. Continue Lasix today 40 mg daily.  Add 12.5 mg spironolactone daily.  3. AF with RVR: Noted 10/12, now in NSR on amiodarone.  - Continue amio drip while on milrinone.  - continue heparin gtt until he has swallow evaluations.  5. LV thrombus: Resolved on 10/3 echo.  - on heparin gtt.  6. ID: Initial concern for RLL PNA. Completed 7 days vancomycin/cefepime.  Steady fever post-op WBC 17.  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 Now on Fortaz.  7. Thrombocytopenia: Post-op. Now trending back up, 230K today.  8. S/p back surgery: Micro-discectomy just prior to admission.  Neurosurgery has seen.   9. Possible benzo/narcotic withdrawal: Concern GF brought him drugs pre-op.  10. Pericardial hematoma: Obstructed RA post-CABG, had to go back to OR on 10/14.   11. Anemia: Post-op blood loss.  - Transfuse to keep hgb > 8. Stable today.  12. Acute hypoxic respiratory failure.   - extubated 10/19  13.  Transaminitis:  Likely shock liver, LFTs trending down.    Swallow eval today. If ok can stop heparin and start eliquis.   Tonye Becket, NP-C  07/16/2020 7:12 AM   Patient seen with NP, agree with the above note.   Doing well today.  Off norepinephrine, remains on milrinone 0.2.  BP stable. Good UOP on po Lasix.   General: NAD Neck: No JVD, no thyromegaly or thyroid nodule.  Lungs: Clear to auscultation bilaterally with normal respiratory effort. CV: Nondisplaced PMI.  Heart regular S1/S2, no S3/S4, no murmur.  No peripheral edema.   Abdomen: Soft, nontender, no hepatosplenomegaly, no distention.  Skin: Intact without lesions or rashes.  Neurologic: Alert and oriented x 3.  Psych: Normal affect. Extremities: No clubbing or cyanosis.  HEENT: Normal.   He is now afebrile, continue ceftazidime for Burkholderia cepacia.   Co-ox 70% with stable volume.  - Decrease milrinone to 0.125.  - Continue po Lasix.  - Continue digoxin, level ok.  - Add spironolactone 12.5 daily and losartan 12.5 mg bid.  If tolerates losartan, to [employer].   Needs swallow evaluation.   Remains in NSR, continue amiodarone gtt while on milrinone.  Heparin gtt until passes swallow eval then Eliquis.   Can go to step down.   CRITICAL CARE Performed by: Marca Ancona  Total critical care time: 35 minutes  Critical care time was exclusive of separately billable procedures and treating other patients.  Critical care was necessary to treat or prevent imminent or life-threatening deterioration.  Critical care was time spent personally by me on the following activities: development of treatment plan with patient and/or surrogate as well as nursing, discussions with consultants, evaluation of patient's response to treatment, examination of  patient, obtaining history from patient or surrogate, ordering and performing treatments and interventions, ordering and review of laboratory studies, ordering and review of  radiographic studies, pulse oximetry and re-evaluation of patient's condition.  Marca AnconaDalton  07/16/2020 8:35 AM

## 2020-07-16 NOTE — Progress Notes (Signed)
Modified Barium Swallow Progress Note  Patient Details  Name: Jerry [name] MRN: [id number] Date of Birth: 21-Sep-1971  Today's Date: 07/16/2020  Modified Barium Swallow completed.  Full report located under Chart Review in the Imaging Section.  Brief recommendations include the following:  Clinical Impression  Pt presents with mild pharyngeal dysphagia characterized by a pharyngeal delay which resulted in aspiration (PAS 7) of thin liquids when pt swallowed the barium tablet. Aspiration did result in coughing but this was ineffective in expelling aspirated material. Prior and subsequent consecutive swallows were tolerated without penetration/aspiration. A regular texture diet with thin liquids will be initiated at this time. It is recommened that medication be administered whole with puree (i.e., applesauce, pudding, yogurt, etc). SLP will follow for dysphagia treatment.    Swallow Evaluation Recommendations       SLP Diet Recommendations: Regular solids;Thin liquid   Liquid Administration via: Cup;Straw   Medication Administration: Whole meds with puree   Supervision: Full assist for feeding   Compensations: Slow rate;Small sips/bites       Oral Care Recommendations: Oral care BID       I. Vear Clock, MS, CCC-SLP Acute Rehabilitation Services Office number [phone] Pager [phone]  Scheryl Marten 07/16/2020,1:45 PM

## 2020-07-16 NOTE — Progress Notes (Signed)
Patient ID: Jerry [name], male   DOB: [birth year], 49 y.o.   MRN: [id number] TCTS Evening Rounds:  Hemodynamically stable in sinus rhythm on milrinone 0.125 and amio drip.  Walked 2 laps today  Eating.

## 2020-07-16 NOTE — Evaluation (Addendum)
Physical Therapy Evaluation Patient Details Name: Jerry [name] MRN: [id number] DOB: Feb 02, 1971 Today's Date: 07/16/2020   History of Present Illness  HPI: 49 y/o male s/p recent back surgery, admitted 06/25/20 with chest pain found to have acute inferior STEMI. Also found to have acute distal RCA occlusion. Presented for CABG + MV repair adn placement of Impella 10/13. Back to OR 10/14 with large hematoma obstructing R atrium. Cadiogenic shock and acute hypoxic respiratory failure. Impella removed 10/21.Pt intubated 06/27/20-07/13/20.  Clinical Impression  Pt admitted with above diagnosis. Pt tolerated therapy well and ambulated 665 feet with Carley Hammed walker with min guard to min assist.  Pt needs cues for sternal precautions and went over handout with pt and girlfriend.  Anticipate good progress.   Pt currently with functional limitations due to the deficits listed below (see PT Problem List). Pt will benefit from skilled PT to increase their independence and safety with mobility to allow discharge to the venue listed below.      Follow Up Recommendations Home health PT;Supervision/Assistance - 24 hour    Equipment Recommendations  Other (comment) (TBA)    Recommendations for Other Services       Precautions / Restrictions Precautions Precautions: Sternal Precaution Booklet Issued: Yes (comment) Restrictions Weight Bearing Restrictions: No      Mobility  Bed Mobility Overal bed mobility: Needs Assistance Bed Mobility: Rolling;Sidelying to Sit Rolling: Min guard Sidelying to sit: Min assist       General bed mobility comments: cues and assist for technique    Transfers Overall transfer level: Needs assistance Equipment used:  Carley Hammed walker) Transfers: Sit to/from [employer] to Stand: Min guard;Min assist Stand pivot transfers: Min assist       General transfer comment:  a little assist to power up the first attempt.  Cues to place hands on knees. Pt  wanted to use bathroom therefore got 3N1 and pt able to have BM.  Cleaned pt with total assist.   Ambulation/Gait Ambulation/Gait assistance: Min guard;Min assist;+2 safety/equipment Gait Distance (Feet): 665 Feet Assistive device:  (Eva walker) Gait Pattern/deviations: Step-through pattern;Decreased stride length;Drifts right/left   Gait velocity interpretation: <1.31 ft/sec, indicative of household ambulator General Gait Details: Overall did well with Carley Hammed walker with difficulty initially steering it but got better as he walked. Pt sats on RA not registering therefore applied O2 and at 4L sats were 94%.  Walked pt on 4L but removed at end of walk. Pt needed cues to slow down and for upright posture.   Stairs            Wheelchair Mobility    Modified Rankin (Stroke Patients Only)       Balance Overall balance assessment: Needs assistance Sitting-balance support: No upper extremity supported;Feet supported Sitting balance-Leahy Scale: Fair Sitting balance - Comments: did assist pt with socks as he had recent back surgery.   Standing balance support: Bilateral upper extremity supported;During functional activity Standing balance-Leahy Scale: Poor Standing balance comment: relies on UE support and external support                             Pertinent Vitals/Pain Pain Assessment: 0-10 Pain Score: 7  Pain Location:  incision Pain Descriptors / Indicators: Discomfort Pain Intervention(s): Limited activity within patient's tolerance;Monitored during session;Repositioned;Patient requesting pain meds-RN notified    Home Living Family/patient expects to be discharged to:: Private residence Living Arrangements: Spouse/significant other Available Help at Discharge: Family;Available 24  hours/day (girlfriend Baxter Hire and mom Junious Dresser) Type of Home: House Home Access: Stairs to enter Entrance Stairs-Rails: None Entrance Stairs-Number of Steps: 1 Home Layout: One  level Home Equipment: None;Crutches      Prior Function Level of Independence: Independent         Comments: operates heavy equipment machinery; rides motorcycles; plays drums     Hand Dominance   Dominant Hand: Right    Extremity/Trunk Assessment   Upper Extremity Assessment Upper Extremity Assessment: Defer to OT evaluation RUE Deficits / Details: RUE weakness; Pt with prior hand/wrist injury; limited wrist movement/ext at baseline however, R hand/wrist weaker. unable to complete composite wrist extension with digit extension, lag of @ 30 degrees, especially with 4th/5th digits; gross/grasp/release with @ 3+/5 grip strength; able to oppose fingers to thum. pinch strength @ 3/5; unablet o lift digits off tabel; with attempts of use, pt flexes R wrist when making fist; difficulty touching hand to mouth but able to do so; scapula/humeral abnormal rthym with decreased scapular glide; able to achieve @ 60 degrees AROM FF; substitutes with hiking shoulder RUE Coordination: decreased fine motor;decreased gross motor    Lower Extremity Assessment Lower Extremity Assessment: Generalized weakness    Cervical / Trunk Assessment Cervical / Trunk Assessment: Other exceptions (recent back surgery; limited cervical movement)  Communication   Communication: Other (comment) (speaks in whisper)  Cognition Arousal/Alertness: Awake/alert Behavior During Therapy: WFL for tasks assessed/performed;Impulsive Overall Cognitive Status: Impaired/Different from baseline Area of Impairment: Attention;Safety/judgement;Awareness                   Current Attention Level: Selective     Safety/Judgement: Decreased awareness of deficits;Decreased awareness of safety Awareness: Emergent   General Comments: will further assess; able to recount events during hospitalization      General Comments      Exercises Other Exercises Other Exercises: Educated on shoulder rolls - ant/post x 10;  3x/day Other Exercises: gentle scapular retraction x 10 Other Exercises:  towel slides x 10 into FF x 10 withelbow close to body Other Exercises: yellow theraputty - focus on grip and pinch strengthening Other Exercises: squeeze ball x 10   Assessment/Plan    PT Assessment Patient needs continued PT services  PT Problem List Decreased activity tolerance;Decreased balance;Decreased mobility;Decreased knowledge of use of DME;Decreased safety awareness;Decreased knowledge of precautions;Cardiopulmonary status limiting activity;Pain       PT Treatment Interventions DME instruction;Gait training;Functional mobility training;Therapeutic activities;Therapeutic exercise;Balance training;Patient/family education;Stair training    PT Goals (Current goals can be found in the Care Plan section)  Acute Rehab PT Goals Patient Stated Goal: go home PT Goal Formulation: With patient Time For Goal Achievement: 07/30/20 Potential to Achieve Goals: Good    Frequency Min 3X/week   Barriers to discharge        Co-evaluation               AM-PAC PT "6 Clicks" Mobility  Outcome Measure Help needed turning from your back to your side while in a flat bed without using bedrails?: A Little Help needed moving from lying on your back to sitting on the side of a flat bed without using bedrails?: A Little Help needed moving to and from a bed to a chair (including a wheelchair)?: A Little Help needed standing up from a chair using your arms (e.g., wheelchair or bedside chair)?: A Little Help needed to walk in hospital room?: A Little Help needed climbing 3-5 steps with a railing? : A Little 6  Click Score: 18    End of Session Equipment Utilized During Treatment: Gait belt;Oxygen Activity Tolerance: Patient limited by fatigue Patient left: in chair;with call bell/phone within reach;with chair alarm set;with family/visitor present (girlfriend) Nurse Communication: Mobility status;Precautions PT Visit  Diagnosis: Muscle weakness (generalized) (M62.81);Pain Pain - part of body:  (incision)    Time: 5825-1898 PT Time Calculation (min) (ACUTE ONLY): 58 min   Charges:   PT Evaluation $PT Eval Moderate Complexity: 1 Mod PT Treatments $Gait Training: 23-37 mins $Self Care/Home Management: 8-22         W,PT Acute Rehabilitation Services Pager:  [phone]  Office:  [phone]    Berline Lopes 07/16/2020, 5:40 PM

## 2020-07-16 NOTE — Progress Notes (Signed)
  Speech Language Pathology Treatment: Dysphagia  Patient Details Name: Jerry [name] MRN: [id number] DOB: [birth year] Today's Date: 07/16/2020 Time: 6433-2951 SLP Time Calculation (min) (ACUTE ONLY): 20 min  Assessment / Plan / Recommendation Clinical Impression  Pt was seen for dysphagia treatment with his significant other present. Pt and was cooperative and reported that he has been consuming floor stock purees and thin liquids with inconsistent signs of aspiration with thin liquids if he does not focus and "slow down" his swallow. Vocal quality remains dysphonic with intermittent voicing. Pt's significant other reported that it is improved and that his voice typically takes ~3 days to return to baseline. 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 He tolerated dysphagia 3 and regular texture solids without overt s/sx of aspiration. Coughing was inconsistently noted with thin liquids via straw. Considering length of intubation, signs of aspiration with thin liquids, and continued dysphonia, instrumental assessment is recommended. Fiberoptic endoscopic evaluation of swallowing (FEES) was recommended since this would also allow assessment of vocal fold function; however, pt was very hesitant regarding this and ultimately expressed a preference to have a modified barium swallow study completed. It is currently scheduled for 1:00 p.m. pending agreement with referring MD.     HPI HPI: 49 y/o male s/p recent back surgery, admitted 06/25/20 with chest pain found to have acute inferior STEMI. Also found to have acute distal RCA occlusion. Presented for CABG on 07/07/20 with diagnosis of CAD. Pt intubated 06/27/20-07/13/20.      SLP Plan  MBS       Recommendations  Medication Administration: Whole meds with puree Compensations: Slow rate;Small sips/bites                Oral Care Recommendations: Oral care QID Follow up Recommendations:   (TBD) SLP Visit Diagnosis: Dysphagia, unspecified (R13.10) Plan: MBS        I. Vear Clock, MS, CCC-SLP Acute Rehabilitation Services Office number [phone] Pager [phone]                Scheryl Marten 07/16/2020, 9:23 AM

## 2020-07-16 NOTE — Op Note (Signed)
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  PROCEDURE:  Removal of Impella left ventricular assist device.  SURGEON:  Charlett Lango, MD  ASSISTANT:  None.  ANESTHESIA:  General.  FINDINGS:  No change in hemodynamics with removal of Impella pump.  Ejection fraction 30% to 35% by transesophageal echocardiography.  CLINICAL NOTE:  Jerry [name] is a 49 year old man who presented with an acute MI.  He presented late in the course of the MI with shock and left ventricular failure.  His MI was also complicated by pericarditis.  He was managed medically and then  underwent coronary artery bypass grafting and mitral valve repair.  He had placement of an Impella 5.5 left ventricular assist device via a graft sewn to the axillary artery at the time of his open heart surgery.  He now has improved clinically and is  ready for removal of the device.  The indications, risks, benefits, and alternatives were discussed in detail with the patient.  He understood and accepted the risks and agreed to proceed.  DESCRIPTION OF PROCEDURE:  Jerry [name] was brought to the operating room on 07/15/2020.  He had induction of general anesthesia.  He was already receiving intravenous ceftazidime.  He was also given intravenous vancomycin.  The chest was prepped and  draped in the usual sterile fashion.  The incision in the right deltopectoral groove was reopened.  The staples were removed.  The sutures were removed.  There was some clot around the graft, which was irrigated and removed.  The sutures securing the  Impella then were cut.  The Impella was pulled back into  the subclavian artery and then flow was stopped.  The catheter was removed and the graft was divided close to its insertion to the axillary artery using a Covidien stapler.  There was good  hemostasis at the graft.  The patient was observed clinically and there were no significant hemodynamic change with removal of the device.  The wound was copiously irrigated with warm saline.  A final inspection was made for hemostasis.  The wound then  was closed in standard fashion.  The patient then was extubated in the operating room and observed before returning to the Surgical Intensive Care Unit.  All sponge, needle and instrument counts were correct at the end of the procedure.  VN/NUANCE  D:07/16/2020 T:07/16/2020 JOB:[id number]

## 2020-07-16 NOTE — Progress Notes (Signed)
ANTICOAGULATION CONSULT NOTE - Follow Up Consult  Pharmacy Consult for Heparin Indication: LV thrombus?  Labs: Recent Labs    07/14/20 0400 07/14/20 0400 07/14/20 1200 07/14/20 2004 07/15/20 0423 07/15/20 0423 07/15/20 1344 07/15/20 1344 07/15/20 1347 07/16/20 0328 07/16/20 0728  HGB 10.4*   < >  --   --  10.1*   < > 10.9*   < > 10.9* 10.1*  --   HCT 32.4*   < >  --   --  31.9*   < > 32.0*  --  32.0* 31.2*  --   PLT 164  --   --   --  230  --   --   --   --  283  --   HEPARINUNFRC 0.13*  --    < > <0.10* <0.10*  --   --   --   --  <0.10*  --   CREATININE 0.93   < >  --   --  0.92  --   --   --  0.70  --  0.80   < > = values in this interval not displayed.    Assessment: 49 YOM on Heparin for Impella - now removed and resumed post-op - noted concern earlier this admission for LV thrombus.   The patient was previously therapeutic on 1950 units/hr. Per discussion with Dr. Dorris Fetch - to keep the drip low and without a bolus for now.  Heparin drip rate 1000 uts/hr HL undetectable this morning  No bleeding, h/h stable   Goal of Therapy:  Heparin level 0.3-0.5 units/ml   Plan:  Increase heparin drip 1400 uts/hr  Check HL 6hr after rate increased Daily HL, CBC   Leota Sauers Pharm.D. CPP, BCPS Clinical Pharmacist [phone] 07/16/2020 3:56 PM    **Pharmacist phone directory can now be found on [email or web address] ([email or web address]).  Listed under [facility].

## 2020-07-16 NOTE — Progress Notes (Signed)
Occupational Therapy Treatment RUE HEP initiated as noted below.   07/16/20 1700  OT Visit Information  Last OT Received On 07/16/20  Assistance Needed +1  History of Present Illness HPI: 49 y/o male s/p recent back surgery, admitted 06/25/20 with chest pain found to have acute inferior STEMI. Also found to have acute distal RCA occlusion. Presented for CABG + MV repair adn placement of Impella 10/13. Back to OR 10/14 with large hematoma obstructing R atrium. Cadiogenic shock and acute hypoxic respiratory failure. Impella removed 10/21.Pt intubated 06/27/20-07/13/20.  Precautions  Precautions Sternal  Pain Assessment  Pain Assessment 0-10  Pain Score 5  Pain Location "everywhere"  Pain Descriptors / Indicators Discomfort  Pain Intervention(s) Limited activity within patient's tolerance  Cognition  Arousal/Alertness Awake/alert  Behavior During Therapy WFL for tasks assessed/performed;Impulsive  Overall Cognitive Status Impaired/Different from baseline  Area of Impairment Attention;Safety/judgement;Awareness  Current Attention Level Selective  Safety/Judgement Decreased awareness of deficits;Decreased awareness of safety  Awareness Emergent  General Comments will further assess; able to recount events during hospitalization  Exercises  Exercises Other exercises  Other Exercises  Other Exercises Educated on shoulder rolls - ant/post x 10; 3x/day  Other Exercises gentle scapular retraction x 10  Other Exercises  towel slides x 10 into FF x 10 withelbow close to body  Other Exercises yellow theraputty - focus on grip and pinch strengthening  Other Exercises squeeze ball x 10  OT - End of Session  Activity Tolerance Patient tolerated treatment well  Patient left in chair;with call bell/phone within reach;with family/visitor present  Nurse Communication Mobility status;Other (comment) (encourage RUE exercise)  OT Assessment/Plan  OT Plan Discharge plan remains appropriate  OT Visit  Diagnosis Unsteadiness on feet (R26.81);Muscle weakness (generalized) (M62.81);Other symptoms and signs involving cognitive function;Pain  Pain - part of body  (everywhere)  OT Frequency (ACUTE ONLY) Min 2X/week  Follow Up Recommendations Outpatient OT;Supervision/Assistance - 24 hour  OT Equipment 3 in 1 bedside commode  AM-PAC OT "6 Clicks" Daily Activity Outcome Measure (Version 2)  Help from another person eating meals? 3  Help from another person taking care of personal grooming? 2  Help from another person toileting, which includes using toliet, bedpan, or urinal? 2  Help from another person bathing (including washing, rinsing, drying)? 2  Help from another person to put on and taking off regular upper body clothing? 2  Help from another person to put on and taking off regular lower body clothing? 2  6 Click Score 13  OT Goal Progression  Progress towards OT goals Progressing toward goals  Acute Rehab OT Goals  Patient Stated Goal to get better adn go home  OT Goal Formulation With patient  Time For Goal Achievement 07/30/20  Potential to Achieve Goals Good  OT Time Calculation  OT Start Time (ACUTE ONLY) 1616  OT Stop Time (ACUTE ONLY) 1631  OT Time Calculation (min) 15 min  OT General Charges  $OT Visit 1 Visit  OT Treatments  $Therapeutic Exercise 8-22 mins  Luisa Dago, OT/L   Acute OT Clinical Specialist Acute Rehabilitation Services Pager [phone] Office [phone]

## 2020-07-17 DIAGNOSIS — I5043 Acute on chronic combined systolic (congestive) and diastolic (congestive) heart failure: Secondary | ICD-10-CM | POA: Diagnosis not present

## 2020-07-17 LAB — CBC
HCT: 31 % — ABNORMAL LOW (ref 39.0–52.0)
Hemoglobin: 9.9 g/dL — ABNORMAL LOW (ref 13.0–17.0)
MCH: 28.9 pg (ref 26.0–34.0)
MCHC: 31.9 g/dL (ref 30.0–36.0)
MCV: 90.6 fL (ref 80.0–100.0)
Platelets: 328 10*3/uL (ref 150–400)
RBC: 3.42 MIL/uL — ABNORMAL LOW (ref 4.22–5.81)
RDW: 14.2 % (ref 11.5–15.5)
WBC: 15.8 10*3/uL — ABNORMAL HIGH (ref 4.0–10.5)
nRBC: 0 % (ref 0.0–0.2)

## 2020-07-17 LAB — BASIC METABOLIC PANEL
Anion gap: 9 (ref 5–15)
BUN: 14 mg/dL (ref 6–20)
CO2: 21 mmol/L — ABNORMAL LOW (ref 22–32)
Calcium: 8.3 mg/dL — ABNORMAL LOW (ref 8.9–10.3)
Chloride: 110 mmol/L (ref 98–111)
Creatinine, Ser: 0.92 mg/dL (ref 0.61–1.24)
GFR, Estimated: >60 mL/min (ref >60)
Glucose, Bld: 103 mg/dL — ABNORMAL HIGH (ref 70–99)
Potassium: 3.9 mmol/L (ref 3.5–5.1)
Sodium: 140 mmol/L (ref 135–145)

## 2020-07-17 LAB — COOXEMETRY PANEL
Carboxyhemoglobin: 1.6 % — ABNORMAL HIGH (ref 0.5–1.5)
Methemoglobin: 0.6 % (ref 0.0–1.5)
O2 Saturation: 83.1 %
Total hemoglobin: 8.6 g/dL — ABNORMAL LOW (ref 12.0–16.0)

## 2020-07-17 LAB — ECHO INTRAOPERATIVE TEE
Height: 68 in
Weight: 2582.03 oz

## 2020-07-17 LAB — GLUCOSE, CAPILLARY
Glucose-Capillary: 108 mg/dL — ABNORMAL HIGH (ref 70–99)
Glucose-Capillary: 124 mg/dL — ABNORMAL HIGH (ref 70–99)
Glucose-Capillary: 134 mg/dL — ABNORMAL HIGH (ref 70–99)

## 2020-07-17 LAB — HEPARIN LEVEL (UNFRACTIONATED): Heparin Unfractionated: 0.4 IU/mL (ref 0.30–0.70)

## 2020-07-17 MED ORDER — SODIUM CHLORIDE 0.9% FLUSH: 3.0000 mL | INTRAVENOUS | Status: DC | PRN

## 2020-07-17 MED ORDER — WARFARIN SODIUM 5 MG PO TABS
5.0000 mg | ORAL_TABLET | Freq: Once | ORAL | Status: AC
  Administered 2020-07-17: 5 mg via ORAL
  Filled 2020-07-17: qty 1

## 2020-07-17 MED ORDER — ONDANSETRON HCL 4 MG PO TABS: 4.0000 mg | ORAL_TABLET | Freq: Four times a day (QID) | ORAL | Status: DC | PRN

## 2020-07-17 MED ORDER — TRAZODONE HCL 100 MG PO TABS
100.0000 mg | ORAL_TABLET | Freq: Every day | ORAL | Status: DC
  Administered 2020-07-17 – 2020-07-20 (×4): 100 mg via ORAL
  Filled 2020-07-17 (×4): qty 1

## 2020-07-17 MED ORDER — TRAMADOL HCL 50 MG PO TABS: 50.0000 mg | ORAL_TABLET | Freq: Four times a day (QID) | ORAL | Status: DC | PRN

## 2020-07-17 MED ORDER — PANTOPRAZOLE SODIUM 40 MG PO TBEC
40.0000 mg | DELAYED_RELEASE_TABLET | Freq: Every day | ORAL | Status: DC
  Administered 2020-07-18 – 2020-07-21 (×4): 40 mg via ORAL
  Filled 2020-07-17 (×4): qty 1

## 2020-07-17 MED ORDER — ONDANSETRON HCL 4 MG/2ML IJ SOLN: 4.0000 mg | Freq: Four times a day (QID) | INTRAMUSCULAR | Status: DC | PRN

## 2020-07-17 MED ORDER — WARFARIN - PHARMACIST DOSING INPATIENT: Freq: Every day | Status: DC

## 2020-07-17 MED ORDER — ~~LOC~~ CARDIAC SURGERY, PATIENT & FAMILY EDUCATION: Freq: Once | Status: DC

## 2020-07-17 MED ORDER — SODIUM CHLORIDE 0.9% FLUSH
3.0000 mL | Freq: Two times a day (BID) | INTRAVENOUS | Status: DC
  Administered 2020-07-18 – 2020-07-21 (×6): 3 mL via INTRAVENOUS

## 2020-07-17 MED ORDER — CHLORHEXIDINE GLUCONATE CLOTH 2 % EX PADS
6.0000 | MEDICATED_PAD | Freq: Every day | CUTANEOUS | Status: DC
  Administered 2020-07-17 – 2020-07-20 (×4): 6 via TOPICAL

## 2020-07-17 MED ORDER — ASPIRIN EC 325 MG PO TBEC
325.0000 mg | DELAYED_RELEASE_TABLET | Freq: Every day | ORAL | Status: DC
  Administered 2020-07-18 – 2020-07-20 (×3): 325 mg via ORAL
  Filled 2020-07-17 (×3): qty 1

## 2020-07-17 MED ORDER — AMIODARONE HCL 200 MG PO TABS
200.0000 mg | ORAL_TABLET | Freq: Two times a day (BID) | ORAL | Status: DC
  Administered 2020-07-17 – 2020-07-21 (×9): 200 mg via ORAL
  Filled 2020-07-17 (×9): qty 1

## 2020-07-17 MED ORDER — COLCHICINE 0.6 MG PO TABS
0.6000 mg | ORAL_TABLET | Freq: Every day | ORAL | Status: DC
  Administered 2020-07-18 – 2020-07-21 (×4): 0.6 mg via ORAL
  Filled 2020-07-17 (×4): qty 1

## 2020-07-17 MED ORDER — SACUBITRIL-VALSARTAN 24-26 MG PO TABS
1.0000 | ORAL_TABLET | Freq: Two times a day (BID) | ORAL | Status: DC
  Administered 2020-07-17 – 2020-07-21 (×8): 1 via ORAL
  Filled 2020-07-17 (×10): qty 1

## 2020-07-17 MED ORDER — SODIUM CHLORIDE 0.9 % IV SOLN: 250.0000 mL | INTRAVENOUS | Status: DC | PRN

## 2020-07-17 MED ORDER — ACETAMINOPHEN 325 MG PO TABS
650.0000 mg | ORAL_TABLET | Freq: Four times a day (QID) | ORAL | Status: DC | PRN
  Administered 2020-07-17 – 2020-07-19 (×2): 650 mg via ORAL
  Filled 2020-07-17 (×2): qty 2

## 2020-07-17 NOTE — Progress Notes (Signed)
2 Days Post-Op Procedure(s) (LRB): REMOVAL OF IMPELLA LEFT VENTRICULAR ASSIST DEVICE (N/A) Subjective:  No complaints. Ambulating well, bowels moving.  Objective: Vital signs in last 24 hours: Temp:  [97.4 F (36.3 C)-98.4 F (36.9 C)] 97.6 F (36.4 C) (10/23 1158) Pulse Rate:  [83-96] 96 (10/23 1200) Cardiac Rhythm: Normal sinus rhythm (10/23 1200) Resp:  [16-30] 27 (10/23 1200) BP: (84-171)/(61-97) 105/74 (10/23 1200) SpO2:  [86 %-97 %] 95 % (10/23 1200) Weight:  [71.4 kg] 71.4 kg (10/23 0600)  Hemodynamic parameters for last 24 hours:    Intake/Output from previous day: 10/22 0701 - 10/23 0700 In: 1216.8 [I.V.:924.4; IV Piggyback:292.4] Out: 2175 [Urine:2175] Intake/Output this shift: Total I/O In: 110 [I.V.:110] Out: -   General appearance: alert and cooperative Neurologic: intact Heart: regular rate and rhythm, S1, S2 normal, no murmur Lungs: clear to auscultation bilaterally Extremities: extremities normal, atraumatic, no cyanosis or edema Wound: incision looks good.  Lab Results: Recent Labs    07/16/20 0328 07/17/20 0132  WBC 16.0* 15.8*  HGB 10.1* 9.9*  HCT 31.2* 31.0*  PLT 283 328   BMET:  Recent Labs    07/16/20 0728 07/17/20 0132  NA 137 140  K 4.1 3.9  CL 105 110  CO2 22 21*  GLUCOSE 115* 103*  BUN 12 14  CREATININE 0.80 0.92  CALCIUM 8.4* 8.3*    PT/INR: No results for input(s): LABPROT, INR in the last 72 hours. ABG    Component Value Date/Time   PHART 7.378 07/15/2020 1344   HCO3 27.7 07/15/2020 1344   TCO2 26 07/15/2020 1347   ACIDBASEDEF 6.0 (H) 07/08/2020 1619   O2SAT 83.1 07/17/2020 0555   CBG (last 3)  Recent Labs    07/17/20 0000 07/17/20 0739 07/17/20 1121  GLUCAP 108* 134* 124*    Assessment/Plan:  Hemodynamically stable in sinus rhythm. Milrinone stopped this am with Co-ox 83%.  Amiodarone switched to po.  Heparin stopped and will remove pacing wires after 4 hrs. Coumadin to start tonight for hx of LV  thrombus. I don't think we need to restart heparin since LV thrombus resolved on last echo. This will minimize risk of bleeding.  Transfer to 4E and continue mobilization.     LOS: 22 days    Alleen Borne 07/17/2020

## 2020-07-17 NOTE — Progress Notes (Signed)
Epicardial wires pulled per order. Tips intact. Patient tolerated well. Patient educated on bedrest x1 hour. VS monitoring per 15 minutes per protocol. Site clean dry intact.  Kenard Gower, RN

## 2020-07-17 NOTE — Progress Notes (Signed)
ANTICOAGULATION CONSULT NOTE - Follow Up Consult  Pharmacy Consult for Heparin Indication: LV thrombus?  Labs: Recent Labs    07/15/20 0423 07/15/20 0423 07/15/20 1344 07/15/20 1347 07/16/20 0328 07/16/20 0728 07/16/20 1532 07/17/20 0132  HGB 10.1*   < >   < > 10.9* 10.1*  --   --  9.9*  HCT 31.9*  --    < > 32.0* 31.2*  --   --  31.0*  PLT 230  --   --   --  283  --   --  328  HEPARINUNFRC <0.10*   < >  --   --  <0.10*  --  <0.10* 0.40  CREATININE 0.92  --   --  0.70  --  0.80  --   --    < > = values in this interval not displayed.    Assessment: 49 YOM on Heparin for Impella - now removed and resumed post-op - noted concern earlier this admission for LV thrombus.   The patient was previously therapeutic on 1950 units/hr. Per discussion with Dr. Dorris Fetch - to keep the drip low and without a bolus for now.  Heparin drip rate 1400 uts/hr HL undetectable this afternoon  No bleeding, h/h stable   10/23 AM update:  Heparin level therapeutic after rate increase  Goal of Therapy:  Heparin level 0.3-0.5 units/ml   Plan:  Cont heparin 1700 units/hr 1000 heparin level  Abran Duke, PharmD, BCPS Clinical Pharmacist Phone: [phone]

## 2020-07-17 NOTE — Progress Notes (Signed)
CARDIAC REHAB PHASE I   PRE:  Rate/Rhythm: 95 SR    BP: sitting 106/75    SaO2: 97 RA  MODE:  Ambulation: 420 ft   POST:  Rate/Rhythm: 104 ST    BP: sitting 104/75     SaO2: 99 RA  Pt moving fairly well, needs reminders for sternal precautions. Ambulated without RW, assist x1 for steadying. LOB x1 when he turned around quickly. Pt without c/o, feels well. To bed to have EPW pulled. VSS. Encouraged IS and sternal precautions and ambulation. [phone]   Harriet Masson CES, ACSM 07/17/2020 3:17 PM

## 2020-07-17 NOTE — Progress Notes (Signed)
ANTICOAGULATION CONSULT NOTE - Follow Up Consult  Pharmacy Consult for Heparin Indication: LV thrombus / AFib  Labs: Recent Labs    07/15/20 0423 07/15/20 0423 07/15/20 1344 07/15/20 1347 07/16/20 0328 07/16/20 0728 07/16/20 1532 07/17/20 0132  HGB 10.1*   < >   < > 10.9* 10.1*  --   --  9.9*  HCT 31.9*  --    < > 32.0* 31.2*  --   --  31.0*  PLT 230  --   --   --  283  --   --  328  HEPARINUNFRC <0.10*   < >  --   --  <0.10*  --  <0.10* 0.40  CREATININE 0.92  --   --  0.70  --  0.80  --  0.92   < > = values in this interval not displayed.    Assessment: 49 YOM on Heparin for Impella - now removed and resumed post-op - noted concern earlier this admission for LV thrombus. Pt has passed swallow so will begin warfarin load.  Goal of Therapy:  Heparin level 0.3-0.7 units/ml   Plan:  Cont heparin 1700 units/hr Warfarin 5mg  PO x1 tonight Daily INR, heparin level, CBC   , PharmD, BCPS Clinical Pharmacist [phone] Please check AMION for all [facility] numbers 07/17/2020

## 2020-07-17 NOTE — Progress Notes (Signed)
Patient ID: Jerry [name], male   DOB: [birth year], 49 y.o.   MRN: [id number] P    Advanced Heart Failure Rounding Note  PCP-Cardiologist: Tonny Bollman, MD    Patient Profile   49 y/o male s/p recent back surgery, admitted for acute inferior and anterolateral MI. LHC showed thrombotic occlusion of distal RCA that was treated with PTCA and thrombectomy.  He was also noted to have chronic total occlusion of the LAD with collaterals from the right (thus this territory was affected by the RCA MI) as well as 90% complex proximal LCx stenosis. EF 25-30% + apical thrombus. RV normal. Course b/c cardiogenic shock and acute hypoxic respiratory failure. ? Component of septic shock.    Subjective:    - CABG + MV repair and placement of Impella 5.5 on 10/13.  - Back to OR on 10/14 with large hematoma obstructing right atrium.  - Extubated 10/19  - S/P Impella removal 10/21  Remains on milrinone 0.125 + amio drip + heparin. CO-OX 83%.   Denies SOB. Denies pain. Walked in hall already today.   Objective:   Weight Range: 71.4 kg Body mass index is 23.93 kg/m.   Vital Signs:   Temp:  [97.4 F (36.3 C)-98.4 F (36.9 C)] 97.4 F (36.3 C) (10/23 0802) Pulse Rate:  [83-98] 95 (10/23 0700) Resp:  [16-30] 23 (10/23 0700) BP: (111-171)/(61-97) 119/67 (10/23 0600) SpO2:  [86 %-97 %] 95 % (10/23 0700) Weight:  [71.4 kg] 71.4 kg (10/23 0600) Last BM Date: 07/08/20  Weight change: Filed Weights   07/15/20 0500 07/16/20 0500 07/17/20 0600  Weight: 73.3 kg 73.2 kg 71.4 kg    Intake/Output:   Intake/Output Summary (Last 24 hours) at 07/17/2020 0814 Last data filed at 07/17/2020 0700 Gross per 24 hour  Intake 1186.39 ml  Output 2025 ml  Net -838.61 ml      Physical Exam   General: NAD Neck: No JVD, no thyromegaly or thyroid nodule.  Lungs: Clear to auscultation bilaterally with normal respiratory effort. CV: Nondisplaced PMI.  Heart regular S1/S2, no S3/S4, no murmur.  No peripheral  edema.   Abdomen: Soft, nontender, no hepatosplenomegaly, no distention.  Skin: Intact without lesions or rashes.  Neurologic: Alert and oriented x 3.  Psych: Normal affect. Extremities: No clubbing or cyanosis.  HEENT: Normal.   Telemetry   NSR 90s (personally reviewed).   Labs    CBC Recent Labs    07/16/20 0328 07/17/20 0132  WBC 16.0* 15.8*  HGB 10.1* 9.9*  HCT 31.2* 31.0*  MCV 89.9 90.6  PLT 283 328   Basic Metabolic Panel Recent Labs    78/29/56 0728 07/17/20 0132  NA 137 140  K 4.1 3.9  CL 105 110  CO2 22 21*  GLUCOSE 115* 103*  BUN 12 14  CREATININE 0.80 0.92  CALCIUM 8.4* 8.3*   Liver Function Tests No results for input(s): AST, ALT, ALKPHOS, BILITOT, PROT, ALBUMIN in the last 72 hours. No results for input(s): LIPASE, AMYLASE in the last 72 hours. Cardiac Enzymes No results for input(s): CKTOTAL, CKMB, CKMBINDEX, TROPONINI in the last 72 hours.  BNP: BNP (last 3 results) Recent Labs    06/25/20 2334 06/26/20 0150  BNP 349.1* 368.4*    ProBNP (last 3 results) No results for input(s): PROBNP in the last 8760 hours.   D-Dimer No results for input(s): DDIMER in the last 72 hours. Hemoglobin A1C No results for input(s): HGBA1C in the last 72 hours. Fasting Lipid Panel No results  for input(s): CHOL, HDL, LDLCALC, TRIG, CHOLHDL, LDLDIRECT in the last 72 hours. Thyroid Function Tests No results for input(s): TSH, T4TOTAL, T3FREE, THYROIDAB in the last 72 hours.  Invalid input(s): FREET3  Other results:   Imaging    DG Swallowing Func-Speech Pathology  Result Date: 07/16/2020 Objective Swallowing Evaluation: Type of Study: Bedside Swallow Evaluation  Patient Details Name: Jerry [name] MRN: [id number] Date of Birth: 1971/04/03 Today's Date: 07/16/2020 Time: SLP Start Time (ACUTE ONLY): 1300 -SLP Stop Time (ACUTE ONLY): 1314 SLP Time Calculation (min) (ACUTE ONLY): 14 min Past Medical History: Past Medical History: Diagnosis Date . Anxiety   . Coronary artery disease  . Myocardial infarction [facility])  Past Surgical History: Past Surgical History: Procedure Laterality Date . BACK SURGERY   . CARDIAC CATHETERIZATION   . CORONARY ANGIOPLASTY   . CORONARY ARTERY BYPASS GRAFT N/A 07/07/2020  Procedure: CORONARY ARTERY BYPASS GRAFTING (CABG) x FOUR, USING LEFT RADIAL ARTERY AND RIGHT LEG GREATER SAPHENOUS VEIN HARVESTED ENDOSCOPICALLY;  Surgeon: Loreli Slot, MD;  Location: [facility] OR;  Service: Open Heart Surgery;  Laterality: N/A; . CORONARY BALLOON ANGIOPLASTY N/A 06/25/2020  Procedure: CORONARY BALLOON ANGIOPLASTY;  Surgeon: Tonny Bollman, MD;  Location: [facility] INVASIVE CV LAB;  Service: Cardiovascular;  Laterality: N/A; . CORONARY THROMBECTOMY N/A 06/25/2020  Procedure: Coronary Thrombectomy;  Surgeon: Tonny Bollman, MD;  Location: [facility] INVASIVE CV LAB;  Service: Cardiovascular;  Laterality: N/A; . CORONARY/GRAFT ACUTE MI REVASCULARIZATION N/A 06/26/2020  Procedure: Coronary/Graft Acute MI Revascularization;  Surgeon: Tonny Bollman, MD;  Location: [facility] INVASIVE CV LAB;  Service: Cardiovascular;  Laterality: N/A; . ENDOVEIN HARVEST OF GREATER SAPHENOUS VEIN Right 07/07/2020  Procedure: ENDOVEIN HARVEST OF GREATER SAPHENOUS VEIN;  Surgeon: Loreli Slot, MD;  Location: [facility] OR;  Service: Open Heart Surgery;  Laterality: Right; . EXPLORATION POST OPERATIVE OPEN HEART N/A 07/08/2020  Procedure: EXPLORATION POST OPERATIVE OPEN HEART TAMPONADE;  Surgeon: Loreli Slot, MD;  Location: [facility] OR;  Service: Open Heart Surgery;  Laterality: N/A; . FRACTURE SURGERY   . LEFT HEART CATH AND CORONARY ANGIOGRAPHY N/A 06/25/2020  Procedure: LEFT HEART CATH AND CORONARY ANGIOGRAPHY;  Surgeon: Tonny Bollman, MD;  Location: [facility] INVASIVE CV LAB;  Service: Cardiovascular;  Laterality: N/A; . PLACEMENT OF IMPELLA LEFT VENTRICULAR ASSIST DEVICE Right 07/07/2020  Procedure: PLACEMENT OF IMPELLA LEFT VENTRICULAR ASSIST DEVICE USING ABIOMED IMPELLA 5.5;  Surgeon:  Loreli Slot, MD;  Location: [facility] OR;  Service: Open Heart Surgery;  Laterality: Right; . PLACEMENT OF IMPELLA LEFT VENTRICULAR ASSIST DEVICE N/A 07/15/2020  Procedure: REMOVAL OF IMPELLA LEFT VENTRICULAR ASSIST DEVICE;  Surgeon: Loreli Slot, MD;  Location: [facility] OR;  Service: Open Heart Surgery;  Laterality: N/A; . RADIAL ARTERY HARVEST Left 07/07/2020  Procedure: RADIAL ARTERY HARVEST left;  Surgeon: Loreli Slot, MD;  Location: [facility] OR;  Service: Open Heart Surgery;  Laterality: Left; . right clavicle fracture reapir  2010 . RIGHT HEART CATH N/A 06/26/2020  Procedure: RIGHT HEART CATH;  Surgeon: Tonny Bollman, MD;  Location: [facility] INVASIVE CV LAB;  Service: Cardiovascular;  Laterality: N/A; . TEE WITHOUT CARDIOVERSION N/A 07/07/2020  Procedure: TRANSESOPHAGEAL ECHOCARDIOGRAM (TEE);  Surgeon: Loreli Slot, MD;  Location: [facility] OR;  Service: Open Heart Surgery;  Laterality: N/A; HPI: 49 y/o male s/p recent back surgery, admitted 06/25/20 with chest pain found to have acute inferior STEMI. Also found to have acute distal RCA occlusion. Presented for CABG on 07/07/20 with diagnosis of CAD. Pt intubated 06/27/20-07/13/20.  No data recorded Assessment / Plan / Recommendation  CHL IP CLINICAL IMPRESSIONS 07/16/2020 Clinical Impression  Pt presents with mild pharyngeal dysphagia characterized by a pharyngeal delay which resulted in aspiration (PAS 7) of thin liquids when pt swallowed the barium tablet. Aspiration did result in coughing but this was ineffective in expelling aspirated material. Prior and subsequent consecutive swallows were tolerated without penetration/aspiration. A regular texture diet with thin liquids will be initiated at this time. It is recommened that medication be administered whole with puree (i.e., applesauce, pudding, yogurt, etc). SLP will follow for dysphagia treatment.  SLP Visit Diagnosis Dysphagia, unspecified (R13.10) Attention and concentration deficit following --  Frontal lobe and executive function deficit following -- Impact on safety and function Moderate aspiration risk   CHL IP TREATMENT RECOMMENDATION 07/16/2020 Treatment Recommendations Therapy as outlined in treatment plan below   Prognosis 07/16/2020 Prognosis for Safe Diet Advancement Good Barriers to Reach Goals Motivation Barriers/Prognosis Comment -- CHL IP DIET RECOMMENDATION 07/16/2020 SLP Diet Recommendations Regular solids;Thin liquid Liquid Administration via Cup;Straw Medication Administration Whole meds with puree Compensations Slow rate;Small sips/bites Postural Changes --   CHL IP OTHER RECOMMENDATIONS 07/16/2020 Recommended Consults -- Oral Care Recommendations Oral care BID Other Recommendations --   CHL IP FOLLOW UP RECOMMENDATIONS 07/16/2020 Follow up Recommendations None   CHL IP FREQUENCY AND DURATION 07/16/2020 Speech Therapy Frequency (ACUTE ONLY) min 2x/week Treatment Duration 2 weeks      CHL IP ORAL PHASE 07/16/2020 Oral Phase WFL Oral - Pudding Teaspoon -- Oral - Pudding Cup -- Oral - Honey Teaspoon -- Oral - Honey Cup -- Oral - Nectar Teaspoon -- Oral - Nectar Cup -- Oral - Nectar Straw -- Oral - Thin Teaspoon -- Oral - Thin Cup -- Oral - Thin Straw -- Oral - Puree -- Oral - Mech Soft -- Oral - Regular -- Oral - Multi-Consistency -- Oral - Pill -- Oral Phase - Comment --  CHL IP PHARYNGEAL PHASE 07/16/2020 Pharyngeal Phase Impaired Pharyngeal- Pudding Teaspoon -- Pharyngeal -- Pharyngeal- Pudding Cup -- Pharyngeal -- Pharyngeal- Honey Teaspoon -- Pharyngeal -- Pharyngeal- Honey Cup -- Pharyngeal -- Pharyngeal- Nectar Teaspoon -- Pharyngeal -- Pharyngeal- Nectar Cup -- Pharyngeal -- Pharyngeal- Nectar Straw -- Pharyngeal -- Pharyngeal- Thin Teaspoon -- Pharyngeal -- Pharyngeal- Thin Cup Delayed swallow initiation-vallecula;Delayed swallow initiation-pyriform sinuses Pharyngeal -- Pharyngeal- Thin Straw Delayed swallow initiation-pyriform sinuses Pharyngeal -- Pharyngeal- Puree WFL Pharyngeal  -- Pharyngeal- Mechanical Soft WFL Pharyngeal -- Pharyngeal- Regular WFL Pharyngeal -- Pharyngeal- Multi-consistency -- Pharyngeal -- Pharyngeal- Pill Delayed swallow initiation-pyriform sinuses;Penetration/Aspiration before swallow;Penetration/Aspiration during swallow Pharyngeal Material enters airway, passes BELOW cords and not ejected out despite cough attempt by patient Pharyngeal Comment --  CHL IP CERVICAL ESOPHAGEAL PHASE 07/16/2020 Cervical Esophageal Phase WFL Pudding Teaspoon -- Pudding Cup -- Honey Teaspoon -- Honey Cup -- Nectar Teaspoon -- Nectar Cup -- Nectar Straw -- Thin Teaspoon -- Thin Cup -- Thin Straw -- Puree -- Mechanical Soft -- Regular -- Multi-consistency -- Pill -- Cervical Esophageal Comment -- Shanika I. Vear ClockPhillips, MS, CCC-SLP Acute Rehabilitation Services Office number [phone] Pager [phone] Scheryl MartenShanika I Phillips 07/16/2020, 1:48 PM              ECHO INTRAOPERATIVE TEE  Result Date: 07/17/2020  *INTRAOPERATIVE TRANSESOPHAGEAL REPORT *  Patient Name:   Jerry LentREVOR [name]  Date of Exam: 07/15/2020 Medical Rec #:  [id number]      Height: Accession #:    [id number]     Weight: Date of Birth:  Nov 13, 1970      BSA: Patient Age:    [age]  years       BP:           170/97 mmHg Patient Gender: M              HR:           87 bpm. Exam Location:  Anesthesiology Transesophogeal exam was perform intraoperatively during surgical procedure. Patient was closely monitored under general anesthesia during the entirety of examination. Indications:     Impella Removal Performing Phys: Kipp Brood MD Diagnosing Phys: Kipp Brood MD PROCEDURE: Intraoperative Transesophogeal The patient was brought to the operating room for removal of Jerry impella LVAD. General anesthesia was induced uneventfully and the TEE proble was inserted without difficulty. Complications: No known complications during this procedure. PRE-OP FINDINGS  Left Ventricle: There was Jerry impella cannula within the LV cavity. The inflow port was  3.1 cm from the aortic valve. The impella was then removed without difficulty. The LV ejection fraction was estimated at 30-35% after the Impella was removed. There was septal akinesis diffuse LV hypokinesis in the remaining LV segements. Right Ventricle: The RV was normal in size with normal RV systolic function. Pericardium: There is no evidence of pericardial effusion. Mitral Valve: There was Jerry annuloplaty ring in the mitral position. The posterior had restricted mobility and the anterior leaflet moved normally. The mean trans-mitral gradient was 3 mm hg. Tricuspid Valve: The tricuspid valve appeared structurally normal with trace tricuspid insufficiency. +-------------+---------++ MITRAL VALVE           +-------------+---------++ MV Peak grad:7.5 mmHg  +-------------+---------++ MV Mean grad:3.0 mmHg  +-------------+---------++ MV Vmax:     1.37 m/s  +-------------+---------++ MV Vmean:    75.5 cm/s +-------------+---------++ MV VTI:      0.46 m    +-------------+---------++  Kipp Brood MD Electronically signed by Kipp Brood MD Signature Date/Time: 07/17/2020/7:51:21 AM   Final      Medications:     Scheduled Medications: . amiodarone  200 mg Oral BID  . aspirin EC  325 mg Oral Daily   Or  . aspirin  324 mg Per Tube Daily  . atorvastatin  80 mg Per Tube Daily  . bisacodyl  10 mg Oral Daily   Or  . bisacodyl  10 mg Rectal Daily  . chlorhexidine  15 mL Mouth Rinse BID  . Chlorhexidine Gluconate Cloth  6 each Topical Daily  . colchicine  0.6 mg Per Tube Daily  . digoxin  0.125 mg Oral Daily  . docusate  200 mg Per Tube Daily  . feeding supplement  237 mL Oral TID BM  . furosemide  40 mg Oral Daily  . insulin aspart  0-24 Units Subcutaneous Q4H  . mouth rinse  15 mL Mouth Rinse q12n4p  . pantoprazole  40 mg Oral Daily  . sacubitril-valsartan  1 tablet Oral BID  . sodium chloride flush  10-40 mL Intracatheter Q12H  . sodium chloride flush  3 mL Intravenous Q12H   . spironolactone  12.5 mg Oral Daily  . traZODone  100 mg Per Tube QHS    Infusions: . sodium chloride 10 mL/hr at 07/17/20 0700  . albumin human 12.5 g (07/12/20 2148)  . cefTAZidime (FORTAZ)  IV Stopped (07/17/20 0535)  . dexmedetomidine (PRECEDEX) IV infusion Stopped (07/14/20 1627)  . heparin 1,700 Units/hr (07/17/20 0700)  . lactated ringers 10 mL/hr at 07/15/20 1200    PRN Medications: acetaminophen, albumin human, ALPRAZolam, clonazepam, metoprolol tartrate, ondansetron (ZOFRAN) IV, oxyCODONE, sodium chloride flush, sodium chloride  flush, traMADol    Assessment/Plan   1. CAD: Late presentation inferior MI.  Cath showed thrombotic occlusion distal RCA, CTO LAD with collaterals from right, and complex 90% proximal LCx stenosis.  He had PTCA/thrombectomy RCA with good flow at end of procedure.  Suspect he had damage to LAD territory as well as RCA territory as RCA collateralized the LAD.  Now s/p CABG with LIMA-LAD, left radial to OM, seq SVG-PDA/PLV - ASA/statin.  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 Suspect post-MI pericarditis. Symptoms resolved prior to surgery.  Recurrent friction rub post-surgery.  - Continue colchicine 0.6 mg daily. .  3. Acute systolic CHF>>Cardiogenic Shock: Echo with EF 25-30%, RV ok (no evidence for RV infarct), LV thrombus.  Repeat bedside echo 06/27/20  EF 25%, normal RV w/ no pericardial effusion, no LV thrombus seen on this echo. Post-op shock, had Impella 5.5 in place.  Had to return to OR on 10/14 with large pericardial space hematoma obstructing the right atrium, hemodynamics much better since removal. S/P Impella removal 10/21. Today, co-ox 83% on milrinone 0.125. Volume status looks ok.  - Stop milrinone today.   - Continue spironolactone.  - Continue digoxin.  - Stop losartan, start Entresto 24/26 bid.  3. AF with RVR: Noted 10/12, now in NSR on  amiodarone.  - Stop amiodarone gtt, start po amiodarone.  - Start warfarin for AF and LV thrombus if ok with TCTS.  5. LV thrombus: Resolved on 10/3 echo.  - on heparin gtt, start warfarin today if ok with TCTS.  6. ID: Initial concern for RLL PNA. Completed 7 days vancomycin/cefepime.  Steady fever post-op WBC 17.  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  - Ceftazidime x 7 days.  7. Thrombocytopenia: Post-op. Resolved. 8. S/p back surgery: Micro-discectomy just prior to admission.  Neurosurgery has seen.   9. Possible benzo/narcotic withdrawal: Concern GF brought him drugs pre-op.  10. Pericardial hematoma: Obstructed RA post-CABG, had to go back to OR on 10/14.   11. Anemia: Post-op blood loss.  - Transfuse to keep hgb > 8. Stable today.  12. Acute hypoxic respiratory failure.   - extubated 10/19  13. Transaminitis:  Likely shock liver, LFTs trending down.    Can go to step down from my perspective.   Marca Ancona 07/17/2020 8:14 AM

## 2020-07-18 DIAGNOSIS — I5043 Acute on chronic combined systolic (congestive) and diastolic (congestive) heart failure: Secondary | ICD-10-CM | POA: Diagnosis not present

## 2020-07-18 LAB — PROTIME-INR
INR: 2.1 — ABNORMAL HIGH (ref 0.8–1.2)
Prothrombin Time: 22.5 seconds — ABNORMAL HIGH (ref 11.4–15.2)

## 2020-07-18 LAB — BASIC METABOLIC PANEL
Anion gap: 10 (ref 5–15)
BUN: 15 mg/dL (ref 6–20)
CO2: 23 mmol/L (ref 22–32)
Calcium: 8.6 mg/dL — ABNORMAL LOW (ref 8.9–10.3)
Chloride: 105 mmol/L (ref 98–111)
Creatinine, Ser: 0.87 mg/dL (ref 0.61–1.24)
GFR, Estimated: >60 mL/min (ref >60)
Glucose, Bld: 98 mg/dL (ref 70–99)
Potassium: 3.7 mmol/L (ref 3.5–5.1)
Sodium: 138 mmol/L (ref 135–145)

## 2020-07-18 LAB — COOXEMETRY PANEL
Carboxyhemoglobin: 1.7 % — ABNORMAL HIGH (ref 0.5–1.5)
Methemoglobin: 0.7 % (ref 0.0–1.5)
O2 Saturation: 95.3 %
Total hemoglobin: 11.1 g/dL — ABNORMAL LOW (ref 12.0–16.0)

## 2020-07-18 MED ORDER — SPIRONOLACTONE 25 MG PO TABS
25.0000 mg | ORAL_TABLET | Freq: Every day | ORAL | Status: DC
  Administered 2020-07-19 – 2020-07-21 (×3): 25 mg via ORAL
  Filled 2020-07-18 (×3): qty 1

## 2020-07-18 MED ORDER — WARFARIN SODIUM 1 MG PO TABS
1.0000 mg | ORAL_TABLET | Freq: Once | ORAL | Status: AC
  Administered 2020-07-18: 1 mg via ORAL
  Filled 2020-07-18: qty 1

## 2020-07-18 NOTE — Progress Notes (Signed)
Patient ID: Jerry [name], male   DOB: 07/10/1971, 49 y.o.   MRN: [id number] P    Advanced Heart Failure Rounding Note  PCP-Cardiologist: Tonny Bollman, MD    Patient Profile   49 y/o male s/p recent back surgery, admitted for acute inferior and anterolateral MI. LHC showed thrombotic occlusion of distal RCA that was treated with PTCA and thrombectomy.  He was also noted to have chronic total occlusion of the LAD with collaterals from the right (thus this territory was affected by the RCA MI) as well as 90% complex proximal LCx stenosis. EF 25-30% + apical thrombus. RV normal. Course b/c cardiogenic shock and acute hypoxic respiratory failure. ? Component of septic shock.    Subjective:    - CABG + MV repair and placement of Impella 5.5 on 10/13.  - Back to OR on 10/14 with large hematoma obstructing right atrium.  - Extubated 10/19  - S/P Impella removal 10/21  He is now off milrinone.  In NSR on po amiodarone.  No labs today yet.   Denies SOB. Denies pain. Walked in hall already today.   Objective:   Weight Range: 69.1 kg Body mass index is 23.16 kg/m.   Vital Signs:   Temp:  [97.6 F (36.4 C)-98.5 F (36.9 C)] 97.7 F (36.5 C) (10/24 0803) Pulse Rate:  [90-104] 96 (10/24 0803) Resp:  [17-27] 20 (10/24 0803) BP: (94-121)/(65-99) 107/71 (10/24 0803) SpO2:  [94 %-100 %] 96 % (10/24 0803) Weight:  [69.1 kg-70.6 kg] 69.1 kg (10/24 0455) Last BM Date: 07/17/20  Weight change: Filed Weights   07/17/20 0600 07/17/20 1710 07/18/20 0455  Weight: 71.4 kg 70.6 kg 69.1 kg    Intake/Output:   Intake/Output Summary (Last 24 hours) at 07/18/2020 1043 Last data filed at 07/18/2020 0458 Gross per 24 hour  Intake 63.05 ml  Output 1740 ml  Net -1676.95 ml      Physical Exam   General: NAD Neck: No JVD, no thyromegaly or thyroid nodule.  Lungs: Clear to auscultation bilaterally with normal respiratory effort. CV: Nondisplaced PMI.  Heart regular S1/S2, no S3/S4, no  murmur.  No peripheral edema.   Abdomen: Soft, nontender, no hepatosplenomegaly, no distention.  Skin: Intact without lesions or rashes.  Neurologic: Alert and oriented x 3.  Psych: Normal affect. Extremities: No clubbing or cyanosis.  HEENT: Normal.    Telemetry   NSR 90s (personally reviewed).   Labs    CBC Recent Labs    07/16/20 0328 07/17/20 0132  WBC 16.0* 15.8*  HGB 10.1* 9.9*  HCT 31.2* 31.0*  MCV 89.9 90.6  PLT 283 328   Basic Metabolic Panel Recent Labs    61/60/73 0728 07/17/20 0132  NA 137 140  K 4.1 3.9  CL 105 110  CO2 22 21*  GLUCOSE 115* 103*  BUN 12 14  CREATININE 0.80 0.92  CALCIUM 8.4* 8.3*   Liver Function Tests No results for input(s): AST, ALT, ALKPHOS, BILITOT, PROT, ALBUMIN in the last 72 hours. No results for input(s): LIPASE, AMYLASE in the last 72 hours. Cardiac Enzymes No results for input(s): CKTOTAL, CKMB, CKMBINDEX, TROPONINI in the last 72 hours.  BNP: BNP (last 3 results) Recent Labs    06/25/20 2334 06/26/20 0150  BNP 349.1* 368.4*    ProBNP (last 3 results) No results for input(s): PROBNP in the last 8760 hours.   D-Dimer No results for input(s): DDIMER in the last 72 hours. Hemoglobin A1C No results for input(s): HGBA1C in the last  72 hours. Fasting Lipid Panel No results for input(s): CHOL, HDL, LDLCALC, TRIG, CHOLHDL, LDLDIRECT in the last 72 hours. Thyroid Function Tests No results for input(s): TSH, T4TOTAL, T3FREE, THYROIDAB in the last 72 hours.  Invalid input(s): FREET3  Other results:   Imaging    No results found.   Medications:     Scheduled Medications: . amiodarone  200 mg Oral BID  . aspirin EC  325 mg Oral Daily  . atorvastatin  80 mg Per Tube Daily  . chlorhexidine  15 mL Mouth Rinse BID  . Chlorhexidine Gluconate Cloth  6 each Topical Daily  . colchicine  0.6 mg Oral Daily  . [facility], Patient & Family Education   Does not apply Once  . digoxin  0.125 mg  Oral Daily  . feeding supplement  237 mL Oral TID BM  . furosemide  40 mg Oral Daily  . mouth rinse  15 mL Mouth Rinse q12n4p  . pantoprazole  40 mg Oral QAC breakfast  . sacubitril-valsartan  1 tablet Oral BID  . sodium chloride flush  3 mL Intravenous Q12H  . spironolactone  12.5 mg Oral Daily  . traZODone  100 mg Oral QHS  . Warfarin - Pharmacist Dosing Inpatient   Does not apply q1600    Infusions: . sodium chloride      PRN Medications: sodium chloride, acetaminophen, ALPRAZolam, ondansetron **OR** ondansetron (ZOFRAN) IV, sodium chloride flush, traMADol    Assessment/Plan   1. CAD: Late presentation inferior MI.  Cath showed thrombotic occlusion distal RCA, CTO LAD with collaterals from right, and complex 90% proximal LCx stenosis.  He had PTCA/thrombectomy RCA with good flow at end of procedure.  Suspect he had damage to LAD territory as well as RCA territory as RCA collateralized the LAD.  Now s/p CABG with LIMA-LAD, left radial to OM, seq SVG-PDA/PLV - ASA/statin.  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 Suspect post-MI pericarditis. Symptoms resolved prior to surgery.  Recurrent friction rub post-surgery, now resolved.  - Continue colchicine 0.6 mg daily. .  3. Acute systolic CHF>>Cardiogenic Shock: Echo with EF 25-30%, RV ok (no evidence for RV infarct), LV thrombus.  Repeat bedside echo 06/27/20  EF 25%, normal RV w/ no pericardial effusion, no LV thrombus seen on this echo. Post-op shock, had Impella 5.5 in place.  Had to return to OR on 10/14 with large pericardial space hematoma obstructing the right atrium, hemodynamics much better since removal. S/P Impella removal 10/21. He is now off milrinone. Volume status looks ok.  - Needs co-ox and BMET sent.   - Continue spironolactone 12.5 daily.  - Continue digoxin.  - Continue Entresto 24/26 bid.  - If co-ox and BP remains stable, start  low dose Coreg tomorrow.  3. AF with RVR: Noted 10/12, now in NSR on amiodarone.  - Continue po amiodarone - Now on warfarin, heparin gtt was stopped.  5. LV thrombus: Resolved on 10/3 echo.  - Continue warfarin, dosing per pharmacy.   6. ID: Initial concern for RLL PNA. Completed 7 days vancomycin/cefepime.  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  No afebrile.  - He is now off ceftazidime.  7. Thrombocytopenia: Post-op. Resolved. 8. S/p back surgery: Micro-discectomy just prior to admission.  Neurosurgery has seen.   9. Possible benzo/narcotic withdrawal: Concern GF brought him drugs pre-op.  10. Pericardial hematoma: Obstructed RA  post-CABG, had to go back to OR on 10/14.   11. Anemia: Post-op blood loss.  - Transfuse to keep hgb > 8. Stable today.  12. Acute hypoxic respiratory failure.   - extubated 10/19  13. Transaminitis:  Likely shock liver, LFTs trending down.     Marca Ancona 07/18/2020 10:43 AM

## 2020-07-18 NOTE — Progress Notes (Signed)
Mobility Specialist: Progress Note   07/18/20 1325  Mobility  Activity Ambulated in hall  Level of Assistance Independent  Assistive Device None  Distance Ambulated (ft) 470 ft  Mobility Response Tolerated well  Mobility performed by Family member   Pt seen ambulating in hallway with his mom. Pt said he felt a little SOB after returning to his room but quickly resolved after sitting for a minute. Pt said this was his third walk today. Encouraged pt to continue walking as tolerated.   [facility]  Mobility Specialist

## 2020-07-18 NOTE — Progress Notes (Addendum)
      [address]       [place] [address]             [phone]        3 Days Post-Op Procedure(s) (LRB): REMOVAL OF IMPELLA LEFT VENTRICULAR ASSIST DEVICE (N/A)  Subjective: Patient continues with hoarseness, which he has had. He has no specific complaint this am.  Objective: Vital signs in last 24 hours: Temp:  [97.4 F (36.3 C)-98.5 F (36.9 C)] 97.9 F (36.6 C) (10/24 0455) Pulse Rate:  [90-104] 97 (10/24 0455) Cardiac Rhythm: Sinus tachycardia (10/23 1900) Resp:  [17-29] 25 (10/24 0455) BP: (84-121)/(62-99) 105/74 (10/24 0455) SpO2:  [93 %-100 %] 96 % (10/24 0455) Weight:  [69.1 kg-70.6 kg] 69.1 kg (10/24 0455)  Pre op weight 71 kg Current Weight  07/18/20 69.1 kg    Intake/Output from previous day: 10/23 0701 - 10/24 0700 In: 110 [I.V.:110] Out: 1740 [Urine:1740]   Physical Exam:  Cardiovascular: Slightly tachycardic, no murmur Pulmonary: Clear to auscultation bilaterally Abdomen: Soft, non tender, bowel sounds present. Extremities: No lower extremity edema. Motor/sensory intact LUE  Wounds: Sternal and LUE wounds are clean and dry.  No erythema or signs of infection. Aquacel removed from right anterior axillary wound and wound is clean and dry.  Lab Results: CBC: Recent Labs    07/16/20 0328 07/17/20 0132  WBC 16.0* 15.8*  HGB 10.1* 9.9*  HCT 31.2* 31.0*  PLT 283 328   BMET:  Recent Labs    07/16/20 0728 07/17/20 0132  NA 137 140  K 4.1 3.9  CL 105 110  CO2 22 21*  GLUCOSE 115* 103*  BUN 12 14  CREATININE 0.80 0.92  CALCIUM 8.4* 8.3*    PT/INR:  Lab Results  Component Value Date   INR 2.3 (H) 07/09/2020   INR 2.2 (H) 07/08/2020   INR 3.9 (H) 07/08/2020   ABG:  INR: Will add last result for INR, ABG once components are confirmed Will add last 4 CBG results once components are confirmed  Assessment/Plan:  1. CV - Previous a fib with RVR. Maintaining SR with HR in the 90's. On Amiodarone 200 mg bid,  Digoxin 0.125 mg daily, , Entresto bid, and Coumadin (history of LV thrombus). Await INR result this am;pharmacy dosing Coumadin. 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 2.  Pulmonary - On room air. PA/LAT CXR ordered for Monday. Encourage incentive spirometer 3. Acute systolic heart failure- On Lasix 40 mg daily andSpironolactone 12.5 mg daily  4.  Expected post op acute blood loss anemia - H and H yesterday 9.9 and 31. 5. Deconditioned-continue with PT/OT/cardiac rehab  Donielle M ZimmermanPA-C 07/18/2020,7:37 AM   Chart reviewed, patient examined, agree with above. On Coumadin for hx of LV thrombus. INR 2.1 today.

## 2020-07-18 NOTE — Progress Notes (Signed)
ANTICOAGULATION CONSULT NOTE - Follow Up Consult  Pharmacy Consult for Warfarin Indication: LV thrombus / AFib  Labs: Recent Labs    07/15/20 1347 07/15/20 1347 07/16/20 0328 07/16/20 0728 07/16/20 1532 07/17/20 0132  HGB 10.9*   < > 10.1*  --   --  9.9*  HCT 32.0*  --  31.2*  --   --  31.0*  PLT  --   --  283  --   --  328  HEPARINUNFRC  --   --  <0.10*  --  <0.10* 0.40  CREATININE 0.70  --   --  0.80  --  0.92   < > = values in this interval not displayed.    Assessment: 84 YOM admitted with STEMI with concern for LV thrombus on ECHO 10/2 previously treated with heparin. Pharmacy has been consulted to dose warfarin. Not continuing heparin to minimize bleed risk (lumbar microdiscetomy 9/29).  Baseline INR on admission 1.2. INR is therapeutic at 2.1 after only 1 dose of warfarin. LFTs elevated (AST 66, ALT 368) and recently started on amiodarone. No bleeding noted.   Goal of Therapy:  INR 2-3   Plan:  Warfarin 1mg  PO x1 tonight Daily INR, CBC  , PharmD PGY1 Acute Care Pharmacy Resident Phone: [phone] 07/18/2020 7:57 AM  Please check [email or web address] for unit specific pharmacy phone numbers.

## 2020-07-19 ENCOUNTER — Inpatient Hospital Stay (HOSPITAL_COMMUNITY): Payer: 59

## 2020-07-19 DIAGNOSIS — I5043 Acute on chronic combined systolic (congestive) and diastolic (congestive) heart failure: Secondary | ICD-10-CM | POA: Diagnosis not present

## 2020-07-19 LAB — CBC
HCT: 36.3 % — ABNORMAL LOW (ref 39.0–52.0)
Hemoglobin: 11.2 g/dL — ABNORMAL LOW (ref 13.0–17.0)
MCH: 28.6 pg (ref 26.0–34.0)
MCHC: 30.9 g/dL (ref 30.0–36.0)
MCV: 92.6 fL (ref 80.0–100.0)
Platelets: 483 10*3/uL — ABNORMAL HIGH (ref 150–400)
RBC: 3.92 MIL/uL — ABNORMAL LOW (ref 4.22–5.81)
RDW: 15.1 % (ref 11.5–15.5)
WBC: 16.5 10*3/uL — ABNORMAL HIGH (ref 4.0–10.5)
nRBC: 0 % (ref 0.0–0.2)

## 2020-07-19 LAB — BASIC METABOLIC PANEL
Anion gap: 10 (ref 5–15)
BUN: 16 mg/dL (ref 6–20)
CO2: 22 mmol/L (ref 22–32)
Calcium: 8.9 mg/dL (ref 8.9–10.3)
Chloride: 108 mmol/L (ref 98–111)
Creatinine, Ser: 0.92 mg/dL (ref 0.61–1.24)
GFR, Estimated: >60 mL/min (ref >60)
Glucose, Bld: 109 mg/dL — ABNORMAL HIGH (ref 70–99)
Potassium: 3.5 mmol/L (ref 3.5–5.1)
Sodium: 140 mmol/L (ref 135–145)

## 2020-07-19 LAB — PROTIME-INR
INR: 1.8 — ABNORMAL HIGH (ref 0.8–1.2)
Prothrombin Time: 20.6 seconds — ABNORMAL HIGH (ref 11.4–15.2)

## 2020-07-19 LAB — COOXEMETRY PANEL
Carboxyhemoglobin: 1.4 % (ref 0.5–1.5)
Methemoglobin: 0.7 % (ref 0.0–1.5)
O2 Saturation: 54.7 %
Total hemoglobin: 11.7 g/dL — ABNORMAL LOW (ref 12.0–16.0)

## 2020-07-19 MED ORDER — CARVEDILOL 3.125 MG PO TABS
3.1250 mg | ORAL_TABLET | Freq: Two times a day (BID) | ORAL | Status: DC
  Administered 2020-07-19 – 2020-07-21 (×4): 3.125 mg via ORAL
  Filled 2020-07-19 (×4): qty 1

## 2020-07-19 MED ORDER — COUMADIN BOOK
Freq: Once | Status: DC
  Filled 2020-07-19: qty 1

## 2020-07-19 MED ORDER — WARFARIN SODIUM 2 MG PO TABS
2.0000 mg | ORAL_TABLET | Freq: Once | ORAL | Status: AC
  Administered 2020-07-19: 2 mg via ORAL
  Filled 2020-07-19: qty 1

## 2020-07-19 MED ORDER — POTASSIUM CHLORIDE CRYS ER 20 MEQ PO TBCR
20.0000 meq | EXTENDED_RELEASE_TABLET | Freq: Two times a day (BID) | ORAL | Status: DC
  Administered 2020-07-19 – 2020-07-21 (×5): 20 meq via ORAL
  Filled 2020-07-19 (×5): qty 1

## 2020-07-19 NOTE — Progress Notes (Addendum)
Patient ID: Jerry [name], male   DOB: 05-Apr-1971, 49 y.o.   MRN: [id number] P    Advanced Heart Failure Rounding Note  PCP-Cardiologist: Tonny Bollman, MD    Patient Profile   49 y/o male s/p recent back surgery, admitted for acute inferior and anterolateral MI. LHC showed thrombotic occlusion of distal RCA that was treated with PTCA and thrombectomy.  He was also noted to have chronic total occlusion of the LAD with collaterals from the right (thus this territory was affected by the RCA MI) as well as 90% complex proximal LCx stenosis. EF 25-30% + apical thrombus. RV normal. Course b/c cardiogenic shock and acute hypoxic respiratory failure. ? Component of septic shock.    Subjective:    - CABG + MV repair and placement of Impella 5.5 on 10/13.  - Back to OR on 10/14 with large hematoma obstructing right atrium.  - Extubated 10/19  - S/P Impella removal 10/21  Denies SOB. Walked up the hall 8 times and has walked up steps.    Objective:   Weight Range: 66.5 kg Body mass index is 22.29 kg/m.   Vital Signs:   Temp:  [97.6 F (36.4 C)-98.7 F (37.1 C)] 98.7 F (37.1 C) (10/25 0815) Pulse Rate:  [83-107] 107 (10/25 0815) Resp:  [18-26] 20 (10/25 0815) BP: (101-118)/(69-86) 118/72 (10/25 0815) SpO2:  [95 %-100 %] 100 % (10/25 0815) Weight:  [66.5 kg] 66.5 kg (10/25 0500) Last BM Date: 07/19/20  Weight change: Filed Weights   07/17/20 1710 07/18/20 0455 07/19/20 0500  Weight: 70.6 kg 69.1 kg 66.5 kg    Intake/Output:   Intake/Output Summary (Last 24 hours) at 07/19/2020 1126 Last data filed at 07/19/2020 0011 Gross per 24 hour  Intake 600 ml  Output 400 ml  Net 200 ml      Physical Exam   General:  Sitting in the chair. No resp difficulty HEENT: normal Neck: supple. no JVD. Carotids 2+ bilat; no bruits. No lymphadenopathy or thryomegaly appreciated. Cor: PMI nondisplaced. Regular rate & rhythm. No rubs, gallops or murmurs. Sternal staples intact. R upper  chest staples intact.  Lungs: clear Abdomen: soft, nontender, nondistended. No hepatosplenomegaly. No bruits or masses. Good bowel sounds. Extremities: no cyanosis, clubbing, rash, edema. LUE incision approximated. LUE PICC Neuro: alert & orientedx3, cranial nerves grossly intact. moves all 4 extremities w/o difficulty. Affect pleasant   Telemetry   Sinus Tach low 100s. Personally reviewed.   Labs    CBC Recent Labs    07/17/20 0132 07/19/20 0530  WBC 15.8* 16.5*  HGB 9.9* 11.2*  HCT 31.0* 36.3*  MCV 90.6 92.6  PLT 328 483*   Basic Metabolic Panel Recent Labs    16/38/45 1050 07/19/20 0530  NA 138 140  K 3.7 3.5  CL 105 108  CO2 23 22  GLUCOSE 98 109*  BUN 15 16  CREATININE 0.87 0.92  CALCIUM 8.6* 8.9   Liver Function Tests No results for input(s): AST, ALT, ALKPHOS, BILITOT, PROT, ALBUMIN in the last 72 hours. No results for input(s): LIPASE, AMYLASE in the last 72 hours. Cardiac Enzymes No results for input(s): CKTOTAL, CKMB, CKMBINDEX, TROPONINI in the last 72 hours.  BNP: BNP (last 3 results) Recent Labs    06/25/20 2334 06/26/20 0150  BNP 349.1* 368.4*    ProBNP (last 3 results) No results for input(s): PROBNP in the last 8760 hours.   D-Dimer No results for input(s): DDIMER in the last 72 hours. Hemoglobin A1C No results for  input(s): HGBA1C in the last 72 hours. Fasting Lipid Panel No results for input(s): CHOL, HDL, LDLCALC, TRIG, CHOLHDL, LDLDIRECT in the last 72 hours. Thyroid Function Tests No results for input(s): TSH, T4TOTAL, T3FREE, THYROIDAB in the last 72 hours.  Invalid input(s): FREET3  Other results:   Imaging    DG Chest 2 View  Result Date: 07/19/2020 CLINICAL DATA:  Pleural effusion EXAM: CHEST - 2 VIEW COMPARISON:  Three days ago FINDINGS: Stable elevated left diaphragm with hazy opacity at the base. No visible pneumothorax. Postoperative left apex and right axilla. Right clavicle fracture and plating. Left PICC with  tip at the upper cavoatrial junction. Prior CABG. IMPRESSION: Pleural effusions with presumed atelectasis on the left. Aeration has improved from 3 days ago. Electronically Signed   By: Marnee Spring M.D.   On: 07/19/2020 06:27     Medications:     Scheduled Medications: . amiodarone  200 mg Oral BID  . aspirin EC  325 mg Oral Daily  . atorvastatin  80 mg Per Tube Daily  . chlorhexidine  15 mL Mouth Rinse BID  . Chlorhexidine Gluconate Cloth  6 each Topical Daily  . colchicine  0.6 mg Oral Daily  .  Cardiac Surgery, Patient & Family Education   Does not apply Once  . digoxin  0.125 mg Oral Daily  . feeding supplement  237 mL Oral TID BM  . furosemide  40 mg Oral Daily  . mouth rinse  15 mL Mouth Rinse q12n4p  . pantoprazole  40 mg Oral QAC breakfast  . potassium chloride  20 mEq Oral BID  . sacubitril-valsartan  1 tablet Oral BID  . sodium chloride flush  3 mL Intravenous Q12H  . spironolactone  25 mg Oral Daily  . traZODone  100 mg Oral QHS  . warfarin  2 mg Oral ONCE-1600  . Warfarin - Pharmacist Dosing Inpatient   Does not apply q1600    Infusions: . sodium chloride      PRN Medications: sodium chloride, acetaminophen, ALPRAZolam, ondansetron **OR** ondansetron (ZOFRAN) IV, sodium chloride flush, traMADol    Assessment/Plan   1. CAD: Late presentation inferior MI.  Cath showed thrombotic occlusion distal RCA, CTO LAD with collaterals from right, and complex 90% proximal LCx stenosis.  He had PTCA/thrombectomy RCA with good flow at end of procedure.  Suspect he had damage to LAD territory as well as RCA territory as RCA collateralized the LAD.  Now s/p CABG with LIMA-LAD, left radial to OM, seq SVG-PDA/PLV - ASA/statin. Can drop aspirin to 81 mg once INR therapeutic.  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 Suspect post-MI pericarditis. Symptoms resolved prior to surgery.   Recurrent friction rub post-surgery, now resolved.  - Continue colchicine 0.6 mg daily. Plan to continue for total 3 months.  3. Acute systolic CHF>>Cardiogenic Shock: Echo with EF 25-30%, RV ok (no evidence for RV infarct), LV thrombus.  Repeat bedside echo 06/27/20  EF 25%, normal RV w/ no pericardial effusion, no LV thrombus seen on this echo. Post-op shock, had Impella 5.5 in place.  Had to return to OR on 10/14 with large pericardial space hematoma obstructing the right atrium, hemodynamics much better since removal. S/P Impella removal 10/21. He is now off milrinone. Volume status looks ok.  - CO-OX  55% , stable.  - Continue spironolactone 25 mg daily. daily.  - Continue digoxin.  - Continue  Entresto 24/26 bid.  - Add coreg 3.125 mg twice a day.  3. AF with RVR: Noted 10/12, now in NSR on amiodarone.  - In Sinus Tach. Continue po amiodarone - Now on warfarin, heparin gtt was stopped.  5. LV thrombus: Resolved on 10/3 echo.  - Continue warfarin, dosing per pharmacy.   6. ID: Initial concern for RLL PNA. Completed 7 days vancomycin/cefepime.  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  No afebrile.  - He is now off ceftazidime.  7. Thrombocytopenia: Post-op. Resolved. 8. S/p back surgery: Micro-discectomy just prior to admission.  Neurosurgery has seen.   9. Possible benzo/narcotic withdrawal: Concern GF brought him drugs pre-op.  10. Pericardial hematoma: Obstructed RA post-CABG, had to go back to OR on 10/14.   11. Anemia: Post-op blood loss.  - Transfuse to keep hgb > 8. Stable today.  12. Acute hypoxic respiratory failure.   - extubated 10/19  13. Transaminitis:  Likely shock liver, LFTs trending down.    Amy Clegg NP-C  07/19/2020 11:26 AM  Patient seen with NP, agree with the above note.   Co-ox stable at 55%, he is not volume overloaded on exam.  Feels good overall.   Will add Coreg 3.125 mg bid today.   He continues on warfarin for  LV thrombus, when INR is therapeutic would decrease ASA to 81 mg daily.  He remains in NSR on po amiodarone.   Marca Ancona 07/19/2020 11:45 AM

## 2020-07-19 NOTE — Progress Notes (Signed)
Mobility Specialist: Progress Note   07/19/20 1359  Mobility  Activity Ambulated in hall  Level of Assistance Independent  Assistive Device None  Distance Ambulated (ft) 470 ft  Mobility Response Tolerated well  $Mobility charge 1 Mobility   Pt seen ambulating independently in hallway. Pt states he has no c/o during ambulation. Pt said he has been up walking 4x today, 3 independently and once with PT.   [facility]  Mobility Specialist

## 2020-07-19 NOTE — Progress Notes (Signed)
4 Days Post-Op Procedure(s) (LRB): REMOVAL OF IMPELLA LEFT VENTRICULAR ASSIST DEVICE (N/A) Subjective: Some incisional pain but not taking pain meds Still hoarse, about the same. No problems with swallowing Eating well  Objective: Vital signs in last 24 hours: Temp:  [97.6 F (36.4 C)-98.1 F (36.7 C)] 98 F (36.7 C) (10/25 0454) Pulse Rate:  [83-103] 100 (10/25 0454) Cardiac Rhythm: Sinus tachycardia (10/24 1927) Resp:  [18-26] 20 (10/25 0454) BP: (101-115)/(69-86) 111/70 (10/25 0454) SpO2:  [95 %-97 %] 97 % (10/25 0454) Weight:  [66.5 kg] 66.5 kg (10/25 0500)  Hemodynamic parameters for last 24 hours:    Intake/Output from previous day: 10/24 0701 - 10/25 0700 In: 840 [P.O.:840] Out: 400 [Urine:400] Intake/Output this shift: No intake/output data recorded.  General appearance: alert, cooperative and no distress Neurologic: intact Heart: tachy, regular Lungs: diminished breath sounds left base Extremities: no edema Wound: clean and dry  Lab Results: Recent Labs    07/17/20 0132 07/19/20 0530  WBC 15.8* 16.5*  HGB 9.9* 11.2*  HCT 31.0* 36.3*  PLT 328 483*   BMET:  Recent Labs    07/18/20 1050 07/19/20 0530  NA 138 140  K 3.7 3.5  CL 105 108  CO2 23 22  GLUCOSE 98 109*  BUN 15 16  CREATININE 0.87 0.92  CALCIUM 8.6* 8.9    PT/INR:  Recent Labs    07/18/20 1050  LABPROT 22.5*  INR 2.1*   ABG    Component Value Date/Time   PHART 7.378 07/15/2020 1344   HCO3 27.7 07/15/2020 1344   TCO2 26 07/15/2020 1347   ACIDBASEDEF 6.0 (H) 07/08/2020 1619   O2SAT 54.7 07/19/2020 0505   CBG (last 3)  Recent Labs    07/17/20 0000 07/17/20 0739 07/17/20 1121  GLUCAP 108* 134* 124*    Assessment/Plan: S/P Procedure(s) (LRB): REMOVAL OF IMPELLA LEFT VENTRICULAR ASSIST DEVICE (N/A) Looks great  CV- in SR, tachy after walk  On dig and amiodarone  Co-ox 54 this AM  Coumadin started, INR pending RESP_ mild elevation of left hemidiaphragm, CXR o/w  OK RENAL- creatinine OK  Weight down significantly from admission, on Lasix 40 mg dailyu  K low end of normal- supplement ENDO- CBG well controlled Deconditioning- improving rapidly Leukocytosis- persistent, no fever off antibiotics   LOS: 24 days    Loreli Slot 07/19/2020

## 2020-07-19 NOTE — Progress Notes (Signed)
ANTICOAGULATION CONSULT NOTE - Follow Up Consult  Pharmacy Consult for Warfarin Indication: LV thrombus / AFib  Labs: Recent Labs    07/16/20 1532 07/17/20 0132 07/18/20 1050 07/19/20 0530 07/19/20 0947  HGB  --  9.9*  --  11.2*  --   HCT  --  31.0*  --  36.3*  --   PLT  --  328  --  483*  --   LABPROT  --   --  22.5*  --  20.6*  INR  --   --  2.1*  --  1.8*  HEPARINUNFRC <0.10* 0.40  --   --   --   CREATININE  --  0.92 0.87 0.92  --     Assessment: 30 YOM admitted with STEMI with concern for LV thrombus on ECHO 10/2 previously treated with heparin. Pharmacy has been consulted to dose warfarin. Not continuing heparin to minimize bleed risk (lumbar microdiscetomy 9/29).  Baseline INR on admission 1.2. INR has dropped below goal at 1.8 this morning. Last LFTs elevated (10/20 AST 66, ALT 368) and recently started on amiodarone. No bleeding noted.   Goal of Therapy:  INR 2-3   Plan:  Warfarin 2mg  PO x1 tonight Daily INR, CBC   PharmD., BCPS Clinical Pharmacist 07/19/2020 10:51 AM   Please check [email or web address] for unit specific pharmacy phone numbers.

## 2020-07-19 NOTE — Progress Notes (Signed)
Removed 18 staples from sternal incision.  Ordered every one staple removed. Patient tolerated well. Pt resting with call bell within reach.  Will continue to monitor.

## 2020-07-19 NOTE — Progress Notes (Signed)
      [address]       [place] [address]             [phone]        4 Days Post-Op Procedure(s) (LRB): REMOVAL OF IMPELLA LEFT VENTRICULAR ASSIST DEVICE (N/A)  Subjective: Voice remains weak, up walking around in his room independently on RA. Says he is feeling stronger, has no complaints.   Objective: Vital signs in last 24 hours: Temp:  [97.6 F (36.4 C)-98.1 F (36.7 C)] 98 F (36.7 C) (10/25 0454) Pulse Rate:  [83-103] 100 (10/25 0454) Cardiac Rhythm: Sinus tachycardia (10/24 1927) Resp:  [18-26] 20 (10/25 0454) BP: (101-115)/(69-86) 111/70 (10/25 0454) SpO2:  [95 %-97 %] 97 % (10/25 0454) Weight:  [66.5 kg] 66.5 kg (10/25 0500)  Pre op weight 71 kg Current Weight  07/19/20 66.5 kg    Intake/Output from previous day: 10/24 0701 - 10/25 0700 In: 840 [P.O.:840] Out: 400 [Urine:400]   Physical Exam:  Cardiovascular: SR, 100/min, no murmur Pulmonary: Clear to auscultation bilaterally, CXR shows small left effusion /atx Abdomen: Soft, non tender, bowel sounds present. Extremities: No lower extremity edema. Motor/sensory intact LUE  Wounds: Sternal and LUE wounds are clean and dry.  No erythema or signs of infection. Aquacel removed from right anterior axillary wound and wound is clean and dry.  Lab Results: CBC: Recent Labs    07/17/20 0132 07/19/20 0530  WBC 15.8* 16.5*  HGB 9.9* 11.2*  HCT 31.0* 36.3*  PLT 328 483*   BMET:  Recent Labs    07/18/20 1050 07/19/20 0530  NA 138 140  K 3.7 3.5  CL 105 108  CO2 23 22  GLUCOSE 98 109*  BUN 15 16  CREATININE 0.87 0.92  CALCIUM 8.6* 8.9    PT/INR:  Lab Results  Component Value Date   INR 2.1 (H) 07/18/2020   INR 2.3 (H) 07/09/2020   INR 2.2 (H) 07/08/2020   ABG:  INR: Will add last result for INR, ABG once components are confirmed Will add last 4 CBG results once components are confirmed  Assessment/Plan:  1. CV - Previous a fib with RVR. Maintaining SR with HR at  100.  On Amiodarone 200 mg bid, Digoxin 0.125 mg daily, CoOx 54.7 today.  Entresto bid, and Coumadin (history of LV thrombus).  INR is pending; pharmacy dosing Coumadin. 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 2.  Pulmonary - On room air. Small left effusion.  3. Acute systolic heart failure- HF team managing medications, considering adding coreg. On Lasix 40 mg daily and Spironolactone 12.5 mg daily  4.  Expected post op acute blood loss anemia - H and H trending up.  5.  Deconditioned-continue with PT/OT/cardiac rehab. Mobility improving.  6. Disposition- plan eventual discharge to home, coordinate discharge planning with HF team.   Cecille Amsterdam. RoddenberryPA-C [phone] 07/19/2020,8:00 AM

## 2020-07-19 NOTE — Progress Notes (Signed)
CARDIAC REHAB PHASE I   Went to offer to walk with pt, pt in BR. Ambulated this am with PT. Will f/u.  Reynold Bowen, RN BSN 07/19/2020 10:15 AM

## 2020-07-19 NOTE — Progress Notes (Signed)
CARDIAC REHAB PHASE I   PRE:  Rate/Rhythm: 108 ST  BP:  Sitting: 99/67      SaO2: 97 RA  MODE:  Ambulation: 470 ft   POST:  Rate/Rhythm: 118 ST  BP:  Sitting: 105/75    SaO2: 96 RA   Pt ambulated 488ft in hallway standby assist with steady gait. Pt denies pain, dizziness, or SOB. Pt returned to recliner. Pt impressed by his progress. Able to demonstrate 1250 on IS. Encouraged continued IS use and ambulation. Reinforced sternal precautions. Will continue to follow.  [id number] Jerry Bowen, RN BSN 07/19/2020 11:15 AM

## 2020-07-19 NOTE — Progress Notes (Signed)
Pt demonstrating modified independence in self care. Reinforced sternal precautions related to ADL and IADL. Completed R UE exercise, adding wrist extension place and hold. Began education in energy conservation strategies. Pt receptive to all education. He is eager to go home.    07/19/20 1300  OT Visit Information  Last OT Received On 07/19/20  Assistance Needed +1  History of Present Illness HPI: 49 y/o male s/p recent back surgery, admitted 06/25/20 with chest pain found to have acute inferior STEMI. Also found to have acute distal RCA occlusion. Presented for CABG + MV repair adn placement of Impella 10/13. Back to OR 10/14 with large hematoma obstructing R atrium. Cadiogenic shock and acute hypoxic respiratory failure. Impella removed 10/21.Pt intubated 06/27/20-07/13/20.  Precautions  Precautions Sternal  Precaution Comments able to state sternal precautions, but needs cues to generalize  Pain Assessment  Pain Assessment No/denies pain  Cognition  Arousal/Alertness Awake/alert  Behavior During Therapy WFL for tasks assessed/performed  Overall Cognitive Status Impaired/Different from baseline  Area of Impairment Memory  Memory Decreased recall of precautions  Upper Extremity Assessment  RUE Coordination decreased fine motor  ADL  Grooming Modified independent;Standing  Lower Body Dressing Modified independent;Sitting/lateral leans  Toilet Transfer Modified Independent;Ambulation  Toileting- Clothing Manipulation and Hygiene Modified independent;Sit to/from stand (with L hand)  Functional mobility during ADLs Modified independent (in room)  General ADL Comments continued educating pt in sternal precautions related to ADL and IADL  Bed Mobility  Overal bed mobility Modified Independent  General bed mobility comments HOB up  Transfers  Overall transfer level Modified independent  Equipment used None  General transfer comment pt has been walking about his room without assist   Exercises  Exercises Other exercises  Other Exercises  Other Exercises Educated on shoulder rolls - ant/post x 10; 3x/day  Other Exercises gentle scapular retraction x 10  Other Exercises  towel slides x 10 into FF x 10 withelbow close to body  Other Exercises wrist extension place and hold x 5  OT - End of Session  Activity Tolerance Patient tolerated treatment well  Patient left in bed;with call bell/phone within reach  OT Assessment/Plan  OT Plan Discharge plan remains appropriate  OT Visit Diagnosis Unsteadiness on feet (R26.81);Muscle weakness (generalized) (M62.81);Other symptoms and signs involving cognitive function;Pain  OT Frequency (ACUTE ONLY) Min 2X/week  Follow Up Recommendations Outpatient OT;Supervision/Assistance - 24 hour  OT Equipment 3 in 1 bedside commode  AM-PAC OT "6 Clicks" Daily Activity Outcome Measure (Version 2)  Help from another person eating meals? 4  Help from another person taking care of personal grooming? 4  Help from another person toileting, which includes using toliet, bedpan, or urinal? 4  Help from another person bathing (including washing, rinsing, drying)? 4  Help from another person to put on and taking off regular upper body clothing? 4  Help from another person to put on and taking off regular lower body clothing? 4  6 Click Score 24  OT Goal Progression  Progress towards OT goals Progressing toward goals  Acute Rehab OT Goals  Patient Stated Goal go home  OT Goal Formulation With patient  Time For Goal Achievement 07/30/20  Potential to Achieve Goals Good  OT Time Calculation  OT Start Time (ACUTE ONLY) 1215  OT Stop Time (ACUTE ONLY) 1240  OT Time Calculation (min) 25 min  OT General Charges  $OT Visit 1 Visit  OT Treatments  $Self Care/Home Management  8-22 mins  $Therapeutic Exercise 8-22 mins  Nestor Lewandowsky, OTR/L Acute Rehabilitation Services Pager: [phone] Office: [phone]

## 2020-07-19 NOTE — Progress Notes (Signed)
Physical Therapy Treatment Patient Details Name: Jerry [name] MRN: [id number] DOB: [birth year] Today's Date: 07/19/2020    History of Present Illness HPI: 49 y/o male s/p recent back surgery, admitted 06/25/20 with chest pain found to have acute inferior STEMI. Also found to have acute distal RCA occlusion. Presented for CABG + MV repair adn placement of Impella 10/13. Back to OR 10/14 with large hematoma obstructing R atrium. Cadiogenic shock and acute hypoxic respiratory failure. Impella removed 10/21.Pt intubated 06/27/20-07/13/20.    PT Comments    Patient progressing well towards PT goals. Reports feeling better today with some mild discomfort and soreness at incisions. Tolerated gait training with Min guard assist for safety due to increased speed and drifting. Pt with 2-3/4 DOE. Pre activity BP 103/76, post activity BP 118/72, HR 106-126 bpm with activity. Tolerated stair training with Min guard assist; reports some weakness in LEs. Performed 5xSTS in 9.71 seconds (normative values for age is 7.6 +/- 1.8 sec) indicated decreased functional strength, impaired balance and fall risk. Noted to have some cognitive deficits relating to awareness and overall safety. Encouraged walking 2 more times today. Will follow.    Follow Up Recommendations  Home health PT;Supervision/Assistance - 24 hour     Equipment Recommendations  None recommended by PT    Recommendations for Other Services       Precautions / Restrictions Precautions Precautions: Sternal Precaution Booklet Issued: Yes (comment) Precaution Comments: Pt able to state precautions Restrictions Weight Bearing Restrictions: Yes (sternal precautions)    Mobility  Bed Mobility               General bed mobility comments: Up in chair upon PT arrival.  Transfers Overall transfer level: Needs assistance Equipment used: None Transfers: Sit to/from Stand Sit to Stand: Supervision         General transfer comment:  Supervision for safety. Stood from chair x6 without use of UEs.  Ambulation/Gait Ambulation/Gait assistance: Min guard Gait Distance (Feet): 550 Feet Assistive device: None Gait Pattern/deviations: Step-through pattern;Decreased stride length;Drifts right/left Gait velocity: fast paced Gait velocity interpretation: >2.62 ft/sec, indicative of community ambulatory General Gait Details: Fast paced gait with 2-3/4 DOE. Needs pacing cues. Some mild drifting noted but no overt LOB.   Stairs Stairs: Yes Stairs assistance: Min guard Stair Management: Step to pattern;Alternating pattern;One rail Left Number of Stairs: 6 General stair comments: Cues for technique and safety   Wheelchair Mobility    Modified Rankin (Stroke Patients Only)       Balance Overall balance assessment: Needs assistance Sitting-balance support: No upper extremity supported;Feet supported Sitting balance-Leahy Scale: Good     Standing balance support: During functional activity Standing balance-Leahy Scale: Fair Standing balance comment: Able to move dynamically without UE support but needs cues for safety/decrease speed                            Cognition Arousal/Alertness: Awake/alert Behavior During Therapy: WFL for tasks assessed/performed Overall Cognitive Status: Impaired/Different from baseline Area of Impairment: Awareness;Safety/judgement                         Safety/Judgement: Decreased awareness of safety Awareness: Emergent   General Comments: A&Ox4, reports he is feeling better. Hoarse voice but able to vocalize louder with effort but not sustain. Acknowledges that he is somewhat unsteady. needs cues to pace self during mobility.      Exercises  General Comments General comments (skin integrity, edema, etc.): pre activity BP103/76, post activity BP118/72, HR 106-126 bpm with activity.      Pertinent Vitals/Pain Pain Assessment: Faces Faces Pain Scale:  Hurts little more Pain Location: incisions Pain Descriptors / Indicators: Discomfort;Burning Pain Intervention(s): Monitored during session;Repositioned    Home Living                      Prior Function            PT Goals (current goals can now be found in the care plan section) Progress towards PT goals: Progressing toward goals    Frequency    Min 3X/week      PT Plan Current plan remains appropriate    Co-evaluation              AM-PAC PT "6 Clicks" Mobility   Outcome Measure  Help needed turning from your back to your side while in a flat bed without using bedrails?: A Little Help needed moving from lying on your back to sitting on the side of a flat bed without using bedrails?: A Little Help needed moving to and from a bed to a chair (including a wheelchair)?: None Help needed standing up from a chair using your arms (e.g., wheelchair or bedside chair)?: None Help needed to walk in hospital room?: A Little Help needed climbing 3-5 steps with a railing? : A Little 6 Click Score: 20    End of Session Equipment Utilized During Treatment: Gait belt Activity Tolerance: Patient tolerated treatment well;Patient limited by fatigue Patient left: in chair;with call bell/phone within reach Nurse Communication: Mobility status PT Visit Diagnosis: Muscle weakness (generalized) (M62.81);Pain Pain - part of body:  (incisions)     Time: 0802-0820 PT Time Calculation (min) (ACUTE ONLY): 18 min  Charges:  $Gait Training: 8-22 mins                     Vale Haven, PT, DPT Acute Rehabilitation Services Pager [phone] Office [phone]       Blake Divine A Lanier Ensign 07/19/2020, 9:10 AM

## 2020-07-19 NOTE — Progress Notes (Signed)
Mobility Specialist: Progress Note  See other note.   [facility]  Mobility Specialist

## 2020-07-20 DIAGNOSIS — I5022 Chronic systolic (congestive) heart failure: Secondary | ICD-10-CM | POA: Diagnosis not present

## 2020-07-20 LAB — CBC
HCT: 37.2 % — ABNORMAL LOW (ref 39.0–52.0)
Hemoglobin: 12 g/dL — ABNORMAL LOW (ref 13.0–17.0)
MCH: 29.7 pg (ref 26.0–34.0)
MCHC: 32.3 g/dL (ref 30.0–36.0)
MCV: 92.1 fL (ref 80.0–100.0)
Platelets: 584 10*3/uL — ABNORMAL HIGH (ref 150–400)
RBC: 4.04 MIL/uL — ABNORMAL LOW (ref 4.22–5.81)
RDW: 15.6 % — ABNORMAL HIGH (ref 11.5–15.5)
WBC: 17.5 10*3/uL — ABNORMAL HIGH (ref 4.0–10.5)
nRBC: 0 % (ref 0.0–0.2)

## 2020-07-20 LAB — BASIC METABOLIC PANEL
Anion gap: 9 (ref 5–15)
BUN: 22 mg/dL — ABNORMAL HIGH (ref 6–20)
CO2: 20 mmol/L — ABNORMAL LOW (ref 22–32)
Calcium: 9.1 mg/dL (ref 8.9–10.3)
Chloride: 109 mmol/L (ref 98–111)
Creatinine, Ser: 0.8 mg/dL (ref 0.61–1.24)
GFR, Estimated: >60 mL/min (ref >60)
Glucose, Bld: 106 mg/dL — ABNORMAL HIGH (ref 70–99)
Potassium: 3.9 mmol/L (ref 3.5–5.1)
Sodium: 138 mmol/L (ref 135–145)

## 2020-07-20 LAB — PROTIME-INR
INR: 1.7 — ABNORMAL HIGH (ref 0.8–1.2)
Prothrombin Time: 18.9 seconds — ABNORMAL HIGH (ref 11.4–15.2)

## 2020-07-20 MED ORDER — WARFARIN SODIUM 2 MG PO TABS: 3.0000 mg | ORAL_TABLET | Freq: Once | ORAL | Status: DC

## 2020-07-20 MED ORDER — WARFARIN SODIUM 4 MG PO TABS
4.0000 mg | ORAL_TABLET | Freq: Once | ORAL | Status: AC
  Administered 2020-07-20: 4 mg via ORAL
  Filled 2020-07-20: qty 1

## 2020-07-20 NOTE — TOC Benefit Eligibility Note (Signed)
Transition of Care [facility]) Benefit Eligibility Note    Patient Details  Name: Jerry [name] MRN: [id number] Date of Birth: May 12, 1971   Medication/Dose: Sherryll Burger 24-26mg  bid  Covered?: Yes  Tier: 3 Drug  Prescription Coverage Preferred Pharmacy: [facility]  Spoke with Person/Company/Phone Number:: Tammy G. W/[employer] Help Desk  Co-Pay: $50.00  Prior Approval: Yes [phone])  Deductible:  (No Deductible)       Renie Ora Phone Number: 07/20/2020, 12:44 PM

## 2020-07-20 NOTE — Discharge Instructions (Signed)
Discharge Instructions:  1. You may shower, please wash incisions daily with soap and water and keep dry.  If you wish to cover wounds with dressing you may do so but please keep clean and change daily.  No tub baths or swimming until incisions have completely healed.  If your incisions become red or develop any drainage please call our office at [phone]  2. No Driving until cleared by Dr. Sunday Corn office and you are no longer using narcotic pain medications  3. Monitor your weight daily.. Please use the same scale and weigh at same time... If you gain 5-10 lbs in 48 hours with associated lower extremity swelling, please contact our office at [phone]  4. Fever of 101.5 for at least 24 hours with no source, please contact our office at [phone]  5. Activity- up as tolerated, please walk at least 3 times per day.  Avoid strenuous activity, no lifting, pushing, or pulling with your arms over 8-10 lbs for a minimum of 6 weeks  6. If any questions or concerns arise, please do not hesitate to contact our office at [phone]  Information on my medicine - Coumadin   (Warfarin)  Why was Coumadin prescribed for you? Coumadin was prescribed for you because you have a blood clot or a medical condition that can cause an increased risk of forming blood clots. Blood clots can cause serious health problems by blocking the flow of blood to the heart, lung, or brain. Coumadin can prevent harmful blood clots from forming. As a reminder your indication for Coumadin is:   Stroke Prevention Because Of Atrial Fibrillation  What test will check on my response to Coumadin? While on Coumadin (warfarin) you will need to have an INR test regularly to ensure that your dose is keeping you in the desired range. The INR (international normalized ratio) number is calculated from the result of the laboratory test called prothrombin time (PT).  If an INR APPOINTMENT HAS NOT ALREADY BEEN MADE FOR YOU  please schedule an appointment to have this lab work done by your health care provider within 7 days. Your INR goal is usually a number between:  2 to 3 or your provider may give you a more narrow range like 2-2.5.  Ask your health care provider during an office visit what your goal INR is.  What  do you need to  know  About  COUMADIN? Take Coumadin (warfarin) exactly as prescribed by your healthcare provider about the same time each day.  DO NOT stop taking without talking to the doctor who prescribed the medication.  Stopping without other blood clot prevention medication to take the place of Coumadin may increase your risk of developing a new clot or stroke.  Get refills before you run out.  What do you do if you miss a dose? If you miss a dose, take it as soon as you remember on the same day then continue your regularly scheduled regimen the next day.  Do not take two doses of Coumadin at the same time.  Important Safety Information A possible side effect of Coumadin (Warfarin) is an increased risk of bleeding. You should call your healthcare provider right away if you experience any of the following: ? Bleeding from an injury or your nose that does not stop. ? Unusual colored urine (red or dark brown) or unusual colored stools (red or black). ? Unusual bruising for unknown reasons. ? A serious fall or if you hit your head (even if there  is no bleeding).  Some foods or medicines interact with Coumadin (warfarin) and might alter your response to warfarin. To help avoid this: ? Eat a balanced diet, maintaining a consistent amount of Vitamin K. ? Notify your provider about major diet changes you plan to make. ? Avoid alcohol or limit your intake to 1 drink for women and 2 drinks for men per day. (1 drink is 5 oz. wine, 12 oz. beer, or 1.5 oz. liquor.)  Make sure that ANY health care provider who prescribes medication for you knows that you are taking Coumadin (warfarin).  Also make sure the  healthcare provider who is monitoring your Coumadin knows when you have started a new medication including herbals and non-prescription products.  Coumadin (Warfarin)  Major Drug Interactions  Increased Warfarin Effect Decreased Warfarin Effect  Alcohol (large quantities) Antibiotics (esp. Septra/Bactrim, Flagyl, Cipro) Amiodarone (Cordarone) Aspirin (ASA) Cimetidine (Tagamet) Megestrol (Megace) NSAIDs (ibuprofen, naproxen, etc.) Piroxicam (Feldene) Propafenone (Rythmol SR) Propranolol (Inderal) Isoniazid (INH) Posaconazole (Noxafil) Barbiturates (Phenobarbital) Carbamazepine (Tegretol) Chlordiazepoxide (Librium) Cholestyramine (Questran) Griseofulvin Oral Contraceptives Rifampin Sucralfate (Carafate) Vitamin K   Coumadin (Warfarin) Major Herbal Interactions  Increased Warfarin Effect Decreased Warfarin Effect  Garlic Ginseng Ginkgo biloba Coenzyme Q10 Green tea St. John's wort    Coumadin (Warfarin) FOOD Interactions  Eat a consistent number of servings per week of foods HIGH in Vitamin K (1 serving =  cup)  Collards (cooked, or boiled & drained) Kale (cooked, or boiled & drained) Mustard greens (cooked, or boiled & drained) Parsley *serving size only =  cup Spinach (cooked, or boiled & drained) Swiss chard (cooked, or boiled & drained) Turnip greens (cooked, or boiled & drained)  Eat a consistent number of servings per week of foods MEDIUM-HIGH in Vitamin K (1 serving = 1 cup)  Asparagus (cooked, or boiled & drained) Broccoli (cooked, boiled & drained, or raw & chopped) Brussel sprouts (cooked, or boiled & drained) *serving size only =  cup Lettuce, raw (green leaf, endive, romaine) Spinach, raw Turnip greens, raw & chopped   These websites have more information on Coumadin (warfarin):  http://www.king-russell.com/; https://www.hines.net/;  Heart Healthy Nutrition Therapy   A heart-healthy diet is recommended to manage heart disease. To follow a  heart-healthy . Eat a balanced diet with whole grains, fruits and vegetables, and lean protein sources.  . Choose heart-healthy unsaturated fats. Limit saturated fats, trans fats, and cholesterol intake. Eat more plant-based or vegetarian meals using beans and soy foods for protein.  . Eat whole, unprocessed foods to limit the amount of sodium (salt) you eat.  . Choose a consistent amount of carbohydrate at each meal and snack. Limit refined carbohydrates especially sugar, sweets and sugar-sweetened beverages.  . If you drink alcohol, do so in moderation: one serving per day (women) and two servings per day (men). o One serving is equivalent to 12 ounces beer, 5 ounces wine, or 1.5 ounces distilled spirits  Tips Tips for Choosing Heart-Healthy Fats Choose lean protein and low-fat dairy foods to reduce saturated fat intake. . Saturated fat is usually found in animal-based protein and is associated with certain health risks. Saturated fat is the biggest contributor to raise low-density lipoprotein (LDL) cholesterol levels. Research shows that limiting saturated fat lowers unhealthy cholesterol levels. Eat no more than 7% of your total calories each day from saturated fat. Ask your RDN to help you determine how much saturated fat is right for you. . There are many foods that do not contain large amounts of saturated  fats. Swapping these foods to replace foods high in saturated fats will help you limit the saturated fat you eat and improve your cholesterol levels. You can also try eating more plant-based or vegetarian meals. Instead of. Try:  Whole milk, cheese, yogurt, and ice cream 1% or skim milk, low-fat cheese, non-fat yogurt, and low-fat ice cream  Fatty, marbled beef and pork Lean beef, pork, or venison  Poultry with skin Poultry without skin  Butter, stick margarine Reduced-fat, whipped, or liquid spreads  Coconut oil, palm oil Liquid vegetable oils: corn, canola, olive, soybean and safflower  oils   Avoid foods that contain trans fats. . Trans fats increase levels of LDL-cholesterol. Hydrogenated fat in processed foods is the main source of trans fats in foods.  . Trans fats can be found in stick margarine, shortening, processed sweets, baked goods, some fried foods, and packaged foods made with hydrogenated oils. Avoid foods with "partially hydrogenated oil" on the ingredient list such as: cookies, pastries, baked goods, biscuits, crackers, microwave popcorn, and frozen dinners. Choose foods with heart healthy fats. . Polyunsaturated and monounsaturated fat are unsaturated fats that may help lower your blood cholesterol level when used in place of saturated fat in your diet. . Ask your RDN about taking a dietary supplement with plant sterols and stanols to help lower your cholesterol level. Marland Kitchen Research shows that substituting saturated fats with unsaturated fats is beneficial to cholesterol levels. Try these easy swaps: Instead of. Try:  Butter, stick margarine, or solid shortening Reduced-fat, whipped, or liquid spreads  Beef, pork, or poultry with skin Fish and seafood  Chips, crackers, snack foods Raw or unsalted nuts and seeds or nut butters Hummus with vegetables Avocado on toast  Coconut oil, palm oil Liquid vegetable oils: corn, canola, olive, soybean and safflower oils  Limit the amount of cholesterol you eat to less than 200 milligrams per day. . Cholesterol is a substance carried through the bloodstream via lipoproteins, which are known as "transporters" of fat. Some body functions need cholesterol to work properly, but too much cholesterol in the bloodstream can damage arteries and build up blood vessel linings (which can lead to heart attack and stroke). You should eat less than 200 milligrams cholesterol per day. Marland Kitchen People respond differently to eating cholesterol. There is no test available right now that can figure out which people will respond more to dietary cholesterol  and which will respond less. For individuals with high intake of dietary cholesterol, different types of increase (none, small, moderate, large) in LDL-cholesterol levels are all possible.  . Food sources of cholesterol include egg yolks and organ meats such as liver, gizzards. Limit egg yolks to two to four per week and avoid organ meats like liver and gizzards to control cholesterol intake. Tips for Choosing Heart-Healthy Consume a consistent amount of carbohydrate . It is important to eat foods with carbohydrates in moderation because they impact your blood glucose level. Carbohydrates can be found in many foods such as: . Grains (breads, crackers, rice, pasta, and cereals)  . Starchy Vegetables (potatoes, corn, and peas)  . Beans and legumes  . Milk, soy milk, and yogurt  . Fruit and fruit juice  . Sweets (cakes, cookies, ice cream, jam and jelly) . Your RDN will help you set a goal for how many carbohydrate servings to eat at your meals and snacks. For many adults, eating 3 to 5 servings of carbohydrate foods at each meal and 1 or 2 carbohydrate servings for each snack  works well.  . Check your blood glucose level regularly. It can tell you if you need to adjust when you eat carbohydrates. . Choose foods rich in viscous (soluble) fiber . Viscous, or soluble, is found in the walls of plant cells. Viscous fiber is found only in plant-based foods. Eating foods with fiber helps to lower your unhealthy cholesterol and keep your blood glucose in range  . Rich sources of viscous fiber include vegetables (asparagus, Brussels sprouts, sweet potatoes, turnips) fruit (apricots, mangoes, oranges), legumes, and whole grains (barley, oats, and oat bran).  . As you increase your fiber intake gradually, also increase the amount of water you drink. This will help prevent constipation.  . If you have difficulty achieving this goal, ask your RDN about fiber laxatives. Choose fiber supplements made with viscous  fibers such as psyllium seed husks or methylcellulose to help lower unhealthy cholesterol.  . Limit refined carbohydrates  . There are three types of carbohydrates: starches, sugar, and fiber. Some carbohydrates occur naturally in food, like the starches in rice or corn or the sugars in fruits and milk. Refined carbohydrates--foods with high amounts of simple sugars--can raise triglyceride levels. High triglyceride levels are associated with coronary heart disease. . Some examples of refined carbohydrate foods are table sugar, sweets, and beverages sweetened with added sugar. Tips for Reducing Sodium (Salt) Although sodium is important for your body to function, too much sodium can be harmful for people with high blood pressure. As sodium and fluid buildup in your tissues and bloodstream, your blood pressure increases. High blood pressure may cause damage to other organs and increase your risk for a stroke. Even if you take a pill for blood pressure or a water pill (diuretic) to remove fluid, it is still important to have less salt in your diet. Ask your doctor and RDN what amount of sodium is right for you. Marland Kitchen Avoid processed foods. Eat more fresh foods.  . Fresh fruits and vegetables are naturally low in sodium, as well as frozen vegetables and fruits that have no added juices or sauces.  . Fresh meats are lower in sodium than processed meats, such as bacon, sausage, and hotdogs. Read the nutrition label or ask your butcher to help you find a fresh meat that is low in sodium. . Eat less salt--at the table and when cooking.  . A single teaspoon of table salt has 2,300 mg of sodium.  . Leave the salt out of recipes for pasta, casseroles, and soups.  . Ask your RDN how to cook your favorite recipes without sodium . Be a Engineer, building services.  . Look for food packages that say "salt-free" or "sodium-free." These items contain less than 5 milligrams of sodium per serving.  Marland Kitchen "Very low-sodium" products contain  less than 35 milligrams of sodium per serving.  Marland Kitchen "Low-sodium" products contain less than 140 milligrams of sodium per serving.  . Beware for "Unsalted" or "No Added Salt" products. These items may still be high in sodium. Check the nutrition label. . Add flavors to your food without adding sodium.  . Try lemon juice, lime juice, fruit juice or vinegar.  . Dry or fresh herbs add flavor. Try basil, bay leaf, dill, rosemary, parsley, sage, dry mustard, nutmeg, thyme, and paprika.  . Pepper, red pepper flakes, and cayenne pepper can add spice t your meals without adding sodium. Hot sauce contains sodium, but if you use just a drop or two, it will not add up to much.  Marland Kitchen  Buy a sodium-free seasoning blend or make your own at home. Additional Lifestyle Tips Achieve and maintain a healthy weight. . Talk with your RDN or your doctor about what is a healthy weight for you. . Set goals to reach and maintain that weight.  . To lose weight, reduce your calorie intake along with increasing your physical activity. A weight loss of 10 to 15 pounds could reduce LDL-cholesterol by 5 milligrams per deciliter. Participate in physical activity. . Talk with your health care team to find out what types of physical activity are best for you. Set a plan to get about 30 minutes of exercise on most days.  Foods Recommended Food Group Foods Recommended  Grains Whole grain breads and cereals, including whole wheat, barley, rye, buckwheat, corn, teff, quinoa, millet, amaranth, brown or wild rice, sorghum, and oats Pasta, especially whole wheat or other whole grain types  [employer] [place] [employer], quinoa or wild rice Whole grain crackers, bread, rolls, pitas Home-made bread with reduced-sodium baking soda  Protein Foods Lean cuts of beef and pork (loin, leg, round, extra lean hamburger)  Skinless Press photographer and other wild game Dried beans and peas Nuts and nut butters Meat alternatives made with soy or textured vegetable  protein  Egg whites or egg substitute Cold cuts made with lean meat or soy protein  Dairy Nonfat (skim), low-fat, or 1%-fat milk  Nonfat or low-fat yogurt or cottage cheese Fat-free and low-fat cheese  Vegetables Fresh, frozen, or canned vegetables without added fat or salt   Fruits Fresh, frozen, canned, or dried fruit   Oils Unsaturated oils (corn, olive, peanut, soy, sunflower, canola)  Soft or liquid margarines and vegetable oil spreads  Salad dressings Seeds and nuts  Avocado   Foods Not Recommended Food Group Foods Not Recommended  Grains Breads or crackers topped with salt Cereals (hot or cold) with more than 300 mg sodium per serving Biscuits, cornbread, and other "quick" breads prepared with baking soda Bread crumbs or stuffing mix from a store High-fat bakery products, such as doughnuts, biscuits, croissants, danish pastries, pies, cookies Instant cooking foods to which you add hot water and stir--potatoes, noodles, rice, etc. Packaged starchy foods--seasoned noodle or rice dishes, stuffing mix, macaroni and cheese dinner Snacks made with partially hydrogenated oils, including chips, cheese puffs, snack mixes, regular crackers, butter-flavored popcorn  Protein Foods Higher-fat cuts of meats (ribs, t-bone steak, regular hamburger) Bacon, sausage, or hot dogs Cold cuts, such as salami or bologna, deli meats, cured meats, corned beef Organ meats (liver, brains, gizzards, sweetbreads) Poultry with skin Fried or smoked meat, poultry, and fish Whole eggs and egg yolks (more than 2-4 per week) Salted legumes, nuts, seeds, or nut/seed butters Meat alternatives with high levels of sodium (>300 mg per serving) or saturated fat (>5 g per serving)  Dairy Whole milk,?2% fat milk, buttermilk Whole milk yogurt or ice cream Cream Half-&-half Cream cheese Sour cream Cheese  Vegetables Canned or frozen vegetables with salt, fresh vegetables prepared with salt, butter, cheese, or cream  sauce Fried vegetables Pickled vegetables such as olives, pickles, or sauerkraut  Fruits Fried fruits Fruits served with butter or cream  Oils Butter, stick margarine, shortening Partially hydrogenated oils or trans fats Tropical oils (coconut, palm, palm kernel oils)  Other Candy, sugar sweetened soft drinks and desserts Salt, sea salt, garlic salt, and seasoning mixes containing salt Bouillon cubes Ketchup, barbecue sauce, Worcestershire sauce, soy sauce, teriyaki sauce Miso Salsa Pickles, olives, relish   Heart Healthy Vegetarian (  Lacto-Ovo) Sample 1-Day Menu  Breakfast 1 cup oatmeal, cooked (2 carbohydrate servings)   cup blueberries (1 carbohydrate serving)  11 almonds, without salt  1 cup 1% milk (1 carbohydrate serving)  1 cup coffee  Morning Snack 1 cup fat-free plain yogurt (1 carbohydrate serving)  Lunch 1 whole wheat bun (1 carbohydrate servings)  1 black bean burger (1 carbohydrate servings)  1 slice cheddar cheese, low sodium  2 slices tomatoes  2 leaves lettuce  1 teaspoon mustard  1 small pear (1 carbohydrate servings)  1 cup green tea, unsweetened  Afternoon Snack 1/3 cup trail mix with nuts, seeds, and raisins, without salt (1 carbohydrate servinga)  Evening Meal  cup meatless chicken  2/3 cup brown rice, cooked (2 carbohydrate servings)  1 cup broccoli, cooked (2/3 carbohydrate serving)   cup carrots, cooked (1/3 carbohydrate serving)  2 teaspoons olive oil  1 teaspoon balsamic vinegar  1 whole wheat dinner roll (1 carbohydrate serving)  1 teaspoon margarine, soft, tub  1 cup 1% milk (1 carbohydrate serving)  Evening Snack 1 extra small banana (1 carbohydrate serving)  1 tablespoon peanut butter   Heart Healthy Vegan Sample 1-Day Menu  Breakfast 1 cup oatmeal, cooked (2 carbohydrate servings)   cup blueberries (1 carbohydrate serving)  11 almonds, without salt  1 cup soymilk fortified with calcium, vitamin B12, and vitamin D  1 cup coffee   Morning Snack 6 ounces soy yogurt (1 carbohydrate servings)  Lunch 1 whole wheat bun(1 carbohydrate servings)  1 black bean burger (1 carbohydrate serving)  2 slices tomatoes  2 leaves lettuce  1 teaspoon mustard  1 small pear (1 carbohydrate servings)  1 cup green tea, unsweetened  Afternoon Snack 1/3 cup trail mix with nuts, seeds, and raisins, without salt (1 carbohydrate servings)  Evening Meal  cup meatless chicken  2/3 cup brown rice, cooked (2 carbohydrate servings)  1 cup broccoli, cooked (2/3 carbohydrate serving)   cup carrots, cooked (1/3 carbohydrate serving)  2 teaspoons olive oil  1 teaspoon balsamic vinegar  1 whole wheat dinner roll (1 carbohydrate serving)  1 teaspoon margarine, soft, tub  1 cup soymilk fortified with calcium, vitamin B12, and vitamin D  Evening Snack 1 extra small banana (1 carbohydrate serving)  1 tablespoon peanut butter    Heart Healthy Sample 1-Day Menu  Breakfast 1 cup cooked oatmeal (2 carbohydrate servings)  3/4 cup blueberries (1 carbohydrate serving)  1 ounce almonds  1 cup skim milk (1 carbohydrate serving)  1 cup coffee  Morning Snack 1 cup sugar-free nonfat yogurt (1 carbohydrate serving)  Lunch 2 slices whole-wheat bread (2 carbohydrate servings)  2 ounces lean [place] breast  1 ounce low-fat Swiss cheese  1 teaspoon mustard  1 slice tomato  1 lettuce leaf  1 small pear (1 carbohydrate serving)  1 cup skim milk (1 carbohydrate serving)  Afternoon Snack 1 ounce trail mix with unsalted nuts, seeds, and raisins (1 carbohydrate serving)  Evening Meal 3 ounces salmon  2/3 cup cooked brown rice (2 carbohydrate servings)  1 teaspoon soft margarine  1 cup cooked broccoli with 1/2 cup cooked carrots (1 carbohydrate serving  Carrots, cooked, boiled, drained, without salt  1 cup lettuce  1 teaspoon olive oil with vinegar for dressing  1 small whole grain roll (1 carbohydrate serving)  1 teaspoon soft margarine  1 cup  unsweetened tea  Evening Snack 1 extra-small banana (1 carbohydrate serving)  Copyright 2020  [employer]. All rights  reserved.

## 2020-07-20 NOTE — Progress Notes (Signed)
Physical Therapy Treatment Patient Details Name: Jerry [name] MRN: [id number] DOB: 1971-03-21 Today's Date: 07/20/2020    History of Present Illness HPI: 49 y/o male s/p recent back surgery, admitted 06/25/20 with chest pain found to have acute inferior STEMI. Also found to have acute distal RCA occlusion. Presented for CABG + MV repair adn placement of Impella 10/13. Back to OR 10/14 with large hematoma obstructing R atrium. Cadiogenic shock and acute hypoxic respiratory failure. Impella removed 10/21.Pt intubated 06/27/20-07/13/20.    PT Comments    Pt supine in bed on arrival this session.  Pt tolerated session well.  He moves very quick paced and eager to return home.  Pt performed stair training with decreased assistance and postural exercises at edge of bed.     Follow Up Recommendations  Home health PT;Supervision/Assistance - 24 hour     Equipment Recommendations  None recommended by PT    Recommendations for Other Services       Precautions / Restrictions Precautions Precautions: Sternal Precaution Booklet Issued: Yes (comment) Precaution Comments: able to state sternal precautions, but needs cues to generalize Restrictions Weight Bearing Restrictions: No (sternal precautions)    Mobility  Bed Mobility Overal bed mobility: Modified Independent Bed Mobility: Rolling;Sidelying to Sit Rolling: Modified independent (Device/Increase time) Sidelying to sit: Modified independent (Device/Increase time)       General bed mobility comments: HOB up  Transfers Overall transfer level: Independent Equipment used: None Transfers: Sit to/from Stand Sit to Stand: Independent            Ambulation/Gait Ambulation/Gait assistance: Independent Gait Distance (Feet): 200 Feet Assistive device: None Gait Pattern/deviations: Trunk flexed Gait velocity: fast paced   General Gait Details: Cues for scap retraction to improve posture.   Stairs Stairs: Yes   Stair  Management: Step to pattern;Alternating pattern;Forwards;One rail Left Number of Stairs: 6 General stair comments: Supervision for safety   Wheelchair Mobility    Modified Rankin (Stroke Patients Only)       Balance Overall balance assessment: Needs assistance Sitting-balance support: No upper extremity supported;Feet supported Sitting balance-Leahy Scale: Normal       Standing balance-Leahy Scale: Normal                              Cognition Arousal/Alertness: Awake/alert Behavior During Therapy: WFL for tasks assessed/performed Overall Cognitive Status: Impaired/Different from baseline                                 General Comments: A&Ox4, reports he is feeling better. Hoarse voice but able to vocalize louder with effort but not sustain. Acknowledges that he is somewhat unsteady. needs cues to pace self during mobility.      Exercises Other Exercises Other Exercises: Cervical flexion and extension 1x10 reps. Other Exercises: Cervical rotation to R and L x10 reps. Other Exercises: Dorsal extension 1x10 reps. Other Exercises: B Shoulder elevation 1x10 reps. Other Exercises: IS 1x10 reps. [id number] ml    General Comments        Pertinent Vitals/Pain Pain Assessment: 0-10 Faces Pain Scale: Hurts little more Pain Location: incisions    Home Living                      Prior Function            PT Goals (current goals can now be found in the  care plan section) Acute Rehab PT Goals Patient Stated Goal: go home Potential to Achieve Goals: Good Progress towards PT goals: Progressing toward goals    Frequency    Min 3X/week      PT Plan Current plan remains appropriate    Co-evaluation              AM-PAC PT "6 Clicks" Mobility   Outcome Measure  Help needed turning from your back to your side while in a flat bed without using bedrails?: None Help needed moving from lying on your back to sitting on the side  of a flat bed without using bedrails?: None Help needed moving to and from a bed to a chair (including a wheelchair)?: None Help needed standing up from a chair using your arms (e.g., wheelchair or bedside chair)?: None Help needed to walk in hospital room?: None Help needed climbing 3-5 steps with a railing? : None 6 Click Score: 24    End of Session   Activity Tolerance: Patient tolerated treatment well Patient left: in bed;with call bell/phone within reach;with family/visitor present Nurse Communication: Mobility status PT Visit Diagnosis: Muscle weakness (generalized) (M62.81);Pain Pain - part of body:  (incisions)     Time: 0762-2633 PT Time Calculation (min) (ACUTE ONLY): 8 min  Charges:  $Therapeutic Activity: 8-22 mins                     Bonney Leitz , PTA Acute Rehabilitation Services Pager [phone] Office [phone]      Artis Delay 07/20/2020, 3:02 PM

## 2020-07-20 NOTE — Progress Notes (Signed)
Nutrition Follow-up  DOCUMENTATION CODES:   Non-severe (moderate) malnutrition in context of acute illness/injury  INTERVENTION:    Ensure Enlive po TID, each supplement provides 350 kcal and 20 grams of protein  NUTRITION DIAGNOSIS:   Moderate Malnutrition related to acute illness (recent back surgery) as evidenced by mild muscle depletion, moderate muscle depletion, mild fat depletion, moderate fat depletion.  Ongoing  GOAL:   Patient will meet greater than or equal to 90% of their needs  Progressing   MONITOR:   PO intake, Supplement acceptance, Diet advancement, Labs, Weight trends, Skin, I & O's  REASON FOR ASSESSMENT:   Ventilator, Consult Enteral/tube feeding initiation and management  ASSESSMENT:   49 yo male admitted with STEMI with LV thrombus. S/P aspiration thrombectomy and balloon angioplasty 10/1. No significant PMH.  10/06 - extubated 10/13 - s/p CABG x 4, MV repair, impella 10/14 - return to OR for large hematoma obstructing R atrium 10/17 - trickle tube feeds initiated 10/19 - extubated  Diet advanced on 10/23. Meal completions charted as 50-100%. Pt taking Ensure Plus TID and Premier protein from home TID. Dicussed stopping premier protein while admitted to allow for more meal intake. Briefly dicussed heart healthy diet and how to make changes at home. Handout left in D/c.   Current weight: 77.2 kg Admit weight: 64.8 kg   Medications: 40 mg lasix daily, 20 mEq KCl BD, aldactone Labs: CBG 98-109  Diet Order:   Diet Order            Diet heart healthy/carb modified Room service appropriate? Yes; Fluid consistency: Thin  Diet effective now                 EDUCATION NEEDS:   Not appropriate for education at this time  Skin:  Skin Assessment: Skin Integrity Issues: Incisions: open vertebral column from recent back surgery, left arm, right leg, chest  Last BM:  10/25  Height:   Ht Readings from Last 1 Encounters:  07/07/20 5\' 8"   (1.727 m)    Weight:   Wt Readings from Last 1 Encounters:  07/20/20 64.8 kg    Ideal Body Weight:  70 kg  BMI:  Body mass index is 21.72 kg/m.  Estimated Nutritional Needs:   Kcal:  2200-2400  Protein:  110-130 grams  Fluid:  >/= 2 L  07/22/20 RD, LDN Clinical Nutrition Pager listed in AMION

## 2020-07-20 NOTE — Progress Notes (Addendum)
ANTICOAGULATION CONSULT NOTE - Follow Up Consult  Pharmacy Consult for Warfarin Indication: LV thrombus / AFib  Labs: Recent Labs    07/18/20 1050 07/19/20 0530 07/19/20 0947 07/20/20 0207  HGB  --  11.2*  --  12.0*  HCT  --  36.3*  --  37.2*  PLT  --  483*  --  584*  LABPROT 22.5*  --  20.6* 18.9*  INR 2.1*  --  1.8* 1.7*  CREATININE 0.87 0.92  --  0.80    Assessment: 49 YOM admitted with STEMI with concern for LV thrombus on ECHO 10/2 previously treated with heparin. Pharmacy has been consulted to dose warfarin. Not continuing heparin to minimize bleed risk (lumbar microdiscetomy 9/29).  Baseline INR on admission 1.2. INR has dropped below goal at 1.7 this morning. Last LFTs elevated (10/20 AST 66, ALT 368) and recently started on amiodarone. No bleeding noted.   Goal of Therapy:  INR 2-3   Plan:  Warfarin 4mg  PO x1 tonight Daily INR, CBC   PharmD., BCPS Clinical Pharmacist 07/20/2020 2:20 PM   Please check [email or web address] for unit specific pharmacy phone numbers.

## 2020-07-20 NOTE — Progress Notes (Signed)
CARDIAC REHAB PHASE I   Offered to walk with pt. Pt walking independently without difficulty. Pt states poor appetite, encouraged nutrition as able. Pt anxious for d/c, waiting on INR. Pt states his girlfirend and his mom will be with him after discharge. Pt denies further needs at this time. Encouraged continued ambulation as IS use. Will continue to follow.  [id number] Reynold Bowen, RN BSN 07/20/2020 10:08 AM

## 2020-07-20 NOTE — Progress Notes (Signed)
Patient ambulating in hallway independently with family . , Randall An RN

## 2020-07-20 NOTE — Progress Notes (Addendum)
Patient ID: [name], male   DOB: [birth year], 49 y.o.   MRN: [id number] P    Advanced Heart Failure Rounding Note  PCP-Cardiologist: Tonny Bollman, MD    Patient Profile   49 y/o male s/p recent back surgery, admitted for acute inferior and anterolateral MI. LHC showed thrombotic occlusion of distal RCA that was treated with PTCA and thrombectomy.  He was also noted to have chronic total occlusion of the LAD with collaterals from the right (thus this territory was affected by the RCA MI) as well as 90% complex proximal LCx stenosis. EF 25-30% + apical thrombus. RV normal. Course b/c cardiogenic shock and acute hypoxic respiratory failure. ? Component of septic shock. Once stabilized, underwent CABG.  - CABG + MV repair and placement of Impella 5.5 on 10/13.  - Back to OR on 10/14 with large hematoma obstructing right atrium.  - Extubated 10/19  - S/P Impella removal 10/21   Subjective:    Co-ox pending.   Persistent leukocytosis but AF and no signs of infection. Atelectasis noted on CXR. He has been using IS.    Feels good. Has been ambulating. No CP or dyspnea. Hopes to go home soon.    Objective:   Weight Range: 64.8 kg Body mass index is 21.72 kg/m.   Vital Signs:   Temp:  [97.7 F (36.5 C)-98.7 F (37.1 C)] 97.7 F (36.5 C) (10/26 0411) Pulse Rate:  [93-107] 93 (10/26 0411) Resp:  [20] 20 (10/26 0411) BP: (93-121)/(65-76) 93/68 (10/26 0411) SpO2:  [96 %-100 %] 98 % (10/26 0411) Weight:  [64.8 kg] 64.8 kg (10/26 0411) Last BM Date: 07/19/20  Weight change: Filed Weights   07/18/20 0455 07/19/20 0500 07/20/20 0411  Weight: 69.1 kg 66.5 kg 64.8 kg    Intake/Output:   Intake/Output Summary (Last 24 hours) at 07/20/2020 0741 Last data filed at 07/19/2020 1700 Gross per 24 hour  Intake 240 ml  Output --  Net 240 ml      Physical Exam   General: Well appearing, out of bed and ambulating around room. No resp difficulty HEENT: normal Neck: supple. no  JVD. Carotids 2+ bilat; no bruits. No lymphadenopathy or thryomegaly appreciated. Cor: PMI nondisplaced. Regular rate & rhythm. No rubs, gallops or murmurs. Sternotomy site stable  Lungs: clear, no wheezing  Abdomen: soft, nontender, nondistended. No hepatosplenomegaly. No bruits or masses. Good bowel sounds. Extremities: no cyanosis, clubbing, rash, edema.  Neuro: alert & orientedx3, cranial nerves grossly intact. moves all 4 extremities w/o difficulty. Affect pleasant   Telemetry   Sinus Tach low 100s. Personally reviewed.   Labs    CBC Recent Labs    07/19/20 0530 07/20/20 0207  WBC 16.5* 17.5*  HGB 11.2* 12.0*  HCT 36.3* 37.2*  MCV 92.6 92.1  PLT 483* 584*   Basic Metabolic Panel Recent Labs    37/04/88 0530 07/20/20 0207  NA 140 138  K 3.5 3.9  CL 108 109  CO2 22 20*  GLUCOSE 109* 106*  BUN 16 22*  CREATININE 0.92 0.80  CALCIUM 8.9 9.1   Liver Function Tests No results for input(s): AST, ALT, ALKPHOS, BILITOT, PROT, ALBUMIN in the last 72 hours. No results for input(s): LIPASE, AMYLASE in the last 72 hours. Cardiac Enzymes No results for input(s): CKTOTAL, CKMB, CKMBINDEX, TROPONINI in the last 72 hours.  BNP: BNP (last 3 results) Recent Labs    06/25/20 2334 06/26/20 0150  BNP 349.1* 368.4*    ProBNP (last 3 results) No results  for input(s): PROBNP in the last 8760 hours.   D-Dimer No results for input(s): DDIMER in the last 72 hours. Hemoglobin A1C No results for input(s): HGBA1C in the last 72 hours. Fasting Lipid Panel No results for input(s): CHOL, HDL, LDLCALC, TRIG, CHOLHDL, LDLDIRECT in the last 72 hours. Thyroid Function Tests No results for input(s): TSH, T4TOTAL, T3FREE, THYROIDAB in the last 72 hours.  Invalid input(s): FREET3  Other results:   Imaging    No results found.   Medications:     Scheduled Medications: . amiodarone  200 mg Oral BID  . aspirin EC  325 mg Oral Daily  . atorvastatin  80 mg Per Tube Daily  .  carvedilol  3.125 mg Oral BID WC  . chlorhexidine  15 mL Mouth Rinse BID  . Chlorhexidine Gluconate Cloth  6 each Topical Daily  . colchicine  0.6 mg Oral Daily  . [place] [facility], Patient & Family Education   Does not apply Once  . coumadin book   Does not apply Once  . digoxin  0.125 mg Oral Daily  . feeding supplement  237 mL Oral TID BM  . furosemide  40 mg Oral Daily  . mouth rinse  15 mL Mouth Rinse q12n4p  . pantoprazole  40 mg Oral QAC breakfast  . potassium chloride  20 mEq Oral BID  . sacubitril-valsartan  1 tablet Oral BID  . sodium chloride flush  3 mL Intravenous Q12H  . spironolactone  25 mg Oral Daily  . traZODone  100 mg Oral QHS  . Warfarin - Pharmacist Dosing Inpatient   Does not apply q1600    Infusions: . sodium chloride      PRN Medications: sodium chloride, acetaminophen, ALPRAZolam, ondansetron **OR** ondansetron (ZOFRAN) IV, sodium chloride flush, traMADol    Assessment/Plan   1. CAD: Late presentation inferior MI.  Cath showed thrombotic occlusion distal RCA, CTO LAD with collaterals from right, and complex 90% proximal LCx stenosis.  He had PTCA/thrombectomy RCA with good flow at end of procedure.  Suspect he had damage to LAD territory as well as RCA territory as RCA collateralized the LAD.  Now s/p CABG with LIMA-LAD, left radial to OM, seq SVG-PDA/PLV - ASA/statin. Can drop aspirin to 81 mg once INR therapeutic.  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 Suspect post-MI pericarditis. Symptoms resolved prior to surgery.  Recurrent friction rub post-surgery, now resolved.  - Continue colchicine 0.6 mg daily. Plan to continue for total 3 months.  3. Acute systolic CHF>>Cardiogenic Shock: Echo with EF 25-30%, RV ok (no evidence for RV infarct), LV thrombus.  Repeat bedside echo 06/27/20  EF 25%, normal RV w/ no pericardial effusion, no LV thrombus seen on this  echo. Post-op shock, had Impella 5.5 in place.  Had to return to OR on 10/14 with large pericardial space hematoma obstructing the right atrium, hemodynamics much better since removal. S/P Impella removal 10/21. He is now off milrinone. Volume status looks ok.  - Co-ox pending  - Continue spironolactone 25 mg daily.   - Continue digoxin 0.125 mg  - Continue Entresto 24/26 bid.  - Continue coreg 3.125 mg twice a day.  3. AF with RVR: Noted 10/12, now in NSR on amiodarone.  - In Sinus Tach. Continue po amiodarone - Now on warfarin, heparin gtt was stopped.  5. LV thrombus: Resolved on 10/3 echo.  - Continue warfarin,  dosing per pharmacy.  INR 1.7 today  6. ID: Initial concern for RLL PNA. Completed 7 days vancomycin/cefepime.  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  Now afebrile.  - He is now off ceftazidime.  7. Thrombocytopenia: Post-op. Resolved. 8. S/p back surgery: Micro-discectomy just prior to admission.  Neurosurgery has seen.   9. Possible benzo/narcotic withdrawal: Concern GF brought him drugs pre-op.  10. Pericardial hematoma: Obstructed RA post-CABG, had to go back to OR on 10/14.   11. Anemia: Post-op blood loss.  - Transfuse to keep hgb > 8. Stable today.  12. Acute hypoxic respiratory failure.   - extubated 10/19  13. Transaminitis:  Likely shock liver, LFTs trending down.    Jerry Lis PA-C  07/20/2020 7:41 AM  Agree with the above note.    He is stable today, no complaints.  SBP in 90s, will not titrate meds at this time.  Continue warfarin, when INR>2, decrease ASA to 81 mg daily.   Jerry [name] 07/20/2020

## 2020-07-20 NOTE — Care Management (Signed)
Entresto 30 day free card and copay reduction card given to and explained to patient and S.O. at bedside. Verbalized understanding. Benefit check pending

## 2020-07-20 NOTE — TOC Transition Note (Signed)
Transition of Care [facility]) - CM/SW Discharge Note   Patient Details  Name: Jerry [name] MRN: [id number] Date of Birth: [birth year]  Transition of [facility]) CM/SW Contact:  Lawerance Sabal, RN Phone Number: 07/20/2020, 4:03 PM   Clinical Narrative:   Spoke with patient and he declined HH and OP services.     Final next level of care: Home/Self Care Barriers to Discharge: Continued Medical Work up   Patient Goals and CMS Choice        Discharge Placement                       Discharge Plan and Services                          HH Arranged: Patient Refused [facility]          Social Determinants of Health (SDOH) Interventions     Readmission Risk Interventions No flowsheet data found.

## 2020-07-20 NOTE — Progress Notes (Addendum)
      [address]       [place] [address]             [phone]        5 Days Post-Op Procedure(s) (LRB): REMOVAL OF IMPELLA LEFT VENTRICULAR ASSIST DEVICE (N/A)  Subjective: Voice is a little stronger today, up walking around in his room independently on RA. Says he is feels well and has no concerns.   Objective: Vital signs in last 24 hours: Temp:  [97.1 F (36.2 C)-98 F (36.7 C)] 97.1 F (36.2 C) (10/26 0743) Pulse Rate:  [93-101] 99 (10/26 0743) Cardiac Rhythm: Normal sinus rhythm (10/26 0701) Resp:  [20] 20 (10/26 0743) BP: (93-121)/(65-76) 95/67 (10/26 0743) SpO2:  [96 %-100 %] 97 % (10/26 0743) Weight:  [64.8 kg] 64.8 kg (10/26 0411)  Pre op weight 71 kg Current Weight  07/20/20 64.8 kg    Intake/Output from previous day: 10/25 0701 - 10/26 0700 In: 240 [P.O.:240] Out: -    Physical Exam:  Cardiovascular: SR,  ~100/min, no murmur Pulmonary: Clear to auscultation bilaterally Abdomen: Soft, non tender, bowel sounds present. Extremities: No lower extremity edema. Motor/sensory intact LUE  Wounds: Sternal and LUE wounds are clean and dry.  No erythema or signs of infection. Every other staple removed from the sternal incision.    Lab Results: CBC: Recent Labs    07/19/20 0530 07/20/20 0207  WBC 16.5* 17.5*  HGB 11.2* 12.0*  HCT 36.3* 37.2*  PLT 483* 584*   BMET:  Recent Labs    07/19/20 0530 07/20/20 0207  NA 140 138  K 3.5 3.9  CL 108 109  CO2 22 20*  GLUCOSE 109* 106*  BUN 16 22*  CREATININE 0.92 0.80  CALCIUM 8.9 9.1    PT/INR:  Lab Results  Component Value Date   INR 1.7 (H) 07/20/2020   INR 1.8 (H) 07/19/2020   INR 2.1 (H) 07/18/2020   ABG:  INR: Will add last result for INR, ABG once components are confirmed Will add last 4 CBG results once components are confirmed  Assessment/Plan:  1. CV - Previous a fib with RVR. Maintaining SR with HR at 100.  On Amiodarone 200 mg bid, Digoxin 0.125 mg daily,  CoOx 54.7 today.  Entresto bid, and Coumadin (history of LV thrombus).  INR is 1.7 today; pharmacy dosing Coumadin. 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 2.  Pulmonary - On room air. Small left effusion.  3. Acute systolic heart failure- HF team managing medications, coreg added yesterday. On Lasix 40 mg daily, Entresto 24/26 and Spironolactone 12.5 mg daily  4.  Expected post op acute blood loss anemia - H and H trending up.  5.  Deconditioned-continue with PT/OT/cardiac rehab. Mobility improving.  6. Leukocytosis- not febrile, no sns of infection. CXR in AM to f/u pleural effusion / ATX.  7. Disposition- plan eventual discharge to home, coordinate discharge planning with HF team.   Dale Hewlett [phone] 07/20/2020,8:26 AM    Looks great Agree with assessment and plan as outlined above INR subtherapeutic WBC persistently elevated, no fevers, incisions look good, will dc PICC line  Viviann Spare C. Dorris Fetch, MD [facility] [phone]

## 2020-07-21 ENCOUNTER — Inpatient Hospital Stay (HOSPITAL_COMMUNITY): Payer: 59

## 2020-07-21 ENCOUNTER — Other Ambulatory Visit (HOSPITAL_COMMUNITY): Payer: Self-pay | Admitting: Physician Assistant

## 2020-07-21 LAB — BASIC METABOLIC PANEL
Anion gap: 10 (ref 5–15)
BUN: 22 mg/dL — ABNORMAL HIGH (ref 6–20)
CO2: 21 mmol/L — ABNORMAL LOW (ref 22–32)
Calcium: 9.2 mg/dL (ref 8.9–10.3)
Chloride: 107 mmol/L (ref 98–111)
Creatinine, Ser: 0.94 mg/dL (ref 0.61–1.24)
GFR, Estimated: >60 mL/min (ref >60)
Glucose, Bld: 116 mg/dL — ABNORMAL HIGH (ref 70–99)
Potassium: 4.1 mmol/L (ref 3.5–5.1)
Sodium: 138 mmol/L (ref 135–145)

## 2020-07-21 LAB — PROTIME-INR
INR: 2.4 — ABNORMAL HIGH (ref 0.8–1.2)
Prothrombin Time: 25.7 seconds — ABNORMAL HIGH (ref 11.4–15.2)

## 2020-07-21 MED ORDER — ATORVASTATIN CALCIUM 80 MG PO TABS
80.0000 mg | ORAL_TABLET | Freq: Every day | ORAL | 2 refills | Status: DC
Start: 2020-07-22 — End: 2020-10-12

## 2020-07-21 MED ORDER — SACUBITRIL-VALSARTAN 24-26 MG PO TABS
1.0000 | ORAL_TABLET | Freq: Two times a day (BID) | ORAL | 2 refills | Status: DC
Start: 2020-07-21 — End: 2020-09-28

## 2020-07-21 MED ORDER — ENSURE ENLIVE PO LIQD
237.0000 mL | Freq: Three times a day (TID) | ORAL | 12 refills | Status: DC
Start: 2020-07-21 — End: 2020-08-02

## 2020-07-21 MED ORDER — DIGOXIN 125 MCG PO TABS
0.1250 mg | ORAL_TABLET | Freq: Every day | ORAL | 2 refills | Status: DC
Start: 2020-07-22 — End: 2020-09-29

## 2020-07-21 MED ORDER — ASPIRIN 81 MG PO TBEC
81.0000 mg | DELAYED_RELEASE_TABLET | Freq: Every day | ORAL | 11 refills | Status: DC
Start: 2020-07-22 — End: 2022-07-11

## 2020-07-21 MED ORDER — COLCHICINE 0.6 MG PO TABS
0.6000 mg | ORAL_TABLET | Freq: Every day | ORAL | 2 refills | Status: DC
Start: 2020-07-22 — End: 2020-09-28

## 2020-07-21 MED ORDER — AMIODARONE HCL 200 MG PO TABS
200.0000 mg | ORAL_TABLET | Freq: Two times a day (BID) | ORAL | 2 refills | Status: DC
Start: 2020-07-21 — End: 2020-08-02

## 2020-07-21 MED ORDER — ACETAMINOPHEN 325 MG PO TABS: 650.0000 mg | ORAL_TABLET | Freq: Four times a day (QID) | ORAL | Status: DC | PRN

## 2020-07-21 MED ORDER — WARFARIN SODIUM 2.5 MG PO TABS
2.5000 mg | ORAL_TABLET | Freq: Every day | ORAL | 2 refills | Status: DC
Start: 2020-07-21 — End: 2020-10-12

## 2020-07-21 MED ORDER — TRAMADOL HCL 50 MG PO TABS: 50.0000 mg | ORAL_TABLET | Freq: Four times a day (QID) | ORAL | 0 refills | Status: DC | PRN

## 2020-07-21 MED ORDER — WARFARIN SODIUM 2.5 MG PO TABS: 2.5000 mg | ORAL_TABLET | Freq: Every day | ORAL | Status: DC

## 2020-07-21 MED ORDER — CARVEDILOL 3.125 MG PO TABS
3.1250 mg | ORAL_TABLET | Freq: Two times a day (BID) | ORAL | 2 refills | Status: DC
Start: 2020-07-21 — End: 2020-08-25

## 2020-07-21 MED ORDER — SPIRONOLACTONE 25 MG PO TABS
25.0000 mg | ORAL_TABLET | Freq: Every day | ORAL | 2 refills | Status: DC
Start: 2020-07-22 — End: 2020-10-12

## 2020-07-21 MED ORDER — ASPIRIN EC 81 MG PO TBEC
81.0000 mg | DELAYED_RELEASE_TABLET | Freq: Every day | ORAL | Status: DC
  Administered 2020-07-21: 81 mg via ORAL
  Filled 2020-07-21: qty 1

## 2020-07-21 MED FILL — ASPIRIN LOW DOSE 81 MG TBEC: 81 | 30 days supply | Qty: 30 | Fill #0

## 2020-07-21 MED FILL — CARVEDILOL 3.125 MG TABLET: 3.125 | 30 days supply | Qty: 60 | Fill #0

## 2020-07-21 MED FILL — SPIRONOLACTONE 25 MG TABLET: 25 | 30 days supply | Qty: 30 | Fill #0

## 2020-07-21 MED FILL — AMIODARONE HCL 200 MG TAB: 200 | 30 days supply | Qty: 60 | Fill #0

## 2020-07-21 MED FILL — ENTRESTO 24 MG-26 MG TABLET: 24-26 | 30 days supply | Qty: 60 | Fill #0

## 2020-07-21 MED FILL — traMADol HCL 50 MG TABS: 50 | 7 days supply | Qty: 28 | Fill #0

## 2020-07-21 MED FILL — COLCHICINE 0.6 MG TABS: 0.6 | 30 days supply | Qty: 30 | Fill #0

## 2020-07-21 MED FILL — DIGOXIN 0.125 MG TABLET: 125 | 30 days supply | Qty: 30 | Fill #0

## 2020-07-21 MED FILL — WARFARIN SODIUM 2.5 MG TAB: 2.5 | 30 days supply | Qty: 30 | Fill #0

## 2020-07-21 MED FILL — ATORVASTATIN CALCIUM 80 MG: 80 | 30 days supply | Qty: 30 | Fill #0

## 2020-07-21 NOTE — Progress Notes (Signed)
CARDIAC REHAB PHASE I   D/c education completed with pt and girlfriend. Pt educated on importance of site care and monitoring incision daily. Encouraged continued IS use, walks, and sternal precautions. Pt given in-the-tube sheet along with heart healthy diet. Encouraged daily weights and salt restrictions. Reviewed restrictions and exercise guidelines. Will refer to CRP II [place]. Pt grateful for the care he has received, and ready to go home.   [phone] Reynold Bowen, RN BSN 07/21/2020 9:06 AM

## 2020-07-21 NOTE — Progress Notes (Addendum)
      [address]       [place] [address]             [phone]        6 Days Post-Op Procedure(s) (LRB): REMOVAL OF IMPELLA LEFT VENTRICULAR ASSIST DEVICE (N/A)  Subjective: No new concerns, independent mobility.  He is very anxious to return home.    Objective: Vital signs in last 24 hours: Temp:  [96.5 F (35.8 C)-98.1 F (36.7 C)] 96.8 F (36 C) (10/27 0736) Pulse Rate:  [94-100] 100 (10/27 0720) Cardiac Rhythm: Normal sinus rhythm (10/27 0330) Resp:  [18-25] 19 (10/27 0736) BP: (92-106)/(68-73) 97/72 (10/27 0720) SpO2:  [96 %-100 %] 100 % (10/27 0720) Weight:  [63.5 kg] 63.5 kg (10/27 0542)  Pre op weight 71 kg Current Weight  07/21/20 63.5 kg    Intake/Output from previous day: 10/26 0701 - 10/27 0700 In: 480 [P.O.:480] Out: -    Physical Exam:  Cardiovascular:  Stable SR,  ~100/min, no murmur Pulmonary: Clear to auscultation bilaterally Abdomen: Soft, non tender, bowel sounds present. Extremities: No lower extremity edema. Motor/sensory intact LUE  Wounds: Sternal and LUE wounds are clean and dry.  No erythema or signs of infection. Every other staple removed from the sternal incision.    Lab Results: CBC: Recent Labs    07/19/20 0530 07/20/20 0207  WBC 16.5* 17.5*  HGB 11.2* 12.0*  HCT 36.3* 37.2*  PLT 483* 584*   BMET:  Recent Labs    07/20/20 0207 07/21/20 0016  NA 138 138  K 3.9 4.1  CL 109 107  CO2 20* 21*  GLUCOSE 106* 116*  BUN 22* 22*  CREATININE 0.80 0.94  CALCIUM 9.1 9.2    PT/INR:  Lab Results  Component Value Date   INR 2.4 (H) 07/21/2020   INR 1.7 (H) 07/20/2020   INR 1.8 (H) 07/19/2020   ABG:  INR: Will add last result for INR, ABG once components are confirmed Will add last 4 CBG results once components are confirmed  Assessment/Plan:  1. CV - Previous a fib with RVR. Maintaining SR with HR at  ~100.  On Amiodarone 200 mg bid, Digoxin 0.125 mg daily, CoOx 54.7 today.  Entresto bid, and  Coumadin (history of LV thrombus).  INR is 2.4 today; pharmacy dosing Coumadin. 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 2.  Pulmonary - On room air. Small left effusion.  3. Acute systolic heart failure- HF team managing medications On Lasix 40 mg daily, Entresto 24/26 and Spironolactone 12.5 mg daily  4.  Expected post op acute blood loss anemia - H and H stable 5.  Deconditioned- Mobility improving, he declined home PT.  6. Leukocytosis- not febrile, no sns of infection, PICC removed yesterday.  7. Disposition-  plan discharge to home when OK with HF team agrees.  Remove remaining staples from sternotomy and right chest incision.   Dale [place] [phone] 07/21/2020,7:50 AM   patient seen and examined, agree with above Ok for dc from a surgical standpoint Dc if Ok with heart failure team  Viviann Spare C. Dorris Fetch, MD [facility] [phone]

## 2020-07-21 NOTE — Progress Notes (Signed)
D/C instructions given to patient. Wound care and medications reviewed. All questions answered. IV removed, clean and intact. TOC to deliver medications to room. Girlfriend to escort pt home.  Versie Starks, RN

## 2020-07-21 NOTE — Progress Notes (Addendum)
Patient ID: Jerry [name], male   DOB: 04-29-1971, 49 y.o.   MRN: [id number] P    Advanced Heart Failure Rounding Note  PCP-Cardiologist: Tonny Bollman, MD    Patient Profile   49 y/o male s/p recent back surgery, admitted for acute inferior and anterolateral MI. LHC showed thrombotic occlusion of distal RCA that was treated with PTCA and thrombectomy.  He was also noted to have chronic total occlusion of the LAD with collaterals from the right (thus this territory was affected by the RCA MI) as well as 90% complex proximal LCx stenosis. EF 25-30% + apical thrombus. RV normal. Course b/c cardiogenic shock and acute hypoxic respiratory failure. ? Component of septic shock. Once stabilized, underwent CABG.  - CABG + MV repair and placement of Impella 5.5 on 10/13.  - Back to OR on 10/14 with large hematoma obstructing right atrium.  - Extubated 10/19  - S/P Impella removal 10/21   Subjective:    INR up 2.4 today.   Wants to go home. Denies shortness of breath.   Objective:   Weight Range: 63.5 kg Body mass index is 21.29 kg/m.   Vital Signs:   Temp:  [96.5 F (35.8 C)-98.1 F (36.7 C)] 96.8 F (36 C) (10/27 0736) Pulse Rate:  [94-100] 100 (10/27 0720) Resp:  [18-25] 19 (10/27 0736) BP: (92-106)/(68-73) 97/72 (10/27 0720) SpO2:  [96 %-100 %] 100 % (10/27 0720) Weight:  [63.5 kg] 63.5 kg (10/27 0542) Last BM Date: 07/19/20  Weight change: Filed Weights   07/19/20 0500 07/20/20 0411 07/21/20 0542  Weight: 66.5 kg 64.8 kg 63.5 kg    Intake/Output:   Intake/Output Summary (Last 24 hours) at 07/21/2020 0854 Last data filed at 07/21/2020 0721 Gross per 24 hour  Intake 600 ml  Output --  Net 600 ml      Physical Exam   General: In bed. No resp difficulty HEENT: normal Neck: supple. no JVD. Carotids 2+ bilat; no bruits. No lymphadenopathy or thryomegaly appreciated. Cor: PMI nondisplaced. Regular rate & rhythm. No rubs, gallops or murmurs. Lungs: clear Abdomen:  soft, nontender, nondistended. No hepatosplenomegaly. No bruits or masses. Good bowel sounds. Extremities: no cyanosis, clubbing, rash, edema. LUE incision approximated ecchymotic.   Neuro: alert & orientedx3, cranial nerves grossly intact. moves all 4 extremities w/o difficulty. Affect pleasant   Telemetry   SR 90s personally reviewed   Labs    CBC Recent Labs    07/19/20 0530 07/20/20 0207  WBC 16.5* 17.5*  HGB 11.2* 12.0*  HCT 36.3* 37.2*  MCV 92.6 92.1  PLT 483* 584*   Basic Metabolic Panel Recent Labs    26/37/85 0207 07/21/20 0016  NA 138 138  K 3.9 4.1  CL 109 107  CO2 20* 21*  GLUCOSE 106* 116*  BUN 22* 22*  CREATININE 0.80 0.94  CALCIUM 9.1 9.2   Liver Function Tests No results for input(s): AST, ALT, ALKPHOS, BILITOT, PROT, ALBUMIN in the last 72 hours. No results for input(s): LIPASE, AMYLASE in the last 72 hours. Cardiac Enzymes No results for input(s): CKTOTAL, CKMB, CKMBINDEX, TROPONINI in the last 72 hours.  BNP: BNP (last 3 results) Recent Labs    06/25/20 2334 06/26/20 0150  BNP 349.1* 368.4*    ProBNP (last 3 results) No results for input(s): PROBNP in the last 8760 hours.   D-Dimer No results for input(s): DDIMER in the last 72 hours. Hemoglobin A1C No results for input(s): HGBA1C in the last 72 hours. Fasting Lipid Panel No  results for input(s): CHOL, HDL, LDLCALC, TRIG, CHOLHDL, LDLDIRECT in the last 72 hours. Thyroid Function Tests No results for input(s): TSH, T4TOTAL, T3FREE, THYROIDAB in the last 72 hours.  Invalid input(s): FREET3  Other results:   Imaging    DG Chest 2 View  Result Date: 07/21/2020 CLINICAL DATA:  Pleural effusion on the left EXAM: CHEST - 2 VIEW COMPARISON:  Two days ago FINDINGS: Streaky opacity at the left base with small pleural effusion. The right lung is clear. Normal heart size and mediastinal contours. CABG. The left central line has been removed. Stable postoperative changes. IMPRESSION:  Left lower lobe opacity and small pleural effusion, mildly improved from 2 days ago. Electronically Signed   By: Marnee Spring M.D.   On: 07/21/2020 08:04     Medications:     Scheduled Medications:  amiodarone  200 mg Oral BID   aspirin EC  81 mg Oral Daily   atorvastatin  80 mg Per Tube Daily   carvedilol  3.125 mg Oral BID WC   chlorhexidine  15 mL Mouth Rinse BID   Chlorhexidine Gluconate Cloth  6 each Topical Daily   colchicine  0.6 mg Oral Daily   [facility], Patient & Family Education   Does not apply Once   coumadin book   Does not apply Once   digoxin  0.125 mg Oral Daily   feeding supplement  237 mL Oral TID BM   furosemide  40 mg Oral Daily   mouth rinse  15 mL Mouth Rinse q12n4p   pantoprazole  40 mg Oral QAC breakfast   potassium chloride  20 mEq Oral BID   sacubitril-valsartan  1 tablet Oral BID   sodium chloride flush  3 mL Intravenous Q12H   spironolactone  25 mg Oral Daily   traZODone  100 mg Oral QHS   Warfarin - Pharmacist Dosing Inpatient   Does not apply q1600    Infusions:  sodium chloride      PRN Medications: sodium chloride, acetaminophen, ALPRAZolam, ondansetron **OR** ondansetron (ZOFRAN) IV, sodium chloride flush, traMADol    Assessment/Plan   1. CAD: Late presentation inferior MI.  Cath showed thrombotic occlusion distal RCA, CTO LAD with collaterals from right, and complex 90% proximal LCx stenosis.  He had PTCA/thrombectomy RCA with good flow at end of procedure.  Suspect he had damage to LAD territory as well as RCA territory as RCA collateralized the LAD.  Now s/p CABG with LIMA-LAD, left radial to OM, seq SVG-PDA/PLV - ASA/statin.  - Change to asa 81 mg daily - INR therapeutic.   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 Suspect post-MI pericarditis. Symptoms resolved prior to surgery.  Recurrent friction rub post-surgery, now  resolved.  - Continue colchicine 0.6 mg daily. Plan to continue for total 3 months.  3. Acute systolic CHF>>Cardiogenic Shock: Echo with EF 25-30%, RV ok (no evidence for RV infarct), LV thrombus.  Repeat bedside echo 06/27/20  EF 25%, normal RV w/ no pericardial effusion, no LV thrombus seen on this echo. Post-op shock, had Impella 5.5 in place.  Had to return to OR on 10/14 with large pericardial space hematoma obstructing the right atrium, hemodynamics much better since removal. S/P Impella removal 10/21. He is now off milrinone.  - Stable from HF perspective.   - Continue spironolactone 25 mg daily.   - Continue digoxin 0.125 mg  - Continue  Entresto 24/26 bid.  - Continue coreg 3.125 mg twice a day.  - Can add SGT2i as an outpatient.  3. AF with RVR: Noted 10/12, now in NSR on amiodarone.  - In SR today. Continue amiodarone.  - On coumadin. INR therapuetic.  5. LV thrombus: Resolved on 10/3 echo.  - Continue warfarin, dosing per pharmacy.  INR 2.4 today  6. ID: Initial concern for RLL PNA. Completed 7 days vancomycin/cefepime.  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   7. Thrombocytopenia: Post-op. Resolved. 8. S/p back surgery: Micro-discectomy just prior to admission.  Neurosurgery has seen.   9. Possible benzo/narcotic withdrawal: Concern GF brought him drugs pre-op.  10. Pericardial hematoma: Obstructed RA post-CABG, had to go back to OR on 10/14.    11. Anemia: Post-op blood loss.  - Transfuse to keep hgb > 8. Stable today.  12. Acute hypoxic respiratory failure.   - extubated 10/19  13. Transaminitis:  Likely shock liver, LFTs trending down.    Ok for d/c today  Meds for d/c  Colchicine 0.6 mg daily ( total of 3 months- Stop date 10/12/20) Spironolactone 25 mg daily Coreg 3.125 mg twice a day Entresto 24-26 mg twice a day  Amiodarone 200 mg twice a day  Atorvastatin 80 mg daily Coumadin per pharmacy  ASA 81 mg daily   We will set up  follow up .  Amy Clegg NP-C  07/21/2020 8:54 AM  Patient seen with NP, agree with the above note.   He is doing well today, ready for discharge on the above medication regimen.  We will make followup for him in CHF clinic.   Marca Ancona 07/21/2020

## 2020-07-21 NOTE — Progress Notes (Signed)
   07/21/20 1000  OT Visit Information  Last OT Received On 07/21/20  Assistance Needed +1  History of Present Illness HPI: 49 y/o male s/p recent back surgery, admitted 06/25/20 with chest pain found to have acute inferior STEMI. Also found to have acute distal RCA occlusion. Presented for CABG + MV repair adn placement of Impella 10/13. Back to OR 10/14 with large hematoma obstructing R atrium. Cadiogenic shock and acute hypoxic respiratory failure. Impella removed 10/21.Pt intubated 06/27/20-07/13/20.  Precautions  Precautions Sternal  Precaution Comments reinforced sternal precautions  Pain Assessment  Pain Assessment No/denies pain  Faces Pain Scale 2  Pain Location incisions  Pain Descriptors / Indicators Sore  Pain Intervention(s) Monitored during session  Cognition  Arousal/Alertness Awake/alert  Behavior During Therapy WFL for tasks assessed/performed  Overall Cognitive Status Within Functional Limits for tasks assessed  Upper Extremity Assessment  Upper Extremity Assessment RUE deficits/detail  RUE Deficits / Details extends wrist to neutral  RUE Coordination decreased fine motor  ADL  Overall ADL's  Modified independent  Functional mobility during ADLs Independent  Bed Mobility  Overal bed mobility Modified Independent  Balance  Sitting balance-Leahy Scale Normal  Standing balance-Leahy Scale Normal  Transfers  Overall transfer level Independent  Equipment used None  OT - End of Session  Activity Tolerance Patient tolerated treatment well  Patient left in bed;with call bell/phone within reach  OT Assessment/Plan  OT Plan Discharge plan remains appropriate (pt declining OPOT)  OT Visit Diagnosis Muscle weakness (generalized) (M62.81);Pain  OT Frequency (ACUTE ONLY) Min 2X/week  Follow Up Recommendations Outpatient OT;Supervision/Assistance - 24 hour  OT Equipment 3 in 1 bedside commode  AM-PAC OT "6 Clicks" Daily Activity Outcome Measure (Version 2)  Help from  another person eating meals? 4  Help from another person taking care of personal grooming? 4  Help from another person toileting, which includes using toliet, bedpan, or urinal? 4  Help from another person bathing (including washing, rinsing, drying)? 4  Help from another person to put on and taking off regular upper body clothing? 4  Help from another person to put on and taking off regular lower body clothing? 4  6 Click Score 24  OT Goal Progression  Progress towards OT goals Progressing toward goals  Acute Rehab OT Goals  Patient Stated Goal go home  OT Goal Formulation With patient  Time For Goal Achievement 07/30/20  Potential to Achieve Goals Good  OT Time Calculation  OT Start Time (ACUTE ONLY) 1018  OT Stop Time (ACUTE ONLY) 1030  OT Time Calculation (min) 12 min  OT General Charges  $OT Visit 1 Visit  OT Treatments  $Therapeutic Activity 8-22 mins  Martie Round, OTR/L Acute Rehabilitation Services Pager: [phone] Office: [phone]

## 2020-07-21 NOTE — Progress Notes (Addendum)
ANTICOAGULATION CONSULT NOTE - Follow Up Consult  Pharmacy Consult for Warfarin Indication: LV thrombus / AFib  Labs: Recent Labs    07/18/20 1050 07/19/20 0530 07/19/20 0947 07/20/20 0207 07/21/20 0016  HGB  --  11.2*  --  12.0*  --   HCT  --  36.3*  --  37.2*  --   PLT  --  483*  --  584*  --   LABPROT   < >  --  20.6* 18.9* 25.7*  INR   < >  --  1.8* 1.7* 2.4*  CREATININE  --  0.92  --  0.80 0.94   < > = values in this interval not displayed.    Assessment: 43 YOM admitted with STEMI with concern for LV thrombus on ECHO 10/2 previously treated with heparin. Pharmacy has been consulted to dose warfarin. Not continuing heparin to minimize bleed risk (lumbar microdiscetomy 9/29).  Baseline INR on admission 1.2. INR has dropped to 1.7 yesterday, gave boosted dose now up to 2.4 this morning. Last LFTs elevated (10/20 AST 66, ALT 368) and recently started on amiodarone. No bleeding noted.   Goal of Therapy:  INR 2-3   Plan:  Warfarin 2.5mg  at discharge until seen by coumadin clinic on Friday afternoon Appt set by HF NP Daily INR, CBC   Sheppard Coil PharmD., BCPS Clinical Pharmacist 07/21/2020 9:15 AM   Please check [email or web address] for unit specific pharmacy phone numbers.

## 2020-07-27 ENCOUNTER — Other Ambulatory Visit: Payer: Self-pay

## 2020-07-27 ENCOUNTER — Ambulatory Visit (INDEPENDENT_AMBULATORY_CARE_PROVIDER_SITE_OTHER): Payer: 59 | Admitting: *Deleted

## 2020-07-27 DIAGNOSIS — Z5181 Encounter for therapeutic drug level monitoring: Secondary | ICD-10-CM | POA: Diagnosis not present

## 2020-07-27 DIAGNOSIS — I4891 Unspecified atrial fibrillation: Secondary | ICD-10-CM

## 2020-07-27 DIAGNOSIS — I824Y2 Acute embolism and thrombosis of unspecified deep veins of left proximal lower extremity: Secondary | ICD-10-CM | POA: Diagnosis not present

## 2020-07-27 DIAGNOSIS — I824Y9 Acute embolism and thrombosis of unspecified deep veins of unspecified proximal lower extremity: Secondary | ICD-10-CM | POA: Insufficient documentation

## 2020-07-27 LAB — POCT INR: INR: 6.7 — AB (ref 2.0–3.0)

## 2020-07-27 LAB — PROTIME-INR
INR: 6.2 (ref 0.9–1.2)
Prothrombin Time: 60.9 s — ABNORMAL HIGH (ref 9.1–12.0)

## 2020-07-27 NOTE — Patient Instructions (Addendum)
A full discussion of the nature of anticoagulants has been carried out.  A benefit risk analysis has been presented to the patient, so that they understand the justification for choosing anticoagulation at this time. The need for frequent and regular monitoring, precise dosage adjustment and compliance is stressed.  Side effects of potential bleeding are discussed.  The patient should avoid any OTC items containing aspirin or ibuprofen, and should avoid great swings in general diet.  Avoid alcohol consumption.  Call if any signs of abnormal bleeding.   Description   Stat INR back at 6.2. Called pt and left message @ 1:15pm. Spoke with pt and instructed pt to skip warfarin for 3 days, then start taking 1/2 tablet every day except 1 full tablet on Mondays and Thursdays. Recheck INR in 1 week (pt only available for visits on Tuesdays). Coumadin Clinic [phone]

## 2020-08-02 ENCOUNTER — Other Ambulatory Visit: Payer: Self-pay

## 2020-08-02 ENCOUNTER — Encounter (HOSPITAL_COMMUNITY): Payer: Self-pay

## 2020-08-02 ENCOUNTER — Ambulatory Visit (HOSPITAL_COMMUNITY)
Admission: RE | Admit: 2020-08-02 | Discharge: 2020-08-02 | Disposition: A | Discharge status: Home / Self Care | Payer: 59 | Source: Ambulatory Visit | Attending: Cardiology | Admitting: Cardiology

## 2020-08-02 VITALS — BP 102/69 | HR 94 | Wt 142.2 lb

## 2020-08-02 DIAGNOSIS — I252 Old myocardial infarction: Secondary | ICD-10-CM | POA: Diagnosis not present

## 2020-08-02 DIAGNOSIS — I5022 Chronic systolic (congestive) heart failure: Secondary | ICD-10-CM | POA: Insufficient documentation

## 2020-08-02 DIAGNOSIS — I5042 Chronic combined systolic (congestive) and diastolic (congestive) heart failure: Secondary | ICD-10-CM | POA: Diagnosis not present

## 2020-08-02 DIAGNOSIS — I255 Ischemic cardiomyopathy: Secondary | ICD-10-CM | POA: Diagnosis not present

## 2020-08-02 DIAGNOSIS — I340 Nonrheumatic mitral (valve) insufficiency: Secondary | ICD-10-CM | POA: Insufficient documentation

## 2020-08-02 DIAGNOSIS — I236 Thrombosis of atrium, auricular appendage, and ventricle as current complications following acute myocardial infarction: Secondary | ICD-10-CM

## 2020-08-02 DIAGNOSIS — Z951 Presence of aortocoronary bypass graft: Secondary | ICD-10-CM

## 2020-08-02 DIAGNOSIS — I2510 Atherosclerotic heart disease of native coronary artery without angina pectoris: Secondary | ICD-10-CM | POA: Diagnosis not present

## 2020-08-02 DIAGNOSIS — Z79899 Other long term (current) drug therapy: Secondary | ICD-10-CM | POA: Diagnosis not present

## 2020-08-02 DIAGNOSIS — Z7982 Long term (current) use of aspirin: Secondary | ICD-10-CM | POA: Diagnosis not present

## 2020-08-02 DIAGNOSIS — Z9889 Other specified postprocedural states: Secondary | ICD-10-CM | POA: Diagnosis not present

## 2020-08-02 DIAGNOSIS — Z7901 Long term (current) use of anticoagulants: Secondary | ICD-10-CM | POA: Insufficient documentation

## 2020-08-02 DIAGNOSIS — I4891 Unspecified atrial fibrillation: Secondary | ICD-10-CM | POA: Insufficient documentation

## 2020-08-02 DIAGNOSIS — I480 Paroxysmal atrial fibrillation: Secondary | ICD-10-CM

## 2020-08-02 DIAGNOSIS — I513 Intracardiac thrombosis, not elsewhere classified: Secondary | ICD-10-CM | POA: Diagnosis not present

## 2020-08-02 LAB — COMPREHENSIVE METABOLIC PANEL
ALT: 33 U/L (ref 0–44)
AST: 28 U/L (ref 15–41)
Albumin: 4 g/dL (ref 3.5–5.0)
Alkaline Phosphatase: 89 U/L (ref 38–126)
Anion gap: 9 (ref 5–15)
BUN: 21 mg/dL — ABNORMAL HIGH (ref 6–20)
CO2: 25 mmol/L (ref 22–32)
Calcium: 9.5 mg/dL (ref 8.9–10.3)
Chloride: 102 mmol/L (ref 98–111)
Creatinine, Ser: 0.98 mg/dL (ref 0.61–1.24)
GFR, Estimated: >60 mL/min (ref >60)
Glucose, Bld: 97 mg/dL (ref 70–99)
Potassium: 4.8 mmol/L (ref 3.5–5.1)
Sodium: 136 mmol/L (ref 135–145)
Total Bilirubin: 0.7 mg/dL (ref 0.3–1.2)
Total Protein: 7.3 g/dL (ref 6.5–8.1)

## 2020-08-02 LAB — LIPID PANEL
Cholesterol: 121 mg/dL (ref 0–200)
HDL: 34 mg/dL — ABNORMAL LOW (ref >40)
LDL Cholesterol: 59 mg/dL (ref 0–99)
Total CHOL/HDL Ratio: 3.6 RATIO
Triglycerides: 139 mg/dL (ref <150)
VLDL: 28 mg/dL (ref 0–40)

## 2020-08-02 LAB — CBC
HCT: 44.6 % (ref 39.0–52.0)
Hemoglobin: 13.8 g/dL (ref 13.0–17.0)
MCH: 29 pg (ref 26.0–34.0)
MCHC: 30.9 g/dL (ref 30.0–36.0)
MCV: 93.7 fL (ref 80.0–100.0)
Platelets: 253 10*3/uL (ref 150–400)
RBC: 4.76 MIL/uL (ref 4.22–5.81)
RDW: 15.6 % — ABNORMAL HIGH (ref 11.5–15.5)
WBC: 12.7 10*3/uL — ABNORMAL HIGH (ref 4.0–10.5)
nRBC: 0 % (ref 0.0–0.2)

## 2020-08-02 LAB — DIGOXIN LEVEL: Digoxin Level: 0.8 ng/mL — ABNORMAL LOW (ref 1.0–2.0)

## 2020-08-02 MED ORDER — DAPAGLIFLOZIN PROPANEDIOL 10 MG PO TABS: 10.0000 mg | ORAL_TABLET | Freq: Every day | ORAL | 11 refills | Status: DC

## 2020-08-02 MED ORDER — AMIODARONE HCL 200 MG PO TABS: 200.0000 mg | ORAL_TABLET | Freq: Every day | ORAL | 3 refills | Status: DC

## 2020-08-02 NOTE — Patient Instructions (Signed)
DECREASE Amiodarone to 200 mg one tab daily START Farxiga 10 mg, one tab daily   Labs today We will only contact you if something comes back abnormal or we need to make some changes. Otherwise no news is good news!  Labs needed in 7-10 days  Your physician recommends that you schedule a follow-up appointment in: 3 weeks with the pharmacy team, 6-8 weeks  in the Advanced Practitioners (PA/NP) Clinic, 4 months with Dr Shirlee Latch and echo  Your physician has requested that you have an echocardiogram. Echocardiography is a painless test that uses sound waves to create images of your heart. It provides your doctor with information about the size and shape of your heart and how well your heart's chambers and valves are working. This procedure takes approximately one hour. There are no restrictions for this procedure.   If you have any questions or concerns before your next appointment please send [place] a message through Wanda or call our office at [phone].    TO LEAVE A MESSAGE FOR THE NURSE SELECT OPTION 2, PLEASE LEAVE A MESSAGE INCLUDING: . YOUR NAME . DATE OF BIRTH . CALL BACK NUMBER . REASON FOR CALL**this is important as we prioritize the call backs  YOU WILL RECEIVE A CALL BACK THE SAME DAY AS LONG AS YOU CALL BEFORE 4:00 PM

## 2020-08-02 NOTE — Progress Notes (Signed)
Advanced Heart Failure Clinic Note   Referring Physician: PCP: Sigmund Hazel, MD PCP-Cardiologist: Tonny Bollman, MD  [facility]: Dr. Shirlee Latch  CT Surgery: Dr. Michelene Gardener F/u: S/p Acute MI c/b systolic heart failure/ cardiogenic shock, s/p CABG   HPI:   49 y/o male s/p recent back surgery 05/2020, admitted for acute inferior and anterolateral MI on 06/25/20. LHC showed thrombotic occlusion of distal RCA that was treated with PTCA and thrombectomy. He was also noted to have chronic total occlusion of the LAD with collaterals from the right (thus this territory was affected by the RCA MI) as well as 90% complex proximal LCx stenosis. EF 25-30% + apical thrombus and severe mitral regurgitation. RV normal. Also w/ post MI pericarditis, treated w/ colchicine. Course further c/b cardiogenic shock and acute hypoxic respiratory failure w/ component of septic shock 2/2 PNA, requiring intubation and treatment w/ abx. Once stabilized, he underwent CABG x 3 (LIMA-LAD, Lt radial-OM1 and sequential SVG-PDA/PLA) + MV repair and placement of Impella 5.5 on 10/13 by Dr. Dorris Fetch.  Post operatively, he developed tamponade and was taken back to the OR on 10/14 with large hematoma obstructing right atrium. He improved and was extubated 10/19.  Impella removal 10/21 w/ stable hemodynamics and able to tolerate initiation of GDMT.  Also had post operative afib treated w/ amio. He was discharged home on 10/27.   He presents to clinic today for post hospital f/u. Here w/ his girlfriend. Looks and feels well. Denies CP. No dyspnea at rest nor w/ basic ADLs. NYHA class II. EKG shows NSR. HR well controlled. Denies symptoms of breakthrough afib. Compliant w/ all meds including coumadin. INRs followed by coumadin clinic. Denies abnormal bleeding. Tolerating meds ok w/o orthostatic symptoms. BP soft but stable. Denies alcohol and tobacco use.   Review of systems complete and found to be negative unless listed in  HPI.      Past Medical History:  Diagnosis Date  . Anxiety   . Coronary artery disease   . Myocardial infarction [facility])     Current Outpatient Medications  Medication Sig Dispense Refill  . acetaminophen (TYLENOL) 325 MG tablet Take 2 tablets (650 mg total) by mouth every 6 (six) hours as needed for mild pain.    Marland Kitchen ALPRAZolam (XANAX) 0.5 MG tablet Take 0.5 mg by mouth at bedtime as needed for sleep.     Marland Kitchen amiodarone (PACERONE) 200 MG tablet Take 1 tablet (200 mg total) by mouth 2 (two) times daily. 60 tablet 2  . aspirin EC 81 MG EC tablet Take 1 tablet (81 mg total) by mouth daily. Swallow whole. 30 tablet 11  . atorvastatin (LIPITOR) 80 MG tablet Place 1 tablet (80 mg total) into feeding tube daily. 30 tablet 2  . carvedilol (COREG) 3.125 MG tablet Take 1 tablet (3.125 mg total) by mouth 2 (two) times daily with a meal. 60 tablet 2  . colchicine 0.6 MG tablet Take 1 tablet (0.6 mg total) by mouth daily. 30 tablet 2  . digoxin (LANOXIN) 0.125 MG tablet Take 1 tablet (0.125 mg total) by mouth daily. 30 tablet 2  . Multiple Vitamins-Minerals (CENTRUM ULTRA MENS) TABS Take 1 tablet by mouth.    . sacubitril-valsartan (ENTRESTO) 24-26 MG Take 1 tablet by mouth 2 (two) times daily. 60 tablet 2  . spironolactone (ALDACTONE) 25 MG tablet Take 1 tablet (25 mg total) by mouth daily. 30 tablet 2  . traZODone (DESYREL) 100 MG tablet Take 100 mg by mouth at  bedtime.    Marland Kitchen warfarin (COUMADIN) 2.5 MG tablet Take 1 tablet (2.5 mg total) by mouth daily at 4 PM. Or as directed by the Coumadin Clinic. 30 tablet 2   No current facility-administered medications for this encounter.    No Known Allergies    Social History   Socioeconomic History  . Marital status: Legally Separated    Spouse name: Not on file  . Number of children: Not on file  . Years of education: Not on file  . Highest education level: Not on file  Occupational History    Employer: [employer]  Tobacco Use  . Smoking  status: Never Smoker  . Smokeless tobacco: Never Used  Vaping Use  . Vaping Use: Never used  Substance and Sexual Activity  . Alcohol use: Yes    Alcohol/week: 1.0 standard drink    Types: 1 Standard drinks or equivalent per week  . Drug use: No  . Sexual activity: Yes    Birth control/protection: None  Other Topics Concern  . Not on file  Social History Narrative  . Not on file   Social Determinants of Health   Financial Resource Strain:   . Difficulty of Paying Living Expenses: Not on file  Food Insecurity:   . Worried About Programme researcher, broadcasting/film/video in the Last Year: Not on file  . Ran Out of Food in the Last Year: Not on file  Transportation Needs:   . Lack of Transportation (Medical): Not on file  . Lack of Transportation (Non-Medical): Not on file  Physical Activity:   . Days of Exercise per Week: Not on file  . Minutes of Exercise per Session: Not on file  Stress:   . Feeling of Stress : Not on file  Social Connections:   . Frequency of Communication with Friends and Family: Not on file  . Frequency of Social Gatherings with Friends and Family: Not on file  . Attends Religious Services: Not on file  . Active Member of Clubs or Organizations: Not on file  . Attends Banker Meetings: Not on file  . Marital Status: Not on file  Intimate Partner Violence:   . Fear of Current or Ex-Partner: Not on file  . Emotionally Abused: Not on file  . Physically Abused: Not on file  . Sexually Abused: Not on file     No family history on file.  Vitals:   08/02/20 1532  BP: 102/69  Pulse: 94  SpO2: 96%  Weight: 64.5 kg (142 lb 3.2 oz)     PHYSICAL EXAM: General:  Well appearing. No respiratory difficulty HEENT: normal Neck: supple. no JVD. Carotids 2+ bilat; no bruits. No lymphadenopathy or thyromegaly appreciated. Cor: PMI nondisplaced. Regular rate & rhythm. No rubs, gallops or murmurs. Sternotomy site well healed. No drainage  Lungs: clear Abdomen: soft,  nontender, nondistended. No hepatosplenomegaly. No bruits or masses. Good bowel sounds. Extremities: no cyanosis, clubbing, rash, edema, Rt radial harvest access site well healed  Neuro: alert & oriented x 3, cranial nerves grossly intact. moves all 4 extremities w/o difficulty. Affect pleasant.  ECG: NSR 90 bpm    ASSESSMENT & PLAN:  1. CAD s/p MI: Late presentation inferior MI 10/21. Cath showed thrombotic occlusion distal RCA, CTO LAD with collaterals from right, and complex 90% proximal LCx stenosis. He had PTCA/thrombectomy RCA with good flow at end of procedure. Suspect he had damage to LAD territory as well as RCA territory as RCA collateralized the LAD. S/p  CABG w/ impella support with LIMA-LAD, left radial to OM, seq SVG-PDA/PLV. Post op course c/b tamponade and was taken back to the OR on 10/14 for drainage, found to have large hematoma obstructing right atrium.  - doing well post discharge. No ischemic CP.  - continue ASA, statin +  blocker  2. Chronic Systolic Heart Failure: ischemic CM in setting of severe multivessel CAD, s/p CABG. EF 25-30%. RV ok. Volume status ok. NYHA Class II - Continue Entresto 24-26 mg bid (BP too soft for titration) - Continue Spiro 25 mg daily  - Continue Coreg 3.125 mg bid - Continue Digoxin. Check Dig level  - Add Farxiga 10 mg daily.  - Check BMP today and again in 7 days - Continue gradual med titration q2-3 weeks until fully optimized. Plan repeat echo ~3 months. If EF remains < 35%, will refer to EP for potential ICD.   3. Severe MR - s/p MV repair at time of CABG 10/21   4. LV Thrombus  - continue coumadin  - INRs followed in coumadin clinic. Denies abnormal bleeding. Check CBC today   5. Post-operative Atrial Fibrillation  - NSR on EKG today. Denies breakthrough symptoms - reduce amiodarone down to 200 mg daily. If no recurrence at return f/u, can likely d/c completely.  - continue coumadin (also on for LV thrombus)   6. Post MI  Pericarditis:  - pleuritic CP resolved - plan to continue colchicein for duration of 3 months (stop date 10/12/20)  F/u w/ Pharm D in 3 weeks. F/u w/ APP in 6 weeks. F/u w/ Dr. Shirlee Latch in 12 weeks w/ repeat echo    Robbie Lis, PA-C 08/02/20

## 2020-08-03 ENCOUNTER — Ambulatory Visit (INDEPENDENT_AMBULATORY_CARE_PROVIDER_SITE_OTHER): Payer: 59

## 2020-08-03 DIAGNOSIS — I824Y2 Acute embolism and thrombosis of unspecified deep veins of left proximal lower extremity: Secondary | ICD-10-CM

## 2020-08-03 DIAGNOSIS — Z5181 Encounter for therapeutic drug level monitoring: Secondary | ICD-10-CM

## 2020-08-03 DIAGNOSIS — I4891 Unspecified atrial fibrillation: Secondary | ICD-10-CM | POA: Diagnosis not present

## 2020-08-03 LAB — POCT INR: INR: 1.7 — AB (ref 2.0–3.0)

## 2020-08-03 NOTE — Patient Instructions (Signed)
Description   Start taking 1/2 tablet every day except 1 full tablet on Mondays, Wednesdays and Fridays. Recheck INR in 1 week (pt only available for visits on Tuesdays). Coumadin Clinic [phone]

## 2020-08-04 ENCOUNTER — Telehealth (HOSPITAL_COMMUNITY): Payer: Self-pay

## 2020-08-04 NOTE — Telephone Encounter (Signed)
Pt insurance is active and benefits verified through [facility]. Co-pay $25.00, DED $500.00/$500.00 met, out of pocket $2,500.00/$2,500.00 met, co-insurance 0%. No pre-authorization required. Passport, 08/04/20 @ 10:28AM, [id number]  Will contact patient to see if he is interested in the Cardiac Rehab Program.

## 2020-08-10 ENCOUNTER — Other Ambulatory Visit: Payer: Self-pay | Admitting: Thoracic Surgery (Cardiothoracic Vascular Surgery)

## 2020-08-10 ENCOUNTER — Ambulatory Visit (HOSPITAL_COMMUNITY)
Admission: RE | Admit: 2020-08-10 | Discharge: 2020-08-10 | Disposition: A | Discharge status: Home / Self Care | Payer: 59 | Source: Ambulatory Visit | Attending: Internal Medicine | Admitting: Internal Medicine

## 2020-08-10 ENCOUNTER — Other Ambulatory Visit: Payer: Self-pay

## 2020-08-10 ENCOUNTER — Ambulatory Visit (INDEPENDENT_AMBULATORY_CARE_PROVIDER_SITE_OTHER): Payer: 59 | Admitting: *Deleted

## 2020-08-10 DIAGNOSIS — I4891 Unspecified atrial fibrillation: Secondary | ICD-10-CM | POA: Diagnosis not present

## 2020-08-10 DIAGNOSIS — I824Y2 Acute embolism and thrombosis of unspecified deep veins of left proximal lower extremity: Secondary | ICD-10-CM | POA: Diagnosis not present

## 2020-08-10 DIAGNOSIS — Z5181 Encounter for therapeutic drug level monitoring: Secondary | ICD-10-CM

## 2020-08-10 DIAGNOSIS — Z951 Presence of aortocoronary bypass graft: Secondary | ICD-10-CM

## 2020-08-10 DIAGNOSIS — I5042 Chronic combined systolic (congestive) and diastolic (congestive) heart failure: Secondary | ICD-10-CM | POA: Diagnosis present

## 2020-08-10 LAB — BASIC METABOLIC PANEL
Anion gap: 9 (ref 5–15)
BUN: 14 mg/dL (ref 6–20)
CO2: 25 mmol/L (ref 22–32)
Calcium: 9.3 mg/dL (ref 8.9–10.3)
Chloride: 105 mmol/L (ref 98–111)
Creatinine, Ser: 0.98 mg/dL (ref 0.61–1.24)
GFR, Estimated: >60 mL/min (ref >60)
Glucose, Bld: 146 mg/dL — ABNORMAL HIGH (ref 70–99)
Potassium: 4.4 mmol/L (ref 3.5–5.1)
Sodium: 139 mmol/L (ref 135–145)

## 2020-08-10 LAB — POCT INR: INR: 2.3 (ref 2.0–3.0)

## 2020-08-10 NOTE — Patient Instructions (Signed)
Description   Continue taking 1/2 tablet every day except 1 full tablet on Mondays, Wednesdays and Fridays. Recheck INR in 1 week (pt only available for visits on Tuesdays). Coumadin Clinic [phone]

## 2020-08-13 NOTE — Progress Notes (Signed)
Referring Physician: PCP: Sigmund Hazel, MD PCP-Cardiologist: Tonny Bollman, MD  [facility]: Dr. Shirlee Latch  CT Surgery: Dr. Dorris Fetch   HPI:  49 y/o male s/p recent back surgery 05/2020, admitted for acute inferior and anterolateral MI on 06/25/20. LHC showed thrombotic occlusion of distal RCA that was treated with PTCA and thrombectomy. He was also noted to have chronic total occlusion of the LAD with collaterals from the right (thus this territory was affected by the RCA MI) as well as 90% complex proximal LCx stenosis. EF 25-30% + apical thrombus and severe mitral regurgitation. RV normal. Also w/ post MI pericarditis, treated w/ colchicine. Course further c/b cardiogenic shock and acute hypoxic respiratory failure w/ component of septic shock 2/2 PNA, requiring intubation and treatment w/ abx. Once stabilized, he underwent CABG x 3 (LIMA-LAD, Lt radial-OM1 and sequential SVG-PDA/PLA) + MV repair and placement of Impella 5.5 on 10/13 by Dr. Dorris Fetch.  Post operatively, he developed tamponade and was taken back to the OR on 10/14 with large hematoma obstructing right atrium. He improved and was extubated 10/19.  Impella removal 10/21 w/ stable hemodynamics and able to tolerate initiation of GDMT.  Also had post operative afib treated w/ amio. He was discharged home on 10/27.   He recently presented to clinic for post hospital f/u on 08/02/20. Came with his girlfriend. Looked and felt well. Denied CP. No dyspnea at rest or with basic ADLs. NYHA class II. EKG showed NSR. HR was well controlled. Denied symptoms of breakthrough afib. Compliant with all meds including coumadin. INRs followed by coumadin clinic. Denied abnormal bleeding. Tolerating meds ok w/o orthostatic symptoms. BP soft but was stable. Denied alcohol and tobacco use.   Today he returns to HF clinic for pharmacist medication titration. At last visit with [facility], Farxiga 10 mg daily was initiated. However, his insurance prefers [place] so  he was changed to Jardiance 10 mg daily. Overall feeling well today. Had some dizziness when starting on medications initially, but none in the last 2 weeks. No fatigue, chest pain or palpitations. Weights have been stable at home at 144-146 lbs. Has gained some weight over the last month, but this is intentional. Not taking any diuretic. No LEE, PND or orthopnea. Does sleep on 3 pillows, but this is to prevent acid reflux. Taking all medications as prescribed and tolerating all medications.   HF Medications: Carvedilol 3.125 mg BID Entresto 24/26 mg BID Spironolactone 25 mg daily Jardiance 10 mg daily Digoxin 0.125 mg daily  Has the patient been experiencing any side effects to the medications prescribed?  no  Does the patient have any problems obtaining medications due to transportation or finances?   No - Has [facility]  Understanding of regimen: good Understanding of indications: good Potential of compliance: good Patient understands to avoid NSAIDs. Patient understands to avoid decongestants.    Pertinent Lab Values: . Serum creatinine 0.98, BUN 14, Potassium 4.4, Sodium 139, Digoxin 0.8 ng/mL   Vital Signs: . Weight: 152 lbs (last clinic weight: 144.8 lbs) . Blood pressure: 106/70  . Heart rate: 92   Assessment: 1. CAD s/p MI: Late presentation inferior MI 10/21. Cath showed thrombotic occlusion distal RCA, CTO LAD with collaterals from right, and complex 90% proximal LCx stenosis. He had PTCA/thrombectomy RCA with good flow at end of procedure. Suspect he had damage to LAD territory as well as RCA territory as RCA collateralized the LAD. S/p CABG w/ impella support with LIMA-LAD, left radial to OM, seq SVG-PDA/PLV. Post  op course c/b tamponade and was taken back to the OR on 10/14 for drainage, found to have large hematoma obstructing right atrium.  - doing well post discharge. No ischemic CP.  - continue ASA, statin + ? blocker  2. Chronic Systolic Heart  Failure: ischemic CM in setting of severe multivessel CAD, s/p CABG. EF 25-30%. RV ok.  - Volume status ok. NYHA Class II - Increase carvedilol to 6.25 mg BID - Continue Entresto 24-26 mg BID  - Continue Spironolactone 25 mg daily  - Continue Digoxin 0.125 mg daily - Continue Jardiance 10 mg daily.  - Continue gradual med titration q2-3 weeks until fully optimized. Plan repeat echo ~3 months. If EF remains < 35%, will refer to EP for potential ICD.   3. Severe MR - s/p MV repair at time of CABG 10/21   4. LV Thrombus  - continue coumadin  - INRs followed in coumadin clinic.  5. Post-operative Atrial Fibrillation  - Continue amiodarone 200 mg daily. If no recurrence at return f/u, can likely d/c completely.  - continue coumadin (also on for LV thrombus)   6. Post MI Pericarditis:  - pleuritic CP resolved - plan to continue colchicine for duration of 3 months (stop date 10/12/20)   Plan: 1) Medication changes: Based on clinical presentation, vital signs and recent labs will increase carvedilol to 6.25 mg BID 2) Follow-up: 1 month with [facility]   Karle Plumber, PharmD, BCPS, BCCP, CPP Heart Failure Clinic Pharmacist [phone]

## 2020-08-17 ENCOUNTER — Encounter: Payer: Self-pay | Admitting: Thoracic Surgery (Cardiothoracic Vascular Surgery)

## 2020-08-17 ENCOUNTER — Ambulatory Visit (INDEPENDENT_AMBULATORY_CARE_PROVIDER_SITE_OTHER): Payer: 59

## 2020-08-17 ENCOUNTER — Other Ambulatory Visit: Payer: Self-pay

## 2020-08-17 ENCOUNTER — Ambulatory Visit
Admission: RE | Admit: 2020-08-17 | Discharge: 2020-08-17 | Disposition: A | Discharge status: Home / Self Care | Payer: 59 | Source: Ambulatory Visit | Attending: Thoracic Surgery (Cardiothoracic Vascular Surgery) | Admitting: Thoracic Surgery (Cardiothoracic Vascular Surgery)

## 2020-08-17 ENCOUNTER — Ambulatory Visit (INDEPENDENT_AMBULATORY_CARE_PROVIDER_SITE_OTHER): Payer: Self-pay | Admitting: Thoracic Surgery (Cardiothoracic Vascular Surgery)

## 2020-08-17 VITALS — BP 108/81 | HR 96 | Resp 18 | Ht 68.0 in | Wt 144.8 lb

## 2020-08-17 DIAGNOSIS — I4891 Unspecified atrial fibrillation: Secondary | ICD-10-CM

## 2020-08-17 DIAGNOSIS — Z951 Presence of aortocoronary bypass graft: Secondary | ICD-10-CM

## 2020-08-17 DIAGNOSIS — I824Y2 Acute embolism and thrombosis of unspecified deep veins of left proximal lower extremity: Secondary | ICD-10-CM | POA: Diagnosis not present

## 2020-08-17 DIAGNOSIS — Z5181 Encounter for therapeutic drug level monitoring: Secondary | ICD-10-CM

## 2020-08-17 LAB — POCT INR: INR: 2 (ref 2.0–3.0)

## 2020-08-17 NOTE — Progress Notes (Signed)
[address]       [place] [address]             [phone]     HPI: Mr. Ryback returns for a scheduled follow-up visit  Demontray Franta is a 49 year old man who presented in early October late in the course of an ST elevation MI.  He was taken to the Cath Lab.  He had a chronic total occlusion of the LAD and an acute occlusion of the RCA which was opened up.  He also had severe circumflex disease.  He had acute pulmonary edema and severe pericarditis.  He had to be intubated preoperatively.  He also stabilized and went for coronary bypass grafting, mitral annuloplasty, and placement of an Impella pump on 07/07/2020.  He had to go back to the OR the following day for tamponade.  He was on the ventilator for a few days.  He ultimately went back for removal of the Impella on 07/15/2020.  He was discharged home on 07/21/2020.  Since discharge he has been doing well.  He is walking a mile at a time 3 times a day.  He has some soreness but no real incisional pain and is not taking any narcotics.  He does have some difficulty lying flat.  He has been taking Prilosec for reflux.  He has not had any anginal pain.  He has not had any swelling in his legs.  Past Medical History:  Diagnosis Date  . Anxiety   . Coronary artery disease   . Myocardial infarction [facility])     Current Outpatient Medications  Medication Sig Dispense Refill  . acetaminophen (TYLENOL) 325 MG tablet Take 2 tablets (650 mg total) by mouth every 6 (six) hours as needed for mild pain.    Marland Kitchen ALPRAZolam (XANAX) 0.5 MG tablet Take 0.5 mg by mouth at bedtime as needed for sleep.     Marland Kitchen amiodarone (PACERONE) 200 MG tablet Take 1 tablet (200 mg total) by mouth daily. 30 tablet 3  . aspirin EC 81 MG EC tablet Take 1 tablet (81 mg total) by mouth daily. Swallow whole. 30 tablet 11  . atorvastatin (LIPITOR) 80 MG tablet Place 1 tablet (80 mg total) into feeding tube daily. 30 tablet 2  . carvedilol (COREG) 3.125 MG  tablet Take 1 tablet (3.125 mg total) by mouth 2 (two) times daily with a meal. 60 tablet 2  . colchicine 0.6 MG tablet Take 1 tablet (0.6 mg total) by mouth daily. 30 tablet 2  . dapagliflozin propanediol (FARXIGA) 10 MG TABS tablet Take 1 tablet (10 mg total) by mouth daily before breakfast. 30 tablet 11  . digoxin (LANOXIN) 0.125 MG tablet Take 1 tablet (0.125 mg total) by mouth daily. 30 tablet 2  . Multiple Vitamins-Minerals (CENTRUM ULTRA MENS) TABS Take 1 tablet by mouth.    . sacubitril-valsartan (ENTRESTO) 24-26 MG Take 1 tablet by mouth 2 (two) times daily. 60 tablet 2  . spironolactone (ALDACTONE) 25 MG tablet Take 1 tablet (25 mg total) by mouth daily. 30 tablet 2  . traZODone (DESYREL) 100 MG tablet Take 100 mg by mouth at bedtime.    Marland Kitchen warfarin (COUMADIN) 2.5 MG tablet Take 1 tablet (2.5 mg total) by mouth daily at 4 PM. Or as directed by the Coumadin Clinic. 30 tablet 2   No current facility-administered medications for this visit.    Physical Exam BP 108/81 (BP Location: Right Arm, Patient Position: Sitting)  Pulse 96   Resp 18   Ht 5\' 8"  (1.727 m)   Wt 144 lb 12.8 oz (65.7 kg)   SpO2 96% Comment: RA with mask on  BMI 22.10 kg/m  49 year old man in no acute distress Alert and oriented x3 with no focal deficits Sternum stable, incision well-healed Cardiac regular rate and rhythm, no murmur Lungs clear with equal breath sounds bilaterally Left arm incision well-healed, neuro intact, brisk cap refill in fingers Leg incisions well-healed, no peripheral edema  Diagnostic Tests: CHEST - 2 VIEW  COMPARISON:  07/21/2020  FINDINGS: Cardiac shadow is stable. Postsurgical changes are again seen. Surgical staples have been removed in the right subclavicular region. Elevation of left hemidiaphragm is noted with some persistent mild left basilar atelectasis. Small effusion is noted as well. Right lung remains clear.  IMPRESSION: Stable left basilar atelectasis and  effusion. No new focal abnormality is seen.   Electronically Signed   By: 07/23/2020 M.D.   On: 08/17/2020 09:40 I personally reviewed the chest x-ray.  There is mild elevation of the left hemidiaphragm  Impression: Jerry [name] is a 49 year old man who presented in early October late in the course of an ST elevation MI.  That was complicated by pulmonary edema, pericarditis, VDRF, and delirium.  He ultimately underwent coronary bypass grafting, mitral annuloplasty, and Impella placement on 07/07/2020.  He developed tamponade with clot localized over the right atrium and had to go back to the OR on 07/08/2020.  His Impella was removed on 07/15/2020 and he was discharged home on 07/21/2020.  He looks great currently.  It is really hard to even imagine what all he went through.  He is not having any anginal pain.  He is not having any significant surgical pain.  His exercise tolerance is surprisingly good.  He should not lift anything over 10 pounds for another week and nothing over 20 pounds until after Christmas.  He can continue to push himself activity wise.  He may begin driving.  Appropriate precautions were discussed.  He has seen the heart failure team and they will manage his medications.  He does have an elevated left hemidiaphragm.  He is essentially asymptomatic from that.  That will usually recover with time but often takes 6 to 9 months.  Plan: Follow-up as scheduled with heart failure team Return in 2 months with PA lateral chest x-ray  04-17-1972, MD [facility] [phone]

## 2020-08-17 NOTE — Patient Instructions (Signed)
Description   Start taking 1 tablet every day except 1/2 tablet on Sundays, Tuesdays and Thursdays. Recheck INR in 2 weeks (pt only available for visits on Tuesdays). Coumadin Clinic [phone]

## 2020-08-23 ENCOUNTER — Other Ambulatory Visit (HOSPITAL_COMMUNITY): Payer: Self-pay

## 2020-08-23 NOTE — Telephone Encounter (Signed)
Medication Samples have been provided to the patient.  Drug name: Sherryll Burger       Strength: 24-26mg         Qty: 4  LOT: [id number]  Exp.Date: 11/2021  Dosing instructions: Take 1 tablet po bid  The patient has been instructed regarding the correct time, dose, and frequency of taking this medication, including desired effects and most common side effects.    R  9:15 AM 08/23/2020   Medication Samples have been provided to the patient.  Drug name: Marcelline Deist       Strength: 10mg         Qty: 4  LOT: )  Exp.Date: 12/2022,02/23/2023  Dosing instructions: Take 1 tablet po QD  The patient has been instructed regarding the correct time, dose, and frequency of taking this medication, including desired effects and most common side effects.    R  9:16 AM 08/23/2020

## 2020-08-24 ENCOUNTER — Telehealth (HOSPITAL_COMMUNITY): Payer: Self-pay | Admitting: Cardiology

## 2020-08-24 ENCOUNTER — Telehealth (HOSPITAL_COMMUNITY): Payer: Self-pay | Admitting: Pharmacy Technician

## 2020-08-24 ENCOUNTER — Ambulatory Visit: Payer: 59 | Attending: Internal Medicine

## 2020-08-24 DIAGNOSIS — Z23 Encounter for immunization: Secondary | ICD-10-CM

## 2020-08-24 MED ORDER — EMPAGLIFLOZIN 10 MG PO TABS: 10.0000 mg | ORAL_TABLET | Freq: Every day | ORAL | 11 refills | Status: DC

## 2020-08-24 NOTE — Telephone Encounter (Signed)
insurance prefers [place] over [place]

## 2020-08-24 NOTE — Telephone Encounter (Signed)
Patient called and left a message regarding Marcelline Deist and Sherryll Burger. He states that there needs to be a PA done on each medication in order for him to fill them at the pharmacy.  Upon investigation, insurance prefers [place] over [place]. The current 30 day co-pay is $0.   Received notification from [employer] that prior authorization for Sherryll Burger is required.   PA submitted on CoverMyMeds Key [id number]  Status is pending   Will continue to follow.

## 2020-08-24 NOTE — Progress Notes (Signed)
   Covid-19 Vaccination Clinic  Name:  Jerry [name]    MRN: [id number] DOB: [birth year]  08/24/2020  [name] was observed post Covid-19 immunization for 15 minutes without incident. He was provided with Vaccine Information Sheet and instruction to access the V-Safe system.   [name] was instructed to call 911 with any severe reactions post vaccine: Marland Kitchen Difficulty breathing  . Swelling of face and throat  . A fast heartbeat  . A bad rash all over body  . Dizziness and weakness   Immunizations Administered    No immunizations on file.

## 2020-08-24 NOTE — Telephone Encounter (Signed)
Advanced Heart Failure Patient Advocate Encounter  Prior Authorization for Sherryll Burger has been approved.    PA#  [id number] Effective dates: 08/24/20 through 08/24/21  Patients co-pay is $0.  Called and updated the patient. He is aware not to fill [place] and to pick up [employer] instead.  Archer Asa, CPhT

## 2020-08-25 ENCOUNTER — Other Ambulatory Visit: Payer: Self-pay

## 2020-08-25 ENCOUNTER — Ambulatory Visit (HOSPITAL_COMMUNITY)
Admission: RE | Admit: 2020-08-25 | Discharge: 2020-08-25 | Disposition: A | Discharge status: Home / Self Care | Payer: 59 | Source: Ambulatory Visit | Attending: Internal Medicine | Admitting: Internal Medicine

## 2020-08-25 VITALS — BP 106/70 | HR 92 | Wt 152.0 lb

## 2020-08-25 DIAGNOSIS — Z7901 Long term (current) use of anticoagulants: Secondary | ICD-10-CM | POA: Insufficient documentation

## 2020-08-25 DIAGNOSIS — I319 Disease of pericardium, unspecified: Secondary | ICD-10-CM | POA: Insufficient documentation

## 2020-08-25 DIAGNOSIS — I97190 Other postprocedural cardiac functional disturbances following cardiac surgery: Secondary | ICD-10-CM | POA: Insufficient documentation

## 2020-08-25 DIAGNOSIS — I5022 Chronic systolic (congestive) heart failure: Secondary | ICD-10-CM | POA: Diagnosis not present

## 2020-08-25 DIAGNOSIS — Z955 Presence of coronary angioplasty implant and graft: Secondary | ICD-10-CM | POA: Insufficient documentation

## 2020-08-25 DIAGNOSIS — Z951 Presence of aortocoronary bypass graft: Secondary | ICD-10-CM | POA: Diagnosis not present

## 2020-08-25 DIAGNOSIS — I255 Ischemic cardiomyopathy: Secondary | ICD-10-CM | POA: Insufficient documentation

## 2020-08-25 DIAGNOSIS — I2510 Atherosclerotic heart disease of native coronary artery without angina pectoris: Secondary | ICD-10-CM | POA: Diagnosis not present

## 2020-08-25 DIAGNOSIS — I252 Old myocardial infarction: Secondary | ICD-10-CM | POA: Diagnosis not present

## 2020-08-25 MED ORDER — CARVEDILOL 6.25 MG PO TABS
6.2500 mg | ORAL_TABLET | Freq: Two times a day (BID) | ORAL | 11 refills | Status: DC
Start: 2020-08-25 — End: 2020-10-12

## 2020-08-25 NOTE — Patient Instructions (Signed)
It was a pleasure seeing you today!  MEDICATIONS: -We are changing your medications today -Increase carvedilol to 6.25 mg (1 tablet) twice daily. -Call if you have questions about your medications.   NEXT APPOINTMENT: Return to clinic in 1 month with [facility].  In general, to take care of your heart failure: -Limit your fluid intake to 2 Liters (half-gallon) per day.   -Limit your salt intake to ideally 2-3 grams (2000-3000 mg) per day. -Weigh yourself daily and record, and bring that "weight diary" to your next appointment.  (Weight gain of 2-3 pounds in 1 day typically means fluid weight.) -The medications for your heart are to help your heart and help you live longer.   -Please contact us before stopping any of your heart medications.  Call the clinic at [phone] with questions or to reschedule future appointments.

## 2020-08-26 ENCOUNTER — Other Ambulatory Visit: Payer: 59

## 2020-08-30 NOTE — Patient Instructions (Addendum)
  Description   Continue on same dosage 1 tablet every day except 1/2 tablet on Sundays, Tuesdays and Thursdays. Recheck INR in 3 weeks (pt only available for visits on Tuesdays). Coumadin Clinic [phone]

## 2020-08-31 ENCOUNTER — Ambulatory Visit (INDEPENDENT_AMBULATORY_CARE_PROVIDER_SITE_OTHER): Payer: 59

## 2020-08-31 ENCOUNTER — Other Ambulatory Visit: Payer: Self-pay

## 2020-08-31 DIAGNOSIS — I824Y2 Acute embolism and thrombosis of unspecified deep veins of left proximal lower extremity: Secondary | ICD-10-CM

## 2020-08-31 DIAGNOSIS — I4891 Unspecified atrial fibrillation: Secondary | ICD-10-CM

## 2020-08-31 DIAGNOSIS — Z5181 Encounter for therapeutic drug level monitoring: Secondary | ICD-10-CM | POA: Diagnosis not present

## 2020-08-31 LAB — POCT INR: INR: 2.8 (ref 2.0–3.0)

## 2020-09-06 ENCOUNTER — Telehealth (HOSPITAL_COMMUNITY): Payer: Self-pay

## 2020-09-06 NOTE — Telephone Encounter (Signed)
Called and spoke with pt in regards to CR, pt stated he is not interested at this time. He is exercising at home.  Closed referral

## 2020-09-21 ENCOUNTER — Ambulatory Visit (INDEPENDENT_AMBULATORY_CARE_PROVIDER_SITE_OTHER): Payer: 59 | Admitting: *Deleted

## 2020-09-21 ENCOUNTER — Other Ambulatory Visit: Payer: Self-pay

## 2020-09-21 DIAGNOSIS — Z5181 Encounter for therapeutic drug level monitoring: Secondary | ICD-10-CM

## 2020-09-21 DIAGNOSIS — I824Y2 Acute embolism and thrombosis of unspecified deep veins of left proximal lower extremity: Secondary | ICD-10-CM

## 2020-09-21 DIAGNOSIS — I4891 Unspecified atrial fibrillation: Secondary | ICD-10-CM

## 2020-09-21 LAB — POCT INR: INR: 2.4 (ref 2.0–3.0)

## 2020-09-21 NOTE — Patient Instructions (Addendum)
  Description   Continue taking Warfarin 1 tablet everyday except 1/2 tablet on Sundays, Tuesdays and Thursdays. Recheck INR in 4 weeks (pt only available for visits on Tuesdays). Coumadin Clinic [phone]

## 2020-09-28 ENCOUNTER — Ambulatory Visit (HOSPITAL_COMMUNITY)
Admission: RE | Admit: 2020-09-28 | Discharge: 2020-09-28 | Disposition: A | Discharge status: Home / Self Care | Payer: 59 | Source: Ambulatory Visit | Attending: Cardiology | Admitting: Cardiology

## 2020-09-28 ENCOUNTER — Encounter (HOSPITAL_COMMUNITY): Payer: Self-pay

## 2020-09-28 ENCOUNTER — Other Ambulatory Visit: Payer: Self-pay

## 2020-09-28 VITALS — BP 110/82 | HR 97 | Wt 154.2 lb

## 2020-09-28 DIAGNOSIS — I5022 Chronic systolic (congestive) heart failure: Secondary | ICD-10-CM | POA: Diagnosis present

## 2020-09-28 DIAGNOSIS — I2510 Atherosclerotic heart disease of native coronary artery without angina pectoris: Secondary | ICD-10-CM | POA: Diagnosis not present

## 2020-09-28 DIAGNOSIS — I241 Dressler's syndrome: Secondary | ICD-10-CM | POA: Insufficient documentation

## 2020-09-28 DIAGNOSIS — Z7984 Long term (current) use of oral hypoglycemic drugs: Secondary | ICD-10-CM | POA: Diagnosis not present

## 2020-09-28 DIAGNOSIS — I97190 Other postprocedural cardiac functional disturbances following cardiac surgery: Secondary | ICD-10-CM | POA: Diagnosis not present

## 2020-09-28 DIAGNOSIS — Z7901 Long term (current) use of anticoagulants: Secondary | ICD-10-CM | POA: Insufficient documentation

## 2020-09-28 DIAGNOSIS — I340 Nonrheumatic mitral (valve) insufficiency: Secondary | ICD-10-CM | POA: Diagnosis not present

## 2020-09-28 DIAGNOSIS — Z7982 Long term (current) use of aspirin: Secondary | ICD-10-CM | POA: Diagnosis not present

## 2020-09-28 DIAGNOSIS — Z79899 Other long term (current) drug therapy: Secondary | ICD-10-CM | POA: Diagnosis not present

## 2020-09-28 DIAGNOSIS — Y832 Surgical operation with anastomosis, bypass or graft as the cause of abnormal reaction of the patient, or of later complication, without mention of misadventure at the time of the procedure: Secondary | ICD-10-CM | POA: Insufficient documentation

## 2020-09-28 DIAGNOSIS — I252 Old myocardial infarction: Secondary | ICD-10-CM | POA: Insufficient documentation

## 2020-09-28 LAB — CBC
HCT: 48.9 % (ref 39.0–52.0)
Hemoglobin: 15.9 g/dL (ref 13.0–17.0)
MCH: 28.1 pg (ref 26.0–34.0)
MCHC: 32.5 g/dL (ref 30.0–36.0)
MCV: 86.4 fL (ref 80.0–100.0)
Platelets: 239 10*3/uL (ref 150–400)
RBC: 5.66 MIL/uL (ref 4.22–5.81)
RDW: 14.2 % (ref 11.5–15.5)
WBC: 11.8 10*3/uL — ABNORMAL HIGH (ref 4.0–10.5)
nRBC: 0 % (ref 0.0–0.2)

## 2020-09-28 LAB — BASIC METABOLIC PANEL
Anion gap: 10 (ref 5–15)
BUN: 21 mg/dL — ABNORMAL HIGH (ref 6–20)
CO2: 25 mmol/L (ref 22–32)
Calcium: 9.9 mg/dL (ref 8.9–10.3)
Chloride: 104 mmol/L (ref 98–111)
Creatinine, Ser: 1.07 mg/dL (ref 0.61–1.24)
GFR, Estimated: >60 mL/min (ref >60)
Glucose, Bld: 133 mg/dL — ABNORMAL HIGH (ref 70–99)
Potassium: 4.8 mmol/L (ref 3.5–5.1)
Sodium: 139 mmol/L (ref 135–145)

## 2020-09-28 LAB — HEPATIC FUNCTION PANEL
ALT: 23 U/L (ref 0–44)
AST: 23 U/L (ref 15–41)
Albumin: 4.1 g/dL (ref 3.5–5.0)
Alkaline Phosphatase: 63 U/L (ref 38–126)
Bilirubin, Direct: <0.1 mg/dL (ref 0.0–0.2)
Total Bilirubin: 0.5 mg/dL (ref 0.3–1.2)
Total Protein: 7.3 g/dL (ref 6.5–8.1)

## 2020-09-28 LAB — TSH: TSH: 2.995 u[IU]/mL (ref 0.350–4.500)

## 2020-09-28 LAB — DIGOXIN LEVEL: Digoxin Level: 1.2 ng/mL (ref 0.8–2.0)

## 2020-09-28 MED ORDER — ENTRESTO 49-51 MG PO TABS: 1.0000 | ORAL_TABLET | Freq: Two times a day (BID) | ORAL | 6 refills | Status: DC

## 2020-09-28 NOTE — Progress Notes (Signed)
Advanced Heart Failure Clinic Note   Referring Physician: PCP: Sigmund Hazel, MD PCP-Cardiologist: Tonny Bollman, MD  [facility]: Dr. Shirlee Latch  CT Surgery: Dr. Michelene Gardener F/u: S/p Acute MI c/b systolic heart failure/ cardiogenic shock, s/p CABG   HPI:   50 y/o male s/p recent back surgery 05/2020, admitted for acute inferior and anterolateral MI on 06/25/20. LHC showed thrombotic occlusion of distal RCA that was treated with PTCA and thrombectomy. He was also noted to have chronic total occlusion of the LAD with collaterals from the right (thus this territory was affected by the RCA MI) as well as 90% complex proximal LCx stenosis. EF 25-30% + apical thrombus and severe mitral regurgitation. RV normal. Also w/ post MI pericarditis, treated w/ colchicine. Course further c/b cardiogenic shock and acute hypoxic respiratory failure w/ component of septic shock 2/2 PNA, requiring intubation and treatment w/ abx. Once stabilized, he underwent CABG x 3 (LIMA-LAD, Lt radial-OM1 and sequential SVG-PDA/PLA) + MV repair and placement of Impella 5.5 on 10/13 by Dr. Dorris Fetch.  Post operatively, he developed tamponade and was taken back to the OR on 10/14 with large hematoma obstructing right atrium. He improved and was extubated 10/19.  Impella removal 10/21 w/ stable hemodynamics and able to tolerate initiation of GDMT.  Also had post operative afib treated w/ amio. He was discharged home on 10/27.   He presents to clinic today for routine f/u. Here w/ his girlfriend. Has been doing well. Feels great. Denies ischemic CP. No pleuritic chest pain (just finished 3 months of colchicine). NYHA Class II. Denies orthopnea, PND or LEE. No palpitations. EKG today shows NSR. Reports full compliance w/ coumadin and his INRs have been therapeutic. Denies falls. No abnormal bleeding. BP well controlled. No orthostatic symptoms. Compliant w/ HF meds.   Review of systems complete and found to be negative unless  listed in HPI.      Past Medical History:  Diagnosis Date  . Anxiety   . Coronary artery disease   . Myocardial infarction [facility])     Current Outpatient Medications  Medication Sig Dispense Refill  . acetaminophen (TYLENOL) 325 MG tablet Take 2 tablets (650 mg total) by mouth every 6 (six) hours as needed for mild pain.    Marland Kitchen ALPRAZolam (XANAX) 0.5 MG tablet Take 0.5 mg by mouth at bedtime as needed for sleep.     Marland Kitchen amiodarone (PACERONE) 200 MG tablet Take 1 tablet (200 mg total) by mouth daily. 30 tablet 3  . aspirin EC 81 MG EC tablet Take 1 tablet (81 mg total) by mouth daily. Swallow whole. 30 tablet 11  . atorvastatin (LIPITOR) 80 MG tablet Place 1 tablet (80 mg total) into feeding tube daily. 30 tablet 2  . carvedilol (COREG) 6.25 MG tablet Take 1 tablet (6.25 mg total) by mouth 2 (two) times daily with a meal. 60 tablet 11  . digoxin (LANOXIN) 0.125 MG tablet Take 1 tablet (0.125 mg total) by mouth daily. 30 tablet 2  . empagliflozin (JARDIANCE) 10 MG TABS tablet Take 1 tablet (10 mg total) by mouth daily before breakfast. 30 tablet 11  . Multiple Vitamins-Minerals (CENTRUM ULTRA MENS) TABS Take 1 tablet by mouth.    . sacubitril-valsartan (ENTRESTO) 24-26 MG Take 1 tablet by mouth 2 (two) times daily. 60 tablet 2  . spironolactone (ALDACTONE) 25 MG tablet Take 1 tablet (25 mg total) by mouth daily. 30 tablet 2  . traZODone (DESYREL) 100 MG tablet Take 100 mg by  mouth at bedtime.    Marland Kitchen warfarin (COUMADIN) 2.5 MG tablet Take 1 tablet (2.5 mg total) by mouth daily at 4 PM. Or as directed by the Coumadin Clinic. 30 tablet 2   No current facility-administered medications for this encounter.    No Known Allergies    Social History   Socioeconomic History  . Marital status: Legally Separated    Spouse name: Not on file  . Number of children: Not on file  . Years of education: Not on file  . Highest education level: Not on file  Occupational History    Employer: [employer]  Tobacco Use  . Smoking status: Never Smoker  . Smokeless tobacco: Never Used  Vaping Use  . Vaping Use: Never used  Substance and Sexual Activity  . Alcohol use: Yes    Alcohol/week: 1.0 standard drink    Types: 1 Standard drinks or equivalent per week  . Drug use: No  . Sexual activity: Yes    Birth control/protection: None  Other Topics Concern  . Not on file  Social History Narrative  . Not on file   Social Determinants of Health   Financial Resource Strain: Not on file  Food Insecurity: Not on file  Transportation Needs: Not on file  Physical Activity: Not on file  Stress: Not on file  Social Connections: Not on file  Intimate Partner Violence: Not on file     No family history on file.  Vitals:   09/28/20 0941  BP: 110/82  Pulse: 97  SpO2: 95%  Weight: 69.9 kg    PHYSICAL EXAM: General:  Well appearing. No respiratory difficulty HEENT: normal Neck: supple. no JVD. Carotids 2+ bilat; no bruits. No lymphadenopathy or thyromegaly appreciated. Cor: PMI nondisplaced. Regular rate & rhythm. No rubs, gallops or murmurs. Lungs: clear Abdomen: soft, nontender, nondistended. No hepatosplenomegaly. No bruits or masses. Good bowel sounds. Extremities: no cyanosis, clubbing, rash, edema Neuro: alert & oriented x 3, cranial nerves grossly intact. moves all 4 extremities w/o difficulty. Affect pleasant.   ECG: NSR 90 bpm    ASSESSMENT & PLAN:  1. CAD s/p MI: Late presentation inferior MI 10/21. Cath showed thrombotic occlusion distal RCA, CTO LAD with collaterals from right, and complex 90% proximal LCx stenosis. He had PTCA/thrombectomy RCA with good flow at end of procedure. Suspect he had damage to LAD territory as well as RCA territory as RCA collateralized the LAD. S/p CABG w/ impella support with LIMA-LAD, left radial to OM, seq SVG-PDA/PLV. Post op course c/b tamponade and was taken back to the OR on 10/14 for drainage, found to have large hematoma  obstructing right atrium.  - stable w/o ischemic CP  - continue ASA, statin +  blocker  2. Chronic Systolic Heart Failure: ischemic CM in setting of severe multivessel CAD, s/p CABG. EF 25-30%. RV ok. Volume status ok. NYHA Class II - Increase Entresto to 49-51 mg bid - Continue Spiro 25 mg daily  - Continue Coreg 3.125 mg bid. Did not tolerate higher dose due to fatigue  - Continue Digoxin. Check Dig level  - Continue Jardiance 10 mg daily.  - Check BMP today and again in 7 days - Continue gradual med titration q2-3 weeks until fully optimized. Plan repeat echo ~3 months (March). If EF remains < 35%, will refer to EP for potential ICD.   3. Severe MR - s/p MV repair at time of CABG 10/21  - denies CP and dyspnea   4. LV  Thrombus  - continue coumadin  - INRs followed in coumadin clinic. Denies abnormal bleeding. Check CBC today   5. Post-operative Atrial Fibrillation  - remains in NSR on EKG today  - can stop amiodarone and will check TSH and HFTs  - continue coumadin (also on for LV thrombus)   6. Post MI Pericarditis:  - pleuritic CP resolved - he has completed colchicein for duration of 3 months   F/u w/ Pharm D in 3 weeks for further med titration. F/u w/  Dr. Aundra Dubin in March w/ repeat echo    Lyda Jester, PA-C 09/28/20

## 2020-09-28 NOTE — Patient Instructions (Signed)
INCREASE Entresto to 49/51 mg, one tab twice a day STOP Colchicine STOP Amiodarone   Labs today We will only contact you if something comes back abnormal or we need to make some changes. Otherwise no news is good news!  Your physician recommends that you schedule a follow-up appointment in: 1 month with the pharmacy team  If you have any questions or concerns before your next appointment please send [place] a message through [place] or call our office at [phone].    TO LEAVE A MESSAGE FOR THE NURSE SELECT OPTION 2, PLEASE LEAVE A MESSAGE INCLUDING: . YOUR NAME . DATE OF BIRTH . CALL BACK NUMBER . REASON FOR CALL**this is important as we prioritize the call backs  YOU WILL RECEIVE A CALL BACK THE SAME DAY AS LONG AS YOU CALL BEFORE 4:00 PM

## 2020-09-28 NOTE — Progress Notes (Signed)
EKG CRITICAL VALUE     12 lead EKG performed.  Critical value noted.  Chantel Jeffries,CMA notified.   [place]to [place], CCT 09/28/2020 10:28 AM

## 2020-09-29 ENCOUNTER — Telehealth (HOSPITAL_COMMUNITY): Payer: Self-pay | Admitting: Cardiology

## 2020-09-29 DIAGNOSIS — I5022 Chronic systolic (congestive) heart failure: Secondary | ICD-10-CM

## 2020-09-29 MED ORDER — DIGOXIN 125 MCG PO TABS: 0.0625 mg | ORAL_TABLET | Freq: Every day | ORAL | 2 refills | Status: DC

## 2020-09-29 NOTE — Telephone Encounter (Signed)
Pt aware and voiced understanding 

## 2020-09-29 NOTE — Telephone Encounter (Signed)
-----   Message from Allayne Butcher, [place] sent at 09/28/2020  4:29 PM EST ----- Digoxin level elevated. Hold digoxin x 3 days then reduce down to 0.0625 mg daily. Repeat dig level in 1 week

## 2020-10-12 ENCOUNTER — Other Ambulatory Visit: Payer: Self-pay | Admitting: Pharmacist

## 2020-10-12 ENCOUNTER — Other Ambulatory Visit: Payer: Self-pay | Admitting: Physician Assistant

## 2020-10-12 ENCOUNTER — Other Ambulatory Visit (HOSPITAL_COMMUNITY): Payer: Self-pay | Admitting: *Deleted

## 2020-10-12 MED ORDER — CARVEDILOL 6.25 MG PO TABS: 6.2500 mg | ORAL_TABLET | Freq: Two times a day (BID) | ORAL | 11 refills | Status: DC

## 2020-10-12 MED ORDER — WARFARIN SODIUM 2.5 MG PO TABS: 2.5000 mg | ORAL_TABLET | Freq: Every day | ORAL | 2 refills | Status: DC

## 2020-10-12 MED ORDER — ATORVASTATIN CALCIUM 80 MG PO TABS: 80.0000 mg | ORAL_TABLET | Freq: Every day | ORAL | 2 refills | Status: DC

## 2020-10-12 MED ORDER — SPIRONOLACTONE 25 MG PO TABS: 25.0000 mg | ORAL_TABLET | Freq: Every day | ORAL | 2 refills | Status: DC

## 2020-10-14 ENCOUNTER — Other Ambulatory Visit: Payer: Self-pay | Admitting: Physician Assistant

## 2020-10-18 ENCOUNTER — Other Ambulatory Visit: Payer: Self-pay | Admitting: Thoracic Surgery (Cardiothoracic Vascular Surgery)

## 2020-10-18 DIAGNOSIS — Z951 Presence of aortocoronary bypass graft: Secondary | ICD-10-CM

## 2020-10-19 ENCOUNTER — Other Ambulatory Visit: Payer: Self-pay

## 2020-10-19 ENCOUNTER — Encounter: Payer: Self-pay | Admitting: Thoracic Surgery (Cardiothoracic Vascular Surgery)

## 2020-10-19 ENCOUNTER — Ambulatory Visit
Admission: RE | Admit: 2020-10-19 | Discharge: 2020-10-19 | Disposition: A | Discharge status: Home / Self Care | Payer: 59 | Source: Ambulatory Visit | Attending: Thoracic Surgery (Cardiothoracic Vascular Surgery) | Admitting: Thoracic Surgery (Cardiothoracic Vascular Surgery)

## 2020-10-19 ENCOUNTER — Ambulatory Visit: Payer: 59 | Admitting: Thoracic Surgery (Cardiothoracic Vascular Surgery)

## 2020-10-19 ENCOUNTER — Ambulatory Visit (INDEPENDENT_AMBULATORY_CARE_PROVIDER_SITE_OTHER): Payer: 59 | Admitting: *Deleted

## 2020-10-19 VITALS — BP 99/69 | HR 88 | Resp 20 | Ht 68.0 in | Wt 163.0 lb

## 2020-10-19 DIAGNOSIS — I824Y2 Acute embolism and thrombosis of unspecified deep veins of left proximal lower extremity: Secondary | ICD-10-CM | POA: Diagnosis not present

## 2020-10-19 DIAGNOSIS — Z951 Presence of aortocoronary bypass graft: Secondary | ICD-10-CM

## 2020-10-19 DIAGNOSIS — I4891 Unspecified atrial fibrillation: Secondary | ICD-10-CM | POA: Diagnosis not present

## 2020-10-19 DIAGNOSIS — Z5181 Encounter for therapeutic drug level monitoring: Secondary | ICD-10-CM

## 2020-10-19 LAB — POCT INR: INR: 2.2 (ref 2.0–3.0)

## 2020-10-19 NOTE — Patient Instructions (Signed)
Description   Continue taking Warfarin 1 tablet everyday except 1/2 tablet on Sundays, Tuesdays and Thursdays. Recheck INR in 5 weeks.  Coumadin Clinic [phone]

## 2020-10-19 NOTE — Progress Notes (Signed)
[address]       Jerry [name] [address]             [phone]       HPI: Jerry [name] returns for a scheduled follow up visit post CABG  Jerry [name] is a 50 year old man who presented back in October 2021 with an ST elevation MI.  He was late in the course of his MI.  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  The RCA was opened but he had severe residual disease.  His preoperative course was complicated by acute pulmonary edema and severe pericarditis.  He had to be intubated preoperatively.  He underwent coronary bypass grafting, mitral repair, and placement of an Impella left ventricular support device on 07/07/2020.  He had to go back to the operating room the following day for cardiac tamponade.  His Impella was removed on 07/15/2020.  His recovery was unremarkable after that and he went home on day 14.  I saw him on 08/17/2020.  He was doing well at that time.  His exercise tolerance was excellent.  He was noted to have an elevated left hemidiaphragm on his chest x-ray, but was asymptomatic from that.  He feels well.  He is walking 1.6 miles a day.  He is not having any pain.  He still has a little stiffness in his right shoulder but that is improving.  He is not having any respiratory issues.  He says sometimes he has to lay on his side to belch, but other than that he has no complaints.   Past Medical History:  Diagnosis Date  . Anxiety   . Coronary artery disease   . Myocardial infarction [facility])     Current Outpatient Medications  Medication Sig Dispense Refill  . acetaminophen (TYLENOL) 325 MG tablet Take 2 tablets (650 mg total) by mouth every 6 (six) hours as needed for mild pain.    Marland Kitchen ALPRAZolam (XANAX) 0.5 MG tablet Take 0.5 mg by mouth at bedtime as needed for sleep.     Marland Kitchen aspirin EC 81 MG EC tablet Take 1 tablet (81 mg total) by mouth daily. Swallow whole. 30 tablet 11  . atorvastatin (LIPITOR) 80  MG tablet Place 1 tablet (80 mg total) into feeding tube daily. 30 tablet 2  . carvedilol (COREG) 6.25 MG tablet Take 1 tablet (6.25 mg total) by mouth 2 (two) times daily with a meal. 60 tablet 11  . digoxin (LANOXIN) 0.125 MG tablet Take 0.5 tablets (0.0625 mg total) by mouth daily. 15 tablet 2  . empagliflozin (JARDIANCE) 10 MG TABS tablet Take 1 tablet (10 mg total) by mouth daily before breakfast. 30 tablet 11  . Multiple Vitamins-Minerals (CENTRUM ULTRA MENS) TABS Take 1 tablet by mouth.    . sacubitril-valsartan (ENTRESTO) 49-51 MG Take 1 tablet by mouth 2 (two) times daily. 60 tablet 6  . spironolactone (ALDACTONE) 25 MG tablet Take 1 tablet (25 mg total) by mouth daily. 30 tablet 2  . traZODone (DESYREL) 100 MG tablet Take 100 mg by mouth at bedtime.    Marland Kitchen warfarin (COUMADIN) 2.5 MG tablet Take 1 tablet (2.5 mg total) by mouth daily at 4 PM. Or as directed by the Coumadin Clinic. 30 tablet 2   No current facility-administered medications for this visit.    Physical Exam Ht 5\' 8"  (1.727 m)   BMI 23.89 kg/m  50 year old man  in no acute distress Alert and oriented x3 with no focal deficits Lungs decreased breath sounds left base, otherwise clear Cardiac regular rate and rhythm normal S1 and S2 with no murmur Incisions well-healed No peripheral edema  Diagnostic Tests: CHEST - 2 VIEW  COMPARISON:  08/17/2020  FINDINGS: Persistent elevation of the left hemidiaphragm. Mild left basilar atelectasis. Lungs are otherwise clear. No significant pleural effusion. No pneumothorax. Stable cardiomediastinal contours. No acute osseous abnormality.  IMPRESSION: Persistent elevation of left hemidiaphragm with mild left basilar atelectasis.   Electronically Signed   By: Guadlupe Spanish M.D.   On: 10/19/2020 09:12 I personally reviewed the chest x-ray images.  The elevated left diaphragm persists.  Impression: Jerry [name] is a 50 year old man who presented late in the course  of an ST elevation MI back in October.  His preoperative course was complicated by acute pulmonary edema requiring intubation and severe pericarditis.  He had coronary bypass grafting, mitral repair, and Impella placement on 07/07/2020.  The Impella was removed on day 8.  He then now went home on day 14.  At this point time he is doing extremely well.  His exercise tolerance is excellent.  He does not have any pain.  He has no orthopnea.  He does have an elevated left hemidiaphragm.  He really is not symptomatic from that.  That will often recover even as far as 9 months to a year out from surgery.  Interventions only indicated if he is symptomatic.  He is okay to return to work from my standpoint once he is cleared by the advanced heart failure team.  Plan: Return in 6 months with PA lateral chest x-ray  Loreli Slot, MD [facility] [phone]

## 2020-11-01 NOTE — Progress Notes (Incomplete)
***In Progress*** Referring Physician: PCP: Sigmund Hazel, MD PCP-Cardiologist: Tonny Bollman, MD  [facility]: Dr. Shirlee Latch  CT Surgery: Dr. Dorris Fetch   HPI:   50 y/o male s/p recent back surgery 05/2020, admitted for acute inferior and anterolateral MI on 06/25/20. LHC showed thrombotic occlusion of distal RCA that was treated with PTCA and thrombectomy. He was also noted to have chronic total occlusion of the LAD with collaterals from the right (thus this territory was affected by the RCA MI) as well as 90% complex proximal LCx stenosis. EF 25-30% + apical thrombus and severe mitral regurgitation. RV normal. Also with post MI pericarditis, treated with colchicine. Course further c/b cardiogenic shock and acute hypoxic respiratory failure with component of septic shock 2/2 PNA, requiring intubation and treatment with abx. Once stabilized, he underwent CABG x 3 (LIMA-LAD, Lt radial-OM1 and sequential SVG-PDA/PLA) + MV repair and placement of Impella 5.5 on 10/13 by Dr. Dorris Fetch.  Post operatively, he developed tamponade and was taken back to the OR on 10/14 with large hematoma obstructing right atrium. He improved and was extubated 10/19.  Impella removal 10/21 with stable hemodynamics and able to tolerate initiation of GDMT.  Also had post operative afib treated with amio. He was discharged home on 10/27.   He presented to clinic on 09/28/20 for routine follow-up. He was with his girlfriend. Has been doing well. Felt great. Denied ischemic CP. No pleuritic chest pain (just finished 3 months of colchicine). NYHA Class II. Denied orthopnea, PND or LEE. No palpitations. EKG on 09/28/20 showed NSR. Reported full compliance with coumadin and his INRs have been therapeutic. Denied falls. No abnormal bleeding. BP well controlled. No orthostatic symptoms. Compliant with HF meds.  Today he returns to HF clinic for pharmacist medication titration. At last visit with MD, Sherryll Burger was increased to 49-51 mg BID. Digoxin  dose was decreased to 0.0625 mg daily. Carvedilol was also increased to 6.25 mg BID on 10/12/20.  Overall feeling ***. Dizziness, lightheadedness, fatigue:  Chest pain or palpitations:  How is your breathing?: *** SOB: Able to complete all ADLs. Activity level ***  Weight at home pounds. Takes furosemide/torsemide/bumex *** mg *** daily.  LEE PND/Orthopnea  Appetite *** Low-salt diet:   Physical Exam Cost/affordability of meds   HF Medications: Carvedilol 6.25 mg BID with meal Entresto 49-51 mg BID Spironolactone 25 mg daily  Jardiance 10 mg daily before breakfast Digoxin 0.125 mg tabs, 1/2 tab (0.0625 mg) daily  Has the patient been experiencing any side effects to the medications prescribed?  {YES NO:22349}  Does the patient have any problems obtaining medications due to transportation or finances?   {YES NO:22349}  Understanding of regimen: {excellent/good/fair/poor:19665} Understanding of indications: {excellent/good/fair/poor:19665} Potential of compliance: {excellent/good/fair/poor:19665} Patient understands to avoid NSAIDs. Patient understands to avoid decongestants.    Pertinent Lab Values (09/28/20) . Serum creatinine 1.07, BUN 21, Potassium 4.8, Sodium 139, Digoxin 1.2  Vital Signs: . Weight: *** (last clinic weight: ***) . Blood pressure: ***  . Heart rate: ***   Assessment/Plan: 1. CAD s/p MI: Late presentation inferior MI 10/21. Cath showed thrombotic occlusion distal RCA, CTO LAD with collaterals from right, and complex 90% proximal LCx stenosis. He had PTCA/thrombectomy RCA with good flow at end of procedure. Suspect he had damage to LAD territory as well as RCA territory as RCA collateralized the LAD. S/p CABG w/ impella support with LIMA-LAD, left radial to OM, seq SVG-PDA/PLV. Post op course c/b tamponade and was taken back to the OR on 10/14  for drainage, found to have large hematoma obstructing right atrium.  - stable w/o ischemic CP  - continue  ASA, atorvastatin + carvedilol  2. Chronic Systolic Heart Failure: ischemic CM in setting of severe multivessel CAD, s/p CABG. EF 25-30%. RV ok. Volume status ok. NYHA Class II - Continue carvedilol 6.25 mg bid. - Increase Entresto to 49-51 mg bid - Continue spironolactone 25 mg daily - Continue Jardiance 10 mg daily   - Continue digoxin 0.0625 mg daily - Check BMP today and again in 7 days - Continue gradual med titration q2-3 weeks until fully optimized. Repeat echo and follow up with Dr. Shirlee Latch on 12/01/20. If EF remains < 35%, will refer to EP for potential ICD.   3. Severe MR - s/p MV repair at time of CABG 10/21  - denies CP and dyspnea   4. LV Thrombus  - continue coumadin  - INRs followed in coumadin clinic. Denies abnormal bleeding. Check CBC today   5. Post-operative Atrial Fibrillation  - remains in NSR on EKG today  - can stop amiodarone and will check TSH and HFTs  - continue coumadin (also on for LV thrombus)   6. Post MI Pericarditis:  - pleuritic CP resolved - he has completed colchicine for duration of 3 months   Karle Plumber, PharmD, BCPS, BCCP, CPP Heart Failure Clinic Pharmacist [phone]

## 2020-11-02 ENCOUNTER — Ambulatory Visit (HOSPITAL_COMMUNITY)
Admission: RE | Admit: 2020-11-02 | Discharge: 2020-11-02 | Disposition: A | Discharge status: Home / Self Care | Payer: 59 | Source: Ambulatory Visit | Attending: Cardiology | Admitting: Cardiology

## 2020-11-02 ENCOUNTER — Other Ambulatory Visit: Payer: Self-pay

## 2020-11-02 VITALS — BP 100/76 | HR 90 | Wt 161.2 lb

## 2020-11-02 DIAGNOSIS — I252 Old myocardial infarction: Secondary | ICD-10-CM | POA: Diagnosis not present

## 2020-11-02 DIAGNOSIS — Z7901 Long term (current) use of anticoagulants: Secondary | ICD-10-CM | POA: Insufficient documentation

## 2020-11-02 DIAGNOSIS — I340 Nonrheumatic mitral (valve) insufficiency: Secondary | ICD-10-CM | POA: Insufficient documentation

## 2020-11-02 DIAGNOSIS — Z7984 Long term (current) use of oral hypoglycemic drugs: Secondary | ICD-10-CM | POA: Diagnosis not present

## 2020-11-02 DIAGNOSIS — I2510 Atherosclerotic heart disease of native coronary artery without angina pectoris: Secondary | ICD-10-CM | POA: Diagnosis not present

## 2020-11-02 DIAGNOSIS — I5022 Chronic systolic (congestive) heart failure: Secondary | ICD-10-CM | POA: Diagnosis not present

## 2020-11-02 DIAGNOSIS — I513 Intracardiac thrombosis, not elsewhere classified: Secondary | ICD-10-CM | POA: Diagnosis not present

## 2020-11-02 DIAGNOSIS — Z951 Presence of aortocoronary bypass graft: Secondary | ICD-10-CM | POA: Insufficient documentation

## 2020-11-02 DIAGNOSIS — Z5181 Encounter for therapeutic drug level monitoring: Secondary | ICD-10-CM | POA: Diagnosis present

## 2020-11-02 DIAGNOSIS — Z79899 Other long term (current) drug therapy: Secondary | ICD-10-CM | POA: Diagnosis not present

## 2020-11-02 LAB — BASIC METABOLIC PANEL
Anion gap: 9 (ref 5–15)
BUN: 20 mg/dL (ref 6–20)
CO2: 26 mmol/L (ref 22–32)
Calcium: 9.3 mg/dL (ref 8.9–10.3)
Chloride: 105 mmol/L (ref 98–111)
Creatinine, Ser: 1.1 mg/dL (ref 0.61–1.24)
GFR, Estimated: >60 mL/min (ref >60)
Glucose, Bld: 103 mg/dL — ABNORMAL HIGH (ref 70–99)
Potassium: 4.3 mmol/L (ref 3.5–5.1)
Sodium: 140 mmol/L (ref 135–145)

## 2020-11-02 MED ORDER — ATORVASTATIN CALCIUM 80 MG PO TABS: 80.0000 mg | ORAL_TABLET | Freq: Every day | ORAL | 6 refills | Status: DC

## 2020-11-02 NOTE — Progress Notes (Signed)
Referring Physician: PCP: Sigmund Hazel, MD PCP-Cardiologist: Tonny Bollman, MD  [facility]: Dr. Shirlee Latch  CT Surgery: Dr. Dorris Fetch   HPI:   50 y/o male s/p recent back surgery 05/2020, admitted for acute inferior and anterolateral MI on 06/25/20. LHC showed thrombotic occlusion of distal RCA that was treated with PTCA and thrombectomy. He was also noted to have chronic total occlusion of the LAD with collaterals from the right (thus this territory was affected by the RCA MI) as well as 90% complex proximal LCx stenosis. EF 25-30% + apical thrombus and severe mitral regurgitation. RV normal. Also with post MI pericarditis, treated with colchicine. Course further c/b cardiogenic shock and acute hypoxic respiratory failure with component of septic shock 2/2 PNA, requiring intubation and treatment with abx. Once stabilized, he underwent CABG x 3 (LIMA-LAD, Lt radial-OM1 and sequential SVG-PDA/PLA) + MV repair and placement of Impella 5.5 on 07/07/20 by Dr. Dorris Fetch. Post operatively, he developed tamponade and was taken back to the OR on 07/08/20 with large hematoma obstructing right atrium. He improved and was extubated 07/13/20.  Impella removal 07/15/20 with stable hemodynamics and able to tolerate initiation of GDMT.  Also had post operative afib treated with amiodarone. He was discharged home on 07/21/20.   He presented to HF clinic on 09/28/20 for routine follow-up. He was with his girlfriend. Had been doing well. Felt great. Denied ischemic CP. No pleuritic chest pain (just finished 3 months of colchicine). NYHA Class II. Denied orthopnea, PND or LEE. No palpitations. EKG on 09/28/20 showed NSR. Reported full compliance with coumadin and his INRs had been therapeutic. Denied falls. No abnormal bleeding. BP was well controlled. No orthostatic symptoms. Compliant with HF meds.  Today he returns to HF clinic for pharmacist medication titration. At last visit with PA, Entresto was increased to 49-51 mg BID and  amiodarone was discontinued. Additionally, digoxin was decreased to 0.0625 mg daily due to supratherapeutic level. Overall feeling fine today. Does experience dizziness anytime he has to bend down and get back up. Tends to be brief episodes but notes they have been much more frequent since increasing the Entresto. He checks his blood pressure at home and it tends to be in the 90s/60s. BP was 100/76 in clinic. Also reports feeling groggy over the past month, hard to get out of bed. No chest pain or palpitations. No SOB/DOE. Reports lifting weights and walking on treadmill 30 minutes per day without getting winded. Weight at home has remained stable at 160 pounds. No LEE/PND/orthopnea. No diuretic use at home.   HF Medications: Carvedilol 6.25 mg BID  Entresto 49-51 mg BID Spironolactone 25 mg daily  Jardiance 10 mg daily Digoxin 0.0625 mg daily  Has the patient been experiencing any side effects to the medications prescribed?  Yes, patient reports dizziness anytime he has to bend down and get back up, though it tends to be brief.  Does the patient have any problems obtaining medications due to transportation or finances?   No, patient has [employer].   Understanding of regimen: good Understanding of indications: good Potential of compliance: good Patient understands to avoid NSAIDs. Patient understands to avoid decongestants.    Pertinent Lab Values (11/02/2020) . Serum creatinine 1.10, BUN 20, Potassium 4.3, Sodium 140  Vital Signs: . Weight: 161.2 lbs (last clinic weight: 163 lbs) . Blood pressure: 100/76  . Heart rate: 90   Assessment/Plan: 1. CAD s/p MI: Late presentation inferior MI 06/2020. Cath showed thrombotic occlusion of distal RCA, CTO LAD  with collaterals from right, and complex 90% proximal LCx stenosis. He had PTCA/thrombectomy RCA with good flow at end of procedure. Suspect he had damage to LAD territory as well as RCA territory as RCA  collateralized the LAD. S/p CABG with impella support with LIMA-LAD, left radial to OM, seq SVG-PDA/PLV. Post op course c/b tamponade and was taken back to the OR on 07/08/20 for drainage, found to have large hematoma obstructing right atrium.  - stable without ischemic CP  - continue ASA, atorvastatin, carvedilol  2. Chronic Systolic Heart Failure: ischemic CM in setting of severe multivessel CAD, s/p CABG. EF 25-30%. RV okay.  - NYHA Class II, euvolemic on exam  - Continue carvedilol 6.25 mg BID. - Continue Entresto 49-51 mg BID - Continue spironolactone 25 mg daily - Continue Jardiance 10 mg daily   - Continue digoxin 0.0625 mg daily - No medication changes made during today's visit due to low blood pressures. Patient's blood pressure remains in the 90s/60s at home and has had episodes of dizziness which have been more common since increasing his Entresto. At this time patient does not wish to decrease any medications to address dizziness. Will monitor closely.  - Repeat echo and follow up with Dr. Shirlee Latch on 12/01/20. If EF remains < 35%, will refer to EP for potential ICD.   3. Severe MR - s/p MV repair at time of CABG 06/2020  - denies CP and dyspnea   4. LV Thrombus  - continue coumadin  - INRs followed in coumadin clinic. Denies abnormal bleeding.   5. Post-operative Atrial Fibrillation  - remained in NSR on EKG on 09/28/2020 - patient no longer on amiodarone - continue coumadin (also on for LV thrombus)   6. Post MI Pericarditis:  - pleuritic CP resolved - he has completed colchicine for duration of 3 months   Jerry [name], PharmD, BCPS, BCCP, CPP Heart Failure Clinic Pharmacist [phone]

## 2020-11-02 NOTE — Patient Instructions (Addendum)
It was a pleasure seeing you today!  MEDICATIONS: -No medication changes today -Call if you have questions about your medications.  LABS: -We will call you if your labs need attention.  NEXT APPOINTMENT: Return to clinic in 1 month with Dr. McLean.  In general, to take care of your heart failure: -Limit your fluid intake to 2 Liters (half-gallon) per day.   -Limit your salt intake to ideally 2-3 grams (2000-3000 mg) per day. -Weigh yourself daily and record, and bring that "weight diary" to your next appointment.  (Weight gain of 2-3 pounds in 1 day typically means fluid weight.) -The medications for your heart are to help your heart and help you live longer.   -Please contact us before stopping any of your heart medications.  Call the clinic at [phone] with questions or to reschedule future appointments.  

## 2020-11-17 ENCOUNTER — Encounter (HOSPITAL_COMMUNITY): Payer: Self-pay

## 2020-11-23 ENCOUNTER — Ambulatory Visit (INDEPENDENT_AMBULATORY_CARE_PROVIDER_SITE_OTHER): Payer: 59

## 2020-11-23 ENCOUNTER — Other Ambulatory Visit: Payer: Self-pay

## 2020-11-23 DIAGNOSIS — I824Y2 Acute embolism and thrombosis of unspecified deep veins of left proximal lower extremity: Secondary | ICD-10-CM

## 2020-11-23 DIAGNOSIS — I4891 Unspecified atrial fibrillation: Secondary | ICD-10-CM

## 2020-11-23 DIAGNOSIS — Z5181 Encounter for therapeutic drug level monitoring: Secondary | ICD-10-CM

## 2020-11-23 LAB — POCT INR: INR: 2.9 (ref 2.0–3.0)

## 2020-11-23 NOTE — Patient Instructions (Signed)
Description   Continue taking Warfarin 1 tablet everyday except 1/2 tablet on Sundays, Tuesdays and Thursdays. Recheck INR in 6 weeks.  Coumadin Clinic [phone]

## 2020-12-01 ENCOUNTER — Other Ambulatory Visit: Payer: Self-pay

## 2020-12-01 ENCOUNTER — Ambulatory Visit (HOSPITAL_BASED_OUTPATIENT_CLINIC_OR_DEPARTMENT_OTHER)
Admission: RE | Admit: 2020-12-01 | Discharge: 2020-12-01 | Disposition: A | Discharge status: Home / Self Care | Payer: 59 | Source: Ambulatory Visit | Attending: Cardiology | Admitting: Cardiology

## 2020-12-01 ENCOUNTER — Encounter (HOSPITAL_COMMUNITY): Payer: Self-pay | Admitting: Cardiology

## 2020-12-01 ENCOUNTER — Ambulatory Visit (HOSPITAL_COMMUNITY)
Admission: RE | Admit: 2020-12-01 | Discharge: 2020-12-01 | Disposition: A | Discharge status: Home / Self Care | Payer: 59 | Source: Ambulatory Visit | Attending: Cardiology | Admitting: Cardiology

## 2020-12-01 VITALS — BP 90/50 | HR 78 | Wt 157.0 lb

## 2020-12-01 DIAGNOSIS — Z79899 Other long term (current) drug therapy: Secondary | ICD-10-CM | POA: Insufficient documentation

## 2020-12-01 DIAGNOSIS — Z7984 Long term (current) use of oral hypoglycemic drugs: Secondary | ICD-10-CM | POA: Diagnosis not present

## 2020-12-01 DIAGNOSIS — Z951 Presence of aortocoronary bypass graft: Secondary | ICD-10-CM | POA: Diagnosis not present

## 2020-12-01 DIAGNOSIS — Z86718 Personal history of other venous thrombosis and embolism: Secondary | ICD-10-CM | POA: Insufficient documentation

## 2020-12-01 DIAGNOSIS — I255 Ischemic cardiomyopathy: Secondary | ICD-10-CM | POA: Diagnosis not present

## 2020-12-01 DIAGNOSIS — I4891 Unspecified atrial fibrillation: Secondary | ICD-10-CM | POA: Diagnosis not present

## 2020-12-01 DIAGNOSIS — I252 Old myocardial infarction: Secondary | ICD-10-CM | POA: Diagnosis not present

## 2020-12-01 DIAGNOSIS — I5022 Chronic systolic (congestive) heart failure: Secondary | ICD-10-CM | POA: Insufficient documentation

## 2020-12-01 DIAGNOSIS — I2510 Atherosclerotic heart disease of native coronary artery without angina pectoris: Secondary | ICD-10-CM | POA: Insufficient documentation

## 2020-12-01 DIAGNOSIS — I480 Paroxysmal atrial fibrillation: Secondary | ICD-10-CM

## 2020-12-01 DIAGNOSIS — Z7901 Long term (current) use of anticoagulants: Secondary | ICD-10-CM | POA: Diagnosis not present

## 2020-12-01 DIAGNOSIS — Z8249 Family history of ischemic heart disease and other diseases of the circulatory system: Secondary | ICD-10-CM | POA: Diagnosis not present

## 2020-12-01 DIAGNOSIS — I340 Nonrheumatic mitral (valve) insufficiency: Secondary | ICD-10-CM | POA: Diagnosis not present

## 2020-12-01 DIAGNOSIS — Z7189 Other specified counseling: Secondary | ICD-10-CM | POA: Diagnosis not present

## 2020-12-01 DIAGNOSIS — I5042 Chronic combined systolic (congestive) and diastolic (congestive) heart failure: Secondary | ICD-10-CM

## 2020-12-01 DIAGNOSIS — Z7982 Long term (current) use of aspirin: Secondary | ICD-10-CM | POA: Insufficient documentation

## 2020-12-01 DIAGNOSIS — Z9889 Other specified postprocedural states: Secondary | ICD-10-CM

## 2020-12-01 DIAGNOSIS — I2582 Chronic total occlusion of coronary artery: Secondary | ICD-10-CM | POA: Diagnosis not present

## 2020-12-01 DIAGNOSIS — Z8679 Personal history of other diseases of the circulatory system: Secondary | ICD-10-CM | POA: Diagnosis not present

## 2020-12-01 LAB — CBC
HCT: 50 % (ref 39.0–52.0)
Hemoglobin: 16.3 g/dL (ref 13.0–17.0)
MCH: 28.1 pg (ref 26.0–34.0)
MCHC: 32.6 g/dL (ref 30.0–36.0)
MCV: 86.2 fL (ref 80.0–100.0)
Platelets: 191 10*3/uL (ref 150–400)
RBC: 5.8 MIL/uL (ref 4.22–5.81)
RDW: 17.5 % — ABNORMAL HIGH (ref 11.5–15.5)
WBC: 12.8 10*3/uL — ABNORMAL HIGH (ref 4.0–10.5)
nRBC: 0 % (ref 0.0–0.2)

## 2020-12-01 LAB — DIGOXIN LEVEL: Digoxin Level: 0.5 ng/mL — ABNORMAL LOW (ref 0.8–2.0)

## 2020-12-01 LAB — BASIC METABOLIC PANEL
Anion gap: 9 (ref 5–15)
BUN: 19 mg/dL (ref 6–20)
CO2: 24 mmol/L (ref 22–32)
Calcium: 9.6 mg/dL (ref 8.9–10.3)
Chloride: 104 mmol/L (ref 98–111)
Creatinine, Ser: 1.12 mg/dL (ref 0.61–1.24)
GFR, Estimated: >60 mL/min (ref >60)
Glucose, Bld: 100 mg/dL — ABNORMAL HIGH (ref 70–99)
Potassium: 4.8 mmol/L (ref 3.5–5.1)
Sodium: 137 mmol/L (ref 135–145)

## 2020-12-01 LAB — ECHOCARDIOGRAM COMPLETE
Area-P 1/2: 3.17 cm2
Calc EF: 26.5 %
S' Lateral: 5.3 cm
Single Plane A2C EF: 35.1 %
Single Plane A4C EF: 17.1 %

## 2020-12-01 MED ORDER — SPIRONOLACTONE 25 MG PO TABS: 25.0000 mg | ORAL_TABLET | Freq: Every day | ORAL | 3 refills | Status: DC

## 2020-12-01 MED ORDER — PERFLUTREN LIPID MICROSPHERE
1.0000 mL | INTRAVENOUS | Status: DC | PRN
  Administered 2020-12-01: 2 mL via INTRAVENOUS
  Filled 2020-12-01: qty 10

## 2020-12-01 NOTE — Progress Notes (Signed)
Advanced [facility] Failure Clinic Note  PCP: Sigmund Hazel, MD HF Cardiology: Dr. Shirlee Latch  CT Surgery: Dr. Dorris Fetch   50 y.o. male s/p back surgery 05/2020 was admitted for acute inferior and anterolateral MI on 06/25/20. LHC showed thrombotic occlusion of distal RCA that was treated with PTCA and thrombectomy. He was also noted to have chronic total occlusion of the LAD with collaterals from the right (thus this territory was affected by the RCA MI) as well as 90% complex proximal LCx stenosis. EF 25-30% + apical thrombus and severe mitral regurgitation. RV normal. Also w/ post MI pericarditis, treated w/ colchicine. Course further c/b cardiogenic shock and acute hypoxic respiratory failure w/ component of septic shock 2/2 PNA, requiring intubation and treatment w/ abx. Once stabilized, he underwent CABG x 3 (LIMA-LAD, Lt radial-OM1 and sequential SVG-PDA/PLA) + MV repair and placement of Impella 5.5 on 10/13 by Dr. Dorris Fetch.   Post operatively, he developed tamponade and was taken back to the OR on 10/14 with large hematoma obstructing right atrium. He improved and was extubated 10/19.  Impella removal 10/21 w/ stable hemodynamics and able to tolerate initiation of GDMT.  Also had post-operative afib treated w/ amiodarone. He was discharged home on 07/21/20.   Echo was done today and reviewed, EF 20-25% with diffuse hypokinesis, mildly decreased RV systolic function, no LV thrombus, normal IVC, s/p MV repair with trivial MR and mild stenosis (mean gradient 4 mmHg).   He presents to clinic today for followup of CHF. Here w/ his girlfriend.  He has been doing well symptomatically.  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 Did not do cardiac rehab but walks 1.5 miles/day.  No chest pain or exertional dyspnea.  No orthopnea/PND. He occasionally gets lightheaded if he stands up too fast but no falls or syncope.     ECG (personally reviewed): NSR, old inferior MI, old anterolateral MI  Labs  (11/21): LDL 59, HDL 34 Labs (1/22): digoxin level 1.2 Labs (2/22): K 4.3, creatinine 1.1  Review of systems complete and found to be negative unless listed in HPI.    PMH: 1. Anxiety 2. Hyperlipidemia 3. CAD: Inferior and anterolateral MI 10/21.  LHC showed thrombotic occlusion distal RCA treated with PTCA/thrombectomy, CTO LAD with R=>L collaterals, 90% complex proximal LCx stenosis.  - CABG x 4 in 10/21 with LIMA-LAD, left radial-OM1, sequential SVG-PDA/PLV.  4. Mitral regurgitation: Suspect infarct-related MR.   - Mitral valve repair in 10/21 with CABG.  5. LV thrombus  6. Chronic systolic CHF: ischemic cardiomyopathy.  Echo (10/21) with EF 25-30%, apical thrombus, severe MR, normal RV.  - Echo (3/22): EF 20-25% with diffuse hypokinesis, mildly decreased RV systolic function, no LV thrombus, normal IVC, s/p MV repair with trivial MR and mild stenosis (mean gradient 4 mmHg).   7. Post-infarct pericarditis in 10/21. 8. Atrial fibrillation: Post-op in 10/21.  9. H/o back surgery 9/21: Discectomy.     Current Outpatient Medications  Medication Sig Dispense Refill  . acetaminophen (TYLENOL) 325 MG tablet Take 2 tablets (650 mg total) by mouth every 6 (six) hours as needed for mild pain.    Marland Kitchen ALPRAZolam (XANAX) 0.5 MG tablet Take 0.5 mg by mouth at bedtime as needed for sleep.     Marland Kitchen aspirin EC 81 MG EC tablet Take 1 tablet (81 mg total) by mouth daily. Swallow whole. 30 tablet 11  . atorvastatin (LIPITOR) 80 MG tablet Take 1 tablet (80 mg total) by mouth daily. 30 tablet 6  . carvedilol (  COREG) 6.25 MG tablet Take 1 tablet (6.25 mg total) by mouth 2 (two) times daily with a meal. 60 tablet 11  . digoxin (LANOXIN) 0.125 MG tablet Take 0.5 tablets (0.0625 mg total) by mouth daily. 15 tablet 2  . empagliflozin (JARDIANCE) 10 MG TABS tablet Take 1 tablet (10 mg total) by mouth daily before breakfast. 30 tablet 11  . Multiple Vitamins-Minerals (CENTRUM ULTRA MENS) TABS Take 1 tablet by mouth.     . sacubitril-valsartan (ENTRESTO) 49-51 MG Take 1 tablet by mouth 2 (two) times daily. 60 tablet 6  . traZODone (DESYREL) 100 MG tablet Take 100 mg by mouth at bedtime.    Marland Kitchen warfarin (COUMADIN) 2.5 MG tablet Take 1 tablet (2.5 mg total) by mouth daily at 4 PM. Or as directed by the [facility]. 30 tablet 2  . spironolactone (ALDACTONE) 25 MG tablet Take 1 tablet (25 mg total) by mouth at bedtime. 90 tablet 3   No current facility-administered medications for this encounter.    No Known Allergies    Social History   Socioeconomic History  . Marital status: Single    Spouse name: Not on file  . Number of children: Not on file  . Years of education: Not on file  . Highest education level: Not on file  Occupational History    Employer: [employer]  Tobacco Use  . Smoking status: Never Smoker  . Smokeless tobacco: Never Used  Vaping Use  . Vaping Use: Never used  Substance and Sexual Activity  . Alcohol use: Yes    Alcohol/week: 1.0 standard drink    Types: 1 Standard drinks or equivalent per week  . Drug use: No  . Sexual activity: Yes    Birth control/protection: None  Other Topics Concern  . Not on file  Social History Narrative  . Not on file   Social Determinants of Health   Financial Resource Strain: Not on file  Food Insecurity: Not on file  Transportation Needs: Not on file  Physical Activity: Not on file  Stress: Not on file  Social Connections: Not on file  Intimate Partner Violence: Not on file    FH: No family history of premature CAD.   Vitals:   12/01/20 1122  BP: (!) 90/50  Pulse: 78  SpO2: 98%  Weight: 71.2 kg (157 lb)    PHYSICAL EXAM: General: NAD Neck: No JVD, no thyromegaly or thyroid nodule.  Lungs: Clear to auscultation bilaterally with normal respiratory effort. CV: Nondisplaced PMI.  Heart regular S1/S2, no S3/S4, no murmur.  No peripheral edema.  No carotid bruit.  Normal pedal pulses.  Abdomen: Soft, nontender, no  hepatosplenomegaly, no distention.  Skin: Intact without lesions or rashes.  Neurologic: Alert and oriented x 3.  Psych: Normal affect. Extremities: No clubbing or cyanosis.  HEENT: Normal.   ASSESSMENT & PLAN:  1. CAD: Late presentation inferior and anterolateral MI 10/21. Cath showed thrombotic occlusion distal RCA, CTO LAD with collaterals from right, and complex 90% proximal LCx stenosis. He had PTCA/thrombectomy RCA with good flow at end of procedure. Suspect he had damage to LAD territory as well as RCA territory as RCA collateralized the LAD. S/p CABG w/ impella support with LIMA-LAD, left radial to OM, seq SVG-PDA/PLV. Post op course c/b tamponade and was taken back to the OR on 10/14 for drainage, found to have large hematoma obstructing right atrium.  No chest pain.  - Continue ASA 81 daily.  - Continue atorvastatin 80 mg daily.  Good lipids in 11/21.  2. 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 Echo in 10/21 with EF 25-30%.  Echo was done today, showing EF 20-25%, diffuse hypokinesis, mildly decreased RV systolic function.  He is not volume overloaded on exam.  NYHA Class II symptoms.  - Continue Entresto 49-51 mg bid, no BP room to increase.  - Continue Spironolactone 25 mg daily.  With soft BP, take this med qhs. - Continue Coreg 6.25 mg bid, no BP room to increase.  - Continue Digoxin. Check level (dose decreased recently).  - Continue Jardiance 10 mg daily.  - BMET today.  - Narrow QRS so not CRT candidate.  With persistently low EF and ischemic cardiomyopathy, I recommended ICD today.  I will refer him for EP evaluation.  3. Severe MR: Suspect infarct-related MR. s/p MV repair at time of CABG 10/21.  Echo in 3/22 showed trivial MR, mild mitral stenosis with mean gradient 4 mmHg.  4. LV thrombus: Not present on today's echo.  - Continue warfarin.  5. Post-operative atrial fibrillation: He is in NSR today. He is on warfarin,  off amiodarone.  6. Post-MI pericarditis: Now off colchicine.   Followup in 2 months.    Marca Ancona, MD 12/01/20

## 2020-12-01 NOTE — Patient Instructions (Signed)
EKG done today.  Labs done today. We will contact you only if your labs are abnormal.  START taking Spironolactone daily at bedtime  No other medication changes were made. Please continue all current medications as prescribed.  You have been referred to [facility] Cardiac Electrophysiology. They will contact you to schedule an appointment.   Your physician recommends that you schedule a follow-up appointment in: 2 months   If you have any questions or concerns before your next appointment please send [place] a message through [place] or call our office at [phone].    TO LEAVE A MESSAGE FOR THE NURSE SELECT OPTION 2, PLEASE LEAVE A MESSAGE INCLUDING: . YOUR NAME . DATE OF BIRTH . CALL BACK NUMBER . REASON FOR CALL**this is important as we prioritize the call backs  YOU WILL RECEIVE A CALL BACK THE SAME DAY AS LONG AS YOU CALL BEFORE 4:00 PM   Do the following things EVERYDAY: 1) Weigh yourself in the morning before breakfast. Write it down and keep it in a log. 2) Take your medicines as prescribed 3) Eat low salt foods--Limit salt (sodium) to 2000 mg per day.  4) Stay as active as you can everyday 5) Limit all fluids for the day to less than 2 liters   At the [facility], you and your health needs are our priority. As part of our continuing mission to provide you with exceptional heart care, we have created designated Provider Care Teams. These Care Teams include your primary Cardiologist (physician) and Advanced Practice Providers (APPs- Physician Assistants and Nurse Practitioners) who all work together to provide you with the care you need, when you need it.   You may see any of the following providers on your designated Care Team at your next follow up: Marland Kitchen Dr Arvilla Meres . Dr Marca Ancona . Tonye Becket, NP . Robbie Lis, PA . Karle Plumber, PharmD   Please be sure to bring in all your medications bottles to every appointment.

## 2020-12-01 NOTE — Progress Notes (Signed)
  Echocardiogram 2D Echocardiogram has been performed.  Jerry [name] 12/01/2020, 10:59 AM

## 2020-12-16 DIAGNOSIS — I5022 Chronic systolic (congestive) heart failure: Secondary | ICD-10-CM | POA: Insufficient documentation

## 2020-12-21 ENCOUNTER — Other Ambulatory Visit: Payer: Self-pay

## 2020-12-21 ENCOUNTER — Encounter: Payer: Self-pay | Admitting: Internal Medicine

## 2020-12-21 ENCOUNTER — Ambulatory Visit: Payer: 59 | Admitting: Internal Medicine

## 2020-12-21 VITALS — BP 112/74 | HR 86 | Ht 68.0 in | Wt 157.8 lb

## 2020-12-21 DIAGNOSIS — I4891 Unspecified atrial fibrillation: Secondary | ICD-10-CM

## 2020-12-21 DIAGNOSIS — Z01812 Encounter for preprocedural laboratory examination: Secondary | ICD-10-CM | POA: Diagnosis not present

## 2020-12-21 DIAGNOSIS — Z79899 Other long term (current) drug therapy: Secondary | ICD-10-CM | POA: Diagnosis not present

## 2020-12-21 DIAGNOSIS — I5022 Chronic systolic (congestive) heart failure: Secondary | ICD-10-CM | POA: Diagnosis not present

## 2020-12-21 NOTE — Progress Notes (Addendum)
ELECTROPHYSIOLOGY CONSULT NOTE  Patient ID: Jerry [name], MRN: [id number], DOB/AGE: October 02, 1970 51 y.o. Admit date: (Not on file) Date of Consult: 12/21/2020  Primary Physician: Sigmund Hazel, MD Primary Cardiologist: DM     HPI Jerry [name] is a 50 y.o. male referred for consideration of an ICD for primary prevention at the request of Dr. Shirlee Latch   Presented 10/21 with acute inferior/anterolateral MI shortly after back surgery.  Found to have severe three-vessel disease mitral regurgitation and an apical thrombus.  Course was complicated by cardiogenic shock and septic pneumonia.  Ultimately underwent CABG x4 (LIMA-LAD, left radial-OM1, sequential SVG-PDA/PLV) and mitral valve repair with Impella support.  Surgery was complicated by local tamponade of the right atrium.  Postoperative atrial fibrillation was treated with amiodarone.  He has persistent LV dysfunction  He is able to walk without dyspnea.  He is able to climb stairs.  No nocturnal dyspnea orthopnea or peripheral edema  He has been aggressively managed with guideline directed therapy  He has need for repeat back surgery  DATE TEST EF   10/21 LHC 25-30 % LAD-T (R>>L); CXp-90% RCAd-T>>POBA  10/21 Echo   * % MR mod  3/*22 Echo  20-25%    Date Cr K Dig Hgb  3/22 1.12 4.8 0.5 16.3             Past Medical History:  Diagnosis Date  . Anxiety   . Coronary artery disease   . Myocardial infarction [facility])       Surgical History:  Past Surgical History:  Procedure Laterality Date  . BACK SURGERY    . CARDIAC CATHETERIZATION    . CORONARY ANGIOPLASTY    . CORONARY ARTERY BYPASS GRAFT N/A 07/07/2020   Procedure: CORONARY ARTERY BYPASS GRAFTING (CABG) x FOUR, USING LEFT RADIAL ARTERY AND RIGHT LEG GREATER SAPHENOUS VEIN HARVESTED ENDOSCOPICALLY;  Surgeon: Loreli Slot, MD;  Location: [facility] OR;  Service: Open Heart Surgery;  Laterality: N/A;  . CORONARY BALLOON ANGIOPLASTY N/A 06/25/2020   Procedure:  CORONARY BALLOON ANGIOPLASTY;  Surgeon: Tonny Bollman, MD;  Location: [facility] INVASIVE CV LAB;  Service: Cardiovascular;  Laterality: N/A;  . CORONARY THROMBECTOMY N/A 06/25/2020   Procedure: Coronary Thrombectomy;  Surgeon: Tonny Bollman, MD;  Location: [facility] INVASIVE CV LAB;  Service: Cardiovascular;  Laterality: N/A;  . CORONARY/GRAFT ACUTE MI REVASCULARIZATION N/A 06/26/2020   Procedure: Coronary/Graft Acute MI Revascularization;  Surgeon: Tonny Bollman, MD;  Location: [facility] INVASIVE CV LAB;  Service: Cardiovascular;  Laterality: N/A;  . ENDOVEIN HARVEST OF GREATER SAPHENOUS VEIN Right 07/07/2020   Procedure: ENDOVEIN HARVEST OF GREATER SAPHENOUS VEIN;  Surgeon: Loreli Slot, MD;  Location: [facility] OR;  Service: Open Heart Surgery;  Laterality: Right;  . EXPLORATION POST OPERATIVE OPEN HEART N/A 07/08/2020   Procedure: EXPLORATION POST OPERATIVE OPEN HEART TAMPONADE;  Surgeon: Loreli Slot, MD;  Location: [facility] OR;  Service: Open Heart Surgery;  Laterality: N/A;  . FRACTURE SURGERY    . LEFT HEART CATH AND CORONARY ANGIOGRAPHY N/A 06/25/2020   Procedure: LEFT HEART CATH AND CORONARY ANGIOGRAPHY;  Surgeon: Tonny Bollman, MD;  Location: [facility] INVASIVE CV LAB;  Service: Cardiovascular;  Laterality: N/A;  . PLACEMENT OF IMPELLA LEFT VENTRICULAR ASSIST DEVICE Right 07/07/2020   Procedure: PLACEMENT OF IMPELLA LEFT VENTRICULAR ASSIST DEVICE USING ABIOMED IMPELLA 5.5;  Surgeon: Loreli Slot, MD;  Location: [facility] OR;  Service: Open Heart Surgery;  Laterality: Right;  . PLACEMENT OF IMPELLA LEFT VENTRICULAR ASSIST DEVICE N/A 07/15/2020  Procedure: REMOVAL OF IMPELLA LEFT VENTRICULAR ASSIST DEVICE;  Surgeon: Loreli Slot, MD;  Location: [facility] OR;  Service: Open Heart Surgery;  Laterality: N/A;  . RADIAL ARTERY HARVEST Left 07/07/2020   Procedure: RADIAL ARTERY HARVEST left;  Surgeon: Loreli Slot, MD;  Location: [facility] OR;  Service: Open Heart Surgery;  Laterality: Left;  . right  clavicle fracture reapir  2010  . RIGHT HEART CATH N/A 06/26/2020   Procedure: RIGHT HEART CATH;  Surgeon: Tonny Bollman, MD;  Location: [facility] INVASIVE CV LAB;  Service: Cardiovascular;  Laterality: N/A;  . TEE WITHOUT CARDIOVERSION N/A 07/07/2020   Procedure: TRANSESOPHAGEAL ECHOCARDIOGRAM (TEE);  Surgeon: Loreli Slot, MD;  Location: [facility] OR;  Service: Open Heart Surgery;  Laterality: N/A;     Home Meds: Current Meds  Medication Sig  . acetaminophen (TYLENOL) 325 MG tablet Take 2 tablets (650 mg total) by mouth every 6 (six) hours as needed for mild pain.  Marland Kitchen ALPRAZolam (XANAX) 0.5 MG tablet Take 0.5 mg by mouth at bedtime as needed for sleep.   Marland Kitchen aspirin EC 81 MG EC tablet Take 1 tablet (81 mg total) by mouth daily. Swallow whole.  Marland Kitchen atorvastatin (LIPITOR) 80 MG tablet Take 1 tablet (80 mg total) by mouth daily.  . carvedilol (COREG) 6.25 MG tablet Take 1 tablet (6.25 mg total) by mouth 2 (two) times daily with a meal.  . digoxin (LANOXIN) 0.125 MG tablet Take 0.5 tablets (0.0625 mg total) by mouth daily.  . empagliflozin (JARDIANCE) 10 MG TABS tablet Take 1 tablet (10 mg total) by mouth daily before breakfast.  . Multiple Vitamins-Minerals (CENTRUM ULTRA MENS) TABS Take 1 tablet by mouth.  . sacubitril-valsartan (ENTRESTO) 49-51 MG Take 1 tablet by mouth 2 (two) times daily.  Marland Kitchen spironolactone (ALDACTONE) 25 MG tablet Take 1 tablet (25 mg total) by mouth at bedtime.  . traZODone (DESYREL) 100 MG tablet Take 100 mg by mouth at bedtime.  Marland Kitchen warfarin (COUMADIN) 2.5 MG tablet Take 1 tablet (2.5 mg total) by mouth daily at 4 PM. Or as directed by the [facility].    Allergies: No Known Allergies  Social History   Socioeconomic History  . Marital status: Single    Spouse name: Not on file  . Number of children: Not on file  . Years of education: Not on file  . Highest education level: Not on file  Occupational History    Employer: [employer]  Tobacco Use  . Smoking  status: Never Smoker  . Smokeless tobacco: Never Used  Vaping Use  . Vaping Use: Never used  Substance and Sexual Activity  . Alcohol use: Yes    Alcohol/week: 1.0 standard drink    Types: 1 Standard drinks or equivalent per week  . Drug use: No  . Sexual activity: Yes    Birth control/protection: None  Other Topics Concern  . Not on file  Social History Narrative  . Not on file   Social Determinants of Health   Financial Resource Strain: Not on file  Food Insecurity: Not on file  Transportation Needs: Not on file  Physical Activity: Not on file  Stress: Not on file  Social Connections: Not on file  Intimate Partner Violence: Not on file     History reviewed. No pertinent family history.   ROS:  Please see the history of present illness.     All other systems reviewed and negative.    Physical Exam:  Blood pressure 112/74, pulse 86, height  5\' 8"  (1.727 m), weight 157 lb 12.8 oz (71.6 kg), SpO2 95 %. General: Well developed, well nourished male in no acute distress. Head: Normocephalic, atraumatic, sclera non-icteric, no xanthomas, nares are without discharge. EENT: normal  Lymph Nodes:  none Neck: Negative for carotid bruits. JVD not elevated. Back:without scoliosis kyphosis  Lungs: Clear bilaterally to auscultation without wheezes, rales, or rhonchi. Breathing is unlabored. Heart: RRR with S1 S2. No  * murmur . No rubs, or gallops appreciated. Abdomen: Soft, non-tender, non-distended with normoactive bowel sounds. No hepatomegaly. No rebound/guarding. No obvious abdominal masses. Msk:  Strength and tone appear normal for age. Extremities: No clubbing or cyanosis. No + edema.  Distal pedal pulses are 2+ and equal bilaterally. Skin: Warm and Dry Neuro: Alert and oriented X 3. CN III-XII intact Grossly normal sensory and motor function . Psych:  Responds to questions appropriately with a normal affect.      Labs: Cardiac Enzymes No results for input(s): CKTOTAL,  CKMB, TROPONINI in the last 72 hours. CBC Lab Results  Component Value Date   WBC 12.8 (H) 12/01/2020   HGB 16.3 12/01/2020   HCT 50.0 12/01/2020   MCV 86.2 12/01/2020   PLT 191 12/01/2020   PROTIME: No results for input(s): LABPROT, INR in the last 72 hours. Chemistry No results for input(s): NA, K, CL, CO2, BUN, CREATININE, CALCIUM, PROT, BILITOT, ALKPHOS, ALT, AST, GLUCOSE in the last 168 hours.  Invalid input(s): LABALBU Lipids Lab Results  Component Value Date   CHOL 121 08/02/2020   HDL 34 (L) 08/02/2020   LDLCALC 59 08/02/2020   TRIG 139 08/02/2020   BNP No results found for: PROBNP Thyroid Function Tests: No results for input(s): TSH, T4TOTAL, T3FREE, THYROIDAB in the last 72 hours.  Invalid input(s): FREET3 Miscellaneous Lab Results  Component Value Date   DDIMER 1.05 (H) 07/08/2020    Radiology/Studies:  ECHOCARDIOGRAM COMPLETE  Result Date: 12/01/2020    ECHOCARDIOGRAM REPORT   Patient Name:   Jerry LentREVOR Gartner Date of Exam: 12/01/2020 Medical Rec #:  [id number]     Height:       68.0 in Accession #:    [id number]    Weight:       161.2 lb Date of Birth:  02/20/1971     BSA:          1.865 m Patient Age:    49 years      BP:           100/76 mmHg Patient Gender: M             HR:           90 bpm. Exam Location:  Outpatient Procedure: 2D Echo, Cardiac Doppler, Color Doppler and Intracardiac            Opacification Agent Indications:    CHF  History:        Patient has prior history of Echocardiogram examinations, most                 recent 07/16/2020. CHF and Cardiomyopathy, CAD and Previous                 Myocardial Infarction, Prior CABG; Arrythmias:Atrial                 Fibrillation. Impella.  Sonographer:    Lavenia AtlasBrooke Strickland Referring Phys: [phone] Roxy HorsemanBRITTAINY M SIMMONS IMPRESSIONS  1. No LV thrombus. Left ventricular ejection fraction, by estimation, is 20 to 25%. The left ventricle has  severely decreased function. The left ventricle demonstrates global hypokinesis.  There is mild left ventricular hypertrophy. Left ventricular diastolic parameters are consistent with Grade I diastolic dysfunction (impaired relaxation).  2. Right ventricular systolic function is mildly reduced. The right ventricular size is normal. There is normal pulmonary artery systolic pressure. The estimated right ventricular systolic pressure is 26.0 mmHg.  3. S/p mitral valve repair with trivial regurgitation and mild stenosis (mean gradient 4 mmHg). The mitral valve has been repaired/replaced.  4. The aortic valve is tricuspid. Aortic valve regurgitation is not visualized. No aortic stenosis is present.  5. The inferior vena cava is normal in size with greater than 50% respiratory variability, suggesting right atrial pressure of 3 mmHg. FINDINGS  Left Ventricle: No LV thrombus. Left ventricular ejection fraction, by estimation, is 20 to 25%. The left ventricle has severely decreased function. The left ventricle demonstrates global hypokinesis. Definity contrast agent was given IV to delineate the left ventricular endocardial borders. The left ventricular internal cavity size was normal in size. There is mild left ventricular hypertrophy. Left ventricular diastolic parameters are consistent with Grade I diastolic dysfunction (impaired relaxation). Right Ventricle: The right ventricular size is normal. No increase in right ventricular wall thickness. Right ventricular systolic function is mildly reduced. There is normal pulmonary artery systolic pressure. The tricuspid regurgitant velocity is 2.40 m/s, and with an assumed right atrial pressure of 3 mmHg, the estimated right ventricular systolic pressure is 26.0 mmHg. Left Atrium: Left atrial size was normal in size. Right Atrium: Right atrial size was normal in size. Pericardium: There is no evidence of pericardial effusion. Mitral Valve: S/p mitral valve repair with trivial regurgitation and mild stenosis (mean gradient 4 mmHg). The mitral valve has been  repaired/replaced. Trivial mitral valve regurgitation. The mean mitral valve gradient is 4.0 mmHg. Tricuspid Valve: The tricuspid valve is normal in structure. Tricuspid valve regurgitation is trivial. Aortic Valve: The aortic valve is tricuspid. Aortic valve regurgitation is not visualized. No aortic stenosis is present. Pulmonic Valve: The pulmonic valve was normal in structure. Pulmonic valve regurgitation is not visualized. Aorta: The aortic root is normal in size and structure. Venous: The inferior vena cava is normal in size with greater than 50% respiratory variability, suggesting right atrial pressure of 3 mmHg. IAS/Shunts: No atrial level shunt detected by color flow Doppler.  LEFT VENTRICLE PLAX 2D LVIDd:         6.10 cm      Diastology LVIDs:         5.30 cm      LV e' medial:    4.46 cm/s LV PW:         1.00 cm      LV E/e' medial:  21.7 LV IVS:        1.00 cm      LV e' lateral:   5.33 cm/s LVOT diam:     2.40 cm      LV E/e' lateral: 18.1 LV SV:         68 LV SV Index:   36 LVOT Area:     4.52 cm  LV Volumes (MOD) LV vol d, MOD A2C: 239.0 ml LV vol d, MOD A4C: 228.0 ml LV vol s, MOD A2C: 155.0 ml LV vol s, MOD A4C: 189.0 ml LV SV MOD A2C:     84.0 ml LV SV MOD A4C:     228.0 ml LV SV MOD BP:      62.1 ml RIGHT VENTRICLE RV Basal  diam:  3.00 cm RV S prime:     7.62 cm/s TAPSE (M-mode): 2.0 cm LEFT ATRIUM           Index       RIGHT ATRIUM           Index LA diam:      4.30 cm 2.31 cm/m  RA Area:     11.90 cm LA Vol (A4C): 55.3 ml 29.66 ml/m RA Volume:   26.40 ml  14.16 ml/m  AORTIC VALVE LVOT Vmax:   82.10 cm/s LVOT Vmean:  54.800 cm/s LVOT VTI:    0.150 m  AORTA Ao Root diam: 3.50 cm MITRAL VALVE                TRICUSPID VALVE MV Area (PHT): 3.17 cm     TR Peak grad:   23.0 mmHg MV Mean grad:  4.0 mmHg     TR Vmax:        240.00 cm/s MV Decel Time: 239 msec MV E velocity: 96.70 cm/s   SHUNTS MV A velocity: 137.00 cm/s  Systemic VTI:  0.15 m MV E/A ratio:  0.71         Systemic Diam: 2.40 cm  Marca Ancona MD Electronically signed by Marca Ancona MD Signature Date/Time: 12/01/2020/11:35:59 AM    Final     EKG: Sinus @ 86 17/11/36 Inferior and anterior MI  Assessment and Plan:  Ischemic cardiomyopathy  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  We have reviewed with him the potential benefits of an ICD for primary prevention.  We reviewed benefits and risks including but not limited to death infection perforation lead dislodgment and inappropriate shocks.  He understands these risks and he and his family would like to proceed.  WE shared in the decision making   He is on Coumadin for an apical thrombus.  We will anticipate half dose for a few days prior to the procedure.  Continue guideline directed therapy.  He is not a candidate for CRT with his narrow QRS        Sherryl Manges

## 2020-12-21 NOTE — H&P (View-Only) (Signed)
ELECTROPHYSIOLOGY CONSULT NOTE  Patient ID: Jerry [name], MRN: [id number], DOB/AGE: 10/28/1970 50 y.o. Admit date: (Not on file) Date of Consult: 12/21/2020  Primary Physician: Sigmund Hazel, MD Primary Cardiologist: DM     HPI Jerry [name] is a 50 y.o. male referred for consideration of an ICD for primary prevention at the request of Dr. Shirlee Latch   Presented 10/21 with acute inferior/anterolateral MI shortly after back surgery.  Found to have severe three-vessel disease mitral regurgitation and an apical thrombus.  Course was complicated by cardiogenic shock and septic pneumonia.  Ultimately underwent CABG x4 (LIMA-LAD, left radial-OM1, sequential SVG-PDA/PLV) and mitral valve repair with Impella support.  Surgery was complicated by local tamponade of the right atrium.  Postoperative atrial fibrillation was treated with amiodarone.  He has persistent LV dysfunction  He is able to walk without dyspnea.  He is able to climb stairs.  No nocturnal dyspnea orthopnea or peripheral edema  He has been aggressively managed with guideline directed therapy  He has need for repeat back surgery  DATE TEST EF   10/21 LHC 25-30 % LAD-T (R>>L); CXp-90% RCAd-T>>POBA  10/21 Echo   * % MR mod  3/*22 Echo  20-25%    Date Cr K Dig Hgb  3/22 1.12 4.8 0.5 16.3             Past Medical History:  Diagnosis Date  . Anxiety   . Coronary artery disease   . Myocardial infarction [facility])       Surgical History:  Past Surgical History:  Procedure Laterality Date  . BACK SURGERY    . CARDIAC CATHETERIZATION    . CORONARY ANGIOPLASTY    . CORONARY ARTERY BYPASS GRAFT N/A 07/07/2020   Procedure: CORONARY ARTERY BYPASS GRAFTING (CABG) x FOUR, USING LEFT RADIAL ARTERY AND RIGHT LEG GREATER SAPHENOUS VEIN HARVESTED ENDOSCOPICALLY;  Surgeon: Loreli Slot, MD;  Location: [facility] OR;  Service: Open Heart Surgery;  Laterality: N/A;  . CORONARY BALLOON ANGIOPLASTY N/A 06/25/2020   Procedure:  CORONARY BALLOON ANGIOPLASTY;  Surgeon: Tonny Bollman, MD;  Location: [facility] INVASIVE CV LAB;  Service: Cardiovascular;  Laterality: N/A;  . CORONARY THROMBECTOMY N/A 06/25/2020   Procedure: Coronary Thrombectomy;  Surgeon: Tonny Bollman, MD;  Location: [facility] INVASIVE CV LAB;  Service: Cardiovascular;  Laterality: N/A;  . CORONARY/GRAFT ACUTE MI REVASCULARIZATION N/A 06/26/2020   Procedure: Coronary/Graft Acute MI Revascularization;  Surgeon: Tonny Bollman, MD;  Location: [facility] INVASIVE CV LAB;  Service: Cardiovascular;  Laterality: N/A;  . ENDOVEIN HARVEST OF GREATER SAPHENOUS VEIN Right 07/07/2020   Procedure: ENDOVEIN HARVEST OF GREATER SAPHENOUS VEIN;  Surgeon: Loreli Slot, MD;  Location: [facility] OR;  Service: Open Heart Surgery;  Laterality: Right;  . EXPLORATION POST OPERATIVE OPEN HEART N/A 07/08/2020   Procedure: EXPLORATION POST OPERATIVE OPEN HEART TAMPONADE;  Surgeon: Loreli Slot, MD;  Location: [facility] OR;  Service: Open Heart Surgery;  Laterality: N/A;  . FRACTURE SURGERY    . LEFT HEART CATH AND CORONARY ANGIOGRAPHY N/A 06/25/2020   Procedure: LEFT HEART CATH AND CORONARY ANGIOGRAPHY;  Surgeon: Tonny Bollman, MD;  Location: [facility] INVASIVE CV LAB;  Service: Cardiovascular;  Laterality: N/A;  . PLACEMENT OF IMPELLA LEFT VENTRICULAR ASSIST DEVICE Right 07/07/2020   Procedure: PLACEMENT OF IMPELLA LEFT VENTRICULAR ASSIST DEVICE USING ABIOMED IMPELLA 5.5;  Surgeon: Loreli Slot, MD;  Location: [facility] OR;  Service: Open Heart Surgery;  Laterality: Right;  . PLACEMENT OF IMPELLA LEFT VENTRICULAR ASSIST DEVICE N/A 07/15/2020  Procedure: REMOVAL OF IMPELLA LEFT VENTRICULAR ASSIST DEVICE;  Surgeon: Loreli Slot, MD;  Location: [facility] OR;  Service: Open Heart Surgery;  Laterality: N/A;  . RADIAL ARTERY HARVEST Left 07/07/2020   Procedure: RADIAL ARTERY HARVEST left;  Surgeon: Loreli Slot, MD;  Location: [facility] OR;  Service: Open Heart Surgery;  Laterality: Left;  . right  clavicle fracture reapir  2010  . RIGHT HEART CATH N/A 06/26/2020   Procedure: RIGHT HEART CATH;  Surgeon: Tonny Bollman, MD;  Location: [facility] INVASIVE CV LAB;  Service: Cardiovascular;  Laterality: N/A;  . TEE WITHOUT CARDIOVERSION N/A 07/07/2020   Procedure: TRANSESOPHAGEAL ECHOCARDIOGRAM (TEE);  Surgeon: Loreli Slot, MD;  Location: [facility] OR;  Service: Open Heart Surgery;  Laterality: N/A;     Home Meds: Current Meds  Medication Sig  . acetaminophen (TYLENOL) 325 MG tablet Take 2 tablets (650 mg total) by mouth every 6 (six) hours as needed for mild pain.  Marland Kitchen ALPRAZolam (XANAX) 0.5 MG tablet Take 0.5 mg by mouth at bedtime as needed for sleep.   Marland Kitchen aspirin EC 81 MG EC tablet Take 1 tablet (81 mg total) by mouth daily. Swallow whole.  Marland Kitchen atorvastatin (LIPITOR) 80 MG tablet Take 1 tablet (80 mg total) by mouth daily.  . carvedilol (COREG) 6.25 MG tablet Take 1 tablet (6.25 mg total) by mouth 2 (two) times daily with a meal.  . digoxin (LANOXIN) 0.125 MG tablet Take 0.5 tablets (0.0625 mg total) by mouth daily.  . empagliflozin (JARDIANCE) 10 MG TABS tablet Take 1 tablet (10 mg total) by mouth daily before breakfast.  . Multiple Vitamins-Minerals (CENTRUM ULTRA MENS) TABS Take 1 tablet by mouth.  . sacubitril-valsartan (ENTRESTO) 49-51 MG Take 1 tablet by mouth 2 (two) times daily.  Marland Kitchen spironolactone (ALDACTONE) 25 MG tablet Take 1 tablet (25 mg total) by mouth at bedtime.  . traZODone (DESYREL) 100 MG tablet Take 100 mg by mouth at bedtime.  Marland Kitchen warfarin (COUMADIN) 2.5 MG tablet Take 1 tablet (2.5 mg total) by mouth daily at 4 PM. Or as directed by the [facility].    Allergies: No Known Allergies  Social History   Socioeconomic History  . Marital status: Single    Spouse name: Not on file  . Number of children: Not on file  . Years of education: Not on file  . Highest education level: Not on file  Occupational History    Employer: [employer]  Tobacco Use  . Smoking  status: Never Smoker  . Smokeless tobacco: Never Used  Vaping Use  . Vaping Use: Never used  Substance and Sexual Activity  . Alcohol use: Yes    Alcohol/week: 1.0 standard drink    Types: 1 Standard drinks or equivalent per week  . Drug use: No  . Sexual activity: Yes    Birth control/protection: None  Other Topics Concern  . Not on file  Social History Narrative  . Not on file   Social Determinants of Health   Financial Resource Strain: Not on file  Food Insecurity: Not on file  Transportation Needs: Not on file  Physical Activity: Not on file  Stress: Not on file  Social Connections: Not on file  Intimate Partner Violence: Not on file     History reviewed. No pertinent family history.   ROS:  Please see the history of present illness.     All other systems reviewed and negative.    Physical Exam:  Blood pressure 112/74, pulse 86, height  5\' 8"  (1.727 m), weight 157 lb 12.8 oz (71.6 kg), SpO2 95 %. General: Well developed, well nourished male in no acute distress. Head: Normocephalic, atraumatic, sclera non-icteric, no xanthomas, nares are without discharge. EENT: normal  Lymph Nodes:  none Neck: Negative for carotid bruits. JVD not elevated. Back:without scoliosis kyphosis  Lungs: Clear bilaterally to auscultation without wheezes, rales, or rhonchi. Breathing is unlabored. Heart: RRR with S1 S2. No  * murmur . No rubs, or gallops appreciated. Abdomen: Soft, non-tender, non-distended with normoactive bowel sounds. No hepatomegaly. No rebound/guarding. No obvious abdominal masses. Msk:  Strength and tone appear normal for age. Extremities: No clubbing or cyanosis. No + edema.  Distal pedal pulses are 2+ and equal bilaterally. Skin: Warm and Dry Neuro: Alert and oriented X 3. CN III-XII intact Grossly normal sensory and motor function . Psych:  Responds to questions appropriately with a normal affect.      Labs: Cardiac Enzymes No results for input(s): CKTOTAL,  CKMB, TROPONINI in the last 72 hours. CBC Lab Results  Component Value Date   WBC 12.8 (H) 12/01/2020   HGB 16.3 12/01/2020   HCT 50.0 12/01/2020   MCV 86.2 12/01/2020   PLT 191 12/01/2020   PROTIME: No results for input(s): LABPROT, INR in the last 72 hours. Chemistry No results for input(s): NA, K, CL, CO2, BUN, CREATININE, CALCIUM, PROT, BILITOT, ALKPHOS, ALT, AST, GLUCOSE in the last 168 hours.  Invalid input(s): LABALBU Lipids Lab Results  Component Value Date   CHOL 121 08/02/2020   HDL 34 (L) 08/02/2020   LDLCALC 59 08/02/2020   TRIG 139 08/02/2020   BNP No results found for: PROBNP Thyroid Function Tests: No results for input(s): TSH, T4TOTAL, T3FREE, THYROIDAB in the last 72 hours.  Invalid input(s): FREET3 Miscellaneous Lab Results  Component Value Date   DDIMER 1.05 (H) 07/08/2020    Radiology/Studies:  ECHOCARDIOGRAM COMPLETE  Result Date: 12/01/2020    ECHOCARDIOGRAM REPORT   Patient Name:   Jerry [name] Date of Exam: 12/01/2020 Medical Rec #:  [id number]     Height:       68.0 in Accession #:    [id number]    Weight:       161.2 lb Date of Birth:  [birth year]     BSA:          1.865 m Patient Age:    49 years      BP:           100/76 mmHg Patient Gender: M             HR:           90 bpm. Exam Location:  Outpatient Procedure: 2D Echo, Cardiac Doppler, Color Doppler and Intracardiac            Opacification Agent Indications:    CHF  History:        Patient has prior history of Echocardiogram examinations, most                 recent 07/16/2020. CHF and Cardiomyopathy, CAD and Previous                 Myocardial Infarction, Prior CABG; Arrythmias:Atrial                 Fibrillation. Impella.  Sonographer:    Lavenia AtlasBrooke Strickland Referring Phys: [phone] Roxy HorsemanBRITTAINY M SIMMONS IMPRESSIONS  1. No LV thrombus. Left ventricular ejection fraction, by estimation, is 20 to 25%. The left ventricle has  severely decreased function. The left ventricle demonstrates global hypokinesis.  There is mild left ventricular hypertrophy. Left ventricular diastolic parameters are consistent with Grade I diastolic dysfunction (impaired relaxation).  2. Right ventricular systolic function is mildly reduced. The right ventricular size is normal. There is normal pulmonary artery systolic pressure. The estimated right ventricular systolic pressure is 26.0 mmHg.  3. S/p mitral valve repair with trivial regurgitation and mild stenosis (mean gradient 4 mmHg). The mitral valve has been repaired/replaced.  4. The aortic valve is tricuspid. Aortic valve regurgitation is not visualized. No aortic stenosis is present.  5. The inferior vena cava is normal in size with greater than 50% respiratory variability, suggesting right atrial pressure of 3 mmHg. FINDINGS  Left Ventricle: No LV thrombus. Left ventricular ejection fraction, by estimation, is 20 to 25%. The left ventricle has severely decreased function. The left ventricle demonstrates global hypokinesis. Definity contrast agent was given IV to delineate the left ventricular endocardial borders. The left ventricular internal cavity size was normal in size. There is mild left ventricular hypertrophy. Left ventricular diastolic parameters are consistent with Grade I diastolic dysfunction (impaired relaxation). Right Ventricle: The right ventricular size is normal. No increase in right ventricular wall thickness. Right ventricular systolic function is mildly reduced. There is normal pulmonary artery systolic pressure. The tricuspid regurgitant velocity is 2.40 m/s, and with an assumed right atrial pressure of 3 mmHg, the estimated right ventricular systolic pressure is 26.0 mmHg. Left Atrium: Left atrial size was normal in size. Right Atrium: Right atrial size was normal in size. Pericardium: There is no evidence of pericardial effusion. Mitral Valve: S/p mitral valve repair with trivial regurgitation and mild stenosis (mean gradient 4 mmHg). The mitral valve has been  repaired/replaced. Trivial mitral valve regurgitation. The mean mitral valve gradient is 4.0 mmHg. Tricuspid Valve: The tricuspid valve is normal in structure. Tricuspid valve regurgitation is trivial. Aortic Valve: The aortic valve is tricuspid. Aortic valve regurgitation is not visualized. No aortic stenosis is present. Pulmonic Valve: The pulmonic valve was normal in structure. Pulmonic valve regurgitation is not visualized. Aorta: The aortic root is normal in size and structure. Venous: The inferior vena cava is normal in size with greater than 50% respiratory variability, suggesting right atrial pressure of 3 mmHg. IAS/Shunts: No atrial level shunt detected by color flow Doppler.  LEFT VENTRICLE PLAX 2D LVIDd:         6.10 cm      Diastology LVIDs:         5.30 cm      LV e' medial:    4.46 cm/s LV PW:         1.00 cm      LV E/e' medial:  21.7 LV IVS:        1.00 cm      LV e' lateral:   5.33 cm/s LVOT diam:     2.40 cm      LV E/e' lateral: 18.1 LV SV:         68 LV SV Index:   36 LVOT Area:     4.52 cm  LV Volumes (MOD) LV vol d, MOD A2C: 239.0 ml LV vol d, MOD A4C: 228.0 ml LV vol s, MOD A2C: 155.0 ml LV vol s, MOD A4C: 189.0 ml LV SV MOD A2C:     84.0 ml LV SV MOD A4C:     228.0 ml LV SV MOD BP:      62.1 ml RIGHT VENTRICLE RV Basal  diam:  3.00 cm RV S prime:     7.62 cm/s TAPSE (M-mode): 2.0 cm LEFT ATRIUM           Index       RIGHT ATRIUM           Index LA diam:      4.30 cm 2.31 cm/m  RA Area:     11.90 cm LA Vol (A4C): 55.3 ml 29.66 ml/m RA Volume:   26.40 ml  14.16 ml/m  AORTIC VALVE LVOT Vmax:   82.10 cm/s LVOT Vmean:  54.800 cm/s LVOT VTI:    0.150 m  AORTA Ao Root diam: 3.50 cm MITRAL VALVE                TRICUSPID VALVE MV Area (PHT): 3.17 cm     TR Peak grad:   23.0 mmHg MV Mean grad:  4.0 mmHg     TR Vmax:        240.00 cm/s MV Decel Time: 239 msec MV E velocity: 96.70 cm/s   SHUNTS MV A velocity: 137.00 cm/s  Systemic VTI:  0.15 m MV E/A ratio:  0.71         Systemic Diam: 2.40 cm  Marca Ancona MD Electronically signed by Marca Ancona MD Signature Date/Time: 12/01/2020/11:35:59 AM    Final     EKG: Sinus @ 86 17/11/36 Inferior and anterior MI  Assessment and Plan:  Ischemic cardiomyopathy  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  We have reviewed with him the potential benefits of an ICD for primary prevention.  We reviewed benefits and risks including but not limited to death infection perforation lead dislodgment and inappropriate shocks.  He understands these risks and he and his family would like to proceed.  He is on Coumadin for an apical thrombus.  We will anticipate half dose for a few days prior to the procedure.  Continue guideline directed therapy.  He is not a candidate for CRT with his narrow QRS        Sherryl Manges

## 2020-12-21 NOTE — Patient Instructions (Signed)
Medication Instructions:  Your physician recommends that you continue on your current medications as directed. Please refer to the Current Medication list given to you today.  *If you need a refill on your cardiac medications before your next appointment, please call your pharmacy*   Lab Work: None ordered.  If you have labs (blood work) drawn today and your tests are completely normal, you will receive your results only by: Marland Kitchen MyChart Message (if you have MyChart) OR . A paper copy in the mail If you have any lab test that is abnormal or we need to change your treatment, we will call you to review the results.   Testing/Procedures: Your physician has recommended that you have a defibrillator inserted. An implantable cardioverter defibrillator (ICD) is a small device that is placed in your chest or, in rare cases, your abdomen. This device uses electrical pulses or shocks to help control life-threatening, irregular heartbeats that could lead the heart to suddenly stop beating (sudden cardiac arrest). Leads are attached to the ICD that goes into your heart. This is done in the hospital and usually requires an overnight stay. Please see the instruction sheet given to you today for more information.    Follow-Up: At [facility], you and your health needs are our priority.  As part of our continuing mission to provide you with exceptional heart care, we have created designated Provider Care Teams.  These Care Teams include your primary Cardiologist (physician) and Advanced Practice Providers (APPs -  Physician Assistants and Nurse Practitioners) who all work together to provide you with the care you need, when you need it.  We recommend signing up for the patient portal called "MyChart".  Sign up information is provided on this After Visit Summary.  MyChart is used to connect with patients for Virtual Visits (Telemedicine).  Patients are able to view lab/test results, encounter notes, upcoming  appointments, etc.  Non-urgent messages can be sent to your provider as well.   To learn more about what you can do with MyChart, go to [email or web address].    Your next appointment:   To be scheduled   Other Instructions  Cardioverter Defibrillator Implantation An implantable cardioverter defibrillator (ICD) is a device that identifies and corrects abnormal heart rhythms. Cardioverter defibrillator implantation is a surgery to place an ICD under the skin in the chest or abdomen. An ICD has a battery, a small computer (pulse generator), and wires (leads) that go into the heart. The ICD detects and corrects two types of dangerous irregular heart rhythms (arrhythmias):  A rapid heart rhythm in the lower chambers of the heart (ventricles). This is called ventricular tachycardia.  The ventricles contracting in an uncoordinated way. This is called ventricular fibrillation. There are different types of ICDs, and the electrical signals from the ICD can be programmed differently based on the condition being treated. The electrical signals from the ICD can be low-energy pulses, high-energy shocks, or a combination of the two. The low-energy pulses are generally used to restore the heartbeat to normal when it is either too slow (bradycardia) or too fast. These pulses are painless. The high-energy shocks are used to treat abnormal rhythms such as ventricular tachycardia or ventricular fibrillation. This shock may feel like a strong jolt in the chest. Your health care provider may recommend an ICD if you have:  Had a ventricular arrhythmia in the past.  A damaged heart because of a disease or heart condition.  A weakened heart muscle from a heart attack or  cardiac arrest.  A congenital heart defect.  Long QT syndrome, which is a disorder of the heart's electrical system.  Brugada syndrome, which is a condition that causes a disruption of the heart's normal rhythm. Tell a health care provider  about:  Any allergies you have.  All medicines you are taking, including vitamins, herbs, eye drops, creams, and over-the-counter medicines.  Any problems you or family members have had with anesthetic medicines.  Any blood disorders you have.  Any surgeries you have had.  Any medical conditions you have.  Whether you are pregnant or may be pregnant. What are the risks? Generally, this is a safe procedure. However, problems may occur, including:  Infection.  Bleeding.  Allergic reactions to medicines used during the procedure.  Blood clots.  Swelling or bruising.  Damage to nearby structures or organs, such as nerves, lungs, blood vessels, or the heart where the ICD leads or pulse generator is implanted. What happens before the procedure? Staying hydrated Follow instructions from your health care provider about hydration, which may include:  Up to 2 hours before the procedure - you may continue to drink clear liquids, such as water, clear fruit juice, black coffee, and plain tea.   Eating and drinking restrictions Follow instructions from your health care provider about eating and drinking, which may include:  8 hours before the procedure - stop eating heavy meals or foods, such as meat, fried foods, or fatty foods.  6 hours before the procedure - stop eating light meals or foods, such as toast or cereal.  6 hours before the procedure - stop drinking milk or drinks that contain milk.  2 hours before the procedure - stop drinking clear liquids. Medicines Ask your health care provider about:  Changing or stopping your regular medicines. This is especially important if you are taking diabetes medicines or blood thinners.  Taking medicines such as aspirin and ibuprofen. These medicines can thin your blood. Do not take these medicines unless your health care provider tells you to take them.  Taking over-the-counter medicines, vitamins, herbs, and supplements. Tests You  may have an exam or testing. These may include:  Blood tests.  A test to check the electrical signals in your heart (electrocardiogram, ECG).  Imaging tests, such as a chest X-ray.  Echocardiogram. This is an ultrasound of your heart to evaluate your heart structures and function.  An event monitor or Holter monitor to wear at home. General instructions  Do not use any products that contain nicotine or tobacco for at least 4 weeks before the procedure. These products include cigarettes, chewing tobacco, and vaping devices, such as e-cigarettes. If you need help quitting, ask your health care provider.  Ask your health care provider: ? How your procedure site will be marked. ? What steps will be taken to help prevent infection. These may include:  Removing hair at the surgery site.  Washing skin with a germ-killing soap.  Taking antibiotic medicine.  You may be asked to shower with a germ-killing soap.  Plan to have a responsible adult take you home from the hospital or clinic. What happens during the procedure?  Small monitors will be put on your body. They will be used to check your heart rate, blood pressure, and oxygen level.  A pair of sticky pads (defibrillator pads) may be placed on your back and chest. These pads are able to pace your heart as needed during the procedure.  An IV will be inserted into one of  your veins.  You will be given one or more of the following: ? A medicine to help you relax (sedative). ? A medicine to numb the area (local anesthetic). ? A medicine to make you fall asleep(general anesthetic).  A small incision will be made to create a deep pocket under the skin of your chest or abdomen.  Leads will be guided through a blood vessel into your heart and attached to your heart muscles. Depending on the ICD, the leads may go into one ventricle, or they may go into both ventricles and into an upper chamber of the heart. An X-ray machine (fluoroscope)  will be used to help guide the leads. The other end of the leads will be attached to the pulse generator.  The pulse generator will be placed into the pocket under the skin.  The ICD will be tested, and your health care provider will program the ICD for the condition being treated.  The incision will be closed with stitches (sutures), skin glue, adhesive strips, or staples.  A bandage (dressing) will be placed over the incision. The procedure may vary among health care providers and hospitals.   What happens after the procedure?  Your blood pressure, heart rate, breathing rate, and blood oxygen level will be monitored until you leave the hospital or clinic. Your health care provider will also monitor your ICD to make sure it is working properly.  A chest X-ray will be taken to check that the ICD is in the right place.  Do not raise the arm on the side of your procedure higher than your shoulder for as long as told by your health care provider. This is usually at least 6 weeks.  You may be given an identification card explaining that you have an ICD.  You will be given a remote home monitoring device to use with your ICD to allow your device to communicate with your clinic. Summary  An implantable cardioverter defibrillator (ICD) is a device that identifies and corrects abnormal heart rhythms. Cardioverter defibrillator implantation is a surgery to place an ICD under the skin in the chest or abdomen.  An ICD consists of a battery, a small computer (pulse generator), and wires (leads) that go into the heart.  During the procedure, the ICD will be tested, and your health care provider will program the ICD for the condition being treated.  After the procedure, a chest X-ray will be taken to check that the ICD is in the right place. This information is not intended to replace advice given to you by your health care provider. Make sure you discuss any questions you have with your health care  provider. Document Revised: 03/10/2020 Document Reviewed: 03/10/2020 Elsevier Patient Education  2021 [employer].

## 2020-12-22 NOTE — Addendum Note (Signed)
Addended by: Alois Cliche on: 12/22/2020 01:18 PM   Modules accepted: Orders

## 2020-12-27 ENCOUNTER — Telehealth: Payer: Self-pay | Admitting: *Deleted

## 2020-12-27 ENCOUNTER — Encounter: Payer: Self-pay | Admitting: *Deleted

## 2020-12-27 NOTE — Telephone Encounter (Signed)
Patient on warfarin for LV thrombus (noted on admission on 06/25/20) and post OP A fib. At last OV on 12/01/20, it was noted his LV thrombus was not present on echo and patient was in normal sinus rhythm.   Procedure: LUMBAR SPINE SURGERY; POSSIBLE FUSION vs MICRODISCECTOMY  Date of procedure: TBD   CHA2DS2-VASc Score = 3  This indicates a 3.2% annual risk of stroke. The patient's score is based upon: CHF History: Yes HTN History: Yes Diabetes History: No Stroke History: No Vascular Disease History: Yes Age Score: 0 Gender Score: 0    CrCl 80 mL/min Platelet count 191K  Due to spinal procedure, patient can hold warfarin for 5 days before procedure.  Per Dr. Shirlee Latch, patient WILL need Lovenox bridge before procedure.  Will coordinate with Coumadin clinic.

## 2020-12-27 NOTE — Telephone Encounter (Signed)
EF is still low.  Still risk of LV thrombus if he stops warfarin.  Should probably be bridged off warfarin.  Would not use DOAC with LV thrombus as indication.

## 2020-12-27 NOTE — Telephone Encounter (Signed)
   [facility] Pre-operative Risk Assessment    Patient Name: Brodan Grewell  DOB: [birth year]  MRN: [id number]   HEARTCARE STAFF: - Please ensure there is not already an duplicate clearance open for this procedure. - Under Visit Info/Reason for Call, type in Other and utilize the format Clearance MM/DD/YY or Clearance TBD. Do not use dashes or single digits. - If request is for dental extraction, please clarify the # of teeth to be extracted.  Request for surgical clearance:  1. What type of surgery is being performed? LUMBAR SPINE SURGERY; POSSIBLE FUSION vs MICRODISCECTOMY    2. When is this surgery scheduled? TBD   3. What type of clearance is required (medical clearance vs. Pharmacy clearance to hold med vs. Both)? BOTH  4. Are there any medications that need to be held prior to surgery and how long? COUMADIN   5. Practice name and name of physician performing surgery? [place]; DR. Marcello Moores OSTERGARD   6. What is the office phone number? [phone]   7.   What is the office fax number? [place] : JESSICA  8.   Anesthesia type (None, local, MAC, general) ? GENERAL   Julaine Hua 12/27/2020, 12:00 PM  _________________________________________________________________   (provider comments below)

## 2020-12-27 NOTE — Telephone Encounter (Signed)
Patient on warfarin for LV thrombus (noted on admission on 06/25/20) and post OP A fib. At last OV on 12/01/20, it was noted his LV thrombus was not present on echo and patient was in normal sinus rhythm.   Procedure:  LUMBAR SPINE SURGERY; POSSIBLE FUSION vs MICRODISCECTOMY      Date of procedure: TBD   CHA2DS2-VASc Score = 3  This indicates a 3.2% annual risk of stroke. The patient's score is based upon: CHF History: Yes HTN History: Yes Diabetes History: No Stroke History: No Vascular Disease History: Yes Age Score: 0 Gender Score: 0    CrCl 80 mL/min Platelet count 191K  Due to spinal procedure, patient can hold warfarin for 5 days before procedure.  Dr Shirlee Latch, if patient's A Fib was noted on post OP but patient was in NSR at last visit and since LV thrombus was not seen on recent echo, does patient need to be anticoagulated?  And if so, consider DOAC?

## 2020-12-27 NOTE — Telephone Encounter (Signed)
Pharmacy, can you please comment on how long Coumadin can be held for upcoming procedure?  Thank you! 

## 2020-12-28 NOTE — Telephone Encounter (Signed)
Hi Dr. Shirlee Latch and Dr. Graciela Husbands,  Mr. Vanwingerden has upcoming spinal surgery planned. However, I see that he is scheduled to have ICD implanted on 01/05/2021. Do either of you recommend he wait a specific length of time following ICD implant prior to having spinal surgery?  Please route response back to P [facility] PREOP.  Thank you! 

## 2020-12-28 NOTE — Telephone Encounter (Signed)
Spoke with pt and reviewed Device Instruction letter with pt.  See Letter for complete details.  Pt verbalizes understanding and agrees with current plan.

## 2020-12-29 ENCOUNTER — Telehealth (HOSPITAL_COMMUNITY): Payer: Self-pay | Admitting: *Deleted

## 2020-12-29 NOTE — Telephone Encounter (Signed)
Received fax from [facility], pt needs clearance for lumbar fusion with Dr Maurice Small  Per Dr Shirlee Latch: "moderate risk, hould warfarin prior to surgery w/Lovenox bridge given history of LV thrombus"  Per chart [facility] is arranging Lovenox bridge  Note faxed back to them at [phone] atten Shanda Bumps

## 2020-12-30 ENCOUNTER — Other Ambulatory Visit: Payer: 59

## 2020-12-30 ENCOUNTER — Other Ambulatory Visit: Payer: Self-pay

## 2020-12-30 ENCOUNTER — Other Ambulatory Visit: Payer: Self-pay | Admitting: Neurological Surgery

## 2020-12-30 ENCOUNTER — Ambulatory Visit (INDEPENDENT_AMBULATORY_CARE_PROVIDER_SITE_OTHER): Payer: 59 | Admitting: Internal Medicine

## 2020-12-30 ENCOUNTER — Encounter: Payer: Self-pay | Admitting: Cardiology

## 2020-12-30 DIAGNOSIS — I4891 Unspecified atrial fibrillation: Secondary | ICD-10-CM

## 2020-12-30 DIAGNOSIS — Z79899 Other long term (current) drug therapy: Secondary | ICD-10-CM

## 2020-12-30 DIAGNOSIS — I5022 Chronic systolic (congestive) heart failure: Secondary | ICD-10-CM

## 2020-12-30 DIAGNOSIS — Z5181 Encounter for therapeutic drug level monitoring: Secondary | ICD-10-CM

## 2020-12-30 DIAGNOSIS — Z01812 Encounter for preprocedural laboratory examination: Secondary | ICD-10-CM

## 2020-12-30 LAB — CBC
Hematocrit: 44.7 % (ref 37.5–51.0)
Hemoglobin: 14.9 g/dL (ref 13.0–17.7)
MCH: 28.9 pg (ref 26.6–33.0)
MCHC: 33.3 g/dL (ref 31.5–35.7)
MCV: 87 fL (ref 79–97)
Platelets: 193 10*3/uL (ref 150–450)
RBC: 5.16 x10E6/uL (ref 4.14–5.80)
RDW: 19.8 % — ABNORMAL HIGH (ref 11.6–15.4)
WBC: 10.1 10*3/uL (ref 3.4–10.8)

## 2020-12-30 LAB — BASIC METABOLIC PANEL
BUN/Creatinine Ratio: 21 — ABNORMAL HIGH (ref 9–20)
BUN: 21 mg/dL (ref 6–24)
CO2: 26 mmol/L (ref 20–29)
Calcium: 9.4 mg/dL (ref 8.7–10.2)
Chloride: 103 mmol/L (ref 96–106)
Creatinine, Ser: 1.02 mg/dL (ref 0.76–1.27)
Glucose: 106 mg/dL — ABNORMAL HIGH (ref 65–99)
Potassium: 5 mmol/L (ref 3.5–5.2)
Sodium: 139 mmol/L (ref 134–144)
eGFR: 90 mL/min/{1.73_m2} (ref >59)

## 2020-12-30 LAB — PROTIME-INR
INR: 1.7 — ABNORMAL HIGH (ref 0.9–1.2)
Prothrombin Time: 16.9 s — ABNORMAL HIGH (ref 9.1–12.0)

## 2020-12-30 NOTE — Telephone Encounter (Signed)
Spoke with Dr Graciela Husbands and Dr Shirlee Latch.  Decision was made to hold warfarin for 5 days beginning 4/9 with no Lovenox bridge needed.

## 2020-12-30 NOTE — Progress Notes (Signed)
This encounter was created in error - please disregard.

## 2020-12-30 NOTE — Telephone Encounter (Signed)
Mellody Dance RN called patient and explained schedule of when to begin holding warfarin and scheduled his next INR

## 2020-12-30 NOTE — Patient Instructions (Signed)
Description   Continue taking Warfarin 1 tablet everyday except 1/2 tablet on Sundays, Tuesdays and Thursdays. Recheck INR in 6 weeks.  Coumadin Clinic [phone]

## 2020-12-30 NOTE — Patient Instructions (Addendum)
Description   Called and spoke to pt and gave the following dosing instructions:  4/7 and 4/8: Take 1 tablet of warfarin  4/9 - 4/13: Hold warfarin  4/14: day of spinal procedure resume normal dose of warfarin unless Dr says otherwise.  Normal dose of warfarin 1 tablet daily, except for 1/2 a tablet on Sunday, Tuesday and Thursday.  Recheck INR 1 week post procedure: [phone].  Pt verbalized understanding of instructions over the phone.

## 2021-01-03 ENCOUNTER — Other Ambulatory Visit (HOSPITAL_COMMUNITY)
Admission: RE | Admit: 2021-01-03 | Discharge: 2021-01-03 | Disposition: A | Discharge status: Home / Self Care | Payer: 59 | Source: Ambulatory Visit | Attending: Internal Medicine | Admitting: Internal Medicine

## 2021-01-03 DIAGNOSIS — Z20822 Contact with and (suspected) exposure to covid-19: Secondary | ICD-10-CM | POA: Insufficient documentation

## 2021-01-03 DIAGNOSIS — Z01812 Encounter for preprocedural laboratory examination: Secondary | ICD-10-CM | POA: Insufficient documentation

## 2021-01-04 ENCOUNTER — Telehealth: Payer: Self-pay

## 2021-01-04 LAB — SARS CORONAVIRUS 2 (TAT 6-24 HRS): SARS Coronavirus 2: NEGATIVE

## 2021-01-04 NOTE — Pre-Procedure Instructions (Signed)
Instructed patient on the following items: Arrival time 1030 Nothing to eat or drink after midnight No meds AM of procedure Responsible person to drive you home and stay with you for 24 hrs Wash with special soap night before and morning of procedure If on anti-coagulant drug instructions Coumadin- last dose 4/9

## 2021-01-04 NOTE — Telephone Encounter (Signed)
Dr. Graciela Husbands, I see this patient is undergoing ICD implantation tomorrow, 4/13 and is scheduled for lumbar spine procedure with possible fusion vs microdiscectomy on 4/14. Also, per pharmacy note plan to hold warfarin 5 days prior to back procedure.   I want to confirm back to back scheduling is okay as well as warfarin instructions.   Please route answer to P CV DIV PREOP.  Thanks!

## 2021-01-04 NOTE — Telephone Encounter (Signed)
Spoke with pt and advised of cancellation tomorrow which will move pt's surgery to arrive at 830am.  Pt verbalizes understanding and thanked Charity fundraiser for the call.

## 2021-01-05 ENCOUNTER — Other Ambulatory Visit: Payer: Self-pay

## 2021-01-05 ENCOUNTER — Ambulatory Visit (HOSPITAL_COMMUNITY): Payer: 59

## 2021-01-05 ENCOUNTER — Ambulatory Visit (HOSPITAL_COMMUNITY)
Admission: RE | Disposition: A | Discharge status: Home / Self Care | Payer: 59 | Source: Home / Self Care | Attending: Internal Medicine

## 2021-01-05 ENCOUNTER — Ambulatory Visit (HOSPITAL_COMMUNITY)
Admission: RE | Admit: 2021-01-05 | Discharge: 2021-01-05 | Disposition: A | Discharge status: Home / Self Care | Payer: 59 | Source: Home / Self Care | Attending: Internal Medicine | Admitting: Internal Medicine

## 2021-01-05 DIAGNOSIS — I255 Ischemic cardiomyopathy: Secondary | ICD-10-CM | POA: Insufficient documentation

## 2021-01-05 DIAGNOSIS — I252 Old myocardial infarction: Secondary | ICD-10-CM | POA: Insufficient documentation

## 2021-01-05 DIAGNOSIS — I429 Cardiomyopathy, unspecified: Secondary | ICD-10-CM | POA: Diagnosis not present

## 2021-01-05 DIAGNOSIS — I2510 Atherosclerotic heart disease of native coronary artery without angina pectoris: Secondary | ICD-10-CM | POA: Insufficient documentation

## 2021-01-05 DIAGNOSIS — Z951 Presence of aortocoronary bypass graft: Secondary | ICD-10-CM | POA: Insufficient documentation

## 2021-01-05 DIAGNOSIS — I513 Intracardiac thrombosis, not elsewhere classified: Secondary | ICD-10-CM | POA: Insufficient documentation

## 2021-01-05 DIAGNOSIS — Z79899 Other long term (current) drug therapy: Secondary | ICD-10-CM | POA: Insufficient documentation

## 2021-01-05 DIAGNOSIS — I340 Nonrheumatic mitral (valve) insufficiency: Secondary | ICD-10-CM | POA: Insufficient documentation

## 2021-01-05 DIAGNOSIS — Z7982 Long term (current) use of aspirin: Secondary | ICD-10-CM | POA: Insufficient documentation

## 2021-01-05 DIAGNOSIS — I4891 Unspecified atrial fibrillation: Secondary | ICD-10-CM | POA: Insufficient documentation

## 2021-01-05 DIAGNOSIS — I5022 Chronic systolic (congestive) heart failure: Secondary | ICD-10-CM

## 2021-01-05 DIAGNOSIS — Z959 Presence of cardiac and vascular implant and graft, unspecified: Secondary | ICD-10-CM

## 2021-01-05 DIAGNOSIS — Z7901 Long term (current) use of anticoagulants: Secondary | ICD-10-CM | POA: Insufficient documentation

## 2021-01-05 DIAGNOSIS — Z95810 Presence of automatic (implantable) cardiac defibrillator: Secondary | ICD-10-CM

## 2021-01-05 HISTORY — PX: ICD IMPLANT: EP1208

## 2021-01-05 HISTORY — DX: Presence of automatic (implantable) cardiac defibrillator: Z95.810

## 2021-01-05 LAB — PROTIME-INR
INR: 1.2 (ref 0.8–1.2)
Prothrombin Time: 15.4 seconds — ABNORMAL HIGH (ref 11.4–15.2)

## 2021-01-05 SURGERY — ICD IMPLANT

## 2021-01-05 MED ORDER — CEFAZOLIN SODIUM-DEXTROSE 2-4 GM/100ML-% IV SOLN
2.0000 g | INTRAVENOUS | Status: AC
Start: 2021-01-05 — End: 2021-01-05
  Administered 2021-01-05: 2 g via INTRAVENOUS

## 2021-01-05 MED ORDER — ONDANSETRON HCL 4 MG/2ML IJ SOLN: 4.0000 mg | Freq: Four times a day (QID) | INTRAMUSCULAR | Status: DC | PRN

## 2021-01-05 MED ORDER — FENTANYL CITRATE (PF) 100 MCG/2ML IJ SOLN
INTRAMUSCULAR | Status: DC | PRN
  Administered 2021-01-05 (×2): 50 ug via INTRAVENOUS

## 2021-01-05 MED ORDER — ACETAMINOPHEN 325 MG PO TABS
325.0000 mg | ORAL_TABLET | ORAL | Status: DC | PRN
  Administered 2021-01-05: 650 mg via ORAL
  Filled 2021-01-05 (×3): qty 2

## 2021-01-05 MED ORDER — SODIUM CHLORIDE 0.9 % IV SOLN: INTRAVENOUS | Status: DC

## 2021-01-05 MED ORDER — MIDAZOLAM HCL 5 MG/5ML IJ SOLN
INTRAMUSCULAR | Status: DC | PRN
  Administered 2021-01-05 (×2): 2 mg via INTRAVENOUS

## 2021-01-05 MED ORDER — LIDOCAINE HCL (PF) 1 % IJ SOLN
INTRAMUSCULAR | Status: AC
  Filled 2021-01-05: qty 60

## 2021-01-05 MED ORDER — CEFAZOLIN SODIUM-DEXTROSE 2-4 GM/100ML-% IV SOLN
INTRAVENOUS | Status: AC
  Filled 2021-01-05: qty 100

## 2021-01-05 MED ORDER — LIDOCAINE HCL (PF) 1 % IJ SOLN
INTRAMUSCULAR | Status: DC | PRN
  Administered 2021-01-05: 60 mL

## 2021-01-05 MED ORDER — SODIUM CHLORIDE 0.9 % IV SOLN
80.0000 mg | INTRAVENOUS | Status: AC
  Administered 2021-01-05: 80 mg

## 2021-01-05 MED ORDER — MIDAZOLAM HCL 5 MG/5ML IJ SOLN
INTRAMUSCULAR | Status: AC
  Filled 2021-01-05: qty 5

## 2021-01-05 MED ORDER — HEPARIN (PORCINE) IN NACL 1000-0.9 UT/500ML-% IV SOLN
INTRAVENOUS | Status: DC | PRN
  Administered 2021-01-05: 500 mL

## 2021-01-05 MED ORDER — SODIUM CHLORIDE 0.9 % IV SOLN
INTRAVENOUS | Status: AC
  Filled 2021-01-05: qty 2

## 2021-01-05 MED ORDER — HEPARIN (PORCINE) IN NACL 1000-0.9 UT/500ML-% IV SOLN
INTRAVENOUS | Status: AC
  Filled 2021-01-05: qty 500

## 2021-01-05 MED ORDER — FENTANYL CITRATE (PF) 100 MCG/2ML IJ SOLN
INTRAMUSCULAR | Status: AC
  Filled 2021-01-05: qty 2

## 2021-01-05 SURGICAL SUPPLY — 7 items
CABLE SURGICAL S-101-97-12 (CABLE) ×2 IMPLANT
HEMOSTAT SURGICEL 2X4 FIBR (HEMOSTASIS) ×1 IMPLANT
ICD MOMENTUM D120 (ICD Generator) ×1 IMPLANT
LEAD RELIANCE 0138-64 (Lead) ×1 IMPLANT
PAD PRO RADIOLUCENT 2001M-C (PAD) ×2 IMPLANT
SHEATH 9.5FR PRELUDE SNAP 13 (SHEATH) ×1 IMPLANT
TRAY PACEMAKER INSERTION (PACKS) ×2 IMPLANT

## 2021-01-05 NOTE — Progress Notes (Signed)
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 Presented with acute inferior/anterolateral MI shortly after back surgery.  Found to have severe three-vessel disease mitral regurgitation and an apical thrombus.  Course was complicated by cardiogenic shock and septic pneumonia.  Ultimately underwent CABG x4 (LIMA-LAD, left radial-OM1, sequential SVG-PDA/PLV) and mitral valve repair with Impella support.  Surgery was complicated by local tamponade of the right atrium.  Postoperative atrial fibrillation was treated with amiodarone.  He has persistent LV dysfunction despite aggressive management with guideline directed therapy.  He has been followed by Dr. Shirlee Latch for this and was recently referred to Dr. Graciela Husbands for consideration of implantation of ICD for primary prevention.  Per Dr. Odessa Fleming note, it was also discussed that the patient was in need of further back surgery.  Patient underwent implantation of [employer] ICD on 01/05/2021. Dr. Graciela Husbands did mention that the patient would be having neurosurgical procedure the following day in his interval H&P.  Per note, "On his Coumadin hold, he is scheduled for neurosurgery tomorrow."    BMP and CBC from 12/30/2020 reviewed, unremarkable.  Will need day of surgery evaluation.  TTE 12/01/2020: 1. No LV thrombus. Left ventricular ejection fraction, by estimation, is  20 to 25%. The left ventricle has severely decreased function. The left  ventricle demonstrates global hypokinesis. There is mild left ventricular  hypertrophy. Left ventricular  diastolic parameters are consistent with Grade I diastolic dysfunction  (impaired relaxation).  2. Right ventricular systolic function is mildly reduced. The right  ventricular size is normal. There is normal pulmonary artery systolic  pressure. The estimated right ventricular systolic pressure is 26.0 mmHg.  3. S/p mitral valve repair with trivial  regurgitation and mild stenosis  (mean gradient 4 mmHg). The mitral valve has been repaired/replaced.  4. The aortic valve is tricuspid. Aortic valve regurgitation is not  visualized. No aortic stenosis is present.  5. The inferior vena cava is normal in size with greater than 50%  respiratory variability, suggesting right atrial pressure of 3 mmHg.    Zannie Cove [facility] Stay [facility]/Anesthesiology Phone [phone] 01/05/2021 4:01 PM

## 2021-01-05 NOTE — Progress Notes (Signed)
Patient is very active while in bed, found pt getting dressing while in bed, I have taught pt to keep left arm in sling and close to his side, pt verbalized understanding. Pt been informed x2.

## 2021-01-05 NOTE — Anesthesia Preprocedure Evaluation (Addendum)
Anesthesia Evaluation  Patient identified by MRN, date of birth, ID band Patient awake    Reviewed: Allergy & Precautions, NPO status , Patient's Chart, lab work & pertinent test results  Airway Mallampati: II  TM Distance: >3 FB Neck ROM: Full    Dental no notable dental hx.    Pulmonary neg pulmonary ROS,    Pulmonary exam normal breath sounds clear to auscultation       Cardiovascular hypertension, + CAD, + CABG and +CHF  + Cardiac Defibrillator  Rhythm:Regular Rate:Normal + Systolic murmurs . No LV thrombus. Left ventricular ejection fraction, by estimation, is  20 to 25%. The left ventricle has severely decreased function. The left  ventricle demonstrates global hypokinesis. There is mild left ventricular  hypertrophy. Left ventricular  diastolic parameters are consistent with Grade I diastolic dysfunction  (impaired relaxation).  2. Right ventricular systolic function is mildly reduced. The right  ventricular size is normal. There is normal pulmonary artery systolic  pressure. The estimated right ventricular systolic pressure is 26.0 mmHg.  3. S/p mitral valve repair with trivial regurgitation and mild stenosis  (mean gradient 4 mmHg). The mitral valve has been repaired/replaced.  4. The aortic valve is tricuspid. Aortic valve regurgitation is not  visualized. No aortic stenosis is present.  5. The inferior vena cava is normal in size with greater than 50%  respiratory variability, suggesting right atrial pressure of 3 mmHg   Neuro/Psych negative neurological ROS  negative psych ROS   GI/Hepatic negative GI ROS, Neg liver ROS,   Endo/Other  negative endocrine ROS  Renal/GU negative Renal ROS  negative genitourinary   Musculoskeletal negative musculoskeletal ROS (+)   Abdominal   Peds negative pediatric ROS (+)  Hematology negative hematology ROS (+)   Anesthesia Other Findings    Reproductive/Obstetrics negative OB ROS                            Anesthesia Physical Anesthesia Plan  ASA: IV  Anesthesia Plan: General   Post-op Pain Management:    Induction: Intravenous  PONV Risk Score and Plan: 2 and Ondansetron, Dexamethasone and Treatment may vary due to age or medical condition  Airway Management Planned: Oral ETT  Additional Equipment: Arterial line  Intra-op Plan:   Post-operative Plan: Extubation in OR  Informed Consent: I have reviewed the patients History and Physical, chart, labs and discussed the procedure including the risks, benefits and alternatives for the proposed anesthesia with the patient or authorized representative who has indicated his/her understanding and acceptance.     Dental advisory given  Plan Discussed with: CRNA and Surgeon  Anesthesia Plan Comments: (PAT note by Antionette Poles, PA-C: Follows with cardiology for history of CAD s/p CABG x4 on 07/07/20. Presented with acute inferior/anterolateral MI shortly after back surgery.  Found to have severe three-vessel disease mitral regurgitation and an apical thrombus.  Course was complicated by cardiogenic shock and septic pneumonia.  Ultimately underwent CABG x4 (LIMA-LAD, left radial-OM1, sequential SVG-PDA/PLV) and mitral valve repair with Impella support.  Surgery was complicated by local tamponade of the right atrium.  Postoperative atrial fibrillation was treated with amiodarone.  He has persistent LV dysfunction despite aggressive management with guideline directed therapy.  He has been followed by Dr. Shirlee Latch for this and was recently referred to Dr. Graciela Husbands for consideration of implantation of ICD for primary prevention.  Per Dr. Odessa Fleming note, it was also discussed that the patient was in need  of further back surgery.  Patient underwent implantation of [employer] ICD on 01/05/2021. Dr. Graciela Husbands did mention that the patient would be having neurosurgical  procedure the following day in his interval H&P.  Per note, "On his Coumadin hold, he is scheduled for neurosurgery tomorrow."    BMP and CBC from 12/30/2020 reviewed, unremarkable.  Will need day of surgery evaluation.  TTE 12/01/2020: 1. No LV thrombus. Left ventricular ejection fraction, by estimation, is  20 to 25%. The left ventricle has severely decreased function. The left  ventricle demonstrates global hypokinesis. There is mild left ventricular  hypertrophy. Left ventricular  diastolic parameters are consistent with Grade I diastolic dysfunction  (impaired relaxation).  2. Right ventricular systolic function is mildly reduced. The right  ventricular size is normal. There is normal pulmonary artery systolic  pressure. The estimated right ventricular systolic pressure is 26.0 mmHg.  3. S/p mitral valve repair with trivial regurgitation and mild stenosis  (mean gradient 4 mmHg). The mitral valve has been repaired/replaced.  4. The aortic valve is tricuspid. Aortic valve regurgitation is not  visualized. No aortic stenosis is present.  5. The inferior vena cava is normal in size with greater than 50%  respiratory variability, suggesting right atrial pressure of 3 mmHg.  )      Anesthesia Quick Evaluation

## 2021-01-05 NOTE — Discharge Instructions (Signed)
    Supplemental Discharge Instructions for  Pacemaker/Defibrillator Patients  Tomorrow, 01/06/21, send in a device transmission  Activity No heavy lifting or vigorous activity with your left/right arm for 6 to 8 weeks.  Do not raise your left/right arm above your head for one week.  Gradually raise your affected arm as drawn below.            01/13/21                      01/14/21                    01/15/21                   01/16/21  NO DRIVING until your wound check visit  WOUND CARE - Keep the wound area clean and dry.  Do not get this area wet , no showers until cleared to at your wound check visit - Tomorrow, 01/06/21, remove the arm sling - Tomorrow, 01/06/21 remove the outer plastic bandage.  Underneath the plastic bandage there are steri strips (paper tapes), DO NOT remove these. - The tape/steri-strips on your wound will fall off; do not pull them off.  No bandage is needed on the site.  DO  NOT apply any creams, oils, or ointments to the wound area. - If you notice any drainage or discharge from the wound, any swelling or bruising at the site, or you develop a fever > 101? F after you are discharged home, call the office at once.  Special Instructions - You are still able to use cellular telephones; use the ear opposite the side where you have your pacemaker/defibrillator.  Avoid carrying your cellular phone near your device. - When traveling through airports, show security personnel your identification card to avoid being screened in the metal detectors.  Ask the security personnel to use the hand wand. - Avoid arc welding equipment, MRI testing (magnetic resonance imaging), TENS units (transcutaneous nerve stimulators).  Call the office for questions about other devices. - Avoid electrical appliances that are in poor condition or are not properly grounded. - Microwave ovens are safe to be near or to operate.  Additional information for defibrillator patients should your device go  off: - If your device goes off ONCE and you feel fine afterward, notify the device clinic nurses. - If your device goes off ONCE and you do not feel well afterward, call 911. - If your device goes off TWICE, call 911. - If your device goes off THREE times in one day, call 911.  DO NOT DRIVE YOURSELF OR A FAMILY MEMBER WITH A DEFIBRILLATOR TO THE HOSPITAL--CALL 911.

## 2021-01-05 NOTE — Progress Notes (Signed)
Off monitor for cxr

## 2021-01-05 NOTE — Progress Notes (Addendum)
preop instructions given to mother.    Update: spoke with Alvino Chapel, nurse in procedural.  Alvino Chapel stated that patient is aware to hold coumadin until further instructed by surgeon.

## 2021-01-05 NOTE — Interval H&P Note (Signed)
History and Physical Interval Note:  01/05/2021 10:46 AM  Jerry [name]  has presented today for surgery, with the diagnosis of cardiomyopathy.  The various methods of treatment have been discussed with the patient and family. After consideration of risks, benefits and other options for treatment, the patient has consented to  Procedure(s): ICD IMPLANT (N/A) as a surgical intervention.  The patient's history has been reviewed, patient examined, no change in status, stable for surgery.  I have reviewed the patient's chart and labs.  Questions were answered to the patient's satisfaction.     Sherryl Manges  On his Coumadin hold, he is scheduled for neurosurgery tomorrow

## 2021-01-06 ENCOUNTER — Encounter (HOSPITAL_COMMUNITY): Payer: Self-pay | Admitting: Internal Medicine

## 2021-01-06 ENCOUNTER — Inpatient Hospital Stay (HOSPITAL_COMMUNITY): Payer: 59 | Admitting: Physician Assistant

## 2021-01-06 ENCOUNTER — Other Ambulatory Visit: Payer: Self-pay

## 2021-01-06 ENCOUNTER — Inpatient Hospital Stay (HOSPITAL_COMMUNITY): Payer: 59

## 2021-01-06 ENCOUNTER — Encounter (HOSPITAL_COMMUNITY)
Admission: RE | Disposition: A | Discharge status: Home / Self Care | Payer: Self-pay | Source: Home / Self Care | Attending: Neurological Surgery

## 2021-01-06 ENCOUNTER — Inpatient Hospital Stay (HOSPITAL_COMMUNITY)
Admission: RE | Admit: 2021-01-06 | Discharge: 2021-01-07 | DRG: 227 | Disposition: A | Discharge status: Home / Self Care | Payer: 59 | Attending: Neurological Surgery | Admitting: Neurological Surgery

## 2021-01-06 DIAGNOSIS — Z7984 Long term (current) use of oral hypoglycemic drugs: Secondary | ICD-10-CM | POA: Diagnosis not present

## 2021-01-06 DIAGNOSIS — I255 Ischemic cardiomyopathy: Secondary | ICD-10-CM | POA: Diagnosis present

## 2021-01-06 DIAGNOSIS — M5116 Intervertebral disc disorders with radiculopathy, lumbar region: Secondary | ICD-10-CM | POA: Diagnosis present

## 2021-01-06 DIAGNOSIS — Z20822 Contact with and (suspected) exposure to covid-19: Secondary | ICD-10-CM | POA: Diagnosis present

## 2021-01-06 DIAGNOSIS — F419 Anxiety disorder, unspecified: Secondary | ICD-10-CM | POA: Diagnosis present

## 2021-01-06 DIAGNOSIS — Z951 Presence of aortocoronary bypass graft: Secondary | ICD-10-CM

## 2021-01-06 DIAGNOSIS — I2510 Atherosclerotic heart disease of native coronary artery without angina pectoris: Secondary | ICD-10-CM | POA: Diagnosis present

## 2021-01-06 DIAGNOSIS — I10 Essential (primary) hypertension: Secondary | ICD-10-CM | POA: Diagnosis present

## 2021-01-06 DIAGNOSIS — Z7982 Long term (current) use of aspirin: Secondary | ICD-10-CM | POA: Diagnosis not present

## 2021-01-06 DIAGNOSIS — I252 Old myocardial infarction: Secondary | ICD-10-CM

## 2021-01-06 DIAGNOSIS — Z7901 Long term (current) use of anticoagulants: Secondary | ICD-10-CM | POA: Diagnosis not present

## 2021-01-06 DIAGNOSIS — M5416 Radiculopathy, lumbar region: Secondary | ICD-10-CM | POA: Diagnosis present

## 2021-01-06 DIAGNOSIS — Z419 Encounter for procedure for purposes other than remedying health state, unspecified: Secondary | ICD-10-CM

## 2021-01-06 DIAGNOSIS — Z79899 Other long term (current) drug therapy: Secondary | ICD-10-CM

## 2021-01-06 DIAGNOSIS — Z79891 Long term (current) use of opiate analgesic: Secondary | ICD-10-CM

## 2021-01-06 DIAGNOSIS — I513 Intracardiac thrombosis, not elsewhere classified: Secondary | ICD-10-CM | POA: Diagnosis present

## 2021-01-06 HISTORY — PX: TRANSFORAMINAL LUMBAR INTERBODY FUSION W/ MIS 1 LEVEL: SHX6145

## 2021-01-06 LAB — TYPE AND SCREEN
ABO/RH(D): O POS
Antibody Screen: NEGATIVE

## 2021-01-06 LAB — SURGICAL PCR SCREEN
MRSA, PCR: NEGATIVE
Staphylococcus aureus: NEGATIVE

## 2021-01-06 SURGERY — MINIMALLY INVASIVE (MIS) TRANSFORAMINAL LUMBAR INTERBODY FUSION (TLIF) 1 LEVEL
Anesthesia: General | Laterality: Right

## 2021-01-06 MED ORDER — MENTHOL 3 MG MT LOZG: 1.0000 | LOZENGE | OROMUCOSAL | Status: DC | PRN

## 2021-01-06 MED ORDER — CYCLOBENZAPRINE HCL 10 MG PO TABS
10.0000 mg | ORAL_TABLET | Freq: Three times a day (TID) | ORAL | Status: DC | PRN
  Administered 2021-01-06 – 2021-01-07 (×3): 10 mg via ORAL
  Filled 2021-01-06 (×2): qty 1

## 2021-01-06 MED ORDER — MIDAZOLAM HCL 2 MG/2ML IJ SOLN
INTRAMUSCULAR | Status: AC
  Filled 2021-01-06: qty 2

## 2021-01-06 MED ORDER — SPIRONOLACTONE 25 MG PO TABS
25.0000 mg | ORAL_TABLET | Freq: Every day | ORAL | Status: DC
  Administered 2021-01-06: 25 mg via ORAL
  Filled 2021-01-06: qty 1

## 2021-01-06 MED ORDER — CEFAZOLIN SODIUM-DEXTROSE 2-4 GM/100ML-% IV SOLN
INTRAVENOUS | Status: AC
  Filled 2021-01-06: qty 100

## 2021-01-06 MED ORDER — OXYCODONE HCL 5 MG PO TABS
10.0000 mg | ORAL_TABLET | ORAL | Status: DC | PRN
  Administered 2021-01-06 – 2021-01-07 (×4): 10 mg via ORAL
  Filled 2021-01-06 (×4): qty 2

## 2021-01-06 MED ORDER — ONDANSETRON HCL 4 MG/2ML IJ SOLN: 4.0000 mg | Freq: Once | INTRAMUSCULAR | Status: DC | PRN

## 2021-01-06 MED ORDER — ONDANSETRON HCL 4 MG PO TABS: 4.0000 mg | ORAL_TABLET | Freq: Four times a day (QID) | ORAL | Status: DC | PRN

## 2021-01-06 MED ORDER — DOCUSATE SODIUM 100 MG PO CAPS
100.0000 mg | ORAL_CAPSULE | Freq: Two times a day (BID) | ORAL | Status: DC
  Administered 2021-01-06 – 2021-01-07 (×2): 100 mg via ORAL
  Filled 2021-01-06 (×2): qty 1

## 2021-01-06 MED ORDER — CHLORHEXIDINE GLUCONATE 0.12 % MT SOLN
15.0000 mL | OROMUCOSAL | Status: AC
  Filled 2021-01-06: qty 15

## 2021-01-06 MED ORDER — OXYCODONE HCL 5 MG PO TABS: 5.0000 mg | ORAL_TABLET | ORAL | Status: DC | PRN

## 2021-01-06 MED ORDER — SODIUM CHLORIDE 0.9% FLUSH
3.0000 mL | Freq: Two times a day (BID) | INTRAVENOUS | Status: DC
  Administered 2021-01-06 – 2021-01-07 (×2): 3 mL via INTRAVENOUS

## 2021-01-06 MED ORDER — LIDOCAINE-EPINEPHRINE 1 %-1:100000 IJ SOLN
INTRAMUSCULAR | Status: AC
  Filled 2021-01-06: qty 1

## 2021-01-06 MED ORDER — HYDROMORPHONE HCL 1 MG/ML IJ SOLN
1.0000 mg | INTRAMUSCULAR | Status: DC | PRN
  Administered 2021-01-06 – 2021-01-07 (×3): 1 mg via INTRAVENOUS
  Filled 2021-01-06 (×3): qty 1

## 2021-01-06 MED ORDER — MIDAZOLAM HCL 2 MG/2ML IJ SOLN
INTRAMUSCULAR | Status: DC | PRN
  Administered 2021-01-06: 2 mg via INTRAVENOUS

## 2021-01-06 MED ORDER — DIGOXIN 125 MCG PO TABS
0.0625 mg | ORAL_TABLET | Freq: Every morning | ORAL | Status: DC
  Filled 2021-01-06: qty 1

## 2021-01-06 MED ORDER — PHENOL 1.4 % MT LIQD: 1.0000 | OROMUCOSAL | Status: DC | PRN

## 2021-01-06 MED ORDER — CARVEDILOL 3.125 MG PO TABS
3.1250 mg | ORAL_TABLET | Freq: Two times a day (BID) | ORAL | Status: DC
  Filled 2021-01-06: qty 1

## 2021-01-06 MED ORDER — SACUBITRIL-VALSARTAN 49-51 MG PO TABS
1.0000 | ORAL_TABLET | Freq: Two times a day (BID) | ORAL | Status: DC
  Administered 2021-01-06: 1 via ORAL
  Filled 2021-01-06 (×3): qty 1

## 2021-01-06 MED ORDER — ACETAMINOPHEN 325 MG PO TABS: 650.0000 mg | ORAL_TABLET | ORAL | Status: DC | PRN

## 2021-01-06 MED ORDER — ONDANSETRON HCL 4 MG/2ML IJ SOLN: 4.0000 mg | Freq: Four times a day (QID) | INTRAMUSCULAR | Status: DC | PRN

## 2021-01-06 MED ORDER — PROPOFOL 10 MG/ML IV BOLUS
INTRAVENOUS | Status: DC | PRN
  Administered 2021-01-06: 100 mg via INTRAVENOUS

## 2021-01-06 MED ORDER — ROCURONIUM BROMIDE 10 MG/ML (PF) SYRINGE
PREFILLED_SYRINGE | INTRAVENOUS | Status: AC
  Filled 2021-01-06: qty 10

## 2021-01-06 MED ORDER — CEFAZOLIN SODIUM-DEXTROSE 2-4 GM/100ML-% IV SOLN
2.0000 g | INTRAVENOUS | Status: AC
  Administered 2021-01-06 (×2): 2 g via INTRAVENOUS

## 2021-01-06 MED ORDER — CHLORHEXIDINE GLUCONATE CLOTH 2 % EX PADS: 6.0000 | MEDICATED_PAD | Freq: Once | CUTANEOUS | Status: DC

## 2021-01-06 MED ORDER — ATORVASTATIN CALCIUM 80 MG PO TABS
80.0000 mg | ORAL_TABLET | Freq: Every morning | ORAL | Status: DC
  Administered 2021-01-07: 80 mg via ORAL
  Filled 2021-01-06: qty 1

## 2021-01-06 MED ORDER — FENTANYL CITRATE (PF) 250 MCG/5ML IJ SOLN
INTRAMUSCULAR | Status: DC | PRN
  Administered 2021-01-06 (×5): 50 ug via INTRAVENOUS

## 2021-01-06 MED ORDER — SUGAMMADEX SODIUM 200 MG/2ML IV SOLN
INTRAVENOUS | Status: DC | PRN
  Administered 2021-01-06: 200 mg via INTRAVENOUS

## 2021-01-06 MED ORDER — EMPAGLIFLOZIN 10 MG PO TABS
10.0000 mg | ORAL_TABLET | Freq: Every day | ORAL | Status: DC
  Administered 2021-01-07: 10 mg via ORAL
  Filled 2021-01-06 (×2): qty 1

## 2021-01-06 MED ORDER — LACTATED RINGERS IV SOLN: INTRAVENOUS | Status: DC

## 2021-01-06 MED ORDER — POLYETHYLENE GLYCOL 3350 17 G PO PACK: 17.0000 g | PACK | Freq: Every day | ORAL | Status: DC | PRN

## 2021-01-06 MED ORDER — HYDROMORPHONE HCL 1 MG/ML IJ SOLN
0.2500 mg | INTRAMUSCULAR | Status: DC | PRN
Start: 2021-01-06 — End: 2021-01-06
  Administered 2021-01-06 (×3): 0.5 mg via INTRAVENOUS

## 2021-01-06 MED ORDER — ROCURONIUM BROMIDE 10 MG/ML (PF) SYRINGE
PREFILLED_SYRINGE | INTRAVENOUS | Status: DC | PRN
  Administered 2021-01-06: 70 mg via INTRAVENOUS
  Administered 2021-01-06 (×2): 30 mg via INTRAVENOUS

## 2021-01-06 MED ORDER — FENTANYL CITRATE (PF) 250 MCG/5ML IJ SOLN
INTRAMUSCULAR | Status: AC
  Filled 2021-01-06: qty 5

## 2021-01-06 MED ORDER — CYCLOBENZAPRINE HCL 10 MG PO TABS
ORAL_TABLET | ORAL | Status: AC
  Filled 2021-01-06: qty 1

## 2021-01-06 MED ORDER — LIDOCAINE 2% (20 MG/ML) 5 ML SYRINGE
INTRAMUSCULAR | Status: DC | PRN
  Administered 2021-01-06: 100 mg via INTRAVENOUS

## 2021-01-06 MED ORDER — PHENYLEPHRINE HCL (PRESSORS) 10 MG/ML IV SOLN
INTRAVENOUS | Status: AC
  Filled 2021-01-06: qty 1

## 2021-01-06 MED ORDER — ADULT MULTIVITAMIN W/MINERALS CH
1.0000 | ORAL_TABLET | Freq: Every morning | ORAL | Status: DC
  Administered 2021-01-07: 1 via ORAL
  Filled 2021-01-06: qty 1

## 2021-01-06 MED ORDER — PHENYLEPHRINE 40 MCG/ML (10ML) SYRINGE FOR IV PUSH (FOR BLOOD PRESSURE SUPPORT)
PREFILLED_SYRINGE | INTRAVENOUS | Status: DC | PRN
  Administered 2021-01-06: 160 ug via INTRAVENOUS
  Administered 2021-01-06 (×2): 120 ug via INTRAVENOUS

## 2021-01-06 MED ORDER — ALBUMIN HUMAN 5 % IV SOLN: INTRAVENOUS | Status: DC | PRN

## 2021-01-06 MED ORDER — CHLORHEXIDINE GLUCONATE 0.12 % MT SOLN
OROMUCOSAL | Status: AC
  Administered 2021-01-06: 15 mL via OROMUCOSAL
  Filled 2021-01-06: qty 15

## 2021-01-06 MED ORDER — 0.9 % SODIUM CHLORIDE (POUR BTL) OPTIME
TOPICAL | Status: DC | PRN
  Administered 2021-01-06: 1000 mL

## 2021-01-06 MED ORDER — ALPRAZOLAM 0.5 MG PO TABS
0.5000 mg | ORAL_TABLET | Freq: Every day | ORAL | Status: DC
  Administered 2021-01-06: 0.5 mg via ORAL
  Filled 2021-01-06: qty 2

## 2021-01-06 MED ORDER — TRAZODONE HCL 50 MG PO TABS
100.0000 mg | ORAL_TABLET | Freq: Every day | ORAL | Status: DC
  Administered 2021-01-06: 100 mg via ORAL
  Filled 2021-01-06: qty 2

## 2021-01-06 MED ORDER — LIDOCAINE-EPINEPHRINE 1 %-1:100000 IJ SOLN
INTRAMUSCULAR | Status: DC | PRN
  Administered 2021-01-06: 10 mL

## 2021-01-06 MED ORDER — HYDROMORPHONE HCL 1 MG/ML IJ SOLN
INTRAMUSCULAR | Status: AC
  Filled 2021-01-06: qty 1

## 2021-01-06 MED ORDER — ACETAMINOPHEN 650 MG RE SUPP: 650.0000 mg | RECTAL | Status: DC | PRN

## 2021-01-06 MED ORDER — THROMBIN 5000 UNITS EX SOLR
CUTANEOUS | Status: AC
  Filled 2021-01-06: qty 5000

## 2021-01-06 MED ORDER — PROPOFOL 10 MG/ML IV BOLUS
INTRAVENOUS | Status: AC
  Filled 2021-01-06: qty 20

## 2021-01-06 MED ORDER — CEFAZOLIN SODIUM-DEXTROSE 2-4 GM/100ML-% IV SOLN
2.0000 g | Freq: Three times a day (TID) | INTRAVENOUS | Status: AC
  Administered 2021-01-07 (×2): 2 g via INTRAVENOUS
  Filled 2021-01-06 (×2): qty 100

## 2021-01-06 MED ORDER — DEXAMETHASONE SODIUM PHOSPHATE 10 MG/ML IJ SOLN
INTRAMUSCULAR | Status: DC | PRN
  Administered 2021-01-06: 10 mg via INTRAVENOUS

## 2021-01-06 MED ORDER — CEFAZOLIN SODIUM 1 G IJ SOLR
INTRAMUSCULAR | Status: AC
  Filled 2021-01-06: qty 20

## 2021-01-06 MED ORDER — SODIUM CHLORIDE 0.9 % IV SOLN
250.0000 mL | INTRAVENOUS | Status: DC
  Administered 2021-01-06: 250 mL via INTRAVENOUS

## 2021-01-06 MED ORDER — SODIUM CHLORIDE 0.9% FLUSH: 3.0000 mL | INTRAVENOUS | Status: DC | PRN

## 2021-01-06 MED ORDER — THROMBIN 5000 UNITS EX SOLR: OROMUCOSAL | Status: DC | PRN

## 2021-01-06 MED ORDER — PHENYLEPHRINE HCL-NACL 10-0.9 MG/250ML-% IV SOLN
INTRAVENOUS | Status: DC | PRN
  Administered 2021-01-06: 20 ug/min via INTRAVENOUS

## 2021-01-06 MED FILL — Lidocaine HCl Local Preservative Free (PF) Inj 1%: INTRAMUSCULAR | Qty: 60 | Status: AC

## 2021-01-06 SURGICAL SUPPLY — 70 items
ADH SKN CLS APL DERMABOND .7 (GAUZE/BANDAGES/DRESSINGS) ×1
ADH SKN CLS LQ APL DERMABOND (GAUZE/BANDAGES/DRESSINGS) ×1
BAND INSRT 18 STRL LF DISP RB (MISCELLANEOUS) ×2
BAND RUBBER #18 3X1/16 STRL (MISCELLANEOUS) ×4 IMPLANT
BASKET BONE COLLECTION (BASKET) ×2 IMPLANT
BLADE CLIPPER SURG (BLADE) ×1 IMPLANT
BLADE SURG 11 STRL SS (BLADE) ×2 IMPLANT
BUR MATCHSTICK NEURO 3.0 LAGG (BURR) IMPLANT
BUR PRECISION FLUTE 5.0 (BURR) ×1 IMPLANT
BUR PRECISION MATCH 3.0 13 (BURR) ×2 IMPLANT
CNTNR URN SCR LID CUP LEK RST (MISCELLANEOUS) ×1 IMPLANT
CONT SPEC 4OZ STRL OR WHT (MISCELLANEOUS) ×2
COVER BACK TABLE 60X90IN (DRAPES) ×2 IMPLANT
COVER WAND RF STERILE (DRAPES) ×1 IMPLANT
DECANTER SPIKE VIAL GLASS SM (MISCELLANEOUS) ×2 IMPLANT
DERMABOND ADHESIVE PROPEN (GAUZE/BANDAGES/DRESSINGS) ×1
DERMABOND ADVANCED (GAUZE/BANDAGES/DRESSINGS) ×1
DERMABOND ADVANCED .7 DNX12 (GAUZE/BANDAGES/DRESSINGS) ×1 IMPLANT
DERMABOND ADVANCED .7 DNX6 (GAUZE/BANDAGES/DRESSINGS) IMPLANT
DEVICE INTERBODY ELEVATE 23X8 (Cage) ×2 IMPLANT
DRAPE C-ARM 42X72 X-RAY (DRAPES) ×2 IMPLANT
DRAPE C-ARMOR (DRAPES) ×2 IMPLANT
DRAPE LAPAROTOMY 100X72X124 (DRAPES) ×2 IMPLANT
DRAPE MICROSCOPE LEICA (MISCELLANEOUS) ×2 IMPLANT
DRAPE SURG 17X23 STRL (DRAPES) ×2 IMPLANT
ELECT BLADE 6.5 EXT (BLADE) ×2 IMPLANT
ELECT REM PT RETURN 9FT ADLT (ELECTROSURGICAL) ×2
ELECTRODE REM PT RTRN 9FT ADLT (ELECTROSURGICAL) ×1 IMPLANT
EXTENDER TAB GUIDE SV 5.5/6.0 (INSTRUMENTS) ×8 IMPLANT
GAUZE 4X4 16PLY RFD (DISPOSABLE) IMPLANT
GAUZE SPONGE 4X4 12PLY STRL (GAUZE/BANDAGES/DRESSINGS) ×2 IMPLANT
GLOVE BIOGEL PI IND STRL 7.5 (GLOVE) ×1 IMPLANT
GLOVE BIOGEL PI INDICATOR 7.5 (GLOVE) ×1
GLOVE ECLIPSE 7.5 STRL STRAW (GLOVE) ×2 IMPLANT
GOWN STRL REUS W/ TWL LRG LVL3 (GOWN DISPOSABLE) ×1 IMPLANT
GOWN STRL REUS W/ TWL XL LVL3 (GOWN DISPOSABLE) IMPLANT
GOWN STRL REUS W/TWL 2XL LVL3 (GOWN DISPOSABLE) ×2 IMPLANT
GOWN STRL REUS W/TWL LRG LVL3 (GOWN DISPOSABLE) ×2
GOWN STRL REUS W/TWL XL LVL3 (GOWN DISPOSABLE)
GUIDEWIRE BLUNT NT 450 (WIRE) ×4 IMPLANT
HEMOSTAT POWDER KIT SURGIFOAM (HEMOSTASIS) ×2 IMPLANT
KIT BASIN OR (CUSTOM PROCEDURE TRAY) ×2 IMPLANT
KIT POSITION SURG JACKSON T1 (MISCELLANEOUS) ×2 IMPLANT
KIT TURNOVER KIT B (KITS) IMPLANT
NDL BEVEL TWO-PAK W/1PK (NEEDLE) IMPLANT
NDL HYPO 18GX1.5 BLUNT FILL (NEEDLE) IMPLANT
NDL SPNL 18GX3.5 QUINCKE PK (NEEDLE) IMPLANT
NEEDLE BEVEL TWO-PAK W/1PK (NEEDLE) ×2 IMPLANT
NEEDLE HYPO 18GX1.5 BLUNT FILL (NEEDLE) IMPLANT
NEEDLE HYPO 22GX1.5 SAFETY (NEEDLE) ×2 IMPLANT
NEEDLE SPNL 18GX3.5 QUINCKE PK (NEEDLE) ×2 IMPLANT
NS IRRIG 1000ML POUR BTL (IV SOLUTION) ×2 IMPLANT
PACK LAMINECTOMY NEURO (CUSTOM PROCEDURE TRAY) ×2 IMPLANT
PAD ARMBOARD 7.5X6 YLW CONV (MISCELLANEOUS) ×4 IMPLANT
ROD 5.5X45MM SOLERA VOYAGER (Rod) ×1 IMPLANT
ROD PERC CCM 5.5X35 (Rod) ×1 IMPLANT
SCREW 6.5X35 VOYAGER MAS FNS (Screw) ×4 IMPLANT
SCREW SET 5.5/6.0MM SOLERA (Screw) ×4 IMPLANT
SPACER SPNL STD 23X8XSTRL (Cage) IMPLANT
SPCR SPNL STD 23X8XSTRL (Cage) ×1 IMPLANT
SPONGE LAP 4X18 RFD (DISPOSABLE) IMPLANT
SUT MNCRL AB 3-0 PS2 18 (SUTURE) ×2 IMPLANT
SUT VIC AB 0 CT1 18XCR BRD8 (SUTURE) IMPLANT
SUT VIC AB 0 CT1 8-18 (SUTURE)
SUT VIC AB 2-0 CP2 18 (SUTURE) ×3 IMPLANT
SYR 3ML LL SCALE MARK (SYRINGE) IMPLANT
TOWEL GREEN STERILE (TOWEL DISPOSABLE) ×2 IMPLANT
TOWEL GREEN STERILE FF (TOWEL DISPOSABLE) ×2 IMPLANT
TRAY FOLEY MTR SLVR 16FR STAT (SET/KITS/TRAYS/PACK) ×1 IMPLANT
WATER STERILE IRR 1000ML POUR (IV SOLUTION) ×2 IMPLANT

## 2021-01-06 NOTE — Plan of Care (Addendum)
Patient arrived to floor stable. Patient pain is being controlled with PRNs.  Patient BP soft. Patient states SBP runs 90-100. Notified Dr. Danielle Dess about soft BP. Elsner verbally stated increase fluids to 124ml/hr for 4 hours and will reassess in morning.   Problem: Education: Goal: Knowledge of General Education information will improve Description: Including pain rating scale, medication(s)/side effects and non-pharmacologic comfort measures Outcome: Progressing   Problem: Activity: Goal: Risk for activity intolerance will decrease Outcome: Progressing   Problem: Pain Managment: Goal: General experience of comfort will improve Outcome: Progressing   Problem: Safety: Goal: Ability to remain free from injury will improve Outcome: Progressing   Problem: Skin Integrity: Goal: Risk for impaired skin integrity will decrease Outcome: Progressing

## 2021-01-06 NOTE — Transfer of Care (Signed)
Immediate Anesthesia Transfer of Care Note  Patient: Jerry [name]  Procedure(s) Performed: Right Lumbar 4-5 Minimally invasive transforaminal lumbar interbody fusion (Right )  Patient Location: PACU  Anesthesia Type:General  Level of Consciousness: drowsy and patient cooperative  Airway & Oxygen Therapy: Patient Spontanous Breathing and Patient connected to nasal cannula oxygen  Post-op Assessment: Report given to RN and Post -op Vital signs reviewed and stable  Post vital signs: Reviewed and stable  Last Vitals:  Vitals Value Taken Time  BP 112/78 01/06/21 1824  Temp    Pulse 89 01/06/21 1826  Resp 10 01/06/21 1826  SpO2 100 % 01/06/21 1826  Vitals shown include unvalidated device data.  Last Pain:  Vitals:   01/06/21 1215  TempSrc:   PainSc: 7       Patients Stated Pain Goal: 4 (01/06/21 1215)  Complications: No complications documented.

## 2021-01-06 NOTE — Discharge Instructions (Signed)
Discharge Instructions  No restriction in activities, slowly increase your activity back to normal.   Your incision is closed with dermabond (purple glue). This will naturally fall off over the next 1-2 weeks.   Okay to shower on the day of discharge. Use regular soap and water and try to be gentle when cleaning your incision.   Follow up with Dr. Maurice Small in 2 weeks after discharge. If you do not already have a discharge appointment, please call his office at [phone] to schedule a follow up appointment. If you have any concerns or questions, please call the office and let us know.  Restart your aspirin and warfarin on 01/09/21.

## 2021-01-06 NOTE — Op Note (Signed)
PATIENT: Engineer, site  DAY OF SURGERY: 01/06/21   PRE-OPERATIVE DIAGNOSIS:  Lumbar radiculopathy, recurrent far lateral disc herniation   POST-OPERATIVE DIAGNOSIS:  Same   PROCEDURE:  Right L4-L5 minimally invasive transforaminal lumbar interbody fusion with bilateral L4-L5 pedicle screw placement   SURGEON:  Surgeon(s) and Role:    Jadene Pierini, MD - Primary   ANESTHESIA: ETGA   BRIEF HISTORY: This is a 50 year old man, well known to me, in whom I previously performed a far lateral MIS discectomy. He had recurrence and a repeat far lateral MIS discectomy. Unfortunately, a few days post-op he had a large MI and almost didn't survive. He recovered remarkably well from his MI, but unfortunately had recurrence of his radicular symptoms with some foot weakness. He has been miserable with the pain from his radiculopathy and developed some weakness of his distal right lower extremity. An MRI showed a recurrent disc herniation at that level. We discussed this at length, given his cardiac disease and the increased risk of perioperative complications. I had a frank conversation with him and his wife. They were both quite adamant that he would not want to live like this, accepted the risks, and wanted to proceed with surgery. We discussed risks, benefits, and alternatives and he wished to proceed with surgery.   OPERATIVE DETAIL:  The patient was taken to the operating room and placed on the OR table in the prone position. A formal time out was performed with two patient identifiers and confirmed the operative site. Anesthesia was induced by the anesthesia team. The operative site was marked, hair was clipped with surgical clippers, the area was then prepped and draped in a sterile fashion.   Fluoroscopy was used to localize the surgical level. The pedicles were marked and used to create skin incisions bilaterally. With fluoro guidance, Jamshidi needles were used to guide K-wires into the bilateral  L4 and L5 pedicles. The K wires were then secured with hemostats and attention turned to the TLIF.  A [employer] tube was then docked to the right L4-5 facet through the same incision using fluoroscopy. The right pars and facet L4-5 were removed and the right L4 nerve root was decompressed along its entire course. As expected, there was significant scar tissue in this region from his prior surgeries. I therefore extended the dissection medially to identify the lateral thecal sac and locate the traversing nerve. This was taken superiorly until finding the superior pedicle and following the foramen distally, which was unroofed. The nerve root was very scarred in, I freed it up along its course. This was, as expected, most difficult at the disc space where the disc was scarred into the nerve. I eventually mobilized it off the disc space in all directions and freed it caudally past the disc space.   The disc space was identified, incised, and a discectomy was performed in the standard fashion. After removing some disc, I used the Epstein curette to reduce the far lateral disc herniation into the disc space and remove it. The endplates were prepped, bone graft was packed into the disc space, and an expandable cage ([employer]) was packed with autograft and placed into the disc space with fluoroscopic confirmation. The tube was removed and hemostasis was obtained during its removal.   Using the previously placed K wires, a tap and then screw with tower were placed bilaterally at L4 and L5. A rod was sized and introduced on both sides, confirmed with fluoroscopy, then final tightened. Hemostasis  was again confirmed for both incisions, they were copiously irrigated, and then closed in layers.    EBL:  108mL   DRAINS: none   SPECIMENS: none   Jadene Pierini, MD 01/06/21 1:48 PM

## 2021-01-06 NOTE — Progress Notes (Signed)
Made Dr. Okey Dupre aware of new ICD placement yesterday. MD stated to call Dr. Graciela Husbands to advise on ICD for today's procedure. Dr. Graciela Husbands stated "to call rep to turn off ICD" since patient would be prone for surgery. Joey, [employer] ICD rep paged. Awaiting return call at this time.

## 2021-01-06 NOTE — Progress Notes (Signed)
Joey with [employer] at bedside to turn patient's ICD on.

## 2021-01-06 NOTE — Anesthesia Procedure Notes (Signed)
Arterial Line Insertion Start/End4/14/2022 1:35 PM Performed by: Drema Pry, CRNA, CRNA  Patient location: Pre-op. Preanesthetic checklist: patient identified, IV checked, risks and benefits discussed, surgical consent, monitors and equipment checked and pre-op evaluation Lidocaine 1% used for infiltration Right, radial was placed Catheter size: 20 G Hand hygiene performed  and maximum sterile barriers used   Attempts: 1 Procedure performed without using ultrasound guided technique. Following insertion, dressing applied and Biopatch. Post procedure assessment: normal  Patient tolerated the procedure well with no immediate complications.

## 2021-01-06 NOTE — Progress Notes (Signed)
Received call from [place], [employer] rep. Stated he would be available around 1pm for today's procedure. Made anesthesia aware.

## 2021-01-06 NOTE — Brief Op Note (Signed)
01/06/2021  6:12 PM  PATIENT:  Jerry [name]  50 y.o. male  PRE-OPERATIVE DIAGNOSIS:  Radiculopathy, Lumbar region  POST-OPERATIVE DIAGNOSIS:  * No post-op diagnosis entered *  PROCEDURE:  Procedure(s) with comments: Right Lumbar 4-5 Minimally invasive transforaminal lumbar interbody fusion (Right) - 3C/RM 19  SURGEON:  Surgeon(s) and Role:    * , Clovis Pu, MD - Primary  PHYSICIAN ASSISTANT:   ASSISTANTS: none   ANESTHESIA:   general  EBL:  100 mL   BLOOD ADMINISTERED:none  DRAINS: none   LOCAL MEDICATIONS USED:  LIDOCAINE   SPECIMEN:  No Specimen  DISPOSITION OF SPECIMEN:  N/A  COUNTS:  YES  TOURNIQUET:  * No tourniquets in log *  DICTATION: .Note written in EPIC  PLAN OF CARE: Admit to inpatient   PATIENT DISPOSITION:  PACU - hemodynamically stable.   Delay start of Pharmacological VTE agent (>24hrs) due to surgical blood loss or risk of bleeding: yes

## 2021-01-06 NOTE — Anesthesia Procedure Notes (Signed)
Procedure Name: Intubation Date/Time: 01/06/2021 2:03 PM Performed by: Janace Litten, CRNA Pre-anesthesia Checklist: Patient identified, Emergency Drugs available, Suction available and Patient being monitored Patient Re-evaluated:Patient Re-evaluated prior to induction Oxygen Delivery Method: Circle System Utilized Preoxygenation: Pre-oxygenation with 100% oxygen Induction Type: IV induction Ventilation: Mask ventilation without difficulty Laryngoscope Size: Mac and 4 Grade View: Grade I Tube type: Oral Tube size: 7.5 mm Number of attempts: 1 Airway Equipment and Method: Stylet Placement Confirmation: ETT inserted through vocal cords under direct vision,  positive ETCO2 and breath sounds checked- equal and bilateral Secured at: 23 cm Tube secured with: Tape Dental Injury: Teeth and Oropharynx as per pre-operative assessment

## 2021-01-06 NOTE — Progress Notes (Signed)
Joey, [employer] rep, at bedside

## 2021-01-06 NOTE — Discharge Summary (Signed)
Discharge Summary  Date of Admission: 01/06/2021  Date of Discharge: 01/07/2021  Attending Physician: Autumn Patty, MD  Hospital Course: Patient was admitted following an uncomplicated right L4-5 MIS TLIF. He was recovered in PACU and transferred to [facility]. His hospital course was uncomplicated and the patient was discharged home on 01/07/21. He will restart his aspirin and warfarin on post-op day 3 and follow up in clinic with me in 2 weeks.  Neurologic exam at discharge:  Strength 5/5 x4, SILTx4  Discharge diagnosis: lumbar radiculopathy  Stefani Dama, MD 01/07/21 2:38 PM

## 2021-01-06 NOTE — H&P (Signed)
Surgical H&P Update  HPI: 50 y.o. man with RLE radicular symptoms due to recurrent far lateral disc herniation. As planned, he had his ICD placed yesterday without issue. No changes in health since he was last seen. Still having radicular symptoms and leg weakness, wishes to proceed with surgery.  PMHx:  Past Medical History:  Diagnosis Date  . AICD (automatic cardioverter/defibrillator) present 01/05/2021   [employer]  . Anxiety   . Coronary artery disease   . Essential (primary) hypertension 06/17/2020  . Myocardial infarction (HCC)   . Radiculopathy, lumbar region 11/14/2019   FamHx: History reviewed. No pertinent family history. SocHx:  reports that he has never smoked. He has never used smokeless tobacco. He reports current alcohol use of about 1.0 standard drink of alcohol per week. He reports that he does not use drugs.  Physical Exam: AOx3, PERRL, FS, TM  Strength 5/5 x4 except 4/5 in the right TA/EHL, SILT except L4 distribution numbness  Assesment/Plan: 50 y.o. man with recurrent far lateral disc herniation at L4-5, here for L4-5 MIS TLIF. Risks, benefits, and alternatives discussed and the patient would like to continue with surgery.  -OR today -3C post-op  Jadene Pierini, MD 01/06/21 1:45 PM

## 2021-01-07 ENCOUNTER — Encounter (HOSPITAL_COMMUNITY): Payer: Self-pay | Admitting: Neurological Surgery

## 2021-01-07 LAB — CBC WITH DIFFERENTIAL/PLATELET
Abs Immature Granulocytes: 0.1 10*3/uL — ABNORMAL HIGH (ref 0.00–0.07)
Basophils Absolute: 0 10*3/uL (ref 0.0–0.1)
Basophils Relative: 0 %
Eosinophils Absolute: 0 10*3/uL (ref 0.0–0.5)
Eosinophils Relative: 0 %
HCT: 36.6 % — ABNORMAL LOW (ref 39.0–52.0)
Hemoglobin: 11.8 g/dL — ABNORMAL LOW (ref 13.0–17.0)
Immature Granulocytes: 1 %
Lymphocytes Relative: 5 %
Lymphs Abs: 1 10*3/uL (ref 0.7–4.0)
MCH: 29.8 pg (ref 26.0–34.0)
MCHC: 32.2 g/dL (ref 30.0–36.0)
MCV: 92.4 fL (ref 80.0–100.0)
Monocytes Absolute: 1 10*3/uL (ref 0.1–1.0)
Monocytes Relative: 5 %
Neutro Abs: 17.1 10*3/uL — ABNORMAL HIGH (ref 1.7–7.7)
Neutrophils Relative %: 89 %
Platelets: 126 10*3/uL — ABNORMAL LOW (ref 150–400)
RBC: 3.96 MIL/uL — ABNORMAL LOW (ref 4.22–5.81)
RDW: 18 % — ABNORMAL HIGH (ref 11.5–15.5)
WBC: 19.2 10*3/uL — ABNORMAL HIGH (ref 4.0–10.5)
nRBC: 0 % (ref 0.0–0.2)

## 2021-01-07 MED ORDER — HYDROMORPHONE HCL 2 MG PO TABS: 2.0000 mg | ORAL_TABLET | ORAL | 0 refills | Status: AC | PRN

## 2021-01-07 MED ORDER — SODIUM CHLORIDE 0.9 % IV SOLN: Freq: Once | INTRAVENOUS | Status: AC

## 2021-01-07 NOTE — Progress Notes (Signed)
   01/07/21 0841  Assess: MEWS Score  BP (!) 76/52  Pulse Rate 96  Level of Consciousness Alert  Patient Activity (if Appropriate) In bed  Assess: MEWS Score  MEWS Temp 0  MEWS Systolic 2  MEWS Pulse 0  MEWS RR 0  MEWS LOC 0  MEWS Score 2  MEWS Score Color Yellow  Assess: if the MEWS score is Yellow or Red  Were vital signs taken at a resting state? Yes  Focused Assessment No change from prior assessment  Early Detection of Sepsis Score *See Row Information* Low  MEWS guidelines implemented *See Row Information* Yes  Treat  MEWS Interventions Escalated (See documentation below)  Pain Scale 0-10  Pain Score 4  Faces Pain Scale 0  Pain Type Surgical pain  Pain Location Back  Pain Orientation Mid  Pain Descriptors / Indicators Aching;Discomfort  Pain Frequency Constant  Pain Onset On-going  Patients Stated Pain Goal 3  Pain Intervention(s) Repositioned  Take Vital Signs  Increase Vital Sign Frequency  Yellow: Q 2hr X 2 then Q 4hr X 2, if remains yellow, continue Q 4hrs  Notify: Provider  Provider Name/Title Dr. Danielle Dess  Date Provider Notified 01/07/21  Time Provider Notified [phone]  Notification Type Call  Notification Reason Other (Comment) (Low BP)  Provider response En route  Date of Provider Response 01/07/21  Time of Provider Response 0900  Document  Progress note created (see row info) Yes

## 2021-01-07 NOTE — Progress Notes (Signed)
Pt given discharge instructions and gone over with him. He verbalized understanding. Pt checks blood pressure twice daily at home and he understands to call MD if BP is low about taking his medications. All belongings gathered to be sent home. Pt in no distress at discharge.

## 2021-01-07 NOTE — Anesthesia Postprocedure Evaluation (Signed)
Anesthesia Post Note  Patient: Jerry [name]  Procedure(s) Performed: Right Lumbar 4-5 Minimally invasive transforaminal lumbar interbody fusion (Right )     Anesthesia Post Evaluation No complications documented.  Last Vitals:  Vitals:   01/07/21 1156 01/07/21 1415  BP: 108/68 94/68  Pulse: 92 90  Resp:    Temp:    SpO2:      Last Pain:  Vitals:   01/07/21 1415  TempSrc:   PainSc: 6    Pain Goal: Patients Stated Pain Goal: 3 (01/07/21 1415)                 , COKER

## 2021-01-07 NOTE — Progress Notes (Signed)
OT Cancellation Note  Patient Details Name: Jerry [name] MRN: [id number] DOB: [birth year]   Cancelled Treatment:    Reason Eval/Treat Not Completed: Medical issues which prohibited therapy (Pt has been hypotensive this morning, getting fluids now. Will check back later today to complete evaluation.)   A  01/07/2021, 10:36 AM

## 2021-01-07 NOTE — Progress Notes (Signed)
Occupational Therapy Evaluation Patient Details Name: Jerry [name] MRN: [id number] DOB: Jun 05, 1971 Today's Date: 01/07/2021    History of Present Illness Patient is a 50 y/o male who presents s/p Minimally invasive transforaminal lumbar interbody fusion 01/06/21. Recent ICD placement 01/05/21. PMH includes STEMI s/p CABG.   Clinical Impression   Paydon is pleasant and motivated for therapies. Alecia Lemming reports being indep prior to admission, and lives with mom and girlfriend who can provide 24/7 support. He completed all bed mobility and functional tranfers with min guard due to history of low BP all morning. At rest pt BP was 95/55, and after activity was 87/50, pt remained asymptomatic. Pt was given back education sheet, and demonstrated good understanding of all precautions. Pt do not need continued OT services. Safe to d/c home with intermittent supervision for mobility and ADLs for safety.     Follow Up Recommendations  No OT follow up    Equipment Recommendations  Other (comment) (none)       Precautions / Restrictions Precautions Precautions: Back;ICD/Pacemaker Precaution Booklet Issued: Yes (comment) Precaution Comments: Reviewed back precautions, pacer precautions      Mobility Bed Mobility Overal bed mobility: Needs Assistance Bed Mobility: Rolling;Sidelying to Sit;Sit to Sidelying Rolling: Supervision Sidelying to sit: Supervision;HOB elevated     Sit to sidelying: Supervision;HOB elevated General bed mobility comments: Cues for log roll technique and use of rail for support. No dizziness.    Transfers Overall transfer level: Needs assistance Equipment used: None Transfers: Sit to/from Stand Sit to Stand: Min guard         General transfer comment: Monitored pts vitals closely, limited activity due to hypotensive BPs all day    Balance Overall balance assessment: Needs assistance Sitting-balance support: Feet supported;No upper extremity supported Sitting  balance-Leahy Scale: Good     Standing balance support: During functional activity Standing balance-Leahy Scale: Fair Standing balance comment: Close Min guard- mostly due to low BP               ADL either performed or assessed with clinical judgement   ADL Overall ADL's : Needs assistance/impaired Eating/Feeding: Independent   Grooming: Wash/dry hands;Wash/dry face;Oral care;Applying deodorant;Supervision/safety;Sitting (at sink)   Upper Body Bathing: Supervision/ safety;Cueing for compensatory techniques;Sitting   Lower Body Bathing: Supervison/ safety;Cueing for back precautions;Sitting/lateral leans   Upper Body Dressing : Supervision/safety;Sitting   Lower Body Dressing: Min guard;Cueing for back precautions;Sit to/from stand   Toilet Transfer: Min guard;Ambulation   Toileting- Clothing Manipulation and Hygiene: Min guard;Sit to/from stand       Functional mobility during ADLs:  (Supervision / min guard due to hypotensive vitals, pt remained asymtomatic) General ADL Comments: pt lower BP after activity 87/50, resting BP at 95/55. pt reports a systolic pressure in teh 90's is "normal" for him                  Pertinent Vitals/Pain Pain Location: back Pain Descriptors / Indicators: Grimacing;Guarding     Hand Dominance Right   Extremity/Trunk Assessment Upper Extremity Assessment RUE Deficits / Details: Pace maker placement, limited ROM of RUE allowed       Cervical / Trunk Assessment Cervical / Trunk Assessment: Other exceptions Personnel officer maker placement R upper chest)   Communication Communication Communication: No difficulties   Cognition Arousal/Alertness: Awake/alert Behavior During Therapy: WFL for tasks assessed/performed Overall Cognitive Status: Within Functional Limits for tasks assessed          General Comments  Sx sites clean and in tact, pt  with red face and sweating prior to session and throughout            Home Living  Family/patient expects to be discharged to:: Private residence Living Arrangements: Spouse/significant other;Parent Available Help at Discharge: Family;Available 24 hours/day Type of Home: House Home Access: Stairs to enter [employer] of Steps: 1 Entrance Stairs-Rails: None Home Layout: One level     Bathroom Shower/Tub: Tub only;Walk-in shower   Bathroom Toilet: Standard Bathroom Accessibility: Yes   Home Equipment: Crutches;Shower seat          Prior Functioning/Environment Level of Independence: Independent        Comments: Walking 1.5 miles/day. 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        OT Problem List: Decreased strength;Decreased range of motion;Decreased activity tolerance;Impaired balance (sitting and/or standing);Decreased knowledge of use of DME or AE;Decreased knowledge of precautions;Pain      OT Treatment/Interventions:      OT Goals(Current goals can be found in the care plan section) Acute Rehab OT Goals Patient Stated Goal: to go home  OT Frequency:      AM-PAC OT "6 Clicks" Daily Activity     Outcome Measure Help from another person eating meals?: None Help from another person taking care of personal grooming?: A Little Help from another person toileting, which includes using toliet, bedpan, or urinal?: A Little Help from another person bathing (including washing, rinsing, drying)?: A Little Help from another person to put on and taking off regular upper body clothing?: A Little Help from another person to put on and taking off regular lower body clothing?: A Little 6 Click Score: 19   End of Session Nurse Communication: Mobility status;Other (comment) (Vitals)  Activity Tolerance: Patient tolerated treatment well Patient left: in bed;with call bell/phone within reach;with bed alarm set  OT Visit Diagnosis: Unsteadiness on feet (R26.81);Other  abnormalities of gait and mobility (R26.89);Pain Pain - Right/Left:  (back) Pain - part of body:  (back)                Time: 4235-3614 OT Time Calculation (min): 17 min Charges:  OT General Charges $OT Visit: 1 Visit OT Evaluation $OT Eval Moderate Complexity: 1 Mod    A  01/07/2021, 3:54 PM

## 2021-01-07 NOTE — Evaluation (Signed)
Physical Therapy Evaluation Patient Details Name: Jerry [name] MRN: [id number] DOB: [birth year] Today's Date: 01/07/2021   History of Present Illness  Patient is a 50 y/o male who presents s/p Minimally invasive transforaminal lumbar interbody fusion 01/06/21. Recent ICD placement 01/05/21. PMH includes STEMI s/p CABG.  Clinical Impression  Patient presents with pain, post surgical deficits and hypotension s/p above. Pt lives at home with g/f and mother and reports being independent for ADLs/IADLs PTA. Supposed to return to work on May 1 after admission for STEMI back in October however will not be. Today, mobility limited by hypotension (70s/50s manually) however pt asymptomatic. About to get more fluids.Able to stand and step along side bed with Min guard-Min A. Education re: back precautions, log roll technique, pacer precautions, positioning etc. Will likely progress well when BP improves. Will follow acutely to maximize independence and mobility prior to return home.    Follow Up Recommendations No PT follow up;Supervision - Intermittent    Equipment Recommendations  None recommended by PT    Recommendations for Other Services       Precautions / Restrictions Precautions Precautions: Back;ICD/Pacemaker Precaution Booklet Issued: No Precaution Comments: Reviewed back precautions, pacer precautions Restrictions Weight Bearing Restrictions: No      Mobility  Bed Mobility Overal bed mobility: Needs Assistance Bed Mobility: Rolling;Sidelying to Sit;Sit to Sidelying Rolling: Supervision Sidelying to sit: Supervision;HOB elevated     Sit to sidelying: Supervision;HOB elevated General bed mobility comments: Cues for log roll technique and use of rail for support. No dizziness.    Transfers Overall transfer level: Needs assistance Equipment used: 1 person hand held assist Transfers: Sit to/from Stand Sit to Stand: Min assist         General transfer comment: ASsist to power  and steady in standing from EOB. No dizziness.  Ambulation/Gait Ambulation/Gait assistance: Min guard Gait Distance (Feet): 3 Feet         General Gait Details: Able to side step long side bed with Min guard assist; limited due to soft Bps (70s/50s manually).  Stairs            Wheelchair Mobility    Modified Rankin (Stroke Patients Only)       Balance Overall balance assessment: Needs assistance Sitting-balance support: Feet supported;No upper extremity supported Sitting balance-Leahy Scale: Good     Standing balance support: During functional activity Standing balance-Leahy Scale: Fair Standing balance comment: Close Min guard- mostly due to low BP                             Pertinent Vitals/Pain Pain Assessment: Faces Faces Pain Scale: Hurts even more Pain Location: back Pain Descriptors / Indicators: Sore;Aching Pain Intervention(s): Monitored during session;Repositioned    Home Living Family/patient expects to be discharged to:: Private residence Living Arrangements: Spouse/significant other Available Help at Discharge: Family;Available 24 hours/day (mom and Belenda Cruise (g/f)) Type of Home: House Home Access: Stairs to enter Entrance Stairs-Rails: None Entrance Stairs-Number of Steps: 1 Home Layout: One level Home Equipment: Crutches      Prior Function Level of Independence: Independent         Comments: Walking 1.5 miles/day. 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     Hand Dominance   Dominant Hand: Right    Extremity/Trunk Assessment   Upper Extremity Assessment Upper Extremity Assessment: Defer to OT evaluation    Lower  Extremity Assessment Lower Extremity Assessment: Overall WFL for tasks assessed (No numbness/tingling)    Cervical / Trunk Assessment Cervical / Trunk Assessment: Other exceptions Cervical / Trunk Exceptions: s/p back surgery   Communication   Communication: No difficulties  Cognition Arousal/Alertness: Awake/alert Behavior During Therapy: WFL for tasks assessed/performed Overall Cognitive Status: Within Functional Limits for tasks assessed                                        General Comments General comments (skin integrity, edema, etc.): Back incisions clean and intact    Exercises     Assessment/Plan    PT Assessment Patient needs continued PT services  PT Problem List Decreased skin integrity;Decreased knowledge of precautions;Pain;Decreased activity tolerance;Cardiopulmonary status limiting activity       PT Treatment Interventions Therapeutic exercise;Gait training;Stair training;Patient/family education;Therapeutic activities;Functional mobility training;Balance training    PT Goals (Current goals can be found in the Care Plan section)  Acute Rehab PT Goals Patient Stated Goal: to go home PT Goal Formulation: With patient Time For Goal Achievement: 01/21/21 Potential to Achieve Goals: Good    Frequency Min 5X/week   Barriers to discharge        Co-evaluation               AM-PAC PT "6 Clicks" Mobility  Outcome Measure Help needed turning from your back to your side while in a flat bed without using bedrails?: A Little Help needed moving from lying on your back to sitting on the side of a flat bed without using bedrails?: A Little Help needed moving to and from a bed to a chair (including a wheelchair)?: A Little Help needed standing up from a chair using your arms (e.g., wheelchair or bedside chair)?: A Little Help needed to walk in hospital room?: A Little Help needed climbing 3-5 steps with a railing? : A Little 6 Click Score: 18    End of Session   Activity Tolerance: Treatment limited secondary to medical complications (Comment) (hypotension) Patient left: in bed;with call bell/phone within reach;with bed alarm set Nurse Communication: Mobility  status PT Visit Diagnosis: Pain Pain - part of body:  (back)    Time: 3016-0109 PT Time Calculation (min) (ACUTE ONLY): 18 min   Charges:   PT Evaluation $PT Eval Moderate Complexity: 1 Mod          Vale Haven, PT, DPT Acute Rehabilitation Services Pager [phone] Office [phone]      Blake Divine A  01/07/2021, 10:19 AM

## 2021-01-11 ENCOUNTER — Other Ambulatory Visit: Payer: Self-pay | Admitting: Cardiovascular Disease

## 2021-01-11 ENCOUNTER — Other Ambulatory Visit (HOSPITAL_COMMUNITY): Payer: Self-pay | Admitting: Cardiology

## 2021-01-13 ENCOUNTER — Ambulatory Visit (INDEPENDENT_AMBULATORY_CARE_PROVIDER_SITE_OTHER): Payer: 59 | Admitting: *Deleted

## 2021-01-13 ENCOUNTER — Other Ambulatory Visit: Payer: Self-pay

## 2021-01-13 DIAGNOSIS — Z5181 Encounter for therapeutic drug level monitoring: Secondary | ICD-10-CM

## 2021-01-13 DIAGNOSIS — I824Y2 Acute embolism and thrombosis of unspecified deep veins of left proximal lower extremity: Secondary | ICD-10-CM

## 2021-01-13 DIAGNOSIS — I4891 Unspecified atrial fibrillation: Secondary | ICD-10-CM | POA: Diagnosis not present

## 2021-01-13 LAB — POCT INR: INR: 1.4 — AB (ref 2.0–3.0)

## 2021-01-13 NOTE — Patient Instructions (Addendum)
Description   Today take 1 tablet of Warfarin and tomorrow take 1.5 tablets of Warfarin then resume taking Warfarin 1 tablet daily, except for 1/2  tablet on Sunday, Tuesday and Thursday.  Recheck INR on Tuesday due to transportation issues-will have to wait to be worked in. [facility]in Clinic [phone].

## 2021-01-16 ENCOUNTER — Other Ambulatory Visit: Payer: Self-pay

## 2021-01-16 ENCOUNTER — Other Ambulatory Visit (INDEPENDENT_AMBULATORY_CARE_PROVIDER_SITE_OTHER): Payer: Self-pay

## 2021-01-18 ENCOUNTER — Ambulatory Visit (INDEPENDENT_AMBULATORY_CARE_PROVIDER_SITE_OTHER): Payer: 59 | Admitting: *Deleted

## 2021-01-18 ENCOUNTER — Ambulatory Visit (INDEPENDENT_AMBULATORY_CARE_PROVIDER_SITE_OTHER): Payer: 59 | Admitting: Emergency Medicine

## 2021-01-18 ENCOUNTER — Other Ambulatory Visit: Payer: Self-pay

## 2021-01-18 DIAGNOSIS — I4891 Unspecified atrial fibrillation: Secondary | ICD-10-CM

## 2021-01-18 DIAGNOSIS — I5022 Chronic systolic (congestive) heart failure: Secondary | ICD-10-CM | POA: Diagnosis not present

## 2021-01-18 DIAGNOSIS — I824Y2 Acute embolism and thrombosis of unspecified deep veins of left proximal lower extremity: Secondary | ICD-10-CM

## 2021-01-18 DIAGNOSIS — Z5181 Encounter for therapeutic drug level monitoring: Secondary | ICD-10-CM

## 2021-01-18 LAB — CUP PACEART INCLINIC DEVICE CHECK
Brady Statistic RV Percent Paced: 0 %
Date Time Interrogation Session: 20220426095316
HighPow Impedance: 74 Ohm
Implantable Lead Implant Date: 20220413
Implantable Lead Location: 753860
Implantable Lead Model: 183
Implantable Lead Serial Number: 303520
Implantable Pulse Generator Implant Date: 20220413
Lead Channel Impedance Value: 548 Ohm
Lead Channel Pacing Threshold Amplitude: 0.7 V
Lead Channel Pacing Threshold Pulse Width: 0.4 ms
Lead Channel Sensing Intrinsic Amplitude: 25 mV
Lead Channel Setting Pacing Amplitude: 3.5 V
Lead Channel Setting Pacing Pulse Width: 0.4 ms
Lead Channel Setting Sensing Sensitivity: 0.5 mV
Pulse Gen Serial Number: 212369

## 2021-01-18 LAB — POCT INR: INR: 1.8 — AB (ref 2.0–3.0)

## 2021-01-18 NOTE — Patient Instructions (Signed)
Description   Today take 1 tablet of Warfarin then resume taking Warfarin 1 tablet daily, except for 1/2  tablet on Sunday, Tuesday and Thursday. Recheck INR in 1 week. [facility]in Clinic [phone].

## 2021-01-18 NOTE — Progress Notes (Signed)
Wound check appointment. Steri-strips removed. Wound without redness or edema. Incision edges approximated, wound well healed. Normal device function. Thresholds, sensing, and impedances consistent with implant measurements. Device programmed at 3.5V for extra safety margin until 3 month visit. Histogram distribution appropriate for patient and level of activity. No ventricular arrhythmias noted. Patient educated about wound care, arm mobility, lifting restrictions, shock plan. Patient is enrolled in remote monitoring, next scheduled check 04/08/21.  ROV with Dr. Graciela Husbands 04/14/21.

## 2021-01-25 ENCOUNTER — Other Ambulatory Visit: Payer: Self-pay

## 2021-01-25 ENCOUNTER — Ambulatory Visit (INDEPENDENT_AMBULATORY_CARE_PROVIDER_SITE_OTHER): Payer: 59 | Admitting: *Deleted

## 2021-01-25 DIAGNOSIS — I824Y2 Acute embolism and thrombosis of unspecified deep veins of left proximal lower extremity: Secondary | ICD-10-CM | POA: Diagnosis not present

## 2021-01-25 DIAGNOSIS — Z5181 Encounter for therapeutic drug level monitoring: Secondary | ICD-10-CM

## 2021-01-25 DIAGNOSIS — I4891 Unspecified atrial fibrillation: Secondary | ICD-10-CM | POA: Diagnosis not present

## 2021-01-25 LAB — POCT INR: INR: 1.8 — AB (ref 2.0–3.0)

## 2021-01-25 NOTE — Patient Instructions (Signed)
Description   Today take 1 tablet of Warfarin then start taking Warfarin 1 tablet daily except for 1/2  tablet on Sunday and Thursday. Recheck INR in 2 weeks. Coumadin Clinic [phone].

## 2021-02-01 ENCOUNTER — Ambulatory Visit (HOSPITAL_COMMUNITY)
Admission: RE | Admit: 2021-02-01 | Discharge: 2021-02-01 | Disposition: A | Discharge status: Home / Self Care | Payer: 59 | Source: Ambulatory Visit | Attending: Cardiology | Admitting: Cardiology

## 2021-02-01 ENCOUNTER — Encounter (HOSPITAL_COMMUNITY): Payer: Self-pay | Admitting: Cardiology

## 2021-02-01 ENCOUNTER — Other Ambulatory Visit: Payer: Self-pay

## 2021-02-01 VITALS — BP 102/68 | HR 96 | Wt 155.2 lb

## 2021-02-01 DIAGNOSIS — I340 Nonrheumatic mitral (valve) insufficiency: Secondary | ICD-10-CM | POA: Insufficient documentation

## 2021-02-01 DIAGNOSIS — I5022 Chronic systolic (congestive) heart failure: Secondary | ICD-10-CM | POA: Diagnosis present

## 2021-02-01 DIAGNOSIS — Z951 Presence of aortocoronary bypass graft: Secondary | ICD-10-CM | POA: Insufficient documentation

## 2021-02-01 DIAGNOSIS — Z7984 Long term (current) use of oral hypoglycemic drugs: Secondary | ICD-10-CM | POA: Insufficient documentation

## 2021-02-01 DIAGNOSIS — I4891 Unspecified atrial fibrillation: Secondary | ICD-10-CM | POA: Insufficient documentation

## 2021-02-01 DIAGNOSIS — Z7901 Long term (current) use of anticoagulants: Secondary | ICD-10-CM | POA: Diagnosis not present

## 2021-02-01 DIAGNOSIS — I255 Ischemic cardiomyopathy: Secondary | ICD-10-CM | POA: Diagnosis not present

## 2021-02-01 DIAGNOSIS — Z79899 Other long term (current) drug therapy: Secondary | ICD-10-CM | POA: Diagnosis not present

## 2021-02-01 DIAGNOSIS — Z95810 Presence of automatic (implantable) cardiac defibrillator: Secondary | ICD-10-CM | POA: Insufficient documentation

## 2021-02-01 DIAGNOSIS — I2510 Atherosclerotic heart disease of native coronary artery without angina pectoris: Secondary | ICD-10-CM | POA: Insufficient documentation

## 2021-02-01 DIAGNOSIS — Z7982 Long term (current) use of aspirin: Secondary | ICD-10-CM | POA: Insufficient documentation

## 2021-02-01 DIAGNOSIS — I252 Old myocardial infarction: Secondary | ICD-10-CM | POA: Diagnosis not present

## 2021-02-01 LAB — BASIC METABOLIC PANEL
Anion gap: 7 (ref 5–15)
BUN: 18 mg/dL (ref 6–20)
CO2: 26 mmol/L (ref 22–32)
Calcium: 9.6 mg/dL (ref 8.9–10.3)
Chloride: 105 mmol/L (ref 98–111)
Creatinine, Ser: 0.88 mg/dL (ref 0.61–1.24)
GFR, Estimated: >60 mL/min (ref >60)
Glucose, Bld: 121 mg/dL — ABNORMAL HIGH (ref 70–99)
Potassium: 4.1 mmol/L (ref 3.5–5.1)
Sodium: 138 mmol/L (ref 135–145)

## 2021-02-01 LAB — CBC
HCT: 48.5 % (ref 39.0–52.0)
Hemoglobin: 15.5 g/dL (ref 13.0–17.0)
MCH: 29.6 pg (ref 26.0–34.0)
MCHC: 32 g/dL (ref 30.0–36.0)
MCV: 92.6 fL (ref 80.0–100.0)
Platelets: 196 10*3/uL (ref 150–400)
RBC: 5.24 MIL/uL (ref 4.22–5.81)
RDW: 16.1 % — ABNORMAL HIGH (ref 11.5–15.5)
WBC: 13.9 10*3/uL — ABNORMAL HIGH (ref 4.0–10.5)
nRBC: 0 % (ref 0.0–0.2)

## 2021-02-01 LAB — DIGOXIN LEVEL: Digoxin Level: 0.4 ng/mL — ABNORMAL LOW (ref 0.8–2.0)

## 2021-02-01 NOTE — Progress Notes (Signed)
Advanced [facility] Failure Clinic Note  PCP: Sigmund Hazel, MD HF Cardiology: Dr. Shirlee Latch  CT Surgery: Dr. Dorris Fetch   50 y.o. male s/p back surgery 05/2020 was admitted for acute inferior and anterolateral MI on 06/25/20. LHC showed thrombotic occlusion of distal RCA that was treated with PTCA and thrombectomy. He was also noted to have chronic total occlusion of the LAD with collaterals from the right (thus this territory was affected by the RCA MI) as well as 90% complex proximal LCx stenosis. EF 25-30% + apical thrombus and severe mitral regurgitation. RV normal. Also w/ post MI pericarditis, treated w/ colchicine. Course further c/b cardiogenic shock and acute hypoxic respiratory failure w/ component of septic shock 2/2 PNA, requiring intubation and treatment w/ abx. Once stabilized, he underwent CABG x 3 (LIMA-LAD, Lt radial-OM1 and sequential SVG-PDA/PLA) + MV repair and placement of Impella 5.5 on 10/13 by Dr. Dorris Fetch.   Post operatively, he developed tamponade and was taken back to the OR on 10/14 with large hematoma obstructing right atrium. He improved and was extubated 10/19.  Impella removal 10/21 w/ stable hemodynamics and able to tolerate initiation of GDMT.  Also had post-operative afib treated w/ amiodarone. He was discharged home on 07/21/20.   Echo in 3/22 showed EF 20-25% with diffuse hypokinesis, mildly decreased RV systolic function, no LV thrombus, normal IVC, s/p MV repair with trivial MR and mild stenosis (mean gradient 4 mmHg).  [employer] ICD placed in 4/22.  Later in 4/22, he had L4-L5 spinal fusion.   He presents to clinic today for followup of CHF. Here w/ his girlfriend.  He has been doing well symptomatically.  No significant exertional dyspnea, doing a lot of walking now that his back feel better.  No orthopnea/PND.  No chest pain.  No lightheadedness, but BP runs low at home at times (frequently with SBP 90s).  He plans to go back to work in August.  Weight  stable.   ECG (personally reviewed): NSR, PVCs, old inferior MI, old anterolateral MI  Labs (11/21): LDL 59, HDL 34 Labs (1/22): digoxin level 1.2 Labs (2/22): K 4.3, creatinine 1.1 Labs (4/22): K 5, creatinine 1.02, hgb 11.8  Review of systems complete and found to be negative unless listed in HPI.    PMH: 1. Anxiety 2. Hyperlipidemia 3. CAD: Inferior and anterolateral MI 10/21.  LHC showed thrombotic occlusion distal RCA treated with PTCA/thrombectomy, CTO LAD with R=>L collaterals, 90% complex proximal LCx stenosis.  - CABG x 4 in 10/21 with LIMA-LAD, left radial-OM1, sequential SVG-PDA/PLV.  4. Mitral regurgitation: Suspect infarct-related MR.   - Mitral valve repair in 10/21 with CABG.  5. LV thrombus  6. Chronic systolic CHF: ischemic cardiomyopathy.  [employer] ICD.  Echo (10/21) with EF 25-30%, apical thrombus, severe MR, normal RV.   - Echo (3/22): EF 20-25% with diffuse hypokinesis, mildly decreased RV systolic function, no LV thrombus, normal IVC, s/p MV repair with trivial MR and mild stenosis (mean gradient 4 mmHg).   7. Post-infarct pericarditis in 10/21. 8. Atrial fibrillation: Post-op in 10/21.  9. H/o back surgery 9/21: Discectomy.     Current Outpatient Medications  Medication Sig Dispense Refill  . acetaminophen (TYLENOL) 325 MG tablet Take 2 tablets (650 mg total) by mouth every 6 (six) hours as needed for mild pain.    Marland Kitchen ALPRAZolam (XANAX) 0.5 MG tablet Take 0.5 mg by mouth at bedtime.    Marland Kitchen aspirin EC 81 MG EC tablet Take 1 tablet (81 mg total)  by mouth daily. Swallow whole. 30 tablet 11  . atorvastatin (LIPITOR) 80 MG tablet Take 1 tablet (80 mg total) by mouth daily. 30 tablet 6  . carvedilol (COREG) 6.25 MG tablet Take 1 tablet (6.25 mg total) by mouth 2 (two) times daily with a meal. 60 tablet 11  . digoxin (LANOXIN) 0.125 MG tablet TAKE 1/2 TABLET(0.063 MG) BY MOUTH DAILY 15 tablet 2  . empagliflozin (JARDIANCE) 10 MG TABS tablet Take 1 tablet (10 mg  total) by mouth daily before breakfast. 30 tablet 11  . Multiple Vitamin (MULTIVITAMIN WITH MINERALS) TABS tablet Take 1 tablet by mouth in the morning.    . sacubitril-valsartan (ENTRESTO) 49-51 MG Take 1 tablet by mouth 2 (two) times daily. 60 tablet 6  . spironolactone (ALDACTONE) 25 MG tablet Take 1 tablet (25 mg total) by mouth at bedtime. 90 tablet 3  . traZODone (DESYREL) 100 MG tablet Take 100 mg by mouth at bedtime.    Marland Kitchen warfarin (COUMADIN) 2.5 MG tablet Take 1 tablet daily except 1/2 tablet on Sunday, Tuesday, Thursday or as directed by Anticoagulation Clinic. 30 tablet 2   No current facility-administered medications for this encounter.    No Known Allergies    Social History   Socioeconomic History  . Marital status: Single    Spouse name: Not on file  . Number of children: Not on file  . Years of education: Not on file  . Highest education level: Not on file  Occupational History    Employer: [employer]  Tobacco Use  . Smoking status: Never Smoker  . Smokeless tobacco: Never Used  Vaping Use  . Vaping Use: Never used  Substance and Sexual Activity  . Alcohol use: Yes    Alcohol/week: 1.0 standard drink    Types: 1 Standard drinks or equivalent per week  . Drug use: No  . Sexual activity: Yes    Birth control/protection: None  Other Topics Concern  . Not on file  Social History Narrative  . Not on file   Social Determinants of Health   Financial Resource Strain: Not on file  Food Insecurity: Not on file  Transportation Needs: Not on file  Physical Activity: Not on file  Stress: Not on file  Social Connections: Not on file  Intimate Partner Violence: Not on file    FH: No family history of premature CAD.   Vitals:   02/01/21 0900  BP: 102/68  Pulse: 96  SpO2: 97%  Weight: 70.4 kg (155 lb 3.2 oz)    PHYSICAL EXAM: General: NAD Neck: No JVD, no thyromegaly or thyroid nodule.  Lungs: Clear to auscultation bilaterally with normal  respiratory effort. CV: Nondisplaced PMI.  Heart regular S1/S2, no S3/S4, no murmur.  No peripheral edema.  No carotid bruit.  Normal pedal pulses.  Abdomen: Soft, nontender, no hepatosplenomegaly, no distention.  Skin: Intact without lesions or rashes.  Neurologic: Alert and oriented x 3.  Psych: Normal affect. Extremities: No clubbing or cyanosis.  HEENT: Normal.   ASSESSMENT & PLAN:  1. CAD: Late presentation inferior and anterolateral MI 10/21. Cath showed thrombotic occlusion distal RCA, CTO LAD with collaterals from right, and complex 90% proximal LCx stenosis. He had PTCA/thrombectomy RCA with good flow at end of procedure. Suspect he had damage to LAD territory as well as RCA territory as RCA collateralized the LAD. S/p CABG w/ impella support with LIMA-LAD, left radial to OM, seq SVG-PDA/PLV. Post op course c/b tamponade and was taken back to  the OR on 10/14 for drainage, found to have large hematoma obstructing right atrium.  No chest pain.  - Continue ASA 81 daily.  - Continue atorvastatin 80 mg daily. Good lipids in 11/21.  2. 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 Echo in 10/21 with EF 25-30%.  Echo in 3/22 showed EF 20-25%, diffuse hypokinesis, mildly decreased RV systolic function.  He now has a Environmental manager ICD.  He is not volume overloaded on exam.  NYHA Class II symptoms. SBP 90s frequently but not symptomatic.  - Continue Entresto 49-51 mg bid, no BP room to increase.  - Continue Spironolactone 25 mg daily.  With soft BP, takes this med qhs.  BMET today.  - Continue Coreg 6.25 mg bid, no BP room to increase.  - Continue Digoxin. Check level today.  - Continue Jardiance 10 mg daily.  - Narrow QRS so not CRT candidate.    3. Severe MR: Suspect infarct-related MR. s/p MV repair at time of CABG 10/21.  Echo in 3/22 showed trivial MR, mild mitral stenosis with mean gradient 4 mmHg.  4. LV thrombus: Not present on 3/22  echo.  - Continue warfarin.  5. Post-operative atrial fibrillation: He is in NSR today. He is on warfarin, off amiodarone.  6. Post-MI pericarditis: Now off colchicine.   Followup in 3 months with APP.   Marca Ancona, MD 02/01/21

## 2021-02-01 NOTE — Patient Instructions (Signed)
EKG done today.   Labs done today. We will contact you only if your labs are abnormal.  No medication changes were made. Please continue all current medications as prescribed.  Your physician recommends that you schedule a follow-up appointment in:  3 months with our [facility] here in our office.    If you have any questions or concerns before your next appointment please send [place] a message through [place] or call our office at [phone].    TO LEAVE A MESSAGE FOR THE NURSE SELECT OPTION 2, PLEASE LEAVE A MESSAGE INCLUDING: . YOUR NAME . DATE OF BIRTH . CALL BACK NUMBER . REASON FOR CALL**this is important as we prioritize the call backs  YOU WILL RECEIVE A CALL BACK THE SAME DAY AS LONG AS YOU CALL BEFORE 4:00 PM   Do the following things EVERYDAY: 1) Weigh yourself in the morning before breakfast. Write it down and keep it in a log. 2) Take your medicines as prescribed 3) Eat low salt foods--Limit salt (sodium) to 2000 mg per day.  4) Stay as active as you can everyday 5) Limit all fluids for the day to less than 2 liters   At the [facility], you and your health needs are our priority. As part of our continuing mission to provide you with exceptional heart care, we have created designated Provider Care Teams. These Care Teams include your primary Cardiologist (physician) and Advanced Practice Providers (APPs- Physician Assistants and Nurse Practitioners) who all work together to provide you with the care you need, when you need it.   You may see any of the following providers on your designated Care Team at your next follow up: Marland Kitchen Dr Arvilla Meres . Dr Marca Ancona . Tonye Becket, NP . Robbie Lis, PA . Karle Plumber, PharmD   Please be sure to bring in all your medications bottles to every appointment.

## 2021-02-16 ENCOUNTER — Other Ambulatory Visit: Payer: Self-pay

## 2021-02-16 ENCOUNTER — Ambulatory Visit (INDEPENDENT_AMBULATORY_CARE_PROVIDER_SITE_OTHER): Payer: 59 | Admitting: *Deleted

## 2021-02-16 DIAGNOSIS — Z5181 Encounter for therapeutic drug level monitoring: Secondary | ICD-10-CM | POA: Diagnosis not present

## 2021-02-16 DIAGNOSIS — I4891 Unspecified atrial fibrillation: Secondary | ICD-10-CM | POA: Diagnosis not present

## 2021-02-16 DIAGNOSIS — I824Y2 Acute embolism and thrombosis of unspecified deep veins of left proximal lower extremity: Secondary | ICD-10-CM | POA: Diagnosis not present

## 2021-02-16 LAB — POCT INR: INR: 2.3 (ref 2.0–3.0)

## 2021-02-16 NOTE — Patient Instructions (Signed)
Description   Continue taking Warfarin 1 tablet daily except for 1/2 tablet on Sunday and Thursday. Recheck INR in 4 weeks. Coumadin Clinic [phone].

## 2021-03-16 ENCOUNTER — Other Ambulatory Visit: Payer: Self-pay | Admitting: Internal Medicine

## 2021-03-17 ENCOUNTER — Ambulatory Visit (INDEPENDENT_AMBULATORY_CARE_PROVIDER_SITE_OTHER): Payer: 59 | Admitting: Pharmacist

## 2021-03-17 ENCOUNTER — Other Ambulatory Visit: Payer: Self-pay

## 2021-03-17 DIAGNOSIS — I4891 Unspecified atrial fibrillation: Secondary | ICD-10-CM

## 2021-03-17 DIAGNOSIS — I824Y2 Acute embolism and thrombosis of unspecified deep veins of left proximal lower extremity: Secondary | ICD-10-CM

## 2021-03-17 DIAGNOSIS — Z5181 Encounter for therapeutic drug level monitoring: Secondary | ICD-10-CM | POA: Diagnosis not present

## 2021-03-17 LAB — POCT INR: INR: 2.1 (ref 2.0–3.0)

## 2021-03-17 NOTE — Patient Instructions (Signed)
Description   Continue taking Warfarin 1 tablet daily except for 1/2 tablet on Sunday and Thursday. Recheck INR in 5 weeks. Coumadin Clinic [phone].

## 2021-03-25 ENCOUNTER — Other Ambulatory Visit (HOSPITAL_COMMUNITY): Payer: Self-pay | Admitting: Cardiology

## 2021-04-08 ENCOUNTER — Ambulatory Visit (INDEPENDENT_AMBULATORY_CARE_PROVIDER_SITE_OTHER): Payer: 59

## 2021-04-08 DIAGNOSIS — I4891 Unspecified atrial fibrillation: Secondary | ICD-10-CM | POA: Diagnosis not present

## 2021-04-11 ENCOUNTER — Other Ambulatory Visit (HOSPITAL_COMMUNITY): Payer: Self-pay | Admitting: Cardiology

## 2021-04-11 LAB — CUP PACEART REMOTE DEVICE CHECK
Battery Remaining Longevity: 180 mo
Battery Remaining Percentage: 100 %
Brady Statistic RV Percent Paced: 0 %
Date Time Interrogation Session: 20220718101800
HighPow Impedance: 61 Ohm
Implantable Lead Implant Date: 20220413
Implantable Lead Location: 753860
Implantable Lead Model: 183
Implantable Lead Serial Number: 303520
Implantable Pulse Generator Implant Date: 20220413
Lead Channel Impedance Value: 462 Ohm
Lead Channel Setting Pacing Amplitude: 3.5 V
Lead Channel Setting Pacing Pulse Width: 0.4 ms
Lead Channel Setting Sensing Sensitivity: 0.5 mV
Pulse Gen Serial Number: 212369

## 2021-04-12 ENCOUNTER — Encounter: Payer: 59 | Admitting: Thoracic Surgery (Cardiothoracic Vascular Surgery)

## 2021-04-14 ENCOUNTER — Encounter: Payer: 59 | Admitting: Internal Medicine

## 2021-04-17 DIAGNOSIS — I255 Ischemic cardiomyopathy: Secondary | ICD-10-CM | POA: Insufficient documentation

## 2021-04-18 ENCOUNTER — Ambulatory Visit: Payer: 59 | Admitting: Internal Medicine

## 2021-04-18 ENCOUNTER — Other Ambulatory Visit: Payer: Self-pay

## 2021-04-18 ENCOUNTER — Encounter: Payer: Self-pay | Admitting: Internal Medicine

## 2021-04-18 DIAGNOSIS — I255 Ischemic cardiomyopathy: Secondary | ICD-10-CM

## 2021-04-18 NOTE — Progress Notes (Signed)
Patient Care Team: Sigmund Hazel, MD as PCP - General (Family Medicine) Tonny Bollman, MD as PCP - Cardiology (Cardiology)   HPI  Jerry [name] is a 50 y.o. male seen in followup for ICD implanted 4/22 for primary prevention for ICM, prior impella supported  CABG with concomitant Mitral valve disease with ring; apical thrombus  The patient denies chest pain, shortness of breath, nocturnal dyspnea, orthopnea or peripheral edema.  There have been no palpitations, lightheadedness or syncope.    Biggest complaint is back pain following surgery  DATE TEST EF    10/21 LHC 25-30 % LAD-T (R>>L); CXp-90% RCAd-T>>POBA  10/21 Echo   * % MR mod  3/*22 Echo  20-25%      Date Cr K Dig Hgb  3/22 1.12 4.8 0.5 16.3                 Records and Results Reviewed  Past Medical History:  Diagnosis Date   AICD (automatic cardioverter/defibrillator) present 01/05/2021   [employer]   Anxiety    Coronary artery disease    Essential (primary) hypertension 06/17/2020   Myocardial infarction (HCC)    Radiculopathy, lumbar region 11/14/2019    Past Surgical History:  Procedure Laterality Date   BACK SURGERY     CARDIAC CATHETERIZATION     CORONARY ANGIOPLASTY     CORONARY ARTERY BYPASS GRAFT N/A 07/07/2020   Procedure: CORONARY ARTERY BYPASS GRAFTING (CABG) x FOUR, USING LEFT RADIAL ARTERY AND RIGHT LEG GREATER SAPHENOUS VEIN HARVESTED ENDOSCOPICALLY;  Surgeon: Loreli Slot, MD;  Location: [facility] OR;  Service: Open Heart Surgery;  Laterality: N/A;   CORONARY BALLOON ANGIOPLASTY N/A 06/25/2020   Procedure: CORONARY BALLOON ANGIOPLASTY;  Surgeon: Tonny Bollman, MD;  Location: [facility] INVASIVE CV LAB;  Service: Cardiovascular;  Laterality: N/A;   CORONARY THROMBECTOMY N/A 06/25/2020   Procedure: Coronary Thrombectomy;  Surgeon: Tonny Bollman, MD;  Location: [facility] INVASIVE CV LAB;  Service: Cardiovascular;  Laterality: N/A;   CORONARY/GRAFT ACUTE MI REVASCULARIZATION N/A 06/26/2020    Procedure: Coronary/Graft Acute MI Revascularization;  Surgeon: Tonny Bollman, MD;  Location: [facility] INVASIVE CV LAB;  Service: Cardiovascular;  Laterality: N/A;   ENDOVEIN HARVEST OF GREATER SAPHENOUS VEIN Right 07/07/2020   Procedure: ENDOVEIN HARVEST OF GREATER SAPHENOUS VEIN;  Surgeon: Loreli Slot, MD;  Location: [facility] OR;  Service: Open Heart Surgery;  Laterality: Right;   EXPLORATION POST OPERATIVE OPEN HEART N/A 07/08/2020   Procedure: EXPLORATION POST OPERATIVE OPEN HEART TAMPONADE;  Surgeon: Loreli Slot, MD;  Location: [facility] OR;  Service: Open Heart Surgery;  Laterality: N/A;   FRACTURE SURGERY     ICD IMPLANT N/A 01/05/2021   Procedure: ICD IMPLANT;  Surgeon: Duke Salvia, MD;  Location: [facility] INVASIVE CV LAB;  Service: Cardiovascular;  Laterality: N/A;   LEFT HEART CATH AND CORONARY ANGIOGRAPHY N/A 06/25/2020   Procedure: LEFT HEART CATH AND CORONARY ANGIOGRAPHY;  Surgeon: Tonny Bollman, MD;  Location: [facility] INVASIVE CV LAB;  Service: Cardiovascular;  Laterality: N/A;   PLACEMENT OF IMPELLA LEFT VENTRICULAR ASSIST DEVICE Right 07/07/2020   Procedure: PLACEMENT OF IMPELLA LEFT VENTRICULAR ASSIST DEVICE USING ABIOMED IMPELLA 5.5;  Surgeon: Loreli Slot, MD;  Location: [facility] OR;  Service: Open Heart Surgery;  Laterality: Right;   PLACEMENT OF IMPELLA LEFT VENTRICULAR ASSIST DEVICE N/A 07/15/2020   Procedure: REMOVAL OF IMPELLA LEFT VENTRICULAR ASSIST DEVICE;  Surgeon: Loreli Slot, MD;  Location: [facility] OR;  Service: Open Heart Surgery;  Laterality: N/A;  RADIAL ARTERY HARVEST Left 07/07/2020   Procedure: RADIAL ARTERY HARVEST left;  Surgeon: Loreli Slot, MD;  Location: [facility] OR;  Service: Open Heart Surgery;  Laterality: Left;   right clavicle fracture reapir  2010   RIGHT HEART CATH N/A 06/26/2020   Procedure: RIGHT HEART CATH;  Surgeon: Tonny Bollman, MD;  Location: [facility] INVASIVE CV LAB;  Service: Cardiovascular;  Laterality: N/A;   TEE WITHOUT CARDIOVERSION  N/A 07/07/2020   Procedure: TRANSESOPHAGEAL ECHOCARDIOGRAM (TEE);  Surgeon: Loreli Slot, MD;  Location: [facility] OR;  Service: Open Heart Surgery;  Laterality: N/A;   TRANSFORAMINAL LUMBAR INTERBODY FUSION W/ MIS 1 LEVEL Right 01/06/2021   Procedure: Right Lumbar 4-5 Minimally invasive transforaminal lumbar interbody fusion;  Surgeon: Jadene Pierini, MD;  Location: [facility] OR;  Service: Neurosurgery;  Laterality: Right;  3C/RM 19    Current Meds  Medication Sig   acetaminophen (TYLENOL) 325 MG tablet Take 2 tablets (650 mg total) by mouth every 6 (six) hours as needed for mild pain.   ALPRAZolam (XANAX) 0.5 MG tablet Take 0.5 mg by mouth at bedtime.   aspirin EC 81 MG EC tablet Take 1 tablet (81 mg total) by mouth daily. Swallow whole.   atorvastatin (LIPITOR) 80 MG tablet Take 1 tablet (80 mg total) by mouth daily.   carvedilol (COREG) 6.25 MG tablet Take 1 tablet (6.25 mg total) by mouth 2 (two) times daily with a meal.   digoxin (LANOXIN) 0.125 MG tablet TAKE 1/2 TABLET BY MOUTH DAILY   empagliflozin (JARDIANCE) 10 MG TABS tablet Take 1 tablet (10 mg total) by mouth daily before breakfast.   ENTRESTO 49-51 MG TAKE 1 TABLET BY MOUTH TWICE DAILY   Multiple Vitamin (MULTIVITAMIN WITH MINERALS) TABS tablet Take 1 tablet by mouth in the morning.   spironolactone (ALDACTONE) 25 MG tablet Take 1 tablet (25 mg total) by mouth at bedtime.   traZODone (DESYREL) 100 MG tablet Take 100 mg by mouth at bedtime.   warfarin (COUMADIN) 2.5 MG tablet TAKE 1 TABLET BY MOUTH DAILY. EXCEPT 1/2 TABLET ON SUNDAY, TUESDAY, THURSDAY OR AS DIRECTED BY COAGULATION CLINIC    No Known Allergies    Review of Systems negative except from HPI and PMH  Physical Exam BP 110/60 (BP Location: Left Arm, Patient Position: Sitting, Cuff Size: Normal)   Pulse 80   Ht 5\' 8"  (1.727 m)   Wt 160 lb (72.6 kg)   SpO2 96%   BMI 24.33 kg/m  Well developed and well nourished in no acute distress HENT normal E scleral and  icterus clear Neck Supple JVP flat; carotids brisk and full Clear to ausculation  Regular rate and rhythm, no murmurs gallops or rub Device pocket well healed; without hematoma or erythema.  There is no tethering Soft with active bowel sounds No clubbing cyanosis  Edema Alert and oriented, grossly normal motor and sensory function Skin Warm and Dry  ECG sinus 80 20/08/33 +  CrCl cannot be calculated (Patient's most recent lab result is older than the maximum 21 days allowed.).   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  No ischemic symptoms.  Continue his aspirin and Lipitor carvedilol.  Continue Aldactone 25 and Entresto 49/51 for his cardiomyopathy.  He is also on Jardiance.  Discussed with Dr. DM about discontinuing his digoxin  Current medicines are reviewed at length with the patient today .  The patient does not  have concerns regarding medicines.

## 2021-04-18 NOTE — Patient Instructions (Signed)
Medication Instructions:  Your physician recommends that you continue on your current medications as directed. Please refer to the Current Medication list given to you today.  *If you need a refill on your cardiac medications before your next appointment, please call your pharmacy*   Lab Work: None ordered.  If you have labs (blood work) drawn today and your tests are completely normal, you will receive your results only by: . MyChart Message (if you have MyChart) OR . A paper copy in the mail If you have any lab test that is abnormal or we need to change your treatment, we will call you to review the results.   Testing/Procedures: None ordered.    Follow-Up: At [facility], you and your health needs are our priority.  As part of our continuing mission to provide you with exceptional heart care, we have created designated Provider Care Teams.  These Care Teams include your primary Cardiologist (physician) and Advanced Practice Providers (APPs -  Physician Assistants and Nurse Practitioners) who all work together to provide you with the care you need, when you need it.  We recommend signing up for the patient portal called "MyChart".  Sign up information is provided on this After Visit Summary.  MyChart is used to connect with patients for Virtual Visits (Telemedicine).  Patients are able to view lab/test results, encounter notes, upcoming appointments, etc.  Non-urgent messages can be sent to your provider as well.   To learn more about what you can do with MyChart, go to [email or web address].    Your next appointment:   9 months  The format for your next appointment:   In Person  Provider:   Steven Klein, MD     

## 2021-04-21 ENCOUNTER — Ambulatory Visit (INDEPENDENT_AMBULATORY_CARE_PROVIDER_SITE_OTHER): Payer: 59

## 2021-04-21 ENCOUNTER — Other Ambulatory Visit: Payer: Self-pay

## 2021-04-21 DIAGNOSIS — I824Y2 Acute embolism and thrombosis of unspecified deep veins of left proximal lower extremity: Secondary | ICD-10-CM

## 2021-04-21 DIAGNOSIS — I4891 Unspecified atrial fibrillation: Secondary | ICD-10-CM

## 2021-04-21 DIAGNOSIS — Z5181 Encounter for therapeutic drug level monitoring: Secondary | ICD-10-CM

## 2021-04-21 LAB — POCT INR: INR: 3.9 — AB (ref 2.0–3.0)

## 2021-04-21 NOTE — Patient Instructions (Signed)
Description   Hold Warfarin tomorrow and then continue taking Warfarin 1 tablet daily except for 1/2 tablet on Sunday and Thursday. Recheck INR in 3 weeks. Coumadin Clinic [phone].

## 2021-05-02 NOTE — Progress Notes (Signed)
Remote ICD transmission.   

## 2021-05-10 ENCOUNTER — Encounter (HOSPITAL_COMMUNITY): Payer: 59 | Admitting: Cardiology

## 2021-05-12 ENCOUNTER — Other Ambulatory Visit: Payer: Self-pay

## 2021-05-12 ENCOUNTER — Ambulatory Visit (INDEPENDENT_AMBULATORY_CARE_PROVIDER_SITE_OTHER): Payer: 59 | Admitting: *Deleted

## 2021-05-12 DIAGNOSIS — I4891 Unspecified atrial fibrillation: Secondary | ICD-10-CM

## 2021-05-12 DIAGNOSIS — I824Y2 Acute embolism and thrombosis of unspecified deep veins of left proximal lower extremity: Secondary | ICD-10-CM

## 2021-05-12 DIAGNOSIS — Z5181 Encounter for therapeutic drug level monitoring: Secondary | ICD-10-CM

## 2021-05-12 LAB — POCT INR: INR: 3.2 — AB (ref 2.0–3.0)

## 2021-05-12 NOTE — Patient Instructions (Signed)
Description   Take 1/2 a tablet of warfarin tomorrow. Then start taking warfarin 1 tablet daily excpet for 1/2 a tablet on Sunday, Tuesday and Thursday. Recheck INR in 3 weeks. Coumadin Clinic [phone]

## 2021-05-16 ENCOUNTER — Other Ambulatory Visit: Payer: Self-pay | Admitting: Thoracic Surgery (Cardiothoracic Vascular Surgery)

## 2021-05-16 DIAGNOSIS — Z951 Presence of aortocoronary bypass graft: Secondary | ICD-10-CM

## 2021-05-17 ENCOUNTER — Other Ambulatory Visit: Payer: Self-pay

## 2021-05-17 ENCOUNTER — Ambulatory Visit: Payer: 59 | Admitting: Thoracic Surgery (Cardiothoracic Vascular Surgery)

## 2021-05-17 ENCOUNTER — Ambulatory Visit
Admission: RE | Admit: 2021-05-17 | Discharge: 2021-05-17 | Disposition: A | Discharge status: Home / Self Care | Payer: 59 | Source: Ambulatory Visit | Attending: Thoracic Surgery (Cardiothoracic Vascular Surgery) | Admitting: Thoracic Surgery (Cardiothoracic Vascular Surgery)

## 2021-05-17 VITALS — BP 110/80 | HR 92 | Resp 20 | Ht 68.0 in | Wt 162.0 lb

## 2021-05-17 DIAGNOSIS — Z951 Presence of aortocoronary bypass graft: Secondary | ICD-10-CM

## 2021-05-17 NOTE — Progress Notes (Signed)
[address]       Jerry [name] [address]             [phone]      HPI: Mr. Jerry [name] returns for a scheduled follow-up visit after coronary bypass grafting  Jerry [name] is a 50 year old man who presented in October 2021 with an ST elevation MI.  He had an acute occlusion of his RCA in the setting of a chronic total occlusion of the LAD.  He developed acute pulmonary edema and severe pericarditis.  I did coronary bypass grafting and mitral repair on 07/07/2020.  We did place an Impella at the time of surgery.  He went back to the operating room for tamponade the following day.  We remove the Impella on day 8 and he went home on day 14.  I saw him back in November and he was doing well.  He did have an elevated left hemidiaphragm.  I saw him back again in January and the diaphragm was still elevated but he was tolerating that well and was not symptomatic from it.  In the interim since his last visit he has had spinal fusion.  He also had an ICD placed back in April.  He feels well.  He is not having any chest pain or shortness of breath.  His exercise tolerance is good.  He has returned to work.  Past Medical History:  Diagnosis Date   AICD (automatic cardioverter/defibrillator) present 01/05/2021   [employer]   Anxiety    Coronary artery disease    Essential (primary) hypertension 06/17/2020   Myocardial infarction [facility])    Radiculopathy, lumbar region 11/14/2019    Current Outpatient Medications  Medication Sig Dispense Refill   acetaminophen (TYLENOL) 325 MG tablet Take 2 tablets (650 mg total) by mouth every 6 (six) hours as needed for mild pain.     ALPRAZolam (XANAX) 0.5 MG tablet Take 0.5 mg by mouth at bedtime.     aspirin EC 81 MG EC tablet Take 1 tablet (81 mg total) by mouth daily. Swallow whole. 30 tablet 11   atorvastatin (LIPITOR) 80 MG tablet Take 1 tablet (80 mg total) by mouth daily. 30 tablet 6   carvedilol (COREG) 6.25 MG tablet Take 1  tablet (6.25 mg total) by mouth 2 (two) times daily with a meal. 60 tablet 11   digoxin (LANOXIN) 0.125 MG tablet TAKE 1/2 TABLET BY MOUTH DAILY 15 tablet 2   empagliflozin (JARDIANCE) 10 MG TABS tablet Take 1 tablet (10 mg total) by mouth daily before breakfast. 30 tablet 11   ENTRESTO 49-51 MG TAKE 1 TABLET BY MOUTH TWICE DAILY 60 tablet 6   Multiple Vitamin (MULTIVITAMIN WITH MINERALS) TABS tablet Take 1 tablet by mouth in the morning.     spironolactone (ALDACTONE) 25 MG tablet Take 1 tablet (25 mg total) by mouth at bedtime. 90 tablet 3   traZODone (DESYREL) 100 MG tablet Take 100 mg by mouth at bedtime.     warfarin (COUMADIN) 2.5 MG tablet TAKE 1 TABLET BY MOUTH DAILY. EXCEPT 1/2 TABLET ON SUNDAY, TUESDAY, THURSDAY OR AS DIRECTED BY COAGULATION CLINIC 30 tablet 2   No current facility-administered medications for this visit.    Physical Exam BP 110/80 (BP Location: Right Arm, Patient Position: Sitting)   Pulse 92   Resp 20   Ht 5\' 8"  (1.727 m)   Wt 162 lb (73.5 kg)   SpO2 94% Comment: RA  BMI 24.63 kg/m  50 year old man in no acute distress Alert oriented x3 with no focal deficits Lungs diminished at left base otherwise clear Cardiac regular rate and rhythm No peripheral edema  Diagnostic Tests: I reviewed his chest x-ray images.  There is still some elevation of the left hemidiaphragm although I think it may be better than the film from January.  Impression: Jerry [name] is a 50 year old man who presented in October 2021 with an ST elevation MI complicated by pulmonary edema and pericarditis.  He underwent coronary bypass grafting and mitral valve repair and Impella placement in October 2021.  He went home on postop day 14.  CAD-no anginal symptoms.  Exercise tolerance is good.  Did have persistent low EF at 25%.  ICD placed in April.  Elevated left hemidiaphragm-appears to have improved slightly from his last chest x-ray about 6 months ago.  He is asymptomatic from this  and no further follow-up is necessary.  Plan: Follow-up with cardiology I will be happy to see Jerry [name] back anytime in the future if I can be of any further assistance with his care  Loreli Slot, MD [facility] [phone]

## 2021-06-02 ENCOUNTER — Other Ambulatory Visit: Payer: Self-pay

## 2021-06-02 ENCOUNTER — Ambulatory Visit (INDEPENDENT_AMBULATORY_CARE_PROVIDER_SITE_OTHER): Payer: 59

## 2021-06-02 DIAGNOSIS — Z5181 Encounter for therapeutic drug level monitoring: Secondary | ICD-10-CM

## 2021-06-02 DIAGNOSIS — I824Y2 Acute embolism and thrombosis of unspecified deep veins of left proximal lower extremity: Secondary | ICD-10-CM | POA: Diagnosis not present

## 2021-06-02 DIAGNOSIS — I4891 Unspecified atrial fibrillation: Secondary | ICD-10-CM

## 2021-06-02 LAB — POCT INR: INR: 2.3 (ref 2.0–3.0)

## 2021-06-02 NOTE — Patient Instructions (Signed)
Description   Continue on same dosage of warfarin 1 tablet daily excpet for 1/2 a tablet on Sundays, Tuesdays and Thursdays. Recheck INR in 4 weeks. Coumadin Clinic [phone]

## 2021-06-20 ENCOUNTER — Ambulatory Visit (HOSPITAL_COMMUNITY)
Admission: RE | Admit: 2021-06-20 | Discharge: 2021-06-20 | Disposition: A | Discharge status: Home / Self Care | Payer: 59 | Source: Ambulatory Visit | Attending: Cardiology | Admitting: Cardiology

## 2021-06-20 ENCOUNTER — Other Ambulatory Visit: Payer: Self-pay

## 2021-06-20 ENCOUNTER — Encounter (HOSPITAL_COMMUNITY): Payer: Self-pay | Admitting: Cardiology

## 2021-06-20 VITALS — BP 122/80 | HR 79 | Wt 172.4 lb

## 2021-06-20 DIAGNOSIS — I252 Old myocardial infarction: Secondary | ICD-10-CM | POA: Diagnosis not present

## 2021-06-20 DIAGNOSIS — I2510 Atherosclerotic heart disease of native coronary artery without angina pectoris: Secondary | ICD-10-CM | POA: Insufficient documentation

## 2021-06-20 DIAGNOSIS — Z7901 Long term (current) use of anticoagulants: Secondary | ICD-10-CM | POA: Insufficient documentation

## 2021-06-20 DIAGNOSIS — Z7984 Long term (current) use of oral hypoglycemic drugs: Secondary | ICD-10-CM | POA: Insufficient documentation

## 2021-06-20 DIAGNOSIS — Z95810 Presence of automatic (implantable) cardiac defibrillator: Secondary | ICD-10-CM | POA: Insufficient documentation

## 2021-06-20 DIAGNOSIS — I9789 Other postprocedural complications and disorders of the circulatory system, not elsewhere classified: Secondary | ICD-10-CM | POA: Diagnosis not present

## 2021-06-20 DIAGNOSIS — I5022 Chronic systolic (congestive) heart failure: Secondary | ICD-10-CM | POA: Diagnosis not present

## 2021-06-20 DIAGNOSIS — I340 Nonrheumatic mitral (valve) insufficiency: Secondary | ICD-10-CM | POA: Insufficient documentation

## 2021-06-20 DIAGNOSIS — Z951 Presence of aortocoronary bypass graft: Secondary | ICD-10-CM | POA: Diagnosis not present

## 2021-06-20 DIAGNOSIS — Z7982 Long term (current) use of aspirin: Secondary | ICD-10-CM | POA: Diagnosis not present

## 2021-06-20 DIAGNOSIS — I4891 Unspecified atrial fibrillation: Secondary | ICD-10-CM | POA: Insufficient documentation

## 2021-06-20 DIAGNOSIS — Z79899 Other long term (current) drug therapy: Secondary | ICD-10-CM | POA: Diagnosis not present

## 2021-06-20 DIAGNOSIS — I2582 Chronic total occlusion of coronary artery: Secondary | ICD-10-CM | POA: Insufficient documentation

## 2021-06-20 LAB — BASIC METABOLIC PANEL
Anion gap: 8 (ref 5–15)
BUN: 19 mg/dL (ref 6–20)
CO2: 25 mmol/L (ref 22–32)
Calcium: 9.2 mg/dL (ref 8.9–10.3)
Chloride: 104 mmol/L (ref 98–111)
Creatinine, Ser: 0.89 mg/dL (ref 0.61–1.24)
GFR, Estimated: >60 mL/min (ref >60)
Glucose, Bld: 99 mg/dL (ref 70–99)
Potassium: 4.4 mmol/L (ref 3.5–5.1)
Sodium: 137 mmol/L (ref 135–145)

## 2021-06-20 LAB — LIPID PANEL
Cholesterol: 125 mg/dL (ref 0–200)
HDL: 28 mg/dL — ABNORMAL LOW (ref >40)
LDL Cholesterol: 34 mg/dL (ref 0–99)
Total CHOL/HDL Ratio: 4.5 RATIO
Triglycerides: 315 mg/dL — ABNORMAL HIGH (ref <150)
VLDL: 63 mg/dL — ABNORMAL HIGH (ref 0–40)

## 2021-06-20 LAB — DIGOXIN LEVEL: Digoxin Level: 0.3 ng/mL — ABNORMAL LOW (ref 0.8–2.0)

## 2021-06-20 LAB — BRAIN NATRIURETIC PEPTIDE: B Natriuretic Peptide: 286.9 pg/mL — ABNORMAL HIGH (ref 0.0–100.0)

## 2021-06-20 MED ORDER — ENTRESTO 97-103 MG PO TABS: 1.0000 | ORAL_TABLET | Freq: Two times a day (BID) | ORAL | 11 refills | Status: DC

## 2021-06-20 MED ORDER — FUROSEMIDE 20 MG PO TABS: 20.0000 mg | ORAL_TABLET | Freq: Every day | ORAL | 11 refills | Status: DC

## 2021-06-20 MED ORDER — ATORVASTATIN CALCIUM 80 MG PO TABS: 80.0000 mg | ORAL_TABLET | Freq: Every day | ORAL | 3 refills | Status: DC

## 2021-06-20 NOTE — Patient Instructions (Signed)
Labs done today. We will contact you only if your labs are abnormal.  START Lasix 20mg  (1 tablet) by mouth daily.  INCREASE Entresto to 97-103mg  (1 tablet) by mouth 2 times daily.   No other medication changes were made. Please continue all current medications as prescribed.  Your physician has recommended that you have a cardiopulmonary stress test (CPX). CPX testing is a non-invasive measurement of heart and lung function. It replaces a traditional treadmill stress test. This type of test provides a tremendous amount of information that relates not only to your present condition but also for future outcomes. This test combines measurements of you ventilation, respiratory gas exchange in the lungs, electrocardiogram (EKG), blood pressure and physical response before, during, and following an exercise protocol. Please refer to the handout provided to you during your appointment today.   Your physician recommends that you schedule a follow-up appointment in: 10 days for a alb only appointment and in 4 weeks for an appointment with our APP Clinic here in our office  If you have any questions or concerns before your next appointment please send a message through [place] or call our office at [phone].    TO LEAVE A MESSAGE FOR THE NURSE SELECT OPTION 2, PLEASE LEAVE A MESSAGE INCLUDING: YOUR NAME DATE OF BIRTH CALL BACK NUMBER REASON FOR CALL**this is important as we prioritize the call backs  YOU WILL RECEIVE A CALL BACK THE SAME DAY AS LONG AS YOU CALL BEFORE 4:00 PM   Do the following things EVERYDAY: Weigh yourself in the morning before breakfast. Write it down and keep it in a log. Take your medicines as prescribed Eat low salt foods--Limit salt (sodium) to 2000 mg per day.  Stay as active as you can everyday Limit all fluids for the day to less than 2 liters   At the [facility], you and your health needs are our priority. As part of our continuing mission  to provide you with exceptional heart care, we have created designated Provider Care Teams. These Care Teams include your primary Cardiologist (physician) and Advanced Practice Providers (APPs- Physician Assistants and Nurse Practitioners) who all work together to provide you with the care you need, when you need it.   You may see any of the following providers on your designated Care Team at your next follow up: Dr [phone] Dr Arvilla Meres, NP Carron Curie, Robbie Lis [place], PharmD   Please be sure to bring in all your medications bottles to every appointment.

## 2021-06-21 ENCOUNTER — Other Ambulatory Visit: Payer: Self-pay | Admitting: Neurological Surgery

## 2021-06-21 ENCOUNTER — Telehealth (HOSPITAL_COMMUNITY): Payer: Self-pay | Admitting: Cardiology

## 2021-06-21 ENCOUNTER — Telehealth (HOSPITAL_COMMUNITY): Payer: Self-pay | Admitting: Licensed Clinical Social Worker

## 2021-06-21 ENCOUNTER — Other Ambulatory Visit (HOSPITAL_COMMUNITY): Payer: Self-pay

## 2021-06-21 ENCOUNTER — Telehealth (HOSPITAL_COMMUNITY): Payer: Self-pay | Admitting: Pharmacy Technician

## 2021-06-21 DIAGNOSIS — M5416 Radiculopathy, lumbar region: Secondary | ICD-10-CM

## 2021-06-21 MED ORDER — ICOSAPENT ETHYL 1 G PO CAPS: 2.0000 g | ORAL_CAPSULE | Freq: Two times a day (BID) | ORAL | 11 refills | Status: DC

## 2021-06-21 NOTE — Telephone Encounter (Signed)
Patient Advocate Encounter   Received notification from [employer] that prior authorization for Icosapent Ethyl 1GM capsules is required.   PA submitted on CoverMyMeds Key  [id number] Status is pending   Will continue to follow.

## 2021-06-21 NOTE — Telephone Encounter (Signed)
-----   Message from Laurey Morale, MD sent at 06/20/2021  4:58 PM EDT ----- High triglycerides.  Start Vascepa 2 g bid.

## 2021-06-21 NOTE — Telephone Encounter (Signed)
CSW received call from pt inquiring if he still needs to be taking lipitor now that he is being prescribed vascepa if he should be stopping it.  Also informed CSW that he received a call from his insurance that Vascepa would not be covered.  CSW  sent message to CMA regarding pt continuing to take lipitor- confirmed that he should remain on this medication in conjunction with his Vascepa.  Also sent message to pharmacy team- they will pursue prior auth for pt Vascepa to get it covered.  CSW called back pt and left message informing of above  Will continue to follow and assist as needed  Burna Sis, LCSW Clinical Social Worker [facility] Desk#: [phone] Cell#: [phone]

## 2021-06-21 NOTE — Progress Notes (Signed)
Advanced Heart Failure Clinic Note  PCP: Sigmund Hazel, MD HF Cardiology: Dr. Shirlee Latch  CT Surgery: Dr. Dorris Fetch   50 y.o. male s/p back surgery 05/2020 was admitted for acute inferior and anterolateral MI on 06/25/20. LHC showed thrombotic occlusion of distal RCA that was treated with PTCA and thrombectomy.  He was also noted to have chronic total occlusion of the LAD with collaterals from the right (thus this territory was affected by the RCA MI) as well as 90% complex proximal LCx stenosis. EF 25-30% + apical thrombus and severe mitral regurgitation. RV normal. Also w/ post MI pericarditis, treated w/ colchicine. Course further c/b cardiogenic shock and acute hypoxic respiratory failure w/ component of septic shock 2/2 PNA, requiring intubation and treatment w/ abx. Once stabilized, he underwent CABG x 3 (LIMA-LAD, Lt radial-OM1 and sequential SVG-PDA/PLA) + MV repair and placement of Impella 5.5 on 10/13 by Dr. Dorris Fetch.   Post operatively, he developed tamponade and was taken back to the OR on 10/14 with large hematoma obstructing right atrium. He improved and was extubated 10/19.  Impella removal 10/21 w/ stable hemodynamics and able to tolerate initiation of GDMT.  Also had post-operative afib treated w/ amiodarone. He was discharged home on 07/21/20.   Echo in 3/22 showed EF 20-25% with diffuse hypokinesis, mildly decreased RV systolic function, no LV thrombus, normal IVC, s/p MV repair with trivial MR and mild stenosis (mean gradient 4 mmHg).  [employer] ICD placed in 4/22.  Later in 4/22, he had L4-L5 spinal fusion.   He presents to clinic today for followup of CHF. Weight is up 17 lbs.  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  He is able to do his job without dyspnea or fatigue.  He is generally active, no dyspnea walking on flat ground or moderate activity in general.  No chest pain.  No lightheadedness.   [employer] device  interrogation: Heartlogic score 22  Labs (11/21): LDL 59, HDL 34 Labs (1/22): digoxin level 1.2 Labs (2/22): K 4.3, creatinine 1.1 Labs (4/22): K 5, creatinine 1.02, hgb 11.8 Labs (5/22): K 4.1, creatinine 0.88, hgb 15.5, digoxin 0.4  Review of systems complete and found to be negative unless listed in HPI.    PMH: 1. Anxiety 2. Hyperlipidemia 3. CAD: Inferior and anterolateral MI 10/21.  LHC showed thrombotic occlusion distal RCA treated with PTCA/thrombectomy, CTO LAD with R=>L collaterals, 90% complex proximal LCx stenosis.  - CABG x 4 in 10/21 with LIMA-LAD, left radial-OM1, sequential SVG-PDA/PLV.  4. Mitral regurgitation: Suspect infarct-related MR.   - Mitral valve repair in 10/21 with CABG.  5. LV thrombus  6. Chronic systolic CHF: ischemic cardiomyopathy.  [employer] ICD.  Echo (10/21) with EF 25-30%, apical thrombus, severe MR, normal RV.   - Echo (3/22): EF 20-25% with diffuse hypokinesis, mildly decreased RV systolic function, no LV thrombus, normal IVC, s/p MV repair with trivial MR and mild stenosis (mean gradient 4 mmHg).   7. Post-infarct pericarditis in 10/21. 8. Atrial fibrillation: Post-op in 10/21.  9. H/o back surgery 9/21: Discectomy.     Current Outpatient Medications  Medication Sig Dispense Refill   acetaminophen (TYLENOL) 325 MG tablet Take 2 tablets (650 mg total) by mouth every 6 (six) hours as needed for mild pain.     ALPRAZolam (XANAX) 0.5 MG tablet Take 0.5 mg by mouth at bedtime.     aspirin EC 81 MG EC tablet Take 1 tablet (81 mg  total) by mouth daily. Swallow whole. 30 tablet 11   carvedilol (COREG) 6.25 MG tablet Take 1 tablet (6.25 mg total) by mouth 2 (two) times daily with a meal. 60 tablet 11   digoxin (LANOXIN) 0.125 MG tablet TAKE 1/2 TABLET BY MOUTH DAILY 15 tablet 2   empagliflozin (JARDIANCE) 10 MG TABS tablet Take 1 tablet (10 mg total) by mouth daily before breakfast. 30 tablet 11   furosemide (LASIX) 20 MG tablet Take 1 tablet  (20 mg total) by mouth daily. 30 tablet 11   Multiple Vitamin (MULTIVITAMIN WITH MINERALS) TABS tablet Take 1 tablet by mouth in the morning.     sacubitril-valsartan (ENTRESTO) 97-103 MG Take 1 tablet by mouth 2 (two) times daily. 60 tablet 11   spironolactone (ALDACTONE) 25 MG tablet Take 1 tablet (25 mg total) by mouth at bedtime. 90 tablet 3   traZODone (DESYREL) 100 MG tablet Take 100 mg by mouth at bedtime.     warfarin (COUMADIN) 2.5 MG tablet TAKE 1 TABLET BY MOUTH DAILY. EXCEPT 1/2 TABLET ON SUNDAY, TUESDAY, THURSDAY OR AS DIRECTED BY COAGULATION CLINIC 30 tablet 2   atorvastatin (LIPITOR) 80 MG tablet Take 1 tablet (80 mg total) by mouth daily. 90 tablet 3   icosapent Ethyl (VASCEPA) 1 g capsule Take 2 capsules (2 g total) by mouth 2 (two) times daily. 120 capsule 11   No current facility-administered medications for this encounter.    No Known Allergies    Social History   Socioeconomic History   Marital status: Single    Spouse name: Not on file   Number of children: Not on file   Years of education: Not on file   Highest education level: Not on file  Occupational History    Employer: [employer]  Tobacco Use   Smoking status: Never   Smokeless tobacco: Never  Vaping Use   Vaping Use: Never used  Substance and Sexual Activity   Alcohol use: Yes    Alcohol/week: 1.0 standard drink    Types: 1 Standard drinks or equivalent per week   Drug use: No   Sexual activity: Yes    Birth control/protection: None  Other Topics Concern   Not on file  Social History Narrative   Not on file   Social Determinants of Health   Financial Resource Strain: Not on file  Food Insecurity: Not on file  Transportation Needs: Not on file  Physical Activity: Not on file  Stress: Not on file  Social Connections: Not on file  Intimate Partner Violence: Not on file    FH: No family history of premature CAD.   Vitals:   06/20/21 1426  BP: 122/80  Pulse: 79  SpO2: 97%   Weight: 78.2 kg (172 lb 6.4 oz)    PHYSICAL EXAM: General: NAD Neck: JVP 9-10 cm, no thyromegaly or thyroid nodule.  Lungs: Clear to auscultation bilaterally with normal respiratory effort. CV: Nondisplaced PMI.  Heart regular S1/S2, no S3/S4, no murmur.  No peripheral edema.  No carotid bruit.  Normal pedal pulses.  Abdomen: Soft, nontender, no hepatosplenomegaly, no distention.  Skin: Intact without lesions or rashes.  Neurologic: Alert and oriented x 3.  Psych: Normal affect. Extremities: No clubbing or cyanosis.  HEENT: Normal.   ASSESSMENT & PLAN:  1. CAD: Late presentation inferior and anterolateral MI 10/21.  Cath showed thrombotic occlusion distal RCA, CTO LAD with collaterals from right, and complex 90% proximal LCx stenosis.  He had PTCA/thrombectomy RCA with good  flow at end of procedure.  Suspect he had damage to LAD territory as well as RCA territory as RCA collateralized the LAD.  S/p CABG w/ impella support with LIMA-LAD, left radial to OM, seq SVG-PDA/PLV. Post op course c/b tamponade and was taken back to the OR on 10/14 for drainage, found to have large hematoma obstructing right atrium.  No chest pain.  - Continue ASA 81 daily.  - Continue atorvastatin 80 mg daily. Check lipids.  2. 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 Echo in 10/21 with EF 25-30%.  Echo in 3/22 showed EF 20-25%, diffuse hypokinesis, mildly decreased RV systolic function.  He now has a Environmental manager ICD.  NYHA Class II symptoms but volume overloaded by exam and Heartlogic.  - Increase Entresto to 97/103 bid.  BMET today and again in 10 days.  - Continue Spironolactone 25 mg daily.   - Continue Coreg 6.25 mg bid - Continue Digoxin. Check level today.  - Continue Jardiance 10 mg daily.  - Start Lasix 20 mg daily.  - Narrow QRS so not CRT candidate.    - Arrange for CPX.  3. Severe MR: Suspect infarct-related MR. s/p MV repair at time of CABG  10/21.  Echo in 3/22 showed trivial MR, mild mitral stenosis with mean gradient 4 mmHg.  4. LV thrombus: Not present on 3/22 echo.  - Continue warfarin.  5. Post-operative atrial fibrillation: He is in NSR today. He is on warfarin, off amiodarone.  6. Post-MI pericarditis: Now off colchicine.   Followup in 1 month with APP to reassess volume.    Marca Ancona, MD 06/21/21

## 2021-06-21 NOTE — Telephone Encounter (Signed)
Patient called.  Patient aware.  

## 2021-06-23 ENCOUNTER — Other Ambulatory Visit (HOSPITAL_COMMUNITY): Payer: Self-pay

## 2021-06-23 NOTE — Telephone Encounter (Signed)
Advanced Heart Failure Patient Advocate Encounter  Prior Authorization for Jerry [name] has been approved.    PA# [id number] Effective dates: 06/21/21 through 06/21/22  Patients co-pay is $0  Called and spoke with the patient. Called and spoke with the pharmacy as well, they are getting the medication ready.  Archer Asa, CPhT

## 2021-06-27 ENCOUNTER — Other Ambulatory Visit (HOSPITAL_COMMUNITY): Payer: Self-pay | Admitting: Cardiology

## 2021-06-30 ENCOUNTER — Other Ambulatory Visit: Payer: Self-pay

## 2021-06-30 ENCOUNTER — Ambulatory Visit (INDEPENDENT_AMBULATORY_CARE_PROVIDER_SITE_OTHER): Payer: 59 | Admitting: *Deleted

## 2021-06-30 ENCOUNTER — Ambulatory Visit (HOSPITAL_COMMUNITY)
Admission: RE | Admit: 2021-06-30 | Discharge: 2021-06-30 | Disposition: A | Discharge status: Home / Self Care | Payer: 59 | Source: Ambulatory Visit | Attending: Cardiology | Admitting: Cardiology

## 2021-06-30 DIAGNOSIS — I5022 Chronic systolic (congestive) heart failure: Secondary | ICD-10-CM | POA: Diagnosis not present

## 2021-06-30 DIAGNOSIS — I824Y2 Acute embolism and thrombosis of unspecified deep veins of left proximal lower extremity: Secondary | ICD-10-CM | POA: Diagnosis not present

## 2021-06-30 DIAGNOSIS — Z5181 Encounter for therapeutic drug level monitoring: Secondary | ICD-10-CM

## 2021-06-30 DIAGNOSIS — I4891 Unspecified atrial fibrillation: Secondary | ICD-10-CM | POA: Diagnosis not present

## 2021-06-30 LAB — BASIC METABOLIC PANEL
Anion gap: 10 (ref 5–15)
BUN: 22 mg/dL — ABNORMAL HIGH (ref 6–20)
CO2: 28 mmol/L (ref 22–32)
Calcium: 9.7 mg/dL (ref 8.9–10.3)
Chloride: 101 mmol/L (ref 98–111)
Creatinine, Ser: 1.05 mg/dL (ref 0.61–1.24)
GFR, Estimated: >60 mL/min (ref >60)
Glucose, Bld: 99 mg/dL (ref 70–99)
Potassium: 4.6 mmol/L (ref 3.5–5.1)
Sodium: 139 mmol/L (ref 135–145)

## 2021-06-30 LAB — POCT INR: INR: 1.7 — AB (ref 2.0–3.0)

## 2021-06-30 LAB — DIGOXIN LEVEL: Digoxin Level: 0.3 ng/mL — ABNORMAL LOW (ref 0.8–2.0)

## 2021-06-30 NOTE — Patient Instructions (Addendum)
Description   Today take 1 tablet then continue taking warfarin 1 tablet daily except for 1/2 tablet on Sundays, Tuesdays and Thursdays. Recheck INR in 3 weeks. [facility]in Clinic [phone]

## 2021-07-04 ENCOUNTER — Other Ambulatory Visit: Payer: Self-pay

## 2021-07-04 ENCOUNTER — Encounter (HOSPITAL_COMMUNITY): Payer: Self-pay | Admitting: *Deleted

## 2021-07-04 ENCOUNTER — Ambulatory Visit (HOSPITAL_COMMUNITY): Payer: 59

## 2021-07-04 DIAGNOSIS — I5022 Chronic systolic (congestive) heart failure: Secondary | ICD-10-CM

## 2021-07-04 MED ORDER — TORSEMIDE 20 MG PO TABS: 20.0000 mg | ORAL_TABLET | Freq: Every day | ORAL | 3 refills | Status: DC

## 2021-07-04 NOTE — Patient Instructions (Signed)
STOP Lasix START Torsemide 20 mg, one tab daily  Labs needed in one week  Keep follow up as scheduled   Do the following things EVERYDAY: Weigh yourself in the morning before breakfast. Write it down and keep it in a log. Take your medicines as prescribed Eat low salt foods--Limit salt (sodium) to 2000 mg per day.  Stay as active as you can everyday Limit all fluids for the day to less than 2 liters

## 2021-07-04 NOTE — Progress Notes (Signed)
Patient expressed concern for no drop in fluid weight since last visit and addition of Lasix to his medications. His weight is unchanged and he feels this in his abdomen today. Consulted with Robbie Lis and she advised patient with stopping lasix and starting torsemide. Patient is to return for labs in a week (Tuesday --10/18 afternoon).   Will get patient's weight on 10/18 when comes for labs and determine if CPX appropriate with weight and fluid check. If okay, then will schedule patient for CPX prior to clinic visit 10/25.    Reggy Eye, MS, ACSM, [facility] Clinical Exercise Physiologist/ Health and Wellness Coach

## 2021-07-08 ENCOUNTER — Ambulatory Visit (INDEPENDENT_AMBULATORY_CARE_PROVIDER_SITE_OTHER): Payer: 59

## 2021-07-08 DIAGNOSIS — I5022 Chronic systolic (congestive) heart failure: Secondary | ICD-10-CM

## 2021-07-12 ENCOUNTER — Ambulatory Visit (HOSPITAL_COMMUNITY)
Admission: RE | Admit: 2021-07-12 | Discharge: 2021-07-12 | Disposition: A | Discharge status: Home / Self Care | Payer: 59 | Source: Ambulatory Visit | Attending: Internal Medicine | Admitting: Internal Medicine

## 2021-07-12 ENCOUNTER — Other Ambulatory Visit: Payer: Self-pay

## 2021-07-12 DIAGNOSIS — I5022 Chronic systolic (congestive) heart failure: Secondary | ICD-10-CM | POA: Insufficient documentation

## 2021-07-12 LAB — CUP PACEART REMOTE DEVICE CHECK
Battery Remaining Longevity: 180 mo
Battery Remaining Percentage: 100 %
Brady Statistic RV Percent Paced: 0 %
Date Time Interrogation Session: 20221014054500
HighPow Impedance: 84 Ohm
Implantable Lead Implant Date: 20220413
Implantable Lead Location: 753860
Implantable Lead Model: 183
Implantable Lead Serial Number: 303520
Implantable Pulse Generator Implant Date: 20220413
Lead Channel Impedance Value: 528 Ohm
Lead Channel Pacing Threshold Amplitude: 0.5 V
Lead Channel Pacing Threshold Pulse Width: 0.4 ms
Lead Channel Setting Pacing Amplitude: 2 V
Lead Channel Setting Pacing Pulse Width: 0.4 ms
Lead Channel Setting Sensing Sensitivity: 0.5 mV
Pulse Gen Serial Number: 212369

## 2021-07-12 LAB — BASIC METABOLIC PANEL
Anion gap: 9 (ref 5–15)
BUN: 49 mg/dL — ABNORMAL HIGH (ref 6–20)
CO2: 25 mmol/L (ref 22–32)
Calcium: 9.7 mg/dL (ref 8.9–10.3)
Chloride: 101 mmol/L (ref 98–111)
Creatinine, Ser: 1.15 mg/dL (ref 0.61–1.24)
GFR, Estimated: >60 mL/min (ref >60)
Glucose, Bld: 88 mg/dL (ref 70–99)
Potassium: 4.1 mmol/L (ref 3.5–5.1)
Sodium: 135 mmol/L (ref 135–145)

## 2021-07-15 ENCOUNTER — Telehealth (HOSPITAL_COMMUNITY): Payer: Self-pay | Admitting: Cardiology

## 2021-07-15 MED ORDER — TORSEMIDE 20 MG PO TABS: 20.0000 mg | ORAL_TABLET | Freq: Every day | ORAL | 3 refills | Status: DC

## 2021-07-15 NOTE — Telephone Encounter (Signed)
-----   Message from Allayne Butcher, [place] sent at 07/13/2021  3:49 PM EDT ----- Labs suggest he may be a bit dehydrated. He apparently self increased torsemide to 40 mg daily recently. Recommend he reduce torsemide back to 20 mg daily. Can take extra 20 mg PRN if >3 lb gain in 24 hrs.

## 2021-07-15 NOTE — Telephone Encounter (Signed)
Pt aware of labs and voiced understanding

## 2021-07-17 ENCOUNTER — Other Ambulatory Visit (HOSPITAL_COMMUNITY): Payer: Self-pay | Admitting: Cardiology

## 2021-07-18 NOTE — Progress Notes (Signed)
Advanced Heart Failure Clinic Note  PCP: Sigmund Hazel, MD HF Cardiology: Dr. Shirlee Latch  CT Surgery: Dr. Dorris Fetch   50 y.o. male s/p back surgery 05/2020 was admitted for acute inferior and anterolateral MI on 06/25/20. LHC showed thrombotic occlusion of distal RCA that was treated with PTCA and thrombectomy.  He was also noted to have chronic total occlusion of the LAD with collaterals from the right (thus this territory was affected by the RCA MI) as well as 90% complex proximal LCx stenosis. EF 25-30% + apical thrombus and severe mitral regurgitation. RV normal. Also w/ post MI pericarditis, treated w/ colchicine. Course further c/b cardiogenic shock and acute hypoxic respiratory failure w/ component of septic shock 2/2 PNA, requiring intubation and treatment w/ abx. Once stabilized, he underwent CABG x 3 (LIMA-LAD, Lt radial-OM1 and sequential SVG-PDA/PLA) + MV repair and placement of Impella 5.5 on 10/13 by Dr. Dorris Fetch.   Post operatively, he developed tamponade and was taken back to the OR on 10/14 with large hematoma obstructing right atrium. He improved and was extubated 10/19.  Impella removal 10/21 w/ stable hemodynamics and able to tolerate initiation of GDMT.  Also had post-operative afib treated w/ amiodarone. He was discharged home on 07/21/20.   Echo in 3/22 showed EF 20-25% with diffuse hypokinesis, mildly decreased RV systolic function, no LV thrombus, normal IVC, s/p MV repair with trivial MR and mild stenosis (mean gradient 4 mmHg).  [employer] ICD placed in 4/22.  Later in 4/22, he had L4-L5 spinal fusion.   Follow up 9/22 weight up 17 lbs, Entresto increased and lasix 20 mg started. CPX arranged.   Today he returns for HF follow up with his girlfriend. He felt lasix was not working, switched to torsemide 40. Repeat labs showed increased BUN and instructed to decrease to 20 mg daily but he stayed at 40 mg daily. He is worried that his heart is getting weaker.  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 He has no significant dyspnea at work. Denies CP, dizziness, abnormal bleeding, edema, or PND/Orthopnea. Appetite ok. No fever or chills. Weight at home 162 pounds. Taking all medications. sBP at home 90-95, he is not light-headed or had any syncopal episodes.  [employer] device interrogation: Heartlogic score 14  Labs (11/21): LDL 59, HDL 34 Labs (1/22): digoxin level 1.2 Labs (2/22): K 4.3, creatinine 1.1 Labs (4/22): K 5, creatinine 1.02, hgb 11.8 Labs (5/22): K 4.1, creatinine 0.88, hgb 15.5, digoxin 0.4 Labs (9/22): LDL 34, TG 315=>Vascepa started. Labs (10/22): K 4.1, creatinine 1.15,   Review of systems complete and found to be negative unless listed in HPI.    PMH: 1. Anxiety 2. Hyperlipidemia 3. CAD: Inferior and anterolateral MI 10/21.  LHC showed thrombotic occlusion distal RCA treated with PTCA/thrombectomy, CTO LAD with R=>L collaterals, 90% complex proximal LCx stenosis.  - CABG x 4 in 10/21 with LIMA-LAD, left radial-OM1, sequential SVG-PDA/PLV.  4. Mitral regurgitation: Suspect infarct-related MR.   - Mitral valve repair in 10/21 with CABG.  5. LV thrombus  6. Chronic systolic CHF: ischemic cardiomyopathy.  [employer] ICD.  Echo (10/21) with EF 25-30%, apical thrombus, severe MR, normal RV.   - Echo (3/22): EF 20-25% with diffuse hypokinesis, mildly decreased RV systolic function, no LV thrombus, normal IVC, s/p MV repair with trivial MR and mild stenosis (mean gradient 4 mmHg).   7. Post-infarct pericarditis in 10/21. 8. Atrial fibrillation: Post-op in 10/21.  9. H/o back  surgery 9/21: Discectomy.    Current Outpatient Medications  Medication Sig Dispense Refill   acetaminophen (TYLENOL) 325 MG tablet Take 2 tablets (650 mg total) by mouth every 6 (six) hours as needed for mild pain.     ALPRAZolam (XANAX) 1 MG tablet Take 1 mg by mouth at bedtime as needed for anxiety.     aspirin EC 81  MG EC tablet Take 1 tablet (81 mg total) by mouth daily. Swallow whole. 30 tablet 11   atorvastatin (LIPITOR) 80 MG tablet Take 1 tablet (80 mg total) by mouth daily. 90 tablet 3   carvedilol (COREG) 6.25 MG tablet Take 1 tablet (6.25 mg total) by mouth 2 (two) times daily with a meal. 60 tablet 11   digoxin (LANOXIN) 0.125 MG tablet TAKE 1/2 TABLET BY MOUTH DAILY 15 tablet 11   icosapent Ethyl (VASCEPA) 1 g capsule Take 2 capsules (2 g total) by mouth 2 (two) times daily. 120 capsule 11   JARDIANCE 10 MG TABS tablet TAKE 1 TABLET(10 MG) BY MOUTH DAILY BEFORE BREAKFAST 30 tablet 11   Multiple Vitamin (MULTIVITAMIN WITH MINERALS) TABS tablet Take 1 tablet by mouth in the morning.     sacubitril-valsartan (ENTRESTO) 97-103 MG Take 1 tablet by mouth 2 (two) times daily. 60 tablet 11   spironolactone (ALDACTONE) 25 MG tablet Take 1 tablet (25 mg total) by mouth at bedtime. 90 tablet 3   torsemide (DEMADEX) 20 MG tablet Take 1 tablet (20 mg total) by mouth daily. May take an additional tablet as needed for weight gain or swelling (Patient taking differently: Take 20 mg by mouth daily. May take an additional tablet as needed for weight gain or swelling(Patient has been taking 40 mg daily.) 45 tablet 3   traZODone (DESYREL) 100 MG tablet Take 100 mg by mouth at bedtime.     warfarin (COUMADIN) 2.5 MG tablet TAKE 1 TABLET BY MOUTH DAILY. EXCEPT 1/2 TABLET ON SUNDAY, TUESDAY, THURSDAY OR AS DIRECTED BY COAGULATION CLINIC 30 tablet 2   No current facility-administered medications for this encounter.   No Known Allergies  Social History   Socioeconomic History   Marital status: Single    Spouse name: Not on file   Number of children: Not on file   Years of education: Not on file   Highest education level: Not on file  Occupational History    Employer: [employer]  Tobacco Use   Smoking status: Never   Smokeless tobacco: Never  Vaping Use   Vaping Use: Never used  Substance and Sexual  Activity   Alcohol use: Yes    Alcohol/week: 1.0 standard drink    Types: 1 Standard drinks or equivalent per week   Drug use: No   Sexual activity: Yes    Birth control/protection: None  Other Topics Concern   Not on file  Social History Narrative   Not on file   Social Determinants of Health   Financial Resource Strain: Not on file  Food Insecurity: Not on file  Transportation Needs: Not on file  Physical Activity: Not on file  Stress: Not on file  Social Connections: Not on file  Intimate Partner Violence: Not on file   FH: No family history of premature CAD.   BP (!) 89/56   Pulse 93   Wt 74.2 kg (163 lb 9.6 oz)   SpO2 97%   BMI 24.88 kg/m   Wt Readings from Last 3 Encounters:  07/19/21 74.2 kg (163 lb 9.6 oz)  06/20/21 78.2 kg (172 lb 6.4 oz)  05/17/21 73.5 kg (162 lb)   PHYSICAL EXAM: ReDs: 34% General:  NAD. No resp difficulty, moderately anxious & tearful. HEENT: Normal Neck: Supple. No JVD. Carotids 2+ bilat; no bruits. No lymphadenopathy or thryomegaly appreciated. Cor: PMI nondisplaced. Regular rate & rhythm. No rubs, gallops or murmurs. Lungs: Clear Abdomen: Soft, nontender, nondistended. No hepatosplenomegaly. No bruits or masses. Good bowel sounds. Extremities: No cyanosis, clubbing, rash, edema Neuro: Alert & oriented x 3, cranial nerves grossly intact. Moves all 4 extremities w/o difficulty. Tearful and anxious.  ASSESSMENT & PLAN: 1. CAD: Late presentation inferior and anterolateral MI 10/21.  Cath showed thrombotic occlusion distal RCA, CTO LAD with collaterals from right, and complex 90% proximal LCx stenosis.  He had PTCA/thrombectomy RCA with good flow at end of procedure.  Suspect he had damage to LAD territory as well as RCA territory as RCA collateralized the LAD.  S/p CABG w/ impella support with LIMA-LAD, left radial to OM, seq SVG-PDA/PLV. Post op course c/b tamponade and was taken back to the OR on 10/14 for drainage, found to have large  hematoma obstructing right atrium.  No chest pain.  - Continue ASA 81 daily.  - Continue atorvastatin 80 mg daily + Vascepa. LDL (9/22) 34, TG 315.  - Repeat lipids in 6-8 weeks. 2. 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 Echo in 10/21 with EF 25-30%.  Echo in 3/22 showed EF 20-25%, diffuse hypokinesis, mildly decreased RV systolic function.  He now has a Environmental manager ICD.  NYHA Class II symptoms, weight down 9 lbs, ReDs 34%, but mildly volume up on device interrogation and he feels he has more fluid in abdomen. - OK to increase torsemide to 60 mg x 5 days then back to 40 mg daily. BMET today, repeat in 10 days. -  With BP low, discussed decreasing  Entresto to 49/51, however he would like to keep current dose. He will keep a check on BP at home and decrease to 49/51 if he becomes symptomatic. - Continue Spironolactone 25 mg daily.   - Continue Coreg 6.25 mg bid - Continue Digoxin 0.0625 mg daily. Recent level 0.3 (06/30/21). - Continue Jardiance 10 mg daily.  - Narrow QRS so not CRT candidate.    - Had CPX today, results pending. - Discussed limiting fluids to <2L/day and limiting high-sodium foods. - Discussed continuing daily weights and notifying clinic if weight up 3lbs/24 hours or 5 lbs/week. 3. Severe MR: Suspect infarct-related MR. s/p MV repair at time of CABG 10/21.  Echo in 3/22 showed trivial MR, mild mitral stenosis with mean gradient 4 mmHg.  4. LV thrombus: Not present on 3/22 echo.  - Continue warfarin. INR followed by [facility] Clinic. 5. Post-operative atrial fibrillation: He is regular on exam today. He is on warfarin, off amiodarone.  6. Post-MI pericarditis: Now off colchicine.   Greater than 50% of the (total minutes 30 minutes) visit spent in counseling/coordination of care regarding (details of discussion current medications and lifestyle modification).   Followup in 3 months with Dr. Shirlee Latch.   Anderson [place],  FNP 07/19/21

## 2021-07-18 NOTE — Progress Notes (Signed)
Remote ICD transmission.   

## 2021-07-19 ENCOUNTER — Other Ambulatory Visit (HOSPITAL_COMMUNITY): Payer: Self-pay | Admitting: *Deleted

## 2021-07-19 ENCOUNTER — Ambulatory Visit (HOSPITAL_COMMUNITY)
Admission: RE | Admit: 2021-07-19 | Discharge: 2021-07-19 | Disposition: A | Discharge status: Home / Self Care | Payer: 59 | Source: Ambulatory Visit | Attending: Family Medicine | Admitting: Family Medicine

## 2021-07-19 ENCOUNTER — Ambulatory Visit (HOSPITAL_COMMUNITY): Payer: 59

## 2021-07-19 ENCOUNTER — Other Ambulatory Visit: Payer: Self-pay

## 2021-07-19 ENCOUNTER — Encounter (HOSPITAL_COMMUNITY): Payer: Self-pay

## 2021-07-19 VITALS — BP 89/56 | HR 93 | Wt 163.6 lb

## 2021-07-19 DIAGNOSIS — I252 Old myocardial infarction: Secondary | ICD-10-CM | POA: Diagnosis not present

## 2021-07-19 DIAGNOSIS — I4891 Unspecified atrial fibrillation: Secondary | ICD-10-CM | POA: Diagnosis not present

## 2021-07-19 DIAGNOSIS — I5022 Chronic systolic (congestive) heart failure: Secondary | ICD-10-CM

## 2021-07-19 DIAGNOSIS — Z7984 Long term (current) use of oral hypoglycemic drugs: Secondary | ICD-10-CM | POA: Insufficient documentation

## 2021-07-19 DIAGNOSIS — Z9889 Other specified postprocedural states: Secondary | ICD-10-CM | POA: Diagnosis not present

## 2021-07-19 DIAGNOSIS — Z7982 Long term (current) use of aspirin: Secondary | ICD-10-CM | POA: Insufficient documentation

## 2021-07-19 DIAGNOSIS — I236 Thrombosis of atrium, auricular appendage, and ventricle as current complications following acute myocardial infarction: Secondary | ICD-10-CM

## 2021-07-19 DIAGNOSIS — I2510 Atherosclerotic heart disease of native coronary artery without angina pectoris: Secondary | ICD-10-CM | POA: Diagnosis not present

## 2021-07-19 DIAGNOSIS — Z95810 Presence of automatic (implantable) cardiac defibrillator: Secondary | ICD-10-CM | POA: Insufficient documentation

## 2021-07-19 DIAGNOSIS — Z951 Presence of aortocoronary bypass graft: Secondary | ICD-10-CM | POA: Insufficient documentation

## 2021-07-19 DIAGNOSIS — Z79899 Other long term (current) drug therapy: Secondary | ICD-10-CM | POA: Diagnosis not present

## 2021-07-19 DIAGNOSIS — Z7901 Long term (current) use of anticoagulants: Secondary | ICD-10-CM | POA: Insufficient documentation

## 2021-07-19 DIAGNOSIS — I480 Paroxysmal atrial fibrillation: Secondary | ICD-10-CM

## 2021-07-19 LAB — BASIC METABOLIC PANEL
Anion gap: 9 (ref 5–15)
BUN: 55 mg/dL — ABNORMAL HIGH (ref 6–20)
CO2: 27 mmol/L (ref 22–32)
Calcium: 9.7 mg/dL (ref 8.9–10.3)
Chloride: 99 mmol/L (ref 98–111)
Creatinine, Ser: 1.21 mg/dL (ref 0.61–1.24)
GFR, Estimated: >60 mL/min (ref >60)
Glucose, Bld: 104 mg/dL — ABNORMAL HIGH (ref 70–99)
Potassium: 4.2 mmol/L (ref 3.5–5.1)
Sodium: 135 mmol/L (ref 135–145)

## 2021-07-19 MED ORDER — TORSEMIDE 20 MG PO TABS: ORAL_TABLET | ORAL | 3 refills | Status: DC

## 2021-07-19 NOTE — Patient Instructions (Signed)
INCREASE Torsemide to 60 mg, daily for 5 days then resume normal dose of 40 mg daily thereafter  Labs today We will only contact you if something comes back abnormal or we need to make some changes. Otherwise no news is good news!  Labs needed in 7-10 days  Your physician recommends that you schedule a follow-up appointment in: 3 months with Dr Shirlee Latch   Do the following things EVERYDAY: Weigh yourself in the morning before breakfast. Write it down and keep it in a log. Take your medicines as prescribed Eat low salt foods--Limit salt (sodium) to 2000 mg per day.  Stay as active as you can everyday Limit all fluids for the day to less than 2 liters  At the [facility], you and your health needs are our priority. As part of our continuing mission to provide you with exceptional heart care, we have created designated Provider Care Teams. These Care Teams include your primary Cardiologist (physician) and Advanced Practice Providers (APPs- Physician Assistants and Nurse Practitioners) who all work together to provide you with the care you need, when you need it.   You may see any of the following providers on your designated Care Team at your next follow up: Dr Arvilla Meres Dr Carron Curie, NP Robbie Lis, [place] [facility] [place], [place] Karle Plumber, PharmD   Please be sure to bring in all your medications bottles to every appointment.

## 2021-07-19 NOTE — Progress Notes (Signed)
ReDS Vest / Clip - 07/19/21 1600       ReDS Vest / Clip   Station Marker C    Ruler Value 25    ReDS Value Range Low volume    ReDS Actual Value 34

## 2021-07-26 ENCOUNTER — Ambulatory Visit (INDEPENDENT_AMBULATORY_CARE_PROVIDER_SITE_OTHER): Payer: 59

## 2021-07-26 ENCOUNTER — Other Ambulatory Visit: Payer: Self-pay

## 2021-07-26 DIAGNOSIS — I824Y2 Acute embolism and thrombosis of unspecified deep veins of left proximal lower extremity: Secondary | ICD-10-CM | POA: Diagnosis not present

## 2021-07-26 DIAGNOSIS — Z5181 Encounter for therapeutic drug level monitoring: Secondary | ICD-10-CM

## 2021-07-26 DIAGNOSIS — I4891 Unspecified atrial fibrillation: Secondary | ICD-10-CM

## 2021-07-26 LAB — POCT INR: INR: 1.5 — AB (ref 2.0–3.0)

## 2021-07-26 NOTE — Patient Instructions (Signed)
Description   Take 1.5 tablets today, then start taking warfarin 1 tablet daily except for 1/2 tablet on Sundays and Thursdays. Recheck INR in 2 weeks. Coumadin Clinic [phone]

## 2021-07-27 ENCOUNTER — Telehealth (HOSPITAL_COMMUNITY): Payer: Self-pay | Admitting: *Deleted

## 2021-07-27 MED ORDER — TORSEMIDE 20 MG PO TABS: 60.0000 mg | ORAL_TABLET | Freq: Every day | ORAL | 3 refills | Status: DC

## 2021-07-27 NOTE — Telephone Encounter (Signed)
-----   Message from Jacklynn Ganong, [place] sent at 07/20/2021  7:45 AM EDT ----- Your BUN continues to rise. Please keep torsemide at 40 mg daily and do no increase as discussed at visit.

## 2021-07-27 NOTE — Telephone Encounter (Signed)
Modesta Messing, CMA  07/27/2021 10:16 AM EDT Back to Top    Pt returned call pt said he saw his labs and never increased torsemide he kept it at 40mg  daily.  Pt has repeat labs tomorrow 11/3.    13/3, Theresia Bough  07/25/2021  5:14 PM EDT     Patient called.  Left message for patient to call back.  [phone])   Judie Petit [place], FNP  07/20/2021  7:45 AM EDT     Your BUN continues to rise. Please keep torsemide at 40 mg daily and do no increase as discussed at visit.

## 2021-07-28 ENCOUNTER — Other Ambulatory Visit: Payer: Self-pay

## 2021-07-28 ENCOUNTER — Ambulatory Visit (HOSPITAL_COMMUNITY)
Admission: RE | Admit: 2021-07-28 | Discharge: 2021-07-28 | Disposition: A | Discharge status: Home / Self Care | Payer: 59 | Source: Ambulatory Visit | Attending: Internal Medicine | Admitting: Internal Medicine

## 2021-07-28 DIAGNOSIS — I5022 Chronic systolic (congestive) heart failure: Secondary | ICD-10-CM | POA: Diagnosis not present

## 2021-07-28 LAB — BASIC METABOLIC PANEL
Anion gap: 7 (ref 5–15)
BUN: 39 mg/dL — ABNORMAL HIGH (ref 6–20)
CO2: 27 mmol/L (ref 22–32)
Calcium: 9.2 mg/dL (ref 8.9–10.3)
Chloride: 101 mmol/L (ref 98–111)
Creatinine, Ser: 1.22 mg/dL (ref 0.61–1.24)
GFR, Estimated: >60 mL/min (ref >60)
Glucose, Bld: 102 mg/dL — ABNORMAL HIGH (ref 70–99)
Potassium: 3.6 mmol/L (ref 3.5–5.1)
Sodium: 135 mmol/L (ref 135–145)

## 2021-08-04 ENCOUNTER — Ambulatory Visit
Admission: RE | Admit: 2021-08-04 | Discharge: 2021-08-04 | Disposition: A | Discharge status: Home / Self Care | Payer: 59 | Source: Ambulatory Visit | Attending: Neurological Surgery | Admitting: Neurological Surgery

## 2021-08-04 ENCOUNTER — Other Ambulatory Visit: Payer: Self-pay

## 2021-08-04 DIAGNOSIS — M5416 Radiculopathy, lumbar region: Secondary | ICD-10-CM

## 2021-08-09 ENCOUNTER — Other Ambulatory Visit: Payer: Self-pay

## 2021-08-09 ENCOUNTER — Ambulatory Visit: Payer: 59

## 2021-08-09 DIAGNOSIS — I4891 Unspecified atrial fibrillation: Secondary | ICD-10-CM

## 2021-08-09 DIAGNOSIS — Z5181 Encounter for therapeutic drug level monitoring: Secondary | ICD-10-CM | POA: Diagnosis not present

## 2021-08-09 DIAGNOSIS — I824Y2 Acute embolism and thrombosis of unspecified deep veins of left proximal lower extremity: Secondary | ICD-10-CM | POA: Diagnosis not present

## 2021-08-09 LAB — POCT INR: INR: 1.7 — AB (ref 2.0–3.0)

## 2021-08-09 NOTE — Patient Instructions (Signed)
Description   Start taking warfarin 1 tablet daily. Recheck INR in 2 weeks. Coumadin Clinic [phone]

## 2021-08-22 ENCOUNTER — Telehealth (HOSPITAL_COMMUNITY): Payer: Self-pay | Admitting: *Deleted

## 2021-08-22 MED ORDER — ENTRESTO 49-51 MG PO TABS: 1.0000 | ORAL_TABLET | Freq: Two times a day (BID) | ORAL | 6 refills | Status: DC

## 2021-08-22 NOTE — Telephone Encounter (Signed)
Pt called to report he can not tolerate the Entresto 97/103 mg dose, he states since we was increased to this dose on 9/26 his BP has been running on lower end, running low 80's to upper 70s over 40-50s. He c/o increased fatigue and a couple of presyncopal episodes. He states he decreased dose to 49/51 mg on Thur 11/24 and he has felt a little better since then and BP running upper 80s. He would like new rx for the 49/51 mg tabs. Per previous charts pt was to keep log of BPs and contact us to decrease entresto for continuously low readings. New rx sent in for 49/51 mg tabs, pt will continue to monitor BP and let us know if it continues to run low

## 2021-08-23 ENCOUNTER — Other Ambulatory Visit: Payer: Self-pay

## 2021-08-23 ENCOUNTER — Ambulatory Visit (INDEPENDENT_AMBULATORY_CARE_PROVIDER_SITE_OTHER): Payer: 59

## 2021-08-23 DIAGNOSIS — I4891 Unspecified atrial fibrillation: Secondary | ICD-10-CM | POA: Diagnosis not present

## 2021-08-23 DIAGNOSIS — I824Y2 Acute embolism and thrombosis of unspecified deep veins of left proximal lower extremity: Secondary | ICD-10-CM | POA: Diagnosis not present

## 2021-08-23 DIAGNOSIS — Z5181 Encounter for therapeutic drug level monitoring: Secondary | ICD-10-CM

## 2021-08-23 LAB — POCT INR: INR: 4.1 — AB (ref 2.0–3.0)

## 2021-08-23 NOTE — Patient Instructions (Signed)
Description   Skip today's dosage of Warfarin, then start taking warfarin 1 tablet daily except 1/2 tablet on Tuesdays and Saturdays. Recheck INR in 2 weeks. Coumadin Clinic [phone]

## 2021-09-06 ENCOUNTER — Ambulatory Visit (INDEPENDENT_AMBULATORY_CARE_PROVIDER_SITE_OTHER): Payer: 59 | Admitting: *Deleted

## 2021-09-06 ENCOUNTER — Other Ambulatory Visit: Payer: Self-pay

## 2021-09-06 DIAGNOSIS — I824Y2 Acute embolism and thrombosis of unspecified deep veins of left proximal lower extremity: Secondary | ICD-10-CM

## 2021-09-06 DIAGNOSIS — I4891 Unspecified atrial fibrillation: Secondary | ICD-10-CM

## 2021-09-06 DIAGNOSIS — Z5181 Encounter for therapeutic drug level monitoring: Secondary | ICD-10-CM | POA: Diagnosis not present

## 2021-09-06 LAB — POCT INR: INR: 1.7 — AB (ref 2.0–3.0)

## 2021-09-06 NOTE — Patient Instructions (Addendum)
Description   Today take 1 tablet then continue taking warfarin 1 tablet daily except 1/2 tablet on Tuesdays and Saturdays. Recheck INR in 3 weeks per pt request explained risk of clot and stroke. [facility] [phone]

## 2021-09-13 ENCOUNTER — Other Ambulatory Visit: Payer: Self-pay

## 2021-09-13 MED ORDER — WARFARIN SODIUM 2.5 MG PO TABS: ORAL_TABLET | ORAL | 3 refills | Status: DC

## 2021-09-24 ENCOUNTER — Other Ambulatory Visit (HOSPITAL_COMMUNITY): Payer: Self-pay | Admitting: Cardiology

## 2021-09-27 ENCOUNTER — Ambulatory Visit (INDEPENDENT_AMBULATORY_CARE_PROVIDER_SITE_OTHER): Payer: 59

## 2021-09-27 ENCOUNTER — Other Ambulatory Visit: Payer: Self-pay

## 2021-09-27 DIAGNOSIS — I824Y2 Acute embolism and thrombosis of unspecified deep veins of left proximal lower extremity: Secondary | ICD-10-CM | POA: Diagnosis not present

## 2021-09-27 DIAGNOSIS — I4891 Unspecified atrial fibrillation: Secondary | ICD-10-CM

## 2021-09-27 DIAGNOSIS — Z5181 Encounter for therapeutic drug level monitoring: Secondary | ICD-10-CM

## 2021-09-27 LAB — POCT INR: INR: 2.4 (ref 2.0–3.0)

## 2021-09-27 NOTE — Patient Instructions (Signed)
Description   Continue taking warfarin 1 tablet daily except 1/2 tablet on Tuesdays and Saturdays. Recheck INR in 4 weeks.  [facility] [phone]

## 2021-10-07 ENCOUNTER — Ambulatory Visit (INDEPENDENT_AMBULATORY_CARE_PROVIDER_SITE_OTHER): Payer: 59

## 2021-10-07 DIAGNOSIS — I4891 Unspecified atrial fibrillation: Secondary | ICD-10-CM

## 2021-10-07 LAB — CUP PACEART REMOTE DEVICE CHECK
Battery Remaining Longevity: 180 mo
Battery Remaining Percentage: 100 %
Brady Statistic RV Percent Paced: 0 %
Date Time Interrogation Session: 20230113040100
HighPow Impedance: 85 Ohm
Implantable Lead Implant Date: 20220413
Implantable Lead Location: 753860
Implantable Lead Model: 183
Implantable Lead Serial Number: 303520
Implantable Pulse Generator Implant Date: 20220413
Lead Channel Impedance Value: 557 Ohm
Lead Channel Pacing Threshold Amplitude: 0.7 V
Lead Channel Pacing Threshold Pulse Width: 0.4 ms
Lead Channel Setting Pacing Amplitude: 2 V
Lead Channel Setting Pacing Pulse Width: 0.4 ms
Lead Channel Setting Sensing Sensitivity: 0.5 mV
Pulse Gen Serial Number: 212369

## 2021-10-18 ENCOUNTER — Ambulatory Visit (HOSPITAL_COMMUNITY)
Admission: RE | Admit: 2021-10-18 | Discharge: 2021-10-18 | Disposition: A | Discharge status: Home / Self Care | Payer: 59 | Source: Ambulatory Visit | Attending: Cardiology | Admitting: Cardiology

## 2021-10-18 ENCOUNTER — Encounter (HOSPITAL_COMMUNITY): Payer: Self-pay | Admitting: Cardiology

## 2021-10-18 ENCOUNTER — Other Ambulatory Visit: Payer: Self-pay

## 2021-10-18 VITALS — BP 94/60 | HR 81 | Wt 166.4 lb

## 2021-10-18 DIAGNOSIS — I2510 Atherosclerotic heart disease of native coronary artery without angina pectoris: Secondary | ICD-10-CM | POA: Diagnosis not present

## 2021-10-18 DIAGNOSIS — I340 Nonrheumatic mitral (valve) insufficiency: Secondary | ICD-10-CM | POA: Diagnosis not present

## 2021-10-18 DIAGNOSIS — I255 Ischemic cardiomyopathy: Secondary | ICD-10-CM | POA: Insufficient documentation

## 2021-10-18 DIAGNOSIS — Z95810 Presence of automatic (implantable) cardiac defibrillator: Secondary | ICD-10-CM | POA: Insufficient documentation

## 2021-10-18 DIAGNOSIS — Z951 Presence of aortocoronary bypass graft: Secondary | ICD-10-CM | POA: Insufficient documentation

## 2021-10-18 DIAGNOSIS — Z86718 Personal history of other venous thrombosis and embolism: Secondary | ICD-10-CM | POA: Insufficient documentation

## 2021-10-18 DIAGNOSIS — Z79899 Other long term (current) drug therapy: Secondary | ICD-10-CM | POA: Insufficient documentation

## 2021-10-18 DIAGNOSIS — I5022 Chronic systolic (congestive) heart failure: Secondary | ICD-10-CM

## 2021-10-18 DIAGNOSIS — Z7901 Long term (current) use of anticoagulants: Secondary | ICD-10-CM | POA: Diagnosis not present

## 2021-10-18 DIAGNOSIS — Z7982 Long term (current) use of aspirin: Secondary | ICD-10-CM | POA: Insufficient documentation

## 2021-10-18 DIAGNOSIS — E785 Hyperlipidemia, unspecified: Secondary | ICD-10-CM | POA: Insufficient documentation

## 2021-10-18 DIAGNOSIS — I252 Old myocardial infarction: Secondary | ICD-10-CM | POA: Insufficient documentation

## 2021-10-18 LAB — BASIC METABOLIC PANEL
Anion gap: 11 (ref 5–15)
BUN: 38 mg/dL — ABNORMAL HIGH (ref 6–20)
CO2: 24 mmol/L (ref 22–32)
Calcium: 9.5 mg/dL (ref 8.9–10.3)
Chloride: 100 mmol/L (ref 98–111)
Creatinine, Ser: 1.77 mg/dL — ABNORMAL HIGH (ref 0.61–1.24)
GFR, Estimated: 46 mL/min — ABNORMAL LOW (ref >60)
Glucose, Bld: 92 mg/dL (ref 70–99)
Potassium: 4.2 mmol/L (ref 3.5–5.1)
Sodium: 135 mmol/L (ref 135–145)

## 2021-10-18 LAB — CBC
HCT: 39.8 % (ref 39.0–52.0)
Hemoglobin: 14 g/dL (ref 13.0–17.0)
MCH: 32.8 pg (ref 26.0–34.0)
MCHC: 35.2 g/dL (ref 30.0–36.0)
MCV: 93.2 fL (ref 80.0–100.0)
Platelets: 178 10*3/uL (ref 150–400)
RBC: 4.27 MIL/uL (ref 4.22–5.81)
RDW: 13.5 % (ref 11.5–15.5)
WBC: 11.2 10*3/uL — ABNORMAL HIGH (ref 4.0–10.5)
nRBC: 0 % (ref 0.0–0.2)

## 2021-10-18 LAB — LIPID PANEL
Cholesterol: 101 mg/dL (ref 0–200)
HDL: 26 mg/dL — ABNORMAL LOW (ref >40)
LDL Cholesterol: 21 mg/dL (ref 0–99)
Total CHOL/HDL Ratio: 3.9 RATIO
Triglycerides: 272 mg/dL — ABNORMAL HIGH (ref <150)
VLDL: 54 mg/dL — ABNORMAL HIGH (ref 0–40)

## 2021-10-18 NOTE — Patient Instructions (Addendum)
No medication changes today  Labs today We will only contact you if something comes back abnormal or we need to make some changes. Otherwise no news is good news!  Your physician has requested that you have an echocardiogram. Echocardiography is a painless test that uses sound waves to create images of your heart. It provides your doctor with information about the size and shape of your heart and how well your hearts chambers and valves are working. This procedure takes approximately one hour. There are no restrictions for this procedure.  Your physician recommends that you schedule a follow-up appointment in: 3 months with an ECHO  Please call office at [phone] option 2 if you have any questions or concerns.   At the [facility], you and your health needs are our priority. As part of our continuing mission to provide you with exceptional heart care, we have created designated Provider Care Teams. These Care Teams include your primary Cardiologist (physician) and Advanced Practice Providers (APPs- Physician Assistants and Nurse Practitioners) who all work together to provide you with the care you need, when you need it.   You may see any of the following providers on your designated Care Team at your next follow up: Dr Arvilla Meres Dr Carron Curie, NP Robbie Lis, [place] [facility] [place], [place] Karle Plumber, PharmD   Please be sure to bring in all your medications bottles to every appointment.

## 2021-10-18 NOTE — Progress Notes (Signed)
Remote ICD transmission.   

## 2021-10-19 ENCOUNTER — Telehealth (HOSPITAL_COMMUNITY): Payer: Self-pay | Admitting: Surgery

## 2021-10-19 NOTE — Progress Notes (Signed)
[facility] Note  PCP: Kathyrn Lass, MD HF Cardiology: Dr. Aundra Dubin  CT Surgery: Dr. Roxan Hockey   51 y.o. male s/p back surgery 05/2020 was admitted for acute inferior and anterolateral MI on 06/25/20. LHC showed thrombotic occlusion of distal RCA that was treated with PTCA and thrombectomy.  He was also noted to have chronic total occlusion of the LAD with collaterals from the right (thus this territory was affected by the RCA MI) as well as 90% complex proximal LCx stenosis. EF 25-30% + apical thrombus and severe mitral regurgitation. RV normal. Also w/ post MI pericarditis, treated w/ colchicine. Course further c/b cardiogenic shock and acute hypoxic respiratory failure w/ component of septic shock 2/2 PNA, requiring intubation and treatment w/ abx. Once stabilized, he underwent CABG x 3 (LIMA-LAD, Lt radial-OM1 and sequential SVG-PDA/PLA) + MV repair and placement of Impella 5.5 on 10/13 by Dr. Roxan Hockey.   Post operatively, he developed tamponade and was taken back to the OR on 10/14 with large hematoma obstructing right atrium. He improved and was extubated 10/19.  Impella removal 10/21 w/ stable hemodynamics and able to tolerate initiation of GDMT.  Also had post-operative afib treated w/ amiodarone. He was discharged home on 07/21/20.   Echo in 3/22 showed EF 20-25% with diffuse hypokinesis, mildly decreased RV systolic function, no LV thrombus, normal IVC, s/p MV repair with trivial MR and mild stenosis (mean gradient 4 mmHg).  [place] placed in 4/22.  Later in 4/22, he had L4-L5 spinal fusion.   CPX in 10/22 showed mild-moderate HF limitation.   He presents to clinic today for followup of CHF. Weight is stable.  He is taking torsemide 20 mg daily on most days but will take 40 mg if he has gained weight or has edema.  He is following a low Na diet.  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  Mild lightheadedness  if he stands up too fast.  No significant exertional dyspnea or chest pain.  No orthopnea/PND.  His main complaint is constipation.    [employer] device interrogation: Heartlogic score 0  Labs (11/21): LDL 59, HDL 34 Labs (1/22): digoxin level 1.2 Labs (2/22): K 4.3, creatinine 1.1 Labs (4/22): K 5, creatinine 1.02, hgb 11.8 Labs (5/22): K 4.1, creatinine 0.88, hgb 15.5, digoxin 0.4 Labs (9/22): LDL 34, TGs 315 Labs (11/22): K 3.6, creatinine 1.22  Review of systems complete and found to be negative unless listed in HPI.    PMH: 1. Anxiety 2. Hyperlipidemia 3. CAD: Inferior and anterolateral MI 10/21.  LHC showed thrombotic occlusion distal RCA treated with PTCA/thrombectomy, CTO LAD with R=>L collaterals, 90% complex proximal LCx stenosis.  - CABG x 4 in 10/21 with LIMA-LAD, left radial-OM1, sequential SVG-PDA/PLV.  4. Mitral regurgitation: Suspect infarct-related MR.   - Mitral valve repair in 10/21 with CABG.  5. LV thrombus  6. Chronic systolic CHF: ischemic cardiomyopathy.  [place].  Echo (10/21) with EF 25-30%, apical thrombus, severe MR, normal RV.   - Echo (3/22): EF 20-25% with diffuse hypokinesis, mildly decreased RV systolic function, no LV thrombus, normal IVC, s/p MV repair with trivial MR and mild stenosis (mean gradient 4 mmHg).   - CPX (10/22): RER 1.17, VE/VCO2 slope 31, peak VO2 22.7 => mild-moderate HF limitation.  7. Post-infarct pericarditis in 10/21. 8. Atrial fibrillation: Post-op in 10/21.  9. H/o back surgery 9/21: Discectomy.     Current Outpatient Medications  Medication Sig Dispense Refill   acetaminophen (TYLENOL) 325 MG tablet Take 2 tablets (650 mg total) by mouth every 6 (six) hours as needed for mild pain.     ALPRAZolam (XANAX) 1 MG tablet Take 1 mg by mouth at bedtime as needed for anxiety.     aspirin EC 81 MG EC tablet Take 1 tablet (81 mg total) by mouth daily. Swallow whole. 30 tablet 11   atorvastatin (LIPITOR) 80 MG  tablet Take 1 tablet (80 mg total) by mouth daily. 90 tablet 3   carvedilol (COREG) 6.25 MG tablet TAKE 1 TABLET(6.25 MG) BY MOUTH TWICE DAILY WITH A MEAL 60 tablet 11   digoxin (LANOXIN) 0.125 MG tablet TAKE 1/2 TABLET BY MOUTH DAILY 15 tablet 11   icosapent Ethyl (VASCEPA) 1 g capsule Take 2 capsules (2 g total) by mouth 2 (two) times daily. 120 capsule 11   JARDIANCE 10 MG TABS tablet TAKE 1 TABLET(10 MG) BY MOUTH DAILY BEFORE BREAKFAST 30 tablet 11   Multiple Vitamin (MULTIVITAMIN WITH MINERALS) TABS tablet Take 1 tablet by mouth in the morning.     sacubitril-valsartan (ENTRESTO) 49-51 MG Take 1 tablet by mouth 2 (two) times daily. 60 tablet 6   spironolactone (ALDACTONE) 25 MG tablet Take 1 tablet (25 mg total) by mouth at bedtime. 90 tablet 3   torsemide (DEMADEX) 20 MG tablet Take 20 mg by mouth daily. Takes additional tablets if weight gain     traZODone (DESYREL) 100 MG tablet Take 100 mg by mouth at bedtime.     warfarin (COUMADIN) 2.5 MG tablet TAKE 1/2 TO 1 TABLET BY MOUTH DAILY AS DIRECTED BY COAGULATION CLINIC 30 tablet 3   No current facility-administered medications for this encounter.    No Known Allergies    Social History   Socioeconomic History   Marital status: Single    Spouse name: Not on file   Number of children: Not on file   Years of education: Not on file   Highest education level: Not on file  Occupational History    Employer: [employer]  Tobacco Use   Smoking status: Never   Smokeless tobacco: Never  Vaping Use   Vaping Use: Never used  Substance and Sexual Activity   Alcohol use: Yes    Alcohol/week: 1.0 standard drink    Types: 1 Standard drinks or equivalent per week   Drug use: No   Sexual activity: Yes    Birth control/protection: None  Other Topics Concern   Not on file  Social History Narrative   Not on file   Social Determinants of Health   Financial Resource Strain: Not on file  Food Insecurity: Not on file   Transportation Needs: Not on file  Physical Activity: Not on file  Stress: Not on file  Social Connections: Not on file  Intimate Partner Violence: Not on file    FH: No family history of premature CAD.   Vitals:   10/18/21 1209  BP: 94/60  Pulse: 81  SpO2: 96%  Weight: 75.5 kg (166 lb 6.4 oz)    PHYSICAL EXAM: General: NAD Neck: No JVD, no thyromegaly or thyroid nodule.  Lungs: Clear to auscultation bilaterally with normal respiratory effort. CV: Nondisplaced PMI.  Heart regular S1/S2, no S3/S4, no murmur.  No peripheral edema.  No carotid bruit.  Normal pedal pulses.  Abdomen: Soft, nontender, no hepatosplenomegaly, no distention.  Skin: Intact without lesions or rashes.  Neurologic: Alert and oriented x 3.  Psych:  Normal affect. Extremities: No clubbing or cyanosis.  HEENT: Normal.   ASSESSMENT & PLAN:  1. CAD: Late presentation inferior and anterolateral MI 10/21.  Cath showed thrombotic occlusion distal RCA, CTO LAD with collaterals from right, and complex 90% proximal LCx stenosis.  He had PTCA/thrombectomy RCA with good flow at end of procedure.  Suspect he had damage to LAD territory as well as RCA territory as RCA collateralized the LAD.  S/p CABG w/ impella support with LIMA-LAD, left radial to OM, seq SVG-PDA/PLV. Post op course c/b tamponade and was taken back to the OR on 10/14 for drainage, found to have large hematoma obstructing right atrium.  No chest pain.  - Continue ASA 81 daily.  - Continue atorvastatin 80 mg daily. Check lipids today.  2. 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 Echo in 10/21 with EF 25-30%.  Echo in 3/22 showed EF 20-25%, diffuse hypokinesis, mildly decreased RV systolic function.  He now has a [place].  CPX in 10/22 with mild-moderate HF limitation.  NYHA Class I-II symptoms, not volume overloaded on exam.  - Continue Entresto 49/51 bid, unable to tolerate highest dose due  to orthostatic symptoms.  - Continue Spironolactone 25 mg daily, take at night.  BMET today.   - Continue Coreg 6.25 mg bid, no BP room to increase.  - Continue Digoxin. Check level today.  - Continue Jardiance 10 mg daily.  - Continue torsemide 20 mg daily with extra 20 mg for weight gain/edema.  - Narrow QRS so not CRT candidate.    - He would likely be a baroreceptor activation therapy or vagal nerve stimulation candidate, not interested at this time though.  - Repeat echo at followup in 3 months.  3. Severe MR: Suspect infarct-related MR. s/p MV repair at time of CABG 10/21.  Echo in 3/22 showed trivial MR, mild mitral stenosis with mean gradient 4 mmHg.  4. LV thrombus: Not present on 3/22 echo.  - Continue warfarin.  5. Post-operative atrial fibrillation: He is in NSR today. He is on warfarin, off amiodarone.  6. Post-MI pericarditis: Now off colchicine.   Followup in 3 months with echo   Loralie Champagne, MD 10/19/21

## 2021-10-19 NOTE — Telephone Encounter (Signed)
-----   Message from Laurey Morale, MD sent at 10/18/2021  4:40 PM EST ----- Triglycerides still high.  If he is taking Vascepa 2 g bid regularly, would add fenofibrate 48 mg daily with lipids in 2 months.

## 2021-10-19 NOTE — Telephone Encounter (Signed)
Patient called regarding lab results and recommendations per Dr. Marca Ancona.  I left a message for patient to return the call.

## 2021-10-20 ENCOUNTER — Telehealth (HOSPITAL_COMMUNITY): Payer: Self-pay

## 2021-10-20 DIAGNOSIS — E785 Hyperlipidemia, unspecified: Secondary | ICD-10-CM

## 2021-10-20 MED ORDER — FENOFIBRATE 48 MG PO TABS: 48.0000 mg | ORAL_TABLET | Freq: Every day | ORAL | 6 refills | Status: DC

## 2021-10-20 NOTE — Telephone Encounter (Addendum)
Pt aware, agreeable, and verbalized understanding  Script sent to pharmacy and follow up labs ordered and scheduled  ----- Message from Laurey Morale, MD sent at 10/18/2021  4:40 PM EST ----- Triglycerides still high.  If he is taking Vascepa 2 g bid regularly, would add fenofibrate 48 mg daily with lipids in 2 months.

## 2021-10-25 ENCOUNTER — Other Ambulatory Visit (HOSPITAL_COMMUNITY): Payer: Self-pay | Admitting: Cardiology

## 2021-10-25 ENCOUNTER — Ambulatory Visit: Payer: 59 | Admitting: *Deleted

## 2021-10-25 ENCOUNTER — Other Ambulatory Visit: Payer: Self-pay

## 2021-10-25 DIAGNOSIS — I4891 Unspecified atrial fibrillation: Secondary | ICD-10-CM | POA: Diagnosis not present

## 2021-10-25 DIAGNOSIS — I824Y2 Acute embolism and thrombosis of unspecified deep veins of left proximal lower extremity: Secondary | ICD-10-CM

## 2021-10-25 DIAGNOSIS — Z5181 Encounter for therapeutic drug level monitoring: Secondary | ICD-10-CM | POA: Diagnosis not present

## 2021-10-25 LAB — POCT INR: INR: 1.5 — AB (ref 2.0–3.0)

## 2021-10-25 NOTE — Patient Instructions (Signed)
Description   Today take 1 tablet and take 1.5 tablets tomorrow then continue taking warfarin 1 tablet daily except 1/2 tablet on Tuesdays and Saturdays. Recheck INR in 2 weeks.  [facility] [phone]

## 2021-11-09 ENCOUNTER — Other Ambulatory Visit: Payer: Self-pay

## 2021-11-09 ENCOUNTER — Ambulatory Visit (INDEPENDENT_AMBULATORY_CARE_PROVIDER_SITE_OTHER): Payer: 59 | Admitting: *Deleted

## 2021-11-09 DIAGNOSIS — Z5181 Encounter for therapeutic drug level monitoring: Secondary | ICD-10-CM

## 2021-11-09 DIAGNOSIS — I4891 Unspecified atrial fibrillation: Secondary | ICD-10-CM | POA: Diagnosis not present

## 2021-11-09 DIAGNOSIS — I824Y2 Acute embolism and thrombosis of unspecified deep veins of left proximal lower extremity: Secondary | ICD-10-CM

## 2021-11-09 LAB — POCT INR: INR: 1.3 — AB (ref 2.0–3.0)

## 2021-11-09 NOTE — Patient Instructions (Addendum)
Description   Today another 1/2 tablet today and take 1.5 tablets tomorrow then start taking warfarin 1 tablet daily. Recheck INR in 1 week. [facility] [phone]

## 2021-11-16 ENCOUNTER — Ambulatory Visit: Payer: 59 | Admitting: *Deleted

## 2021-11-16 ENCOUNTER — Other Ambulatory Visit: Payer: Self-pay

## 2021-11-16 DIAGNOSIS — I824Y2 Acute embolism and thrombosis of unspecified deep veins of left proximal lower extremity: Secondary | ICD-10-CM

## 2021-11-16 DIAGNOSIS — Z5181 Encounter for therapeutic drug level monitoring: Secondary | ICD-10-CM

## 2021-11-16 DIAGNOSIS — I4891 Unspecified atrial fibrillation: Secondary | ICD-10-CM

## 2021-11-16 LAB — POCT INR: INR: 2.7 (ref 2.0–3.0)

## 2021-11-16 NOTE — Patient Instructions (Signed)
Description   Continue taking warfarin 1 tablet daily. Recheck INR in 2 weeks. [facility] [phone].      

## 2021-11-28 ENCOUNTER — Ambulatory Visit (HOSPITAL_COMMUNITY)
Admission: RE | Admit: 2021-11-28 | Discharge: 2021-11-28 | Disposition: A | Discharge status: Home / Self Care | Payer: 59 | Source: Ambulatory Visit | Attending: Cardiology | Admitting: Cardiology

## 2021-11-28 ENCOUNTER — Other Ambulatory Visit: Payer: Self-pay

## 2021-11-28 DIAGNOSIS — E785 Hyperlipidemia, unspecified: Secondary | ICD-10-CM | POA: Insufficient documentation

## 2021-11-28 LAB — LIPID PANEL
Cholesterol: 103 mg/dL (ref 0–200)
HDL: 25 mg/dL — ABNORMAL LOW (ref >40)
LDL Cholesterol: 8 mg/dL (ref 0–99)
Total CHOL/HDL Ratio: 4.1 RATIO
Triglycerides: 350 mg/dL — ABNORMAL HIGH (ref <150)
VLDL: 70 mg/dL — ABNORMAL HIGH (ref 0–40)

## 2021-11-29 ENCOUNTER — Encounter (HOSPITAL_COMMUNITY): Payer: Self-pay

## 2021-11-29 MED ORDER — FENOFIBRATE 48 MG PO TABS: 145.0000 mg | ORAL_TABLET | Freq: Every day | ORAL | 11 refills | Status: DC

## 2021-11-29 NOTE — Telephone Encounter (Signed)
-----   Message from Laurey Morale, MD sent at 11/28/2021  8:25 PM EST ----- ?Triglycerides still high.  Make sure he is taking Vascepa 2 g bid and increase fenofibrate to 145 mg daily. Lipids 2 months.  ?

## 2021-11-30 ENCOUNTER — Other Ambulatory Visit: Payer: Self-pay

## 2021-11-30 ENCOUNTER — Ambulatory Visit (INDEPENDENT_AMBULATORY_CARE_PROVIDER_SITE_OTHER): Payer: 59 | Admitting: *Deleted

## 2021-11-30 DIAGNOSIS — I4891 Unspecified atrial fibrillation: Secondary | ICD-10-CM | POA: Diagnosis not present

## 2021-11-30 DIAGNOSIS — Z5181 Encounter for therapeutic drug level monitoring: Secondary | ICD-10-CM | POA: Diagnosis not present

## 2021-11-30 DIAGNOSIS — I824Y2 Acute embolism and thrombosis of unspecified deep veins of left proximal lower extremity: Secondary | ICD-10-CM

## 2021-11-30 LAB — POCT INR: INR: 1.9 — AB (ref 2.0–3.0)

## 2021-11-30 NOTE — Patient Instructions (Signed)
Description   °Today take 1.5 tablets then continue taking warfarin 1 tablet daily. Recheck INR in 3 weeks. [facility] [phone].  °  °  °

## 2021-12-21 ENCOUNTER — Ambulatory Visit (INDEPENDENT_AMBULATORY_CARE_PROVIDER_SITE_OTHER): Payer: 59

## 2021-12-21 DIAGNOSIS — I824Y2 Acute embolism and thrombosis of unspecified deep veins of left proximal lower extremity: Secondary | ICD-10-CM

## 2021-12-21 DIAGNOSIS — I4891 Unspecified atrial fibrillation: Secondary | ICD-10-CM

## 2021-12-21 DIAGNOSIS — Z5181 Encounter for therapeutic drug level monitoring: Secondary | ICD-10-CM

## 2021-12-21 LAB — POCT INR: INR: 1.8 — AB (ref 2.0–3.0)

## 2021-12-21 NOTE — Patient Instructions (Signed)
Description   ?START taking warfarin 1 tablet daily except 1.5 tablets on Sundays and Wednesdays.  ?Recheck INR in 3 weeks.  ?[facility] [phone] ?  ?   ?

## 2022-01-06 ENCOUNTER — Ambulatory Visit (INDEPENDENT_AMBULATORY_CARE_PROVIDER_SITE_OTHER): Payer: 59

## 2022-01-06 DIAGNOSIS — I5022 Chronic systolic (congestive) heart failure: Secondary | ICD-10-CM

## 2022-01-06 LAB — CUP PACEART REMOTE DEVICE CHECK
Battery Remaining Longevity: 180 mo
Battery Remaining Percentage: 100 %
Brady Statistic RV Percent Paced: 0 %
Date Time Interrogation Session: 20230414040100
HighPow Impedance: 76 Ohm
Implantable Lead Implant Date: 20220413
Implantable Lead Location: 753860
Implantable Lead Model: 183
Implantable Lead Serial Number: 303520
Implantable Pulse Generator Implant Date: 20220413
Lead Channel Impedance Value: 599 Ohm
Lead Channel Pacing Threshold Amplitude: 0.6 V
Lead Channel Pacing Threshold Pulse Width: 0.4 ms
Lead Channel Setting Pacing Amplitude: 2 V
Lead Channel Setting Pacing Pulse Width: 0.4 ms
Lead Channel Setting Sensing Sensitivity: 0.5 mV
Pulse Gen Serial Number: 212369

## 2022-01-08 ENCOUNTER — Other Ambulatory Visit: Payer: Self-pay | Admitting: Cardiology

## 2022-01-09 NOTE — Telephone Encounter (Signed)
Request for warfarin refill: ?Last INR was 1.8 on 12/21/21 ?Next INR due on 01/11/22 ?Last Office Visit 10/18/21  Einar Crow MD ?Refill approved ?

## 2022-01-17 ENCOUNTER — Ambulatory Visit (HOSPITAL_COMMUNITY)
Admission: RE | Admit: 2022-01-17 | Discharge: 2022-01-17 | Disposition: A | Discharge status: Home / Self Care | Payer: 59 | Source: Ambulatory Visit | Attending: Cardiology | Admitting: Cardiology

## 2022-01-17 ENCOUNTER — Ambulatory Visit (HOSPITAL_BASED_OUTPATIENT_CLINIC_OR_DEPARTMENT_OTHER)
Admission: RE | Admit: 2022-01-17 | Discharge: 2022-01-17 | Disposition: A | Discharge status: Home / Self Care | Payer: 59 | Source: Ambulatory Visit | Attending: Cardiology | Admitting: Cardiology

## 2022-01-17 ENCOUNTER — Encounter (HOSPITAL_COMMUNITY): Payer: Self-pay | Admitting: Cardiology

## 2022-01-17 ENCOUNTER — Other Ambulatory Visit: Payer: Self-pay

## 2022-01-17 VITALS — BP 90/62 | HR 83 | Wt 163.0 lb

## 2022-01-17 DIAGNOSIS — I4891 Unspecified atrial fibrillation: Secondary | ICD-10-CM | POA: Diagnosis not present

## 2022-01-17 DIAGNOSIS — I5022 Chronic systolic (congestive) heart failure: Secondary | ICD-10-CM | POA: Diagnosis not present

## 2022-01-17 DIAGNOSIS — Z7984 Long term (current) use of oral hypoglycemic drugs: Secondary | ICD-10-CM | POA: Insufficient documentation

## 2022-01-17 DIAGNOSIS — Z952 Presence of prosthetic heart valve: Secondary | ICD-10-CM | POA: Insufficient documentation

## 2022-01-17 DIAGNOSIS — Z79899 Other long term (current) drug therapy: Secondary | ICD-10-CM | POA: Diagnosis not present

## 2022-01-17 DIAGNOSIS — I2510 Atherosclerotic heart disease of native coronary artery without angina pectoris: Secondary | ICD-10-CM | POA: Insufficient documentation

## 2022-01-17 DIAGNOSIS — Z7901 Long term (current) use of anticoagulants: Secondary | ICD-10-CM | POA: Insufficient documentation

## 2022-01-17 DIAGNOSIS — E785 Hyperlipidemia, unspecified: Secondary | ICD-10-CM | POA: Diagnosis not present

## 2022-01-17 DIAGNOSIS — Z951 Presence of aortocoronary bypass graft: Secondary | ICD-10-CM | POA: Insufficient documentation

## 2022-01-17 DIAGNOSIS — I2582 Chronic total occlusion of coronary artery: Secondary | ICD-10-CM | POA: Insufficient documentation

## 2022-01-17 DIAGNOSIS — I252 Old myocardial infarction: Secondary | ICD-10-CM | POA: Insufficient documentation

## 2022-01-17 DIAGNOSIS — I340 Nonrheumatic mitral (valve) insufficiency: Secondary | ICD-10-CM | POA: Diagnosis not present

## 2022-01-17 DIAGNOSIS — I97190 Other postprocedural cardiac functional disturbances following cardiac surgery: Secondary | ICD-10-CM | POA: Insufficient documentation

## 2022-01-17 DIAGNOSIS — Z7982 Long term (current) use of aspirin: Secondary | ICD-10-CM | POA: Insufficient documentation

## 2022-01-17 DIAGNOSIS — E781 Pure hyperglyceridemia: Secondary | ICD-10-CM | POA: Insufficient documentation

## 2022-01-17 DIAGNOSIS — I110 Hypertensive heart disease with heart failure: Secondary | ICD-10-CM | POA: Diagnosis present

## 2022-01-17 DIAGNOSIS — Z95810 Presence of automatic (implantable) cardiac defibrillator: Secondary | ICD-10-CM | POA: Diagnosis not present

## 2022-01-17 LAB — BASIC METABOLIC PANEL
Anion gap: 9 (ref 5–15)
BUN: 24 mg/dL — ABNORMAL HIGH (ref 6–20)
CO2: 28 mmol/L (ref 22–32)
Calcium: 9.8 mg/dL (ref 8.9–10.3)
Chloride: 100 mmol/L (ref 98–111)
Creatinine, Ser: 1.25 mg/dL — ABNORMAL HIGH (ref 0.61–1.24)
GFR, Estimated: >60 mL/min (ref >60)
Glucose, Bld: 123 mg/dL — ABNORMAL HIGH (ref 70–99)
Potassium: 4.6 mmol/L (ref 3.5–5.1)
Sodium: 137 mmol/L (ref 135–145)

## 2022-01-17 LAB — ECHOCARDIOGRAM COMPLETE
AR max vel: 3.72 cm2
AV Peak grad: 3.2 mmHg
Ao pk vel: 0.9 m/s
Area-P 1/2: 6.54 cm2
Calc EF: 29.5 %
MV VTI: 1.86 cm2
S' Lateral: 4.4 cm
Single Plane A2C EF: 29.2 %
Single Plane A4C EF: 29.4 %

## 2022-01-17 LAB — LIPID PANEL
Cholesterol: 106 mg/dL (ref 0–200)
HDL: 23 mg/dL — ABNORMAL LOW (ref >40)
LDL Cholesterol: 44 mg/dL (ref 0–99)
Total CHOL/HDL Ratio: 4.6 RATIO
Triglycerides: 196 mg/dL — ABNORMAL HIGH (ref <150)
VLDL: 39 mg/dL (ref 0–40)

## 2022-01-17 LAB — DIGOXIN LEVEL: Digoxin Level: 0.5 ng/mL — ABNORMAL LOW (ref 0.8–2.0)

## 2022-01-17 MED ORDER — PERFLUTREN LIPID MICROSPHERE
1.0000 mL | INTRAVENOUS | Status: DC | PRN
  Administered 2022-01-17: 2 mL via INTRAVENOUS
  Filled 2022-01-17: qty 10

## 2022-01-17 NOTE — Patient Instructions (Signed)
There has been no changes to your medications. ? ?Labs done today, your results will be available in MyChart, we will contact you for abnormal readings. ? ? ?Your physician recommends that you schedule a follow-up appointment in: 3 months.   ? ?If you have any questions or concerns before your next appointment please send us a message through mychart or call our office at [phone].   ? ?TO LEAVE A MESSAGE FOR THE NURSE SELECT OPTION 2, PLEASE LEAVE A MESSAGE INCLUDING: ?YOUR NAME ?DATE OF BIRTH ?CALL BACK NUMBER ?REASON FOR CALL**this is important as we prioritize the call backs ? ?YOU WILL RECEIVE A CALL BACK THE SAME DAY AS LONG AS YOU CALL BEFORE 4:00 PM ? ?At the [facility], you and your health needs are our priority. As part of our continuing mission to provide you with exceptional heart care, we have created designated Provider Care Teams. These Care Teams include your primary Cardiologist (physician) and Advanced Practice Providers (APPs- Physician Assistants and Nurse Practitioners) who all work together to provide you with the care you need, when you need it.  ? ?You may see any of the following providers on your designated Care Team at your next follow up: ?Dr Daniel Bensimhon ?Dr Dalton McLean ?Amy Clegg, NP ?Brittainy Simmons, PA ?Jessica Milford,NP ?Lindsay Finch, PA ?Lauren Kemp, PharmD ? ? ?Please be sure to bring in all your medications bottles to every appointment.  ? ? ?

## 2022-01-18 NOTE — Addendum Note (Signed)
Encounter addended by: Laurey Morale, MD on: 01/18/2022 12:07 AM ? Actions taken: Level of Service modified

## 2022-01-18 NOTE — Progress Notes (Signed)
?[facility] Note  ?PCP: Kathyrn Lass, MD ?HF Cardiology: Dr. Aundra Dubin  ?CT Surgery: Dr. Roxan Hockey  ? ?51 y.o. male s/p back surgery 05/2020 was admitted for acute inferior and anterolateral MI on 06/25/20. LHC showed thrombotic occlusion of distal RCA that was treated with PTCA and thrombectomy.  He was also noted to have chronic total occlusion of the LAD with collaterals from the right (thus this territory was affected by the RCA MI) as well as 90% complex proximal LCx stenosis. EF 25-30% + apical thrombus and severe mitral regurgitation. RV normal. Also w/ post MI pericarditis, treated w/ colchicine. Course further c/b cardiogenic shock and acute hypoxic respiratory failure w/ component of septic shock 2/2 PNA, requiring intubation and treatment w/ abx. Once stabilized, he underwent CABG x 3 (LIMA-LAD, Lt radial-OM1 and sequential SVG-PDA/PLA) + MV repair and placement of Impella 5.5 on 10/13 by Dr. Roxan Hockey.  ? ?Post operatively, he developed tamponade and was taken back to the OR on 10/14 with large hematoma obstructing right atrium. He improved and was extubated 10/19.  Impella removal 10/21 w/ stable hemodynamics and able to tolerate initiation of GDMT.  Also had post-operative afib treated w/ amiodarone. He was discharged home on 07/21/20.  ? ?Echo in 3/22 showed EF 20-25% with diffuse hypokinesis, mildly decreased RV systolic function, no LV thrombus, normal IVC, s/p MV repair with trivial MR and mild stenosis (mean gradient 4 mmHg).  [place] placed in 4/22.  Later in 4/22, he had L4-L5 spinal fusion.  ? ?CPX in 10/22 showed mild-moderate HF limitation. Echo was done today and reviewed, EF 25-30%, no LV thrombus, normal RV.  ? ?He presents to clinic today for followup of CHF. 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  He is very tired by the end of the week and sleeps a lot on the weekends.  He has generalized fatigue.  No  dyspnea with his usual activities.  No chest pain.  No orthopnea/PND.  Weight down 3 lbs. SBP in 90s generally but no lightheadedness.  ? ?Development worker, international aid: Heartlogic score 8 ? ?ECG (personally reviewed): NSR, old inferior and anterior MI.  ? ?Labs (11/21): LDL 59, HDL 34 ?Labs (1/22): digoxin level 1.2 ?Labs (2/22): K 4.3, creatinine 1.1 ?Labs (4/22): K 5, creatinine 1.02, hgb 11.8 ?Labs (5/22): K 4.1, creatinine 0.88, hgb 15.5, digoxin 0.4 ?Labs (9/22): LDL 34, TGs 315 ?Labs (11/22): K 3.6, creatinine 1.22 => 1.77 ?Labs (3/23): TGs 350, LDL 8 ? ?Review of systems complete and found to be negative unless listed in HPI.   ? ?PMH: ?1. Anxiety ?2. Hyperlipidemia ?3. CAD: Inferior and anterolateral MI 10/21.  LHC showed thrombotic occlusion distal RCA treated with PTCA/thrombectomy, CTO LAD with R=>L collaterals, 90% complex proximal LCx stenosis.  ?- CABG x 4 in 10/21 with LIMA-LAD, left radial-OM1, sequential SVG-PDA/PLV.  ?4. Mitral regurgitation: Suspect infarct-related MR.   ?- Mitral valve repair in 10/21 with CABG.  ?5. LV thrombus  ?6. Chronic systolic CHF: ischemic cardiomyopathy.  [place].  Echo (10/21) with EF 25-30%, apical thrombus, severe MR, normal RV.   ?- Echo (3/22): EF 20-25% with diffuse hypokinesis, mildly decreased RV systolic function, no LV thrombus, normal IVC, s/p MV repair with trivial MR and mild stenosis (mean gradient 4 mmHg).   ?- CPX (10/22): RER 1.17, VE/VCO2 slope 31, peak VO2 22.7 => mild-moderate HF limitation.  ?- Echo (4/23): EF 25-30%, no LV  thrombus, normal RV. ?7. Post-infarct pericarditis in 10/21. ?8. Atrial fibrillation: Post-op in 10/21.  ?9. H/o back surgery 9/21: Discectomy.   ? ? ?Current Outpatient Medications  ?Medication Sig Dispense Refill  ? acetaminophen (TYLENOL) 325 MG tablet Take 2 tablets (650 mg total) by mouth every 6 (six) hours as needed for mild pain.    ? ALPRAZolam (XANAX) 1 MG tablet Take 1 mg by mouth at bedtime as  needed for anxiety.    ? aspirin EC 81 MG EC tablet Take 1 tablet (81 mg total) by mouth daily. Swallow whole. 30 tablet 11  ? atorvastatin (LIPITOR) 80 MG tablet Take 1 tablet (80 mg total) by mouth daily. 90 tablet 3  ? carvedilol (COREG) 6.25 MG tablet TAKE 1 TABLET(6.25 MG) BY MOUTH TWICE DAILY WITH A MEAL 60 tablet 11  ? digoxin (LANOXIN) 0.125 MG tablet TAKE 1/2 TABLET BY MOUTH DAILY 15 tablet 11  ? icosapent Ethyl (VASCEPA) 1 g capsule Take 2 capsules (2 g total) by mouth 2 (two) times daily. 120 capsule 11  ? JARDIANCE 10 MG TABS tablet TAKE 1 TABLET(10 MG) BY MOUTH DAILY BEFORE BREAKFAST 30 tablet 11  ? linaclotide (LINZESS) 145 MCG CAPS capsule Take 145 mcg by mouth daily before breakfast.    ? Multiple Vitamin (MULTIVITAMIN WITH MINERALS) TABS tablet Take 1 tablet by mouth in the morning.    ? sacubitril-valsartan (ENTRESTO) 49-51 MG Take 1 tablet by mouth 2 (two) times daily. 60 tablet 6  ? spironolactone (ALDACTONE) 25 MG tablet TAKE 1 TABLET(25 MG) BY MOUTH AT BEDTIME 90 tablet 3  ? torsemide (DEMADEX) 20 MG tablet Take 20 mg by mouth daily. Takes additional tablets if weight gain    ? traZODone (DESYREL) 100 MG tablet Take 100 mg by mouth at bedtime.    ? warfarin (COUMADIN) 2.5 MG tablet TAKE 1 TO 1 1/2 TABLETS BY MOUTH DAILY AS DIRECTED BY COAGULATION CLINIC 40 tablet 3  ? ?No current facility-administered medications for this encounter.  ? ? ?Allergies  ?Allergen Reactions  ? Fenofibrate Other (See Comments)  ?  GI upset, Joint pain   ? ? ?  ?Social History  ? ?Socioeconomic History  ? Marital status: Single  ?  Spouse name: Not on file  ? Number of children: Not on file  ? Years of education: Not on file  ? Highest education level: Not on file  ?Occupational History  ?  Employer: [employer]  ?Tobacco Use  ? Smoking status: Never  ? Smokeless tobacco: Never  ?Vaping Use  ? Vaping Use: Never used  ?Substance and Sexual Activity  ? Alcohol use: Yes  ?  Alcohol/week: 1.0 standard drink  ?   Types: 1 Standard drinks or equivalent per week  ? Drug use: No  ? Sexual activity: Yes  ?  Birth control/protection: None  ?Other Topics Concern  ? Not on file  ?Social History Narrative  ? Not on file  ? ?Social Determinants of Health  ? ?Financial Resource Strain: Not on file  ?Food Insecurity: Not on file  ?Transportation Needs: Not on file  ?Physical Activity: Not on file  ?Stress: Not on file  ?Social Connections: Not on file  ?Intimate Partner Violence: Not on file  ? ? ?FH: No family history of premature CAD.  ? ?Vitals:  ? 01/17/22 1107  ?BP: 90/62  ?Pulse: 83  ?SpO2: 95%  ?Weight: 73.9 kg (163 lb)  ? ? ?PHYSICAL EXAM: ?General: NAD ?Neck: No JVD, no  thyromegaly or thyroid nodule.  ?Lungs: Clear to auscultation bilaterally with normal respiratory effort. ?CV: Nondisplaced PMI.  Heart regular S1/S2, no S3/S4, no murmur.  No peripheral edema.  No carotid bruit.  Normal pedal pulses.  ?Abdomen: Soft, nontender, no hepatosplenomegaly, no distention.  ?Skin: Intact without lesions or rashes.  ?Neurologic: Alert and oriented x 3.  ?Psych: Normal affect. ?Extremities: No clubbing or cyanosis.  ?HEENT: Normal.  ? ?ASSESSMENT & PLAN: ? ?1. CAD: Late presentation inferior and anterolateral MI 10/21.  Cath showed thrombotic occlusion distal RCA, CTO LAD with collaterals from right, and complex 90% proximal LCx stenosis.  He had PTCA/thrombectomy RCA with good flow at end of procedure.  Suspect he had damage to LAD territory as well as RCA territory as RCA collateralized the LAD.  S/p CABG w/ impella support with LIMA-LAD, left radial to OM, seq SVG-PDA/PLV. Post op course c/b tamponade and was taken back to the OR on 10/14 for drainage, found to have large hematoma obstructing right atrium.  No chest pain.  ?- Continue ASA 81 daily.  ?- Continue atorvastatin 80 mg daily. Check lipids today.  ?2. 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 Echo in 10/21 with EF 25-30%.   Echo in 3/22 showed EF 20-25%, diffuse hypokinesis, mildly decreased RV systolic function.  He now has a [place].  CPX in 10/22 with mild-moderate HF limitation.  Echo today showed EF 25-30%

## 2022-01-23 ENCOUNTER — Other Ambulatory Visit: Payer: Self-pay | Admitting: Cardiology

## 2022-01-24 NOTE — Progress Notes (Signed)
Remote ICD transmission.   

## 2022-01-25 ENCOUNTER — Ambulatory Visit (INDEPENDENT_AMBULATORY_CARE_PROVIDER_SITE_OTHER): Payer: 59 | Admitting: *Deleted

## 2022-01-25 DIAGNOSIS — I824Y2 Acute embolism and thrombosis of unspecified deep veins of left proximal lower extremity: Secondary | ICD-10-CM

## 2022-01-25 DIAGNOSIS — Z5181 Encounter for therapeutic drug level monitoring: Secondary | ICD-10-CM | POA: Diagnosis not present

## 2022-01-25 DIAGNOSIS — I4891 Unspecified atrial fibrillation: Secondary | ICD-10-CM | POA: Diagnosis not present

## 2022-01-25 LAB — POCT INR: INR: 2.8 (ref 2.0–3.0)

## 2022-01-25 NOTE — Patient Instructions (Signed)
Description   ?Continue taking warfarin 1 tablet daily except 1.5 tablets on Sundays and Wednesdays.  ?Recheck INR in 4 weeks. [facility] [phone] ?  ?  ?

## 2022-02-23 ENCOUNTER — Ambulatory Visit (INDEPENDENT_AMBULATORY_CARE_PROVIDER_SITE_OTHER): Payer: 59

## 2022-02-23 DIAGNOSIS — Z5181 Encounter for therapeutic drug level monitoring: Secondary | ICD-10-CM

## 2022-02-23 DIAGNOSIS — I824Y2 Acute embolism and thrombosis of unspecified deep veins of left proximal lower extremity: Secondary | ICD-10-CM

## 2022-02-23 DIAGNOSIS — I4891 Unspecified atrial fibrillation: Secondary | ICD-10-CM | POA: Diagnosis not present

## 2022-02-23 LAB — POCT INR: INR: 2.6 (ref 2.0–3.0)

## 2022-02-23 NOTE — Telephone Encounter (Signed)
This encounter was created in error - please disregard.

## 2022-02-23 NOTE — Patient Instructions (Signed)
Description   Continue taking warfarin 1 tablet daily except 1.5 tablets on Sundays and Wednesdays.  Recheck INR in 5 weeks.  [facility] [phone]

## 2022-03-15 ENCOUNTER — Other Ambulatory Visit: Payer: Self-pay | Admitting: Neurological Surgery

## 2022-03-15 ENCOUNTER — Other Ambulatory Visit (HOSPITAL_COMMUNITY): Payer: Self-pay | Admitting: Neurological Surgery

## 2022-03-15 DIAGNOSIS — M5416 Radiculopathy, lumbar region: Secondary | ICD-10-CM

## 2022-03-16 ENCOUNTER — Other Ambulatory Visit (HOSPITAL_COMMUNITY): Payer: Self-pay | Admitting: Cardiology

## 2022-03-30 ENCOUNTER — Ambulatory Visit (INDEPENDENT_AMBULATORY_CARE_PROVIDER_SITE_OTHER): Payer: 59

## 2022-03-30 DIAGNOSIS — Z5181 Encounter for therapeutic drug level monitoring: Secondary | ICD-10-CM | POA: Diagnosis not present

## 2022-03-30 DIAGNOSIS — I824Y2 Acute embolism and thrombosis of unspecified deep veins of left proximal lower extremity: Secondary | ICD-10-CM | POA: Diagnosis not present

## 2022-03-30 DIAGNOSIS — I4891 Unspecified atrial fibrillation: Secondary | ICD-10-CM

## 2022-03-30 LAB — POCT INR: INR: 2.8 (ref 2.0–3.0)

## 2022-03-30 NOTE — Patient Instructions (Signed)
Continue taking warfarin 1 tablet daily except 1.5 tablets on Sundays and Wednesdays.  Recheck INR in 5 weeks.  [facility] [phone]

## 2022-04-07 ENCOUNTER — Ambulatory Visit (INDEPENDENT_AMBULATORY_CARE_PROVIDER_SITE_OTHER): Payer: 59

## 2022-04-07 DIAGNOSIS — I255 Ischemic cardiomyopathy: Secondary | ICD-10-CM | POA: Diagnosis not present

## 2022-04-07 LAB — CUP PACEART REMOTE DEVICE CHECK
Battery Remaining Longevity: 174 mo
Battery Remaining Percentage: 100 %
Brady Statistic RV Percent Paced: 0 %
Date Time Interrogation Session: 20230714103100
HighPow Impedance: 82 Ohm
Implantable Lead Implant Date: 20220413
Implantable Lead Location: 753860
Implantable Lead Model: 183
Implantable Lead Serial Number: 303520
Implantable Pulse Generator Implant Date: 20220413
Lead Channel Impedance Value: 616 Ohm
Lead Channel Pacing Threshold Amplitude: 0.6 V
Lead Channel Pacing Threshold Pulse Width: 0.4 ms
Lead Channel Setting Pacing Amplitude: 2 V
Lead Channel Setting Pacing Pulse Width: 0.4 ms
Lead Channel Setting Sensing Sensitivity: 0.5 mV
Pulse Gen Serial Number: 212369

## 2022-04-17 NOTE — Progress Notes (Incomplete)
Advanced Heart Failure Clinic Note  PCP: Sigmund Hazel, MD HF Cardiology: Dr. Shirlee Latch  CT Surgery: Dr. Dorris Fetch   51 y.o. male s/p back surgery 05/2020 was admitted for acute inferior and anterolateral MI on 06/25/20. LHC showed thrombotic occlusion of distal RCA that was treated with PTCA and thrombectomy.  He was also noted to have chronic total occlusion of the LAD with collaterals from the right (thus this territory was affected by the RCA MI) as well as 90% complex proximal LCx stenosis. EF 25-30% + apical thrombus and severe mitral regurgitation. RV normal. Also w/ post MI pericarditis, treated w/ colchicine. Course further c/b cardiogenic shock and acute hypoxic respiratory failure w/ component of septic shock 2/2 PNA, requiring intubation and treatment w/ abx. Once stabilized, he underwent CABG x 3 (LIMA-LAD, Lt radial-OM1 and sequential SVG-PDA/PLA) + MV repair and placement of Impella 5.5 on 10/13 by Dr. Dorris Fetch.   Post operatively, he developed tamponade and was taken back to the OR on 10/14 with large hematoma obstructing right atrium. He improved and was extubated 10/19.  Impella removal 10/21 w/ stable hemodynamics and able to tolerate initiation of GDMT.  Also had post-operative afib treated w/ amiodarone. He was discharged home on 07/21/20.   Echo in 3/22 showed EF 20-25% with diffuse hypokinesis, mildly decreased RV systolic function, no LV thrombus, normal IVC, s/p MV repair with trivial MR and mild stenosis (mean gradient 4 mmHg).  [employer] ICD placed in 4/22.  Later in 4/22, he had L4-L5 spinal fusion.   CPX in 10/22 showed mild-moderate HF limitation. Echo was done today and reviewed, EF 25-30%, no LV thrombus, normal RV.   He presents to clinic today for followup of CHF. 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  He is very tired by the end of the week and sleeps a lot on the weekends.  He has generalized fatigue.  No  dyspnea with his usual activities.  No chest pain.  No orthopnea/PND.  Weight down 3 lbs. SBP in 90s generally but no lightheadedness.   [employer] device interrogation: Heartlogic score 8  ECG (personally reviewed): NSR, old inferior and anterior MI.   Labs (11/21): LDL 59, HDL 34 Labs (1/22): digoxin level 1.2 Labs (2/22): K 4.3, creatinine 1.1 Labs (4/22): K 5, creatinine 1.02, hgb 11.8 Labs (5/22): K 4.1, creatinine 0.88, hgb 15.5, digoxin 0.4 Labs (9/22): LDL 34, TGs 315 Labs (11/22): K 3.6, creatinine 1.22 => 1.77 Labs (3/23): TGs 350, LDL 8  Review of systems complete and found to be negative unless listed in HPI.    PMH: 1. Anxiety 2. Hyperlipidemia 3. CAD: Inferior and anterolateral MI 10/21.  LHC showed thrombotic occlusion distal RCA treated with PTCA/thrombectomy, CTO LAD with R=>L collaterals, 90% complex proximal LCx stenosis.  - CABG x 4 in 10/21 with LIMA-LAD, left radial-OM1, sequential SVG-PDA/PLV.  4. Mitral regurgitation: Suspect infarct-related MR.   - Mitral valve repair in 10/21 with CABG.  5. LV thrombus  6. Chronic systolic CHF: ischemic cardiomyopathy.  [employer] ICD.  Echo (10/21) with EF 25-30%, apical thrombus, severe MR, normal RV.   - Echo (3/22): EF 20-25% with diffuse hypokinesis, mildly decreased RV systolic function, no LV thrombus, normal IVC, s/p MV repair with trivial MR and mild stenosis (mean gradient 4 mmHg).   - CPX (10/22): RER 1.17, VE/VCO2 slope 31, peak VO2 22.7 => mild-moderate HF limitation.  - Echo (4/23): EF 25-30%, no LV  thrombus, normal RV. 7. Post-infarct pericarditis in 10/21. 8. Atrial fibrillation: Post-op in 10/21.  9. H/o back surgery 9/21: Discectomy.     Current Outpatient Medications  Medication Sig Dispense Refill   acetaminophen (TYLENOL) 325 MG tablet Take 2 tablets (650 mg total) by mouth every 6 (six) hours as needed for mild pain.     ALPRAZolam (XANAX) 1 MG tablet Take 1 mg by mouth at bedtime as  needed for anxiety.     aspirin EC 81 MG EC tablet Take 1 tablet (81 mg total) by mouth daily. Swallow whole. 30 tablet 11   atorvastatin (LIPITOR) 80 MG tablet Take 1 tablet (80 mg total) by mouth daily. 90 tablet 3   carvedilol (COREG) 6.25 MG tablet TAKE 1 TABLET(6.25 MG) BY MOUTH TWICE DAILY WITH A MEAL 60 tablet 11   digoxin (LANOXIN) 0.125 MG tablet TAKE 1/2 TABLET BY MOUTH DAILY 15 tablet 11   ENTRESTO 49-51 MG TAKE 1 TABLET BY MOUTH TWICE DAILY 60 tablet 11   icosapent Ethyl (VASCEPA) 1 g capsule Take 2 capsules (2 g total) by mouth 2 (two) times daily. 120 capsule 11   JARDIANCE 10 MG TABS tablet TAKE 1 TABLET(10 MG) BY MOUTH DAILY BEFORE BREAKFAST 30 tablet 11   linaclotide (LINZESS) 145 MCG CAPS capsule Take 145 mcg by mouth daily before breakfast.     Multiple Vitamin (MULTIVITAMIN WITH MINERALS) TABS tablet Take 1 tablet by mouth in the morning.     spironolactone (ALDACTONE) 25 MG tablet TAKE 1 TABLET(25 MG) BY MOUTH AT BEDTIME 90 tablet 3   torsemide (DEMADEX) 20 MG tablet Take 20 mg by mouth daily. Takes additional tablets if weight gain     traZODone (DESYREL) 100 MG tablet Take 100 mg by mouth at bedtime.     warfarin (COUMADIN) 2.5 MG tablet TAKE 1 TO 1 1/2 TABLETS BY MOUTH DAILY AS DIRECTED BY COAGULATION CLINIC 40 tablet 3   No current facility-administered medications for this visit.    Allergies  Allergen Reactions   Fenofibrate Other (See Comments)    GI upset, Joint pain       Social History   Socioeconomic History   Marital status: Single    Spouse name: Not on file   Number of children: Not on file   Years of education: Not on file   Highest education level: Not on file  Occupational History    Employer: [employer]  Tobacco Use   Smoking status: Never   Smokeless tobacco: Never  Vaping Use   Vaping Use: Never used  Substance and Sexual Activity   Alcohol use: Yes    Alcohol/week: 1.0 standard drink of alcohol    Types: 1 Standard drinks or  equivalent per week   Drug use: No   Sexual activity: Yes    Birth control/protection: None  Other Topics Concern   Not on file  Social History Narrative   Not on file   Social Determinants of Health   Financial Resource Strain: Not on file  Food Insecurity: Not on file  Transportation Needs: Not on file  Physical Activity: Not on file  Stress: Not on file  Social Connections: Not on file  Intimate Partner Violence: Not on file    FH: No family history of premature CAD.   There were no vitals filed for this visit.   PHYSICAL EXAM: General: NAD Neck: No JVD, no thyromegaly or thyroid nodule.  Lungs: Clear to auscultation bilaterally with normal respiratory effort.  CV: Nondisplaced PMI.  Heart regular S1/S2, no S3/S4, no murmur.  No peripheral edema.  No carotid bruit.  Normal pedal pulses.  Abdomen: Soft, nontender, no hepatosplenomegaly, no distention.  Skin: Intact without lesions or rashes.  Neurologic: Alert and oriented x 3.  Psych: Normal affect. Extremities: No clubbing or cyanosis.  HEENT: Normal.   ASSESSMENT & PLAN:  1. CAD: Late presentation inferior and anterolateral MI 10/21.  Cath showed thrombotic occlusion distal RCA, CTO LAD with collaterals from right, and complex 90% proximal LCx stenosis.  He had PTCA/thrombectomy RCA with good flow at end of procedure.  Suspect he had damage to LAD territory as well as RCA territory as RCA collateralized the LAD.  S/p CABG w/ impella support with LIMA-LAD, left radial to OM, seq SVG-PDA/PLV. Post op course c/b tamponade and was taken back to the OR on 10/14 for drainage, found to have large hematoma obstructing right atrium.  No chest pain.  - Continue ASA 81 daily.  - Continue atorvastatin 80 mg daily. Check lipids today.  2. 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 Echo in 10/21 with EF 25-30%.  Echo in 3/22 showed EF 20-25%, diffuse hypokinesis, mildly decreased RV  systolic function.  He now has a Environmental manager ICD.  CPX in 10/22 with mild-moderate HF limitation.  Echo today showed EF 25-30%, no LV thrombus, normal RV.  NYHA Class I-II symptoms though he does get fatigued, not volume overloaded on exam. HeartLogic 8 (not elevated). SBP 90s today.  - Continue Entresto 49/51 bid, unable to tolerate highest dose due to orthostatic symptoms.  - Continue Spironolactone 25 mg daily, take at night.  BMET today.   - Continue Coreg 6.25 mg bid, no BP room to increase.  - Continue Digoxin. Check level today.  - Continue Jardiance 10 mg daily.  - Continue torsemide 20 mg daily with extra 20 mg for weight gain/edema.  - Narrow QRS so not CRT candidate.    - He would likely be a baroreceptor activation therapy or vagal nerve stimulation candidate, not interested at this time though.  3. Severe MR: Suspect infarct-related MR. s/p MV repair at time of CABG 10/21.  Mild MR on 4/23 echo.  4. LV thrombus: Not present on today's echo.  - Continue warfarin.  5. Post-operative atrial fibrillation: He is in NSR today. He is on warfarin, off amiodarone.  6. Post-MI pericarditis: Now off colchicine.  7. Hypertriglyceridemia: Unable to tolerate fenofibrate due to GI upset.  Continue Vascepa, check lipids today.   Followup in 3 months with APP  Jacklynn Ganong, FNP 04/17/22

## 2022-04-18 ENCOUNTER — Ambulatory Visit (HOSPITAL_COMMUNITY)
Admission: RE | Admit: 2022-04-18 | Discharge: 2022-04-18 | Disposition: A | Discharge status: Home / Self Care | Payer: 59 | Source: Ambulatory Visit | Attending: Family Medicine | Admitting: Family Medicine

## 2022-04-18 ENCOUNTER — Encounter (HOSPITAL_COMMUNITY): Payer: Self-pay

## 2022-04-18 ENCOUNTER — Telehealth (HOSPITAL_COMMUNITY): Payer: Self-pay

## 2022-04-18 VITALS — BP 88/64 | HR 82 | Wt 153.4 lb

## 2022-04-18 DIAGNOSIS — Z7984 Long term (current) use of oral hypoglycemic drugs: Secondary | ICD-10-CM | POA: Diagnosis not present

## 2022-04-18 DIAGNOSIS — I255 Ischemic cardiomyopathy: Secondary | ICD-10-CM | POA: Diagnosis not present

## 2022-04-18 DIAGNOSIS — I5022 Chronic systolic (congestive) heart failure: Secondary | ICD-10-CM

## 2022-04-18 DIAGNOSIS — Z951 Presence of aortocoronary bypass graft: Secondary | ICD-10-CM | POA: Insufficient documentation

## 2022-04-18 DIAGNOSIS — I4891 Unspecified atrial fibrillation: Secondary | ICD-10-CM | POA: Diagnosis not present

## 2022-04-18 DIAGNOSIS — I2510 Atherosclerotic heart disease of native coronary artery without angina pectoris: Secondary | ICD-10-CM

## 2022-04-18 DIAGNOSIS — Z79899 Other long term (current) drug therapy: Secondary | ICD-10-CM | POA: Diagnosis not present

## 2022-04-18 DIAGNOSIS — Z9889 Other specified postprocedural states: Secondary | ICD-10-CM | POA: Diagnosis not present

## 2022-04-18 DIAGNOSIS — E781 Pure hyperglyceridemia: Secondary | ICD-10-CM | POA: Diagnosis not present

## 2022-04-18 DIAGNOSIS — Z7901 Long term (current) use of anticoagulants: Secondary | ICD-10-CM | POA: Diagnosis not present

## 2022-04-18 DIAGNOSIS — K30 Functional dyspepsia: Secondary | ICD-10-CM | POA: Diagnosis not present

## 2022-04-18 DIAGNOSIS — E785 Hyperlipidemia, unspecified: Secondary | ICD-10-CM

## 2022-04-18 DIAGNOSIS — I236 Thrombosis of atrium, auricular appendage, and ventricle as current complications following acute myocardial infarction: Secondary | ICD-10-CM

## 2022-04-18 DIAGNOSIS — I252 Old myocardial infarction: Secondary | ICD-10-CM | POA: Insufficient documentation

## 2022-04-18 DIAGNOSIS — Z7982 Long term (current) use of aspirin: Secondary | ICD-10-CM | POA: Diagnosis not present

## 2022-04-18 DIAGNOSIS — I480 Paroxysmal atrial fibrillation: Secondary | ICD-10-CM

## 2022-04-18 LAB — BASIC METABOLIC PANEL
Anion gap: 9 (ref 5–15)
BUN: 28 mg/dL — ABNORMAL HIGH (ref 6–20)
CO2: 26 mmol/L (ref 22–32)
Calcium: 9.8 mg/dL (ref 8.9–10.3)
Chloride: 102 mmol/L (ref 98–111)
Creatinine, Ser: 1.82 mg/dL — ABNORMAL HIGH (ref 0.61–1.24)
GFR, Estimated: 44 mL/min — ABNORMAL LOW (ref >60)
Glucose, Bld: 100 mg/dL — ABNORMAL HIGH (ref 70–99)
Potassium: 4.6 mmol/L (ref 3.5–5.1)
Sodium: 137 mmol/L (ref 135–145)

## 2022-04-18 MED ORDER — ENTRESTO 24-26 MG PO TABS: 1.0000 | ORAL_TABLET | Freq: Two times a day (BID) | ORAL | 11 refills | Status: DC

## 2022-04-18 NOTE — Telephone Encounter (Signed)
Patient's Medication has been changed and his med list has been updated. Labs has been placed and patient agreed to hold his digoxin RX morning of labs.  Pt aware, agreeable, and verbalized understanding

## 2022-04-18 NOTE — Telephone Encounter (Signed)
-----   Message from Jacklynn Ganong, [place] sent at 04/18/2022 12:20 PM EDT ----- Kidney function is significantly elevated. Decrease Entresto to 24/26.  Repeat BMET and get dig trough in 1 week (hold dig before labs).

## 2022-04-18 NOTE — Patient Instructions (Addendum)
Thank you for coming in today  Labs were done today, if any labs are abnormal the clinic will call you No news is good news  Your physician recommends that you schedule a follow-up appointment in:  3-4 months with Dr . Shirlee Latch please call in end of august September to make appointment HOLD DIGOXIN FOR NEXT VISIT     Do the following things EVERYDAY: Weigh yourself in the morning before breakfast. Write it down and keep it in a log. Take your medicines as prescribed Eat low salt foods--Limit salt (sodium) to 2000 mg per day.  Stay as active as you can everyday Limit all fluids for the day to less than 2 liters  At the [facility], you and your health needs are our priority. As part of our continuing mission to provide you with exceptional heart care, we have created designated Provider Care Teams. These Care Teams include your primary Cardiologist (physician) and Advanced Practice Providers (APPs- Physician Assistants and Nurse Practitioners) who all work together to provide you with the care you need, when you need it.   You may see any of the following providers on your designated Care Team at your next follow up: Dr Arvilla Meres Dr Carron Curie, NP Robbie Lis, [place] [facility] [place], [place] Karle Plumber, PharmD   Please be sure to bring in all your medications bottles to every appointment.   If you have any questions or concerns before your next appointment please send [place] a message through [place] or call our office at [phone].    TO LEAVE A MESSAGE FOR THE NURSE SELECT OPTION 2, PLEASE LEAVE A MESSAGE INCLUDING: YOUR NAME DATE OF BIRTH CALL BACK NUMBER REASON FOR CALL**this is important as we prioritize the call backs  YOU WILL RECEIVE A CALL BACK THE SAME DAY AS LONG AS YOU CALL BEFORE 4:00 PM

## 2022-04-20 NOTE — Progress Notes (Signed)
Remote ICD transmission.   

## 2022-04-21 ENCOUNTER — Other Ambulatory Visit: Payer: Self-pay | Admitting: Cardiovascular Disease

## 2022-04-21 DIAGNOSIS — I4891 Unspecified atrial fibrillation: Secondary | ICD-10-CM

## 2022-04-25 ENCOUNTER — Ambulatory Visit (HOSPITAL_COMMUNITY)
Admission: RE | Admit: 2022-04-25 | Discharge: 2022-04-25 | Disposition: A | Discharge status: Home / Self Care | Payer: 59 | Source: Ambulatory Visit | Attending: Cardiology | Admitting: Cardiology

## 2022-04-25 DIAGNOSIS — I5022 Chronic systolic (congestive) heart failure: Secondary | ICD-10-CM | POA: Insufficient documentation

## 2022-04-25 LAB — BASIC METABOLIC PANEL
Anion gap: 11 (ref 5–15)
BUN: 44 mg/dL — ABNORMAL HIGH (ref 6–20)
CO2: 23 mmol/L (ref 22–32)
Calcium: 9.5 mg/dL (ref 8.9–10.3)
Chloride: 101 mmol/L (ref 98–111)
Creatinine, Ser: 1.76 mg/dL — ABNORMAL HIGH (ref 0.61–1.24)
GFR, Estimated: 46 mL/min — ABNORMAL LOW (ref >60)
Glucose, Bld: 89 mg/dL (ref 70–99)
Potassium: 4.8 mmol/L (ref 3.5–5.1)
Sodium: 135 mmol/L (ref 135–145)

## 2022-04-25 LAB — DIGOXIN LEVEL: Digoxin Level: 0.5 ng/mL — ABNORMAL LOW (ref 0.8–2.0)

## 2022-05-04 ENCOUNTER — Ambulatory Visit (INDEPENDENT_AMBULATORY_CARE_PROVIDER_SITE_OTHER): Payer: 59 | Admitting: *Deleted

## 2022-05-04 DIAGNOSIS — I4891 Unspecified atrial fibrillation: Secondary | ICD-10-CM | POA: Diagnosis not present

## 2022-05-04 DIAGNOSIS — I824Y2 Acute embolism and thrombosis of unspecified deep veins of left proximal lower extremity: Secondary | ICD-10-CM

## 2022-05-04 DIAGNOSIS — Z5181 Encounter for therapeutic drug level monitoring: Secondary | ICD-10-CM

## 2022-05-04 LAB — POCT INR: INR: 2.5 (ref 2.0–3.0)

## 2022-05-04 NOTE — Patient Instructions (Signed)
Description   Continue taking warfarin 1 tablet daily except 1.5 tablets on Sundays and Wednesdays.  Recheck INR in 6 weeks. Coumadin Clinic [phone] or [phone]     

## 2022-05-05 ENCOUNTER — Ambulatory Visit (HOSPITAL_COMMUNITY)
Admission: RE | Admit: 2022-05-05 | Discharge: 2022-05-05 | Disposition: A | Discharge status: Home / Self Care | Payer: 59 | Source: Ambulatory Visit | Attending: Neurological Surgery | Admitting: Neurological Surgery

## 2022-05-05 DIAGNOSIS — M5416 Radiculopathy, lumbar region: Secondary | ICD-10-CM | POA: Diagnosis present

## 2022-05-05 NOTE — Progress Notes (Signed)
Informed of MRI for today.   Device system confirmed to be MRI conditional, with implant date > 6 weeks ago, and no evidence of abandoned or epicardial leads in review of most recent CXR Interrogation from today performed by industry rep who recommended OVO.   Change device settings for MRI to OVO  Tachy-therapies to off if applicable.  Program device back to pre-MRI settings after completion of exam.  Jerry [name]  05/05/2022 12:44 PM

## 2022-05-05 NOTE — Progress Notes (Signed)
Patient here today at [facility] for MRI lumbar spine wo contrast. Patient has [employer] ICD. Heart Connect with Joey D- Rep. Orders for OVO. Will re-program once scan is completed.

## 2022-05-12 ENCOUNTER — Other Ambulatory Visit: Payer: Self-pay | Admitting: Cardiology

## 2022-05-26 ENCOUNTER — Other Ambulatory Visit (HOSPITAL_COMMUNITY): Payer: Self-pay | Admitting: Cardiology

## 2022-05-26 DIAGNOSIS — I5022 Chronic systolic (congestive) heart failure: Secondary | ICD-10-CM

## 2022-05-26 NOTE — Progress Notes (Signed)
bmet  

## 2022-06-02 ENCOUNTER — Ambulatory Visit (HOSPITAL_COMMUNITY)
Admission: RE | Admit: 2022-06-02 | Discharge: 2022-06-02 | Disposition: A | Discharge status: Home / Self Care | Payer: 59 | Source: Ambulatory Visit | Attending: Internal Medicine | Admitting: Internal Medicine

## 2022-06-02 DIAGNOSIS — I5022 Chronic systolic (congestive) heart failure: Secondary | ICD-10-CM | POA: Insufficient documentation

## 2022-06-02 LAB — BASIC METABOLIC PANEL
Anion gap: 11 (ref 5–15)
BUN: 28 mg/dL — ABNORMAL HIGH (ref 6–20)
CO2: 26 mmol/L (ref 22–32)
Calcium: 9.2 mg/dL (ref 8.9–10.3)
Chloride: 101 mmol/L (ref 98–111)
Creatinine, Ser: 1.62 mg/dL — ABNORMAL HIGH (ref 0.61–1.24)
GFR, Estimated: 51 mL/min — ABNORMAL LOW (ref >60)
Glucose, Bld: 82 mg/dL (ref 70–99)
Potassium: 4 mmol/L (ref 3.5–5.1)
Sodium: 138 mmol/L (ref 135–145)

## 2022-06-08 ENCOUNTER — Other Ambulatory Visit (HOSPITAL_COMMUNITY): Payer: Self-pay

## 2022-06-08 MED ORDER — ATORVASTATIN CALCIUM 80 MG PO TABS: 80.0000 mg | ORAL_TABLET | Freq: Every day | ORAL | 1 refills | Status: DC

## 2022-06-15 ENCOUNTER — Ambulatory Visit: Payer: 59 | Attending: Cardiology | Admitting: *Deleted

## 2022-06-15 DIAGNOSIS — I824Y2 Acute embolism and thrombosis of unspecified deep veins of left proximal lower extremity: Secondary | ICD-10-CM

## 2022-06-15 DIAGNOSIS — I4891 Unspecified atrial fibrillation: Secondary | ICD-10-CM

## 2022-06-15 DIAGNOSIS — Z5181 Encounter for therapeutic drug level monitoring: Secondary | ICD-10-CM

## 2022-06-15 LAB — POCT INR: INR: 2.4 (ref 2.0–3.0)

## 2022-06-15 NOTE — Patient Instructions (Signed)
Description   Continue taking warfarin 1 tablet daily except 1.5 tablets on Sundays and Wednesdays.  Recheck INR in 6 weeks. Coumadin Clinic [phone] or [phone]

## 2022-06-20 ENCOUNTER — Telehealth (HOSPITAL_COMMUNITY): Payer: Self-pay

## 2022-06-20 MED ORDER — DIGOXIN 125 MCG PO TABS: 0.0625 mg | ORAL_TABLET | Freq: Every day | ORAL | 11 refills | Status: DC

## 2022-06-22 NOTE — Telephone Encounter (Signed)
error 

## 2022-07-03 ENCOUNTER — Ambulatory Visit: Payer: 59 | Attending: Internal Medicine | Admitting: Internal Medicine

## 2022-07-03 ENCOUNTER — Encounter: Payer: Self-pay | Admitting: Internal Medicine

## 2022-07-03 VITALS — BP 100/66 | HR 71 | Ht 68.0 in | Wt 147.8 lb

## 2022-07-03 DIAGNOSIS — I255 Ischemic cardiomyopathy: Secondary | ICD-10-CM

## 2022-07-03 DIAGNOSIS — Z95810 Presence of automatic (implantable) cardiac defibrillator: Secondary | ICD-10-CM | POA: Diagnosis not present

## 2022-07-03 LAB — CUP PACEART INCLINIC DEVICE CHECK
Date Time Interrogation Session: 20231009120821
HighPow Impedance: 70 Ohm
Implantable Lead Implant Date: 20220413
Implantable Lead Location: 753860
Implantable Lead Model: 183
Implantable Lead Serial Number: 303520
Implantable Pulse Generator Implant Date: 20220413
Lead Channel Impedance Value: 555 Ohm
Lead Channel Sensing Intrinsic Amplitude: 25 mV
Lead Channel Setting Pacing Amplitude: 2 V
Lead Channel Setting Pacing Pulse Width: 0.4 ms
Lead Channel Setting Sensing Sensitivity: 0.5 mV
Pulse Gen Serial Number: 212369

## 2022-07-03 NOTE — Patient Instructions (Signed)
Medication Instructions:  Your physician recommends that you continue on your current medications as directed. Please refer to the Current Medication list given to you today.  *If you need a refill on your cardiac medications before your next appointment, please call your pharmacy*   Lab Work: None ordered.  If you have labs (blood work) drawn today and your tests are completely normal, you will receive your results only by: MyChart Message (if you have MyChart) OR A paper copy in the mail If you have any lab test that is abnormal or we need to change your treatment, we will call you to review the results.   Testing/Procedures: None ordered.    Follow-Up: At [facility], you and your health needs are our priority.  As part of our continuing mission to provide you with exceptional heart care, we have created designated Provider Care Teams.  These Care Teams include your primary Cardiologist (physician) and Advanced Practice Providers (APPs -  Physician Assistants and Nurse Practitioners) who all work together to provide you with the care you need, when you need it.  We recommend signing up for the patient portal called "MyChart".  Sign up information is provided on this After Visit Summary.  MyChart is used to connect with patients for Virtual Visits (Telemedicine).  Patients are able to view lab/test results, encounter notes, upcoming appointments, etc.  Non-urgent messages can be sent to your provider as well.   To learn more about what you can do with MyChart, go to [email or web address].    Your next appointment:   12 months with Dr Klein  Important Information About Sugar       

## 2022-07-03 NOTE — Progress Notes (Signed)
Patient Care Team: Sigmund Hazel, MD as PCP - General (Family Medicine) Tonny Bollman, MD as PCP - Cardiology (Cardiology)   HPI  Jerry [name] is a 51 y.o. male seen in followup for ICD implanted 4/22 for primary prevention for ICM, prior impella supported  CABG with concomitant Mitral valve disease with ring; apical thrombus  The patient denies chest pain, shortness of breath, nocturnal dyspnea, orthopnea    There have been no palpitations, lightheadedness or syncope.   Biggest complaint is periodic fluid retention and he takes extra torsemide.    DATE TEST EF    10/21 LHC 25-30 % LAD-T (R>>L); CXp-90% RCAd-T>>POBA  10/21 Echo   * % MR mod  3/22 Echo  20-25%    4/23 Echo 25-30%     Date Cr K Dig Hgb  3/22 1.12 4.8 0.5 16.3  1/23    14.0   9/23 1.68 4.0 0.5        Records and Results Reviewed  Past Medical History:  Diagnosis Date   AICD (automatic cardioverter/defibrillator) present 01/05/2021   [employer]   Anxiety    Coronary artery disease    Essential (primary) hypertension 06/17/2020   Myocardial infarction (HCC)    Radiculopathy, lumbar region 11/14/2019    Past Surgical History:  Procedure Laterality Date   BACK SURGERY     CARDIAC CATHETERIZATION     CORONARY ANGIOPLASTY     CORONARY ARTERY BYPASS GRAFT N/A 07/07/2020   Procedure: CORONARY ARTERY BYPASS GRAFTING (CABG) x FOUR, USING LEFT RADIAL ARTERY AND RIGHT LEG GREATER SAPHENOUS VEIN HARVESTED ENDOSCOPICALLY;  Surgeon: Loreli Slot, MD;  Location: [facility] OR;  Service: Open Heart Surgery;  Laterality: N/A;   CORONARY BALLOON ANGIOPLASTY N/A 06/25/2020   Procedure: CORONARY BALLOON ANGIOPLASTY;  Surgeon: Tonny Bollman, MD;  Location: [facility] INVASIVE CV LAB;  Service: Cardiovascular;  Laterality: N/A;   CORONARY THROMBECTOMY N/A 06/25/2020   Procedure: Coronary Thrombectomy;  Surgeon: Tonny Bollman, MD;  Location: [facility] INVASIVE CV LAB;  Service: Cardiovascular;  Laterality: N/A;    CORONARY/GRAFT ACUTE MI REVASCULARIZATION N/A 06/26/2020   Procedure: Coronary/Graft Acute MI Revascularization;  Surgeon: Tonny Bollman, MD;  Location: [facility] INVASIVE CV LAB;  Service: Cardiovascular;  Laterality: N/A;   ENDOVEIN HARVEST OF GREATER SAPHENOUS VEIN Right 07/07/2020   Procedure: ENDOVEIN HARVEST OF GREATER SAPHENOUS VEIN;  Surgeon: Loreli Slot, MD;  Location: [facility] OR;  Service: Open Heart Surgery;  Laterality: Right;   EXPLORATION POST OPERATIVE OPEN HEART N/A 07/08/2020   Procedure: EXPLORATION POST OPERATIVE OPEN HEART TAMPONADE;  Surgeon: Loreli Slot, MD;  Location: [facility] OR;  Service: Open Heart Surgery;  Laterality: N/A;   FRACTURE SURGERY     ICD IMPLANT N/A 01/05/2021   Procedure: ICD IMPLANT;  Surgeon: Duke Salvia, MD;  Location: [facility] INVASIVE CV LAB;  Service: Cardiovascular;  Laterality: N/A;   LEFT HEART CATH AND CORONARY ANGIOGRAPHY N/A 06/25/2020   Procedure: LEFT HEART CATH AND CORONARY ANGIOGRAPHY;  Surgeon: Tonny Bollman, MD;  Location: [facility] INVASIVE CV LAB;  Service: Cardiovascular;  Laterality: N/A;   PLACEMENT OF IMPELLA LEFT VENTRICULAR ASSIST DEVICE Right 07/07/2020   Procedure: PLACEMENT OF IMPELLA LEFT VENTRICULAR ASSIST DEVICE USING ABIOMED IMPELLA 5.5;  Surgeon: Loreli Slot, MD;  Location: [facility] OR;  Service: Open Heart Surgery;  Laterality: Right;   PLACEMENT OF IMPELLA LEFT VENTRICULAR ASSIST DEVICE N/A 07/15/2020   Procedure: REMOVAL OF IMPELLA LEFT VENTRICULAR ASSIST DEVICE;  Surgeon: Loreli Slot, MD;  Location: [facility] OR;  Service: Open Heart Surgery;  Laterality: N/A;   RADIAL ARTERY HARVEST Left 07/07/2020   Procedure: RADIAL ARTERY HARVEST left;  Surgeon: Loreli Slot, MD;  Location: [facility] OR;  Service: Open Heart Surgery;  Laterality: Left;   right clavicle fracture reapir  2010   RIGHT HEART CATH N/A 06/26/2020   Procedure: RIGHT HEART CATH;  Surgeon: Tonny Bollman, MD;  Location: [facility] INVASIVE CV LAB;  Service:  Cardiovascular;  Laterality: N/A;   TEE WITHOUT CARDIOVERSION N/A 07/07/2020   Procedure: TRANSESOPHAGEAL ECHOCARDIOGRAM (TEE);  Surgeon: Loreli Slot, MD;  Location: [facility] OR;  Service: Open Heart Surgery;  Laterality: N/A;   TRANSFORAMINAL LUMBAR INTERBODY FUSION W/ MIS 1 LEVEL Right 01/06/2021   Procedure: Right Lumbar 4-5 Minimally invasive transforaminal lumbar interbody fusion;  Surgeon: Jadene Pierini, MD;  Location: [facility] OR;  Service: Neurosurgery;  Laterality: Right;  3C/RM 19    Current Meds  Medication Sig   acetaminophen (TYLENOL) 325 MG tablet Take 2 tablets (650 mg total) by mouth every 6 (six) hours as needed for mild pain.   ALPRAZolam (XANAX) 1 MG tablet Take 1 mg by mouth at bedtime as needed for anxiety.   aspirin EC 81 MG EC tablet Take 1 tablet (81 mg total) by mouth daily. Swallow whole.   atorvastatin (LIPITOR) 80 MG tablet Take 1 tablet (80 mg total) by mouth daily.   carvedilol (COREG) 6.25 MG tablet TAKE 1 TABLET(6.25 MG) BY MOUTH TWICE DAILY WITH A MEAL   digoxin (LANOXIN) 0.125 MG tablet Take 0.5 tablets (0.0625 mg total) by mouth daily.   icosapent Ethyl (VASCEPA) 1 g capsule Take 2 capsules (2 g total) by mouth 2 (two) times daily.   JARDIANCE 10 MG TABS tablet TAKE 1 TABLET(10 MG) BY MOUTH DAILY BEFORE BREAKFAST   linaclotide (LINZESS) 145 MCG CAPS capsule Take 145 mcg by mouth daily before breakfast.   Multiple Vitamin (MULTIVITAMIN WITH MINERALS) TABS tablet Take 1 tablet by mouth in the morning.   sacubitril-valsartan (ENTRESTO) 24-26 MG Take 1 tablet by mouth 2 (two) times daily.   spironolactone (ALDACTONE) 25 MG tablet TAKE 1 TABLET(25 MG) BY MOUTH AT BEDTIME   torsemide (DEMADEX) 20 MG tablet Take 20 mg by mouth daily. Takes additional tablets if weight gain   traZODone (DESYREL) 100 MG tablet Take 100 mg by mouth at bedtime.   warfarin (COUMADIN) 2.5 MG tablet TAKE 1 TO 1 AND 1/2 TABLETS BY MOUTH DAILY AS DIRECTED BY COAGULATION CLINIC     Allergies  Allergen Reactions   Fenofibrate Other (See Comments)    GI upset, Joint pain       Review of Systems negative except from HPI and PMH  Physical Exam BP 100/66   Pulse 71   Ht 5\' 8"  (1.727 m)   Wt 147 lb 12.8 oz (67 kg)   SpO2 96%   BMI 22.47 kg/m  Well developed and well nourished in no acute distress HENT normal Neck supple with JVP-flat Clear Device pocket well healed; without hematoma or erythema.  There is no tethering  Regular rate and rhythm, no  gallop No  murmur Abd-soft with active BS No Clubbing cyanosis  edema Skin-warm and dry A & Oriented  Grossly normal sensory and motor function  ECG sinus at 71 Intervals 19/11/37 Inferior wall MI Possible ASMI  CrCl cannot be calculated (Patient's most recent lab result is older than the maximum 21 days allowed.).   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  Suggested that he take an extra carvedilol before he does that. Euvolemic.  For now we will have him continue his torsemide at 20 spironolactone at 25 at bedtime.  Continues on digoxin last level was 0.5 On Coumadin as well as adjunct of aspirin--we will defer ongoing aspirin therapy to Dr. DM as he has I think made the decision to continue with anticoagulation.  There supported the use of a NOAC in the case of apical thrombus and this might be a better long-term choice   Device function is normal  Current medicines are reviewed at length with the patient today .  The patient does not  have concerns regarding medicines.

## 2022-07-06 ENCOUNTER — Encounter (HOSPITAL_COMMUNITY): Payer: 59 | Admitting: Cardiology

## 2022-07-07 ENCOUNTER — Ambulatory Visit (INDEPENDENT_AMBULATORY_CARE_PROVIDER_SITE_OTHER): Payer: 59

## 2022-07-07 DIAGNOSIS — I255 Ischemic cardiomyopathy: Secondary | ICD-10-CM

## 2022-07-08 ENCOUNTER — Other Ambulatory Visit: Payer: Self-pay | Admitting: Cardiology

## 2022-07-08 DIAGNOSIS — I4891 Unspecified atrial fibrillation: Secondary | ICD-10-CM

## 2022-07-11 ENCOUNTER — Ambulatory Visit (HOSPITAL_COMMUNITY)
Admission: RE | Admit: 2022-07-11 | Discharge: 2022-07-11 | Disposition: A | Discharge status: Home / Self Care | Payer: 59 | Source: Ambulatory Visit | Attending: Cardiology | Admitting: Cardiology

## 2022-07-11 VITALS — BP 100/70 | HR 84 | Wt 154.4 lb

## 2022-07-11 DIAGNOSIS — Z79899 Other long term (current) drug therapy: Secondary | ICD-10-CM | POA: Insufficient documentation

## 2022-07-11 DIAGNOSIS — Z7901 Long term (current) use of anticoagulants: Secondary | ICD-10-CM | POA: Insufficient documentation

## 2022-07-11 DIAGNOSIS — I4891 Unspecified atrial fibrillation: Secondary | ICD-10-CM | POA: Insufficient documentation

## 2022-07-11 DIAGNOSIS — I480 Paroxysmal atrial fibrillation: Secondary | ICD-10-CM | POA: Diagnosis not present

## 2022-07-11 DIAGNOSIS — I5022 Chronic systolic (congestive) heart failure: Secondary | ICD-10-CM | POA: Diagnosis not present

## 2022-07-11 DIAGNOSIS — E781 Pure hyperglyceridemia: Secondary | ICD-10-CM | POA: Diagnosis not present

## 2022-07-11 DIAGNOSIS — Z95810 Presence of automatic (implantable) cardiac defibrillator: Secondary | ICD-10-CM | POA: Diagnosis not present

## 2022-07-11 DIAGNOSIS — Z951 Presence of aortocoronary bypass graft: Secondary | ICD-10-CM | POA: Diagnosis not present

## 2022-07-11 DIAGNOSIS — I2510 Atherosclerotic heart disease of native coronary artery without angina pectoris: Secondary | ICD-10-CM | POA: Insufficient documentation

## 2022-07-11 DIAGNOSIS — I252 Old myocardial infarction: Secondary | ICD-10-CM | POA: Diagnosis not present

## 2022-07-11 DIAGNOSIS — N183 Chronic kidney disease, stage 3 unspecified: Secondary | ICD-10-CM | POA: Insufficient documentation

## 2022-07-11 DIAGNOSIS — Z7984 Long term (current) use of oral hypoglycemic drugs: Secondary | ICD-10-CM | POA: Diagnosis not present

## 2022-07-11 LAB — BASIC METABOLIC PANEL
Anion gap: 8 (ref 5–15)
BUN: 17 mg/dL (ref 6–20)
CO2: 28 mmol/L (ref 22–32)
Calcium: 9.8 mg/dL (ref 8.9–10.3)
Chloride: 104 mmol/L (ref 98–111)
Creatinine, Ser: 1.25 mg/dL — ABNORMAL HIGH (ref 0.61–1.24)
GFR, Estimated: >60 mL/min (ref >60)
Glucose, Bld: 101 mg/dL — ABNORMAL HIGH (ref 70–99)
Potassium: 4.8 mmol/L (ref 3.5–5.1)
Sodium: 140 mmol/L (ref 135–145)

## 2022-07-11 LAB — BRAIN NATRIURETIC PEPTIDE: B Natriuretic Peptide: 267.5 pg/mL — ABNORMAL HIGH (ref 0.0–100.0)

## 2022-07-11 LAB — DIGOXIN LEVEL: Digoxin Level: 0.4 ng/mL — ABNORMAL LOW (ref 0.8–2.0)

## 2022-07-11 MED ORDER — ENTRESTO 49-51 MG PO TABS: 1.0000 | ORAL_TABLET | Freq: Two times a day (BID) | ORAL | 11 refills | Status: DC

## 2022-07-11 NOTE — Patient Instructions (Signed)
STOP Asprin  INCREASE Entresto to 49/51 Twice daily  Continue Torsemide 20 mg for 2 more days, then decrease to 10mg  (1/2 Tab) daily   Labs done today, your results will be available in MyChart, we will contact you for abnormal readings.  Repeat blood work in 10 days.  Your physician recommends that you schedule a follow-up appointment in: 3 months ( January 2024)  ** please call the office in [facility] November to arrange your follow up appointment **  If you have any questions or concerns before your next appointment please send [place] a message through [place] or call our office at [phone].    TO LEAVE A MESSAGE FOR THE NURSE SELECT OPTION 2, PLEASE LEAVE A MESSAGE INCLUDING: YOUR NAME DATE OF BIRTH CALL BACK NUMBER REASON FOR CALL**this is important as we prioritize the call backs  YOU WILL RECEIVE A CALL BACK THE SAME DAY AS LONG AS YOU CALL BEFORE 4:00 PM  At the [facility], you and your health needs are our priority. As part of our continuing mission to provide you with exceptional heart care, we have created designated Provider Care Teams. These Care Teams include your primary Cardiologist (physician) and Advanced Practice Providers (APPs- Physician Assistants and Nurse Practitioners) who all work together to provide you with the care you need, when you need it.   You may see any of the following providers on your designated Care Team at your next follow up: Dr Glori Bickers Dr Loralie Champagne Dr. Roxana Hires, NP Lyda Jester, [place] [facility] [place], [place] Forestine Na, NP Audry Riles, PharmD   Please be sure to bring in all your medications bottles to every appointment.

## 2022-07-12 LAB — CUP PACEART REMOTE DEVICE CHECK
Battery Remaining Longevity: 168 mo
Battery Remaining Percentage: 100 %
Brady Statistic RV Percent Paced: 0 %
Date Time Interrogation Session: 20231017203100
HighPow Impedance: 64 Ohm
Implantable Lead Implant Date: 20220413
Implantable Lead Location: 753860
Implantable Lead Model: 183
Implantable Lead Serial Number: 303520
Implantable Pulse Generator Implant Date: 20220413
Lead Channel Impedance Value: 538 Ohm
Lead Channel Pacing Threshold Amplitude: 0.7 V
Lead Channel Pacing Threshold Pulse Width: 0.4 ms
Lead Channel Setting Pacing Amplitude: 2 V
Lead Channel Setting Pacing Pulse Width: 0.4 ms
Lead Channel Setting Sensing Sensitivity: 0.5 mV
Pulse Gen Serial Number: 212369

## 2022-07-12 NOTE — Progress Notes (Signed)
Advanced Heart Failure Clinic Note  PCP: Kathyrn Lass, MD HF Cardiology: Dr. Aundra Dubin  CT Surgery: Dr. Roxan Hockey   51 y.o. male s/p back surgery 05/2020 was admitted for acute inferior and anterolateral MI on 06/25/20. LHC showed thrombotic occlusion of distal RCA that was treated with PTCA and thrombectomy.  He was also noted to have chronic total occlusion of the LAD with collaterals from the right (thus this territory was affected by the RCA MI) as well as 90% complex proximal LCx stenosis. EF 25-30% + apical thrombus and severe mitral regurgitation. RV normal. Also w/ post MI pericarditis, treated w/ colchicine. Course further c/b cardiogenic shock and acute hypoxic respiratory failure w/ component of septic shock 2/2 PNA, requiring intubation and treatment w/ abx. Once stabilized, he underwent CABG x 3 (LIMA-LAD, Lt radial-OM1 and sequential SVG-PDA/PLA) + MV repair and placement of Impella 5.5 on 10/13 by Dr. Roxan Hockey.   Post operatively, he developed tamponade and was taken back to the OR on 10/14 with large hematoma obstructing right atrium. He improved and was extubated 10/19.  Impella removal 10/21 w/ stable hemodynamics and able to tolerate initiation of GDMT.  Also had post-operative afib treated w/ amiodarone. He was discharged home on 07/21/20.   Echo in 3/22 showed EF 20-25% with diffuse hypokinesis, mildly decreased RV systolic function, no LV thrombus, normal IVC, s/p MV repair with trivial MR and mild stenosis (mean gradient 4 mmHg).  S[place]cliffe placed in 4/22.  Later in 4/22, he had L4-L5 spinal fusion.   CPX in 10/22 showed mild-moderate HF limitation. Echo 4/23 EF 25-30%, no LV thrombus, normal RV.   Today he returns for HF follow up with his girlfriend. Entresto decreased after last appt with soft BP and elevated creatinine.  Weight is stable.  He feels like he has had more of a tendency to retain fluid.  However, symptomatically seems to be doing well.  No  significant exertional dyspnea.  He works full time as a Dealer.  No orthopnea/PND.  No chest pain.  No claudication.    [employer] device interrogation: Heartlogic score 15, impedance decreased  ECG (personally reviewed): NSR, old inferior and anterior MIs  Labs (11/21): LDL 59, HDL 34 Labs (1/22): digoxin level 1.2 Labs (2/22): K 4.3, creatinine 1.1 Labs (4/22): K 5, creatinine 1.02, hgb 11.8 Labs (5/22): K 4.1, creatinine 0.88, hgb 15.5, digoxin 0.4 Labs (9/22): LDL 34, TGs 315 Labs (11/22): K 3.6, creatinine 1.22 => 1.77 Labs (3/23): TGs 350, LDL 8 Labs (4/23): K 4.6, creatinine 1.25, LDL 44, HDL 23 Labs (9/23): K 4, creatinine 1.82 => 1.62 Labs (10/23): creatinine 1.25  Review of systems complete and found to be negative unless listed in HPI.    PMH: 1. Anxiety 2. Hyperlipidemia 3. CAD: Inferior and anterolateral MI 10/21.  LHC showed thrombotic occlusion distal RCA treated with PTCA/thrombectomy, CTO LAD with R=>L collaterals, 90% complex proximal LCx stenosis.  - CABG x 4 in 10/21 with LIMA-LAD, left radial-OM1, sequential SVG-PDA/PLV.  4. Mitral regurgitation: Suspect infarct-related MR.   - Mitral valve repair in 10/21 with CABG.  5. LV thrombus  6. Chronic systolic CHF: ischemic cardiomyopathy.  [place].  Echo (10/21) with EF 25-30%, apical thrombus, severe MR, normal RV.   - Echo (3/22): EF 20-25% with diffuse hypokinesis, mildly decreased RV systolic function, no LV thrombus, normal IVC, s/p MV repair with trivial MR and mild stenosis (mean gradient 4 mmHg).   - CPX (10/22): RER 1.17, VE/VCO2  slope 31, peak VO2 22.7 => mild-moderate HF limitation.  - Echo (4/23): EF 25-30%, no LV thrombus, normal RV. 7. Post-infarct pericarditis in 10/21. 8. Atrial fibrillation: Post-op in 10/21.  9. H/o back surgery 9/21: Discectomy.   10. CKD stage 3  Current Outpatient Medications  Medication Sig Dispense Refill   acetaminophen (TYLENOL) 325 MG tablet Take  2 tablets (650 mg total) by mouth every 6 (six) hours as needed for mild pain.     ALPRAZolam (XANAX) 1 MG tablet Take 1 mg by mouth at bedtime as needed for anxiety.     atorvastatin (LIPITOR) 80 MG tablet Take 1 tablet (80 mg total) by mouth daily. 90 tablet 1   carvedilol (COREG) 6.25 MG tablet TAKE 1 TABLET(6.25 MG) BY MOUTH TWICE DAILY WITH A MEAL 60 tablet 11   digoxin (LANOXIN) 0.125 MG tablet Take 0.5 tablets (0.0625 mg total) by mouth daily. 15 tablet 11   icosapent Ethyl (VASCEPA) 1 g capsule Take 2 capsules (2 g total) by mouth 2 (two) times daily. 120 capsule 11   JARDIANCE 10 MG TABS tablet TAKE 1 TABLET(10 MG) BY MOUTH DAILY BEFORE BREAKFAST 30 tablet 11   linaclotide (LINZESS) 145 MCG CAPS capsule Take 145 mcg by mouth daily before breakfast.     Multiple Vitamin (MULTIVITAMIN WITH MINERALS) TABS tablet Take 1 tablet by mouth in the morning.     sacubitril-valsartan (ENTRESTO) 49-51 MG Take 1 tablet by mouth 2 (two) times daily. 60 tablet 11   spironolactone (ALDACTONE) 25 MG tablet TAKE 1 TABLET(25 MG) BY MOUTH AT BEDTIME 90 tablet 3   torsemide (DEMADEX) 20 MG tablet Take 20 mg by mouth daily. Takes additional tablets if weight gain     traZODone (DESYREL) 100 MG tablet Take 100 mg by mouth at bedtime.     warfarin (COUMADIN) 2.5 MG tablet TAKE 1 TO 1 AND 1/2 TABLETS BY MOUTH DAILY AS DIRECTED BY COAGULATION CLINIC 40 tablet 3   No current facility-administered medications for this encounter.   Allergies  Allergen Reactions   Fenofibrate Other (See Comments)    GI upset, Joint pain    Social History   Socioeconomic History   Marital status: Single    Spouse name: Not on file   Number of children: Not on file   Years of education: Not on file   Highest education level: Not on file  Occupational History    Employer: [employer]  Tobacco Use   Smoking status: Never   Smokeless tobacco: Never  Vaping Use   Vaping Use: Never used  Substance and Sexual Activity    Alcohol use: Yes    Alcohol/week: 1.0 standard drink of alcohol    Types: 1 Standard drinks or equivalent per week   Drug use: No   Sexual activity: Yes    Birth control/protection: None  Other Topics Concern   Not on file  Social History Narrative   Not on file   Social Determinants of Health   Financial Resource Strain: Not on file  Food Insecurity: Not on file  Transportation Needs: Not on file  Physical Activity: Not on file  Stress: Not on file  Social Connections: Not on file  Intimate Partner Violence: Not on file   FH: No family history of premature CAD.   BP 100/70   Pulse 84   Wt 70 kg (154 lb 6.4 oz)   SpO2 96%   BMI 23.48 kg/m   Wt Readings from Last 3 Encounters:  07/11/22 70 kg (154 lb 6.4 oz)  07/03/22 67 kg (147 lb 12.8 oz)  04/18/22 69.6 kg (153 lb 6.4 oz)   PHYSICAL EXAM: General: NAD Neck: No JVD, no thyromegaly or thyroid nodule.  Lungs: Clear to auscultation bilaterally with normal respiratory effort. CV: Nondisplaced PMI.  Heart regular S1/S2, no S3/S4, no murmur.  No peripheral edema.  No carotid bruit.  Normal pedal pulses.  Abdomen: Soft, nontender, no hepatosplenomegaly, no distention.  Skin: Intact without lesions or rashes.  Neurologic: Alert and oriented x 3.  Psych: Normal affect. Extremities: No clubbing or cyanosis.  HEENT: Normal.   ASSESSMENT & PLAN: 1. CAD: Late presentation inferior and anterolateral MI 10/21.  Cath showed thrombotic occlusion distal RCA, CTO LAD with collaterals from right, and complex 90% proximal LCx stenosis.  He had PTCA/thrombectomy RCA with good flow at end of procedure.  Suspect he had damage to LAD territory as well as RCA territory as RCA collateralized the LAD.  S/p CABG w/ impella support with LIMA-LAD, left radial to OM, seq SVG-PDA/PLV. Post op course c/b tamponade and was taken back to the OR on 10/14 for drainage, found to have large hematoma obstructing right atrium.  No chest pain.  - With  warfarin use, he can stop ASA.  - Continue atorvastatin 80 mg daily. Good lipids 4/23. 2. 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 Echo in 10/21 with EF 25-30%.  Echo in 3/22 showed EF 20-25%, diffuse hypokinesis, mildly decreased RV systolic function.  He now has a Environmental manager ICD.  CPX in 10/22 with mild-moderate HF limitation.  Echo 4/23 showed EF 25-30%, no LV thrombus, normal RV.  NYHA class I-II symptoms, not volume overloaded on exam.  However, Heartlogic 15 suggests mild volume overload. BP is stable, no lightheadedness.  Creatinine 1.25 today (improved).  - I am going to increase Entresto back to 49/51 bid.  BMET/BNP today, BMET in 10 days.   - With mild volume overload by Heartlogic, continue torsemide 20 mg daily in along with increased Entresto x 2 days then decrease torsemide to 10 mg daily.  - Continue spironolactone 25 mg qhs. - Continue Coreg 6.25 mg bid. - Continue digoxin 0.0625 mg daily. Check level today.  - Continue Jardiance 10 mg daily.  - Narrow QRS so not CRT candidate.    - He would likely be a baroreceptor activation therapy or cardiac contractility monitor candidate, not interested at this time though.  3. Severe MR: Suspect infarct-related MR. s/p MV repair at time of CABG 10/21.  Mild MR on 4/23 echo.  4. LV thrombus: Not present on 4/23 echo.  - Continue warfarin.  5. Post-operative atrial fibrillation: Regular on exam today. He is on warfarin, off amiodarone.  6. Post-MI pericarditis: Now off colchicine.  7. Hypertriglyceridemia: Unable to tolerate fenofibrate due to GI upset.  Continue Vascepa, good lipids 4/23.   Follow up in 3 months   Marca Ancona, MD 07/12/22

## 2022-07-12 NOTE — Progress Notes (Signed)
Remote ICD transmission.   

## 2022-07-21 ENCOUNTER — Ambulatory Visit (HOSPITAL_COMMUNITY)
Admission: RE | Admit: 2022-07-21 | Discharge: 2022-07-21 | Disposition: A | Discharge status: Home / Self Care | Payer: 59 | Source: Ambulatory Visit | Attending: Cardiology | Admitting: Cardiology

## 2022-07-21 DIAGNOSIS — I5022 Chronic systolic (congestive) heart failure: Secondary | ICD-10-CM | POA: Insufficient documentation

## 2022-07-21 LAB — BASIC METABOLIC PANEL
Anion gap: 6 (ref 5–15)
BUN: 25 mg/dL — ABNORMAL HIGH (ref 6–20)
CO2: 29 mmol/L (ref 22–32)
Calcium: 9 mg/dL (ref 8.9–10.3)
Chloride: 101 mmol/L (ref 98–111)
Creatinine, Ser: 1.56 mg/dL — ABNORMAL HIGH (ref 0.61–1.24)
GFR, Estimated: 53 mL/min — ABNORMAL LOW (ref >60)
Glucose, Bld: 86 mg/dL (ref 70–99)
Potassium: 3.9 mmol/L (ref 3.5–5.1)
Sodium: 136 mmol/L (ref 135–145)

## 2022-07-27 ENCOUNTER — Ambulatory Visit: Payer: 59 | Attending: Cardiology

## 2022-07-27 DIAGNOSIS — Z5181 Encounter for therapeutic drug level monitoring: Secondary | ICD-10-CM | POA: Diagnosis not present

## 2022-07-27 DIAGNOSIS — I4891 Unspecified atrial fibrillation: Secondary | ICD-10-CM

## 2022-07-27 DIAGNOSIS — I824Y2 Acute embolism and thrombosis of unspecified deep veins of left proximal lower extremity: Secondary | ICD-10-CM | POA: Diagnosis not present

## 2022-07-27 LAB — POCT INR: INR: 2.8 (ref 2.0–3.0)

## 2022-07-27 NOTE — Patient Instructions (Signed)
Continue taking warfarin 1 tablet daily except 1.5 tablets on Sundays and Wednesdays.  Recheck INR in 6 weeks. Coumadin Clinic [phone] or [phone]

## 2022-08-03 ENCOUNTER — Other Ambulatory Visit (HOSPITAL_COMMUNITY): Payer: Self-pay | Admitting: Cardiology

## 2022-08-12 IMAGING — DX DG CHEST 1V PORT
1 series · 1 of 1 positions shown · non-contrast
Comparison: June 25, 2020

CLINICAL DATA: STEMI

EXAM:
PORTABLE CHEST 1 VIEW

[chest ap]
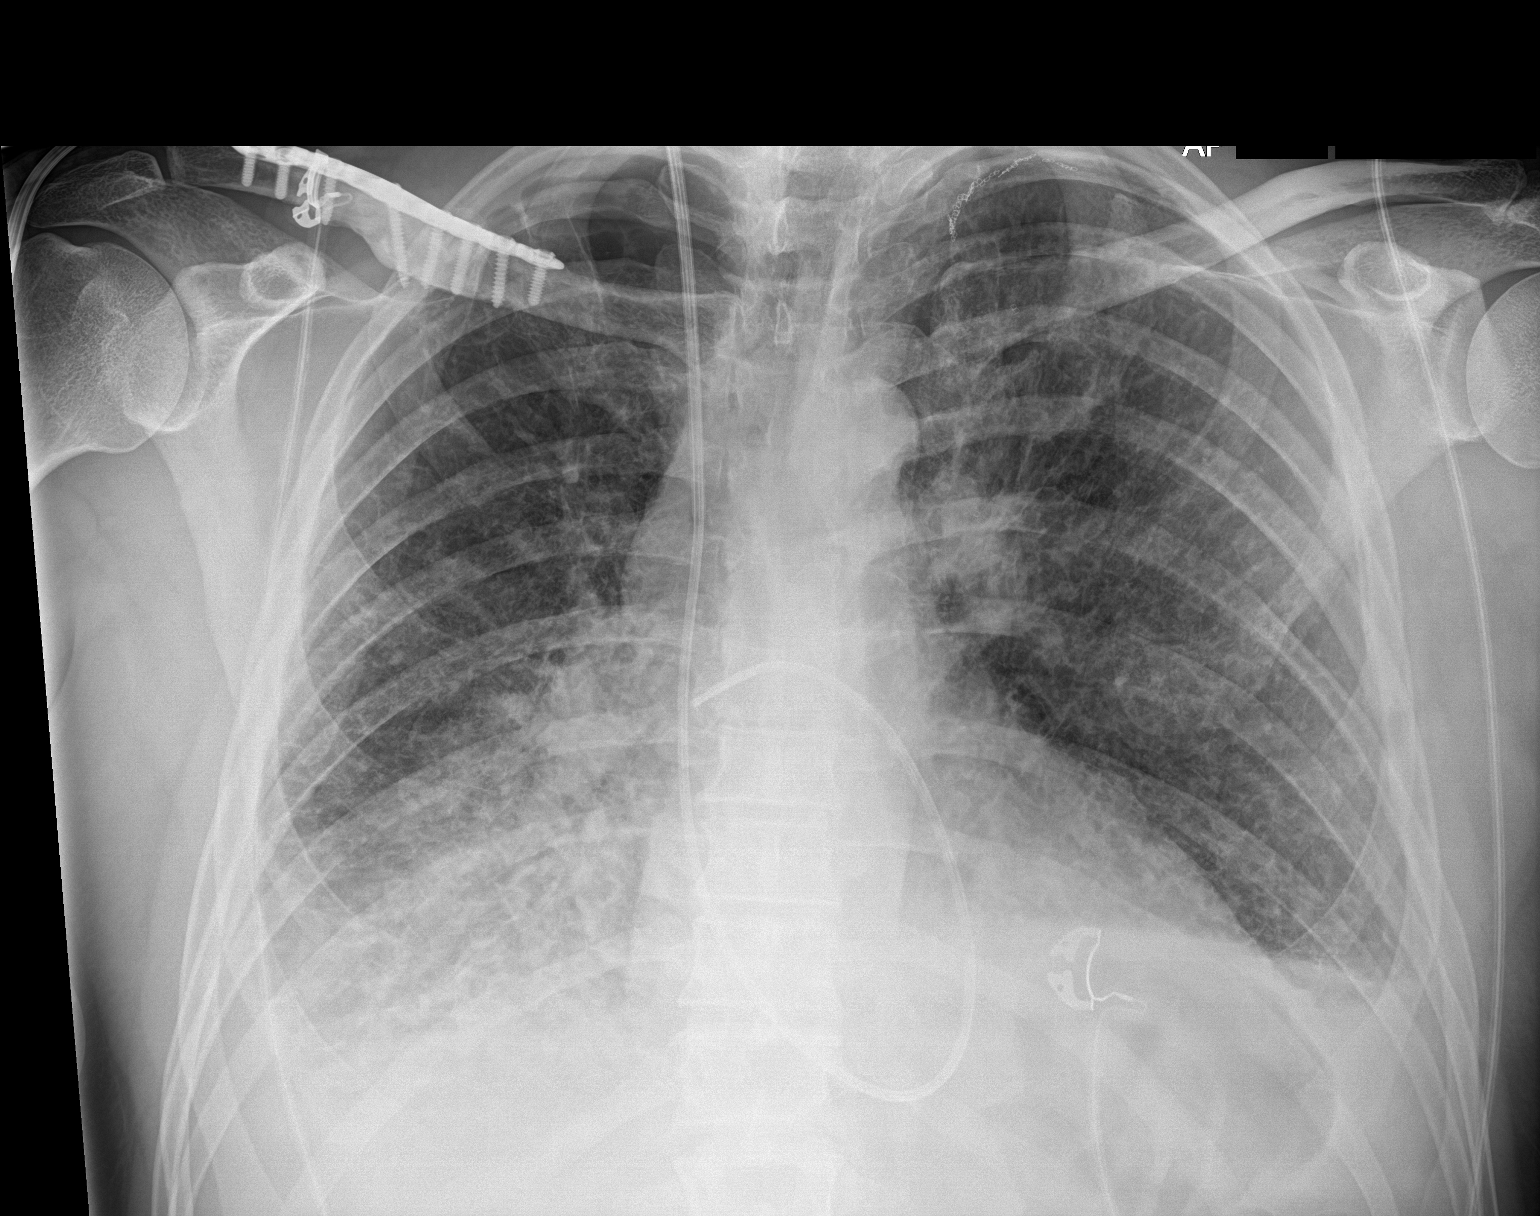

[1 of 1 positions shown; findings below may reference images not displayed]

FINDINGS: The cardiomediastinal silhouette is unchanged and enlarged in
contour.RIGHT IJ PA catheter tip terminates over the RIGHT lower
lobar pulmonary artery. Small bilateral pleural effusions. No
pneumothorax. Bibasilar predominant interstitial prominence,
reticulation and perihilar vascular fullness. Sutures project over
the upper LEFT lung. Visualized abdomen is unremarkable. Status post
ORIF of the RIGHT clavicle.
IMPRESSION: 1.  Support apparatus as described above.
2. Constellation of findings are consistent with pulmonary edema and
small bilateral pleural effusions, similar in comparison to prior.

## 2022-08-12 IMAGING — DX DG CHEST 1V PORT
1 series · 2 of 2 positions shown · non-contrast
Comparison: 06/27/2020

CLINICAL DATA: Endotracheal tube placement

EXAM:
PORTABLE CHEST 1 VIEW

[Series 1: chest · 0.14mm/px · 2 of 2 slices shown]
[im 1/2]
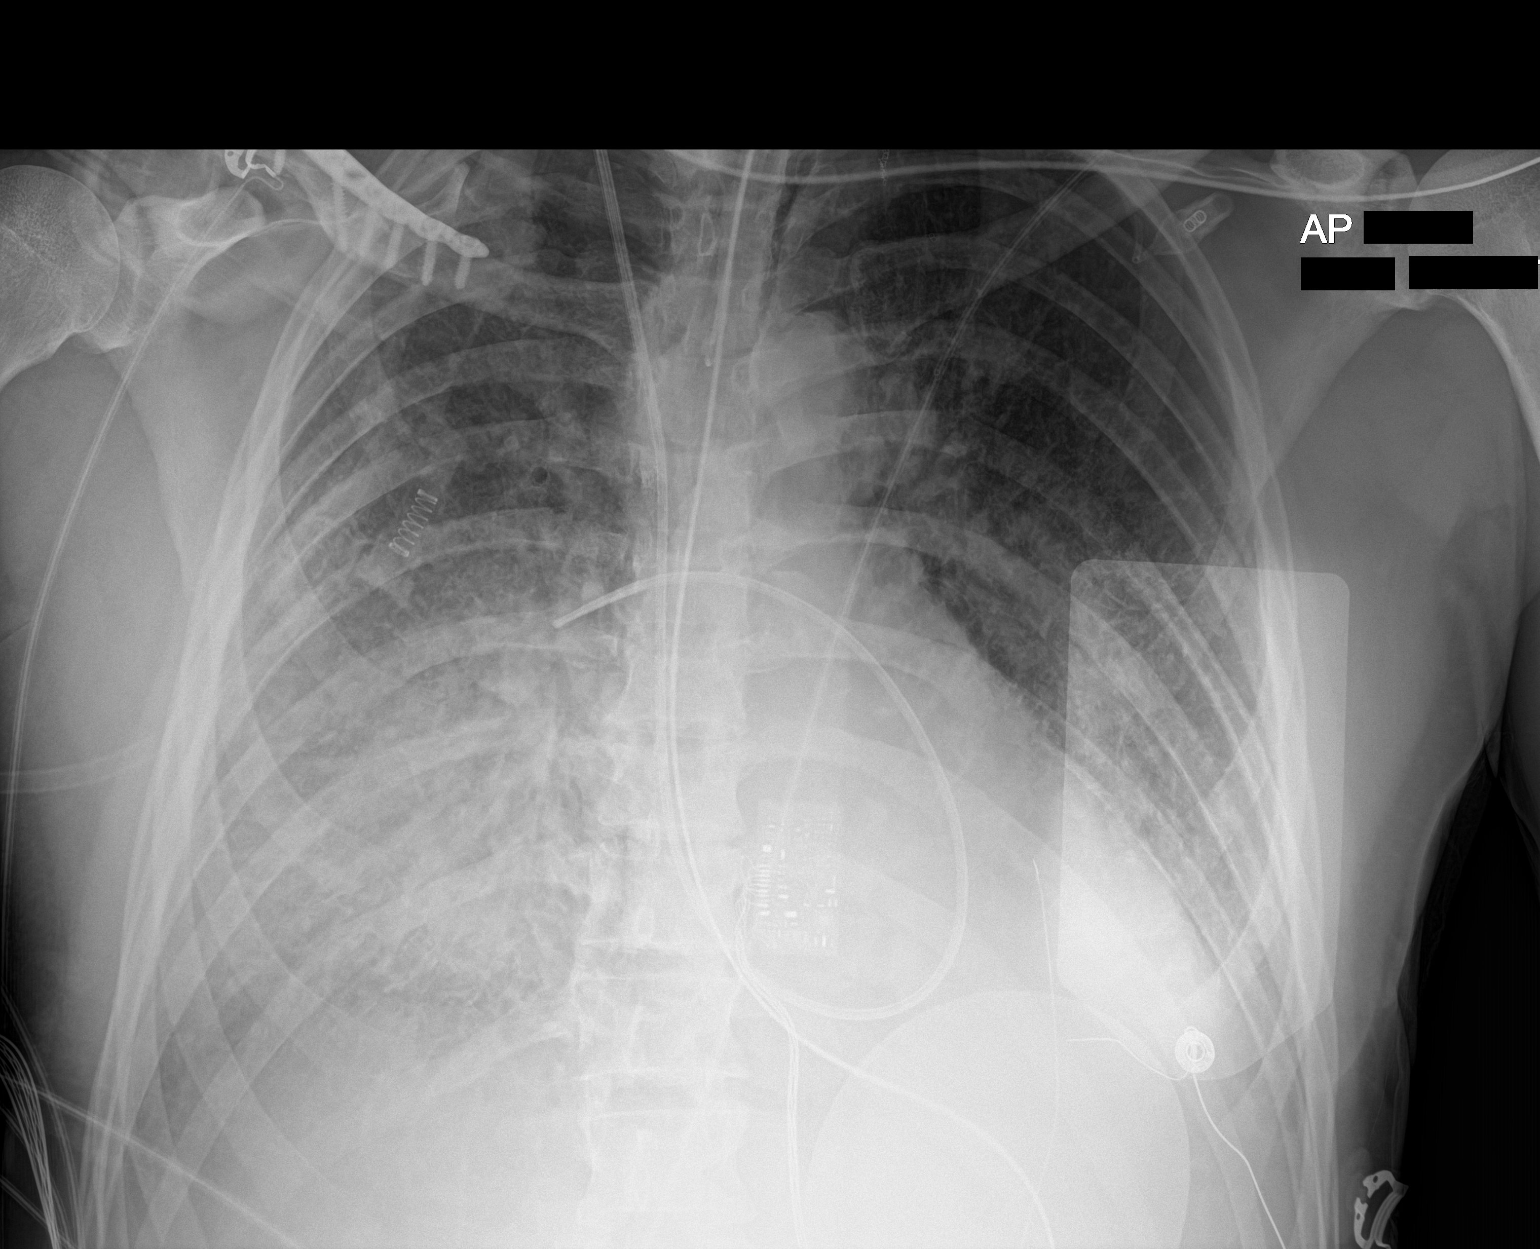
[im 2/2]
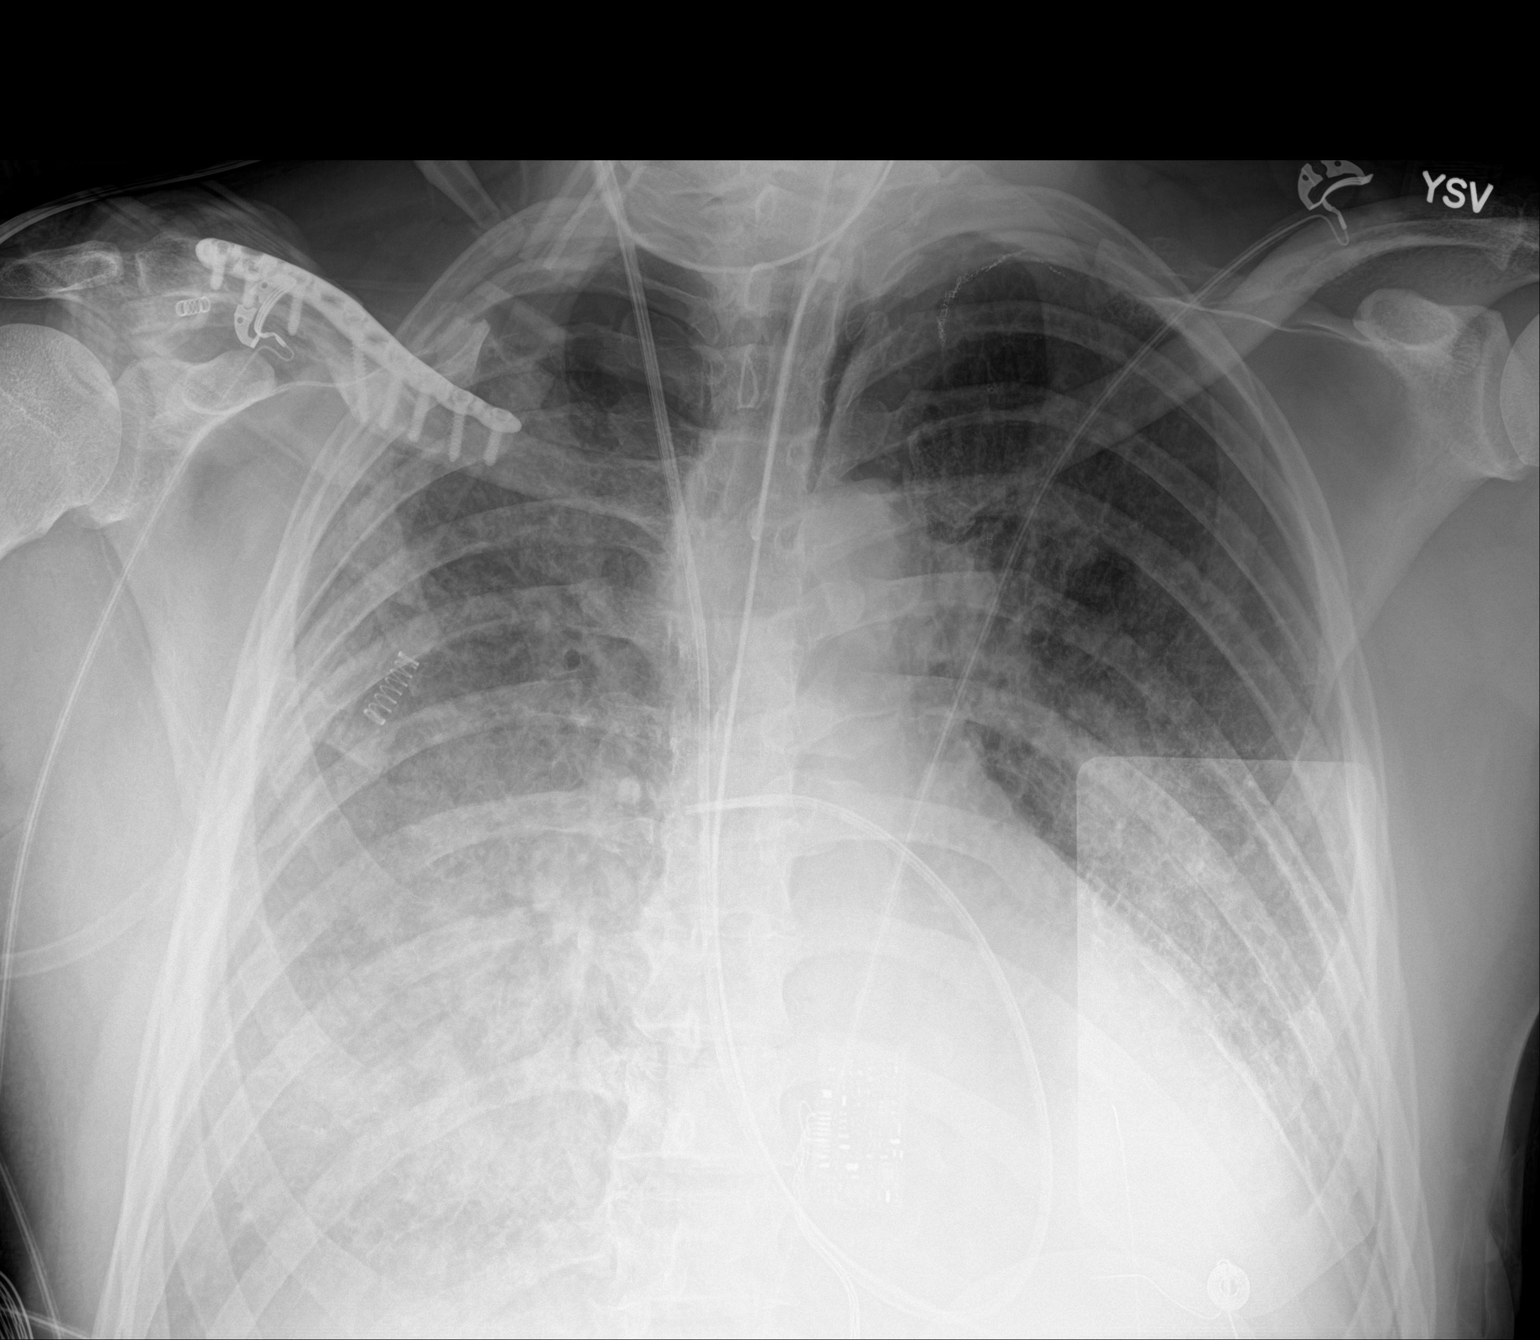

[2 of 2 positions shown; findings below may reference images not displayed]

FINDINGS: The endotracheal tube terminates above the carina by approximately
2.9 cm. The Swan-Ganz catheter projects over the main right
pulmonary artery. The heart size is unchanged. Again noted are
findings of pulmonary edema. There is no definite pneumothorax.
There are probable bilateral pleural effusions. The enteric tube
extends below the left hemidiaphragm. The tip is not visualized on
this study. There are postsurgical changes at the left lung apex.
IMPRESSION: 1. Lines and tubes as above.
2. Persistent pulmonary edema with bilateral pleural effusions.

## 2022-08-14 IMAGING — DX DG CHEST 1V PORT
1 series · 1 of 1 positions shown · non-contrast
Comparison: 06/27/2020.

CLINICAL DATA: Intubation.  CHF.

EXAM:
PORTABLE CHEST 1 VIEW

[chest]
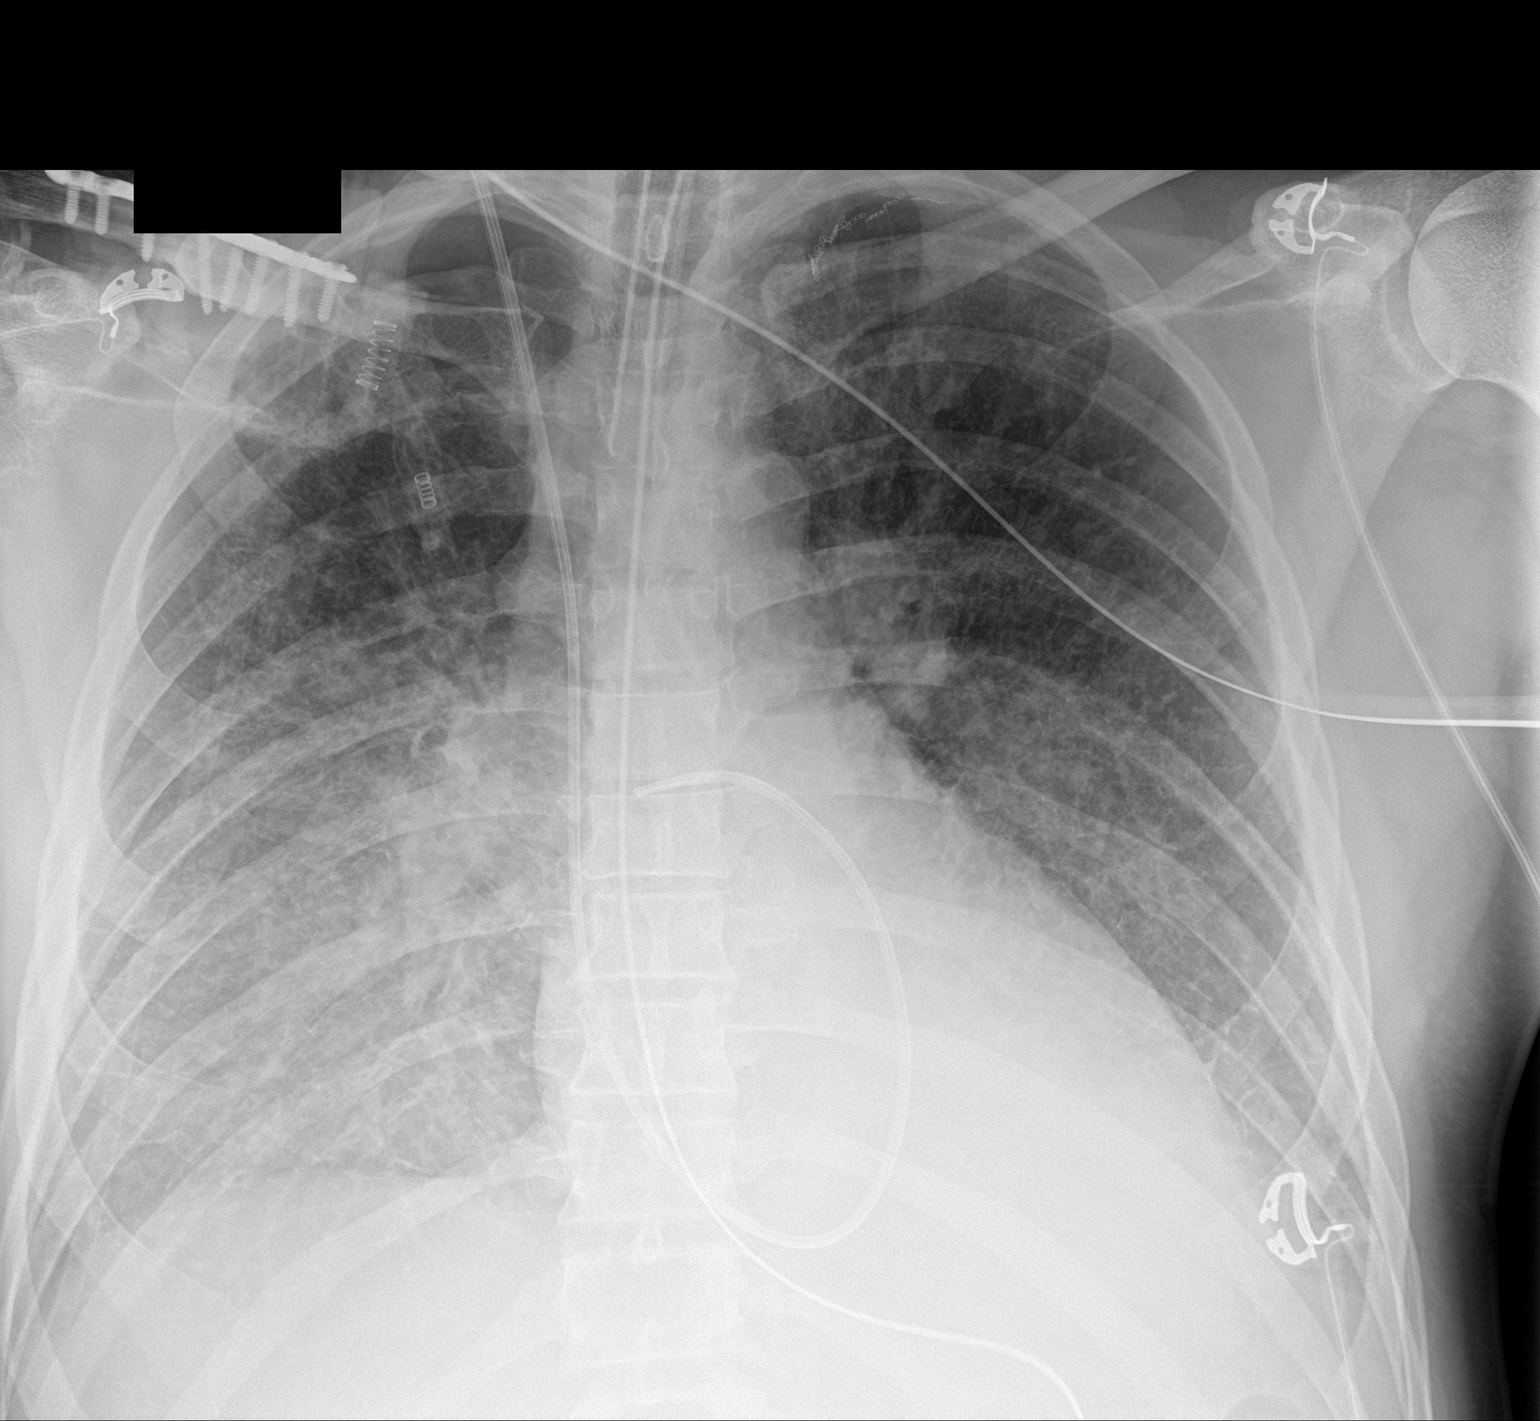

[1 of 1 positions shown; findings below may reference images not displayed]

FINDINGS: Endotracheal tube and NG tube in stable position. Swan-Ganz catheter
noted with its tip over the pulmonary outflow tract. Heart size
normal. Surgical clips left apex. Diffuse bilateral pulmonary
infiltrates/edema again noted without interim change. No pleural
effusion or pneumothorax. Prior right clavicular plate and screw
fixation.
IMPRESSION: 1. Endotracheal tube and NG tube in stable position. Swan-Ganz
catheter noted with its tip over the pulmonary outflow tract.
2. Diffuse bilateral pulmonary infiltrates/edema again noted without
interim change.

## 2022-09-04 ENCOUNTER — Telehealth: Payer: Self-pay | Admitting: Internal Medicine

## 2022-09-04 ENCOUNTER — Telehealth (HOSPITAL_COMMUNITY): Payer: Self-pay | Admitting: Cardiology

## 2022-09-04 NOTE — Telephone Encounter (Signed)
If he continues to have lightheaded spells, would have him decrease Entresto to 24/26 bid.

## 2022-09-04 NOTE — Telephone Encounter (Signed)
Patient called to request an appt with Dr Shirlee Latch 10/11/22 Reports he blacked out for 2-3 mins after getting dizzy 09/03/22. His friends were able to help him up and check  his HR was in the 40's  HR is in the low 50-60's today Reports being fine however would only like to be seen by Dr Wynn Banker should symptoms return should report to ER for further evaluation and should contact device clinic to send transmission. Reports he didn't feel his device click on   Will send to provider as fyi

## 2022-09-04 NOTE — Telephone Encounter (Signed)
The patient states yesterday he passed out for 2 minutes. He states his heart rate has been low as well. I told him the nurse will review his transmission and give him a call back.

## 2022-09-04 NOTE — Telephone Encounter (Signed)
Get him appt with me (can work in) and have him send in device transmission.

## 2022-09-04 NOTE — Telephone Encounter (Signed)
STAT if HR is under 50 or over 120 (normal HR is 60-100 beats per minute)  What is your heart rate?   HR 53 bpm  Do you have a log of your heart rate readings (document readings)?   No  Do you have any other symptoms?   Patient stated he got dizzy and blacked out for about 2 minutes last night.  Patient stated his doctor recommended he do a device check out of precaution.  Patient stated his HR reading are usually around 80-89 bpm.

## 2022-09-04 NOTE — Telephone Encounter (Signed)
Remote transmission received and reviewed. Shows normal device function. No alerts triggered.  Patient called and updated.   Patient reports he feels like his BP dropped which may have caused him to have syncopal event. Patient reports hx of feeling dizzy when he changes positions. Patient has apt with Dr. Shirlee Latch on 10/11/21. Patient advised to check BP daily (he currently does not check them). Advised to change positions slowly and stay hydrated. Patient voiced understanding. Advised if he has another syncopal event to seek ED for evaluation.   Will forward to Dr. Shirlee Latch for update.

## 2022-09-05 NOTE — Telephone Encounter (Signed)
Pt would like to keep entresto at current dose and keep follow up as scheduled 

## 2022-09-05 NOTE — Telephone Encounter (Signed)
Pt would like to keep entresto at current dose and keep follow up as scheduled

## 2022-09-07 ENCOUNTER — Ambulatory Visit: Payer: 59 | Attending: Cardiovascular Disease

## 2022-09-07 DIAGNOSIS — I824Y2 Acute embolism and thrombosis of unspecified deep veins of left proximal lower extremity: Secondary | ICD-10-CM | POA: Diagnosis not present

## 2022-09-07 DIAGNOSIS — I4891 Unspecified atrial fibrillation: Secondary | ICD-10-CM

## 2022-09-07 DIAGNOSIS — Z5181 Encounter for therapeutic drug level monitoring: Secondary | ICD-10-CM

## 2022-09-07 LAB — POCT INR: INR: 3.7 — AB (ref 2.0–3.0)

## 2022-09-07 NOTE — Patient Instructions (Signed)
Description   Hold today's dose and then continue taking warfarin 1 tablet daily except 1.5 tablets on Sundays and Wednesdays.  Recheck INR in 5 weeks.  C[facility]madin Clinic [phone] or [phone]

## 2022-09-20 ENCOUNTER — Other Ambulatory Visit (HOSPITAL_COMMUNITY): Payer: Self-pay | Admitting: Cardiology

## 2022-10-02 IMAGING — DX DG CHEST 2V
2 series · 2 of 2 positions shown · non-contrast
Comparison: 07/21/2020

CLINICAL DATA: Status post coronary bypass grafting

EXAM:
CHEST - 2 VIEW

[dg chest 2 view (1 of 2)]
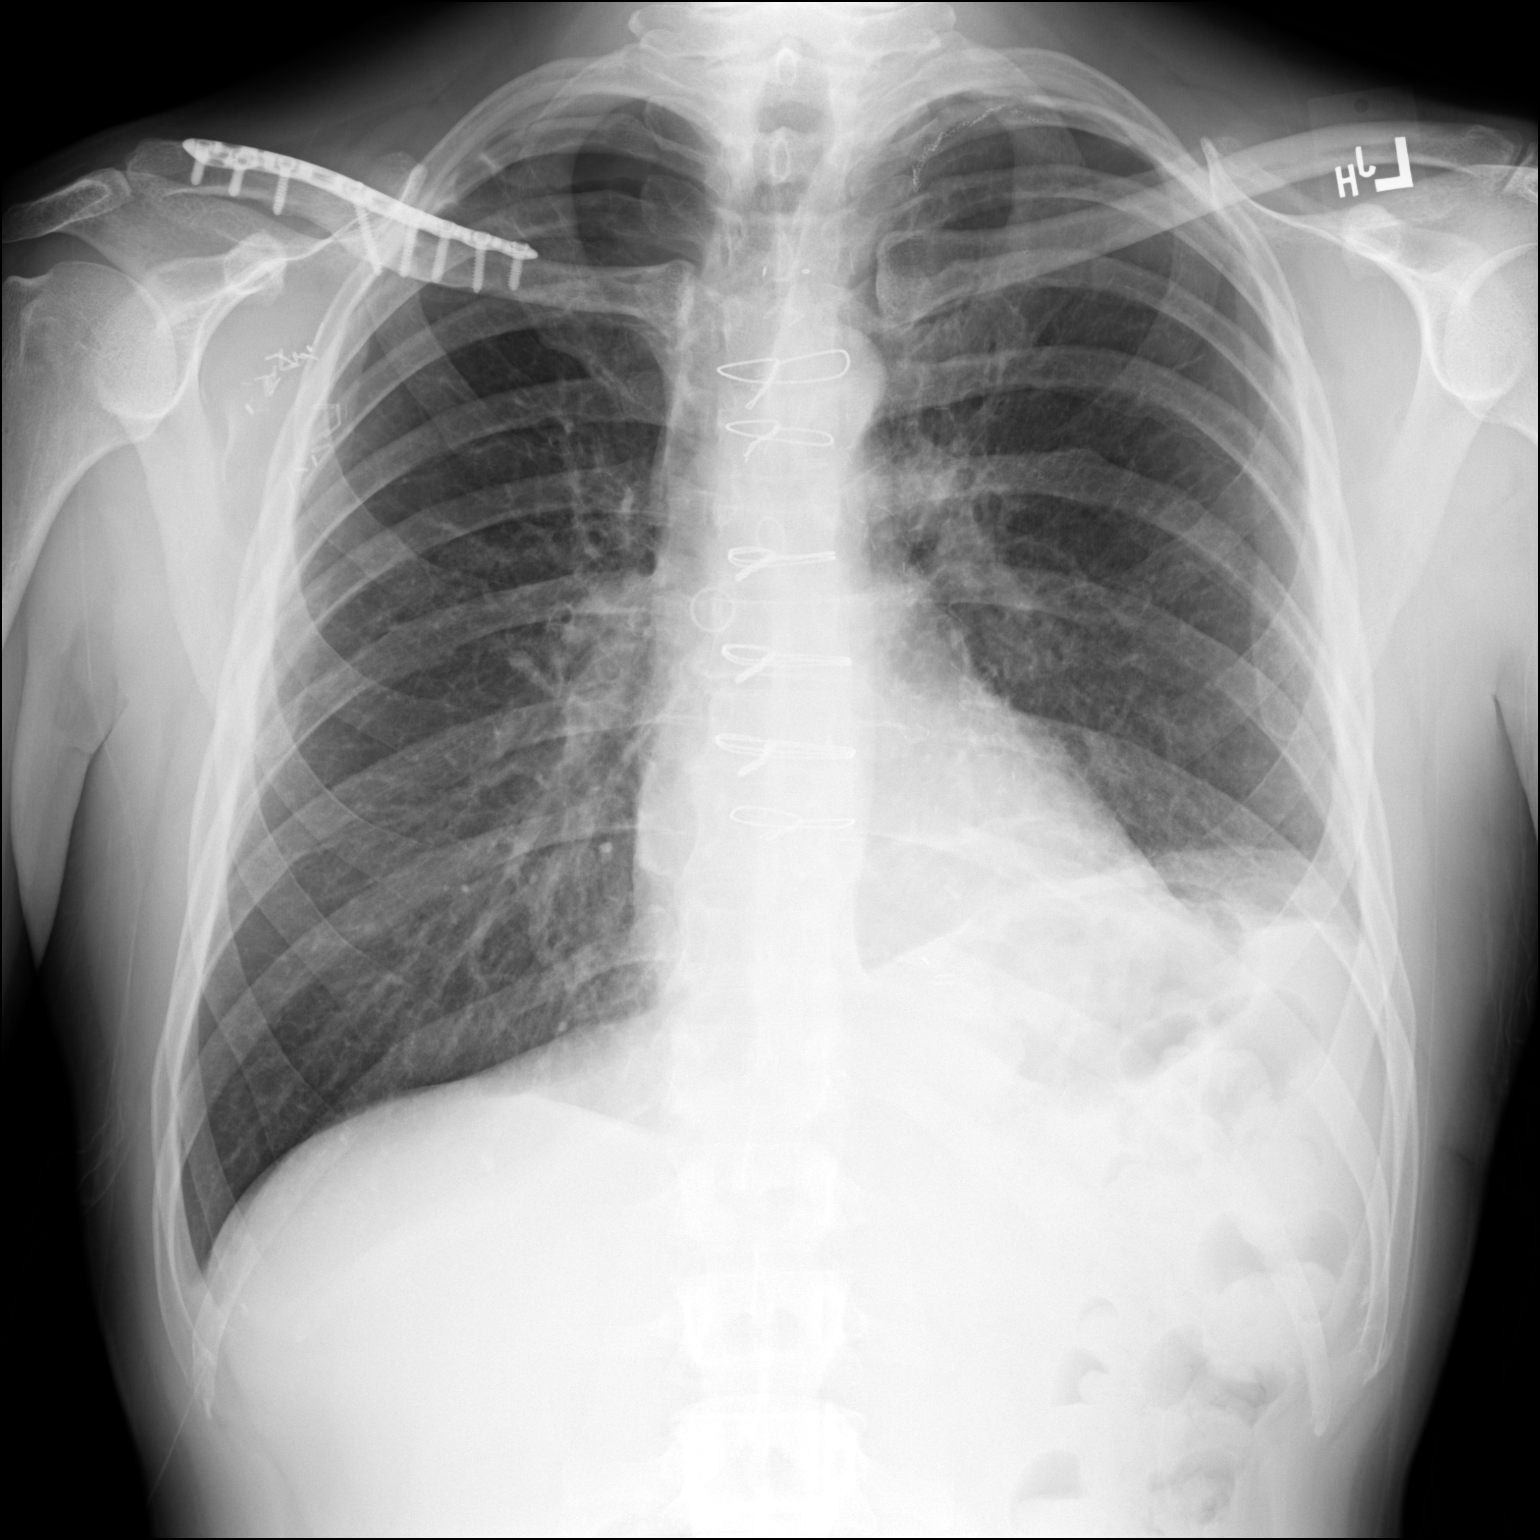

[dg chest 2 view (2 of 2)]
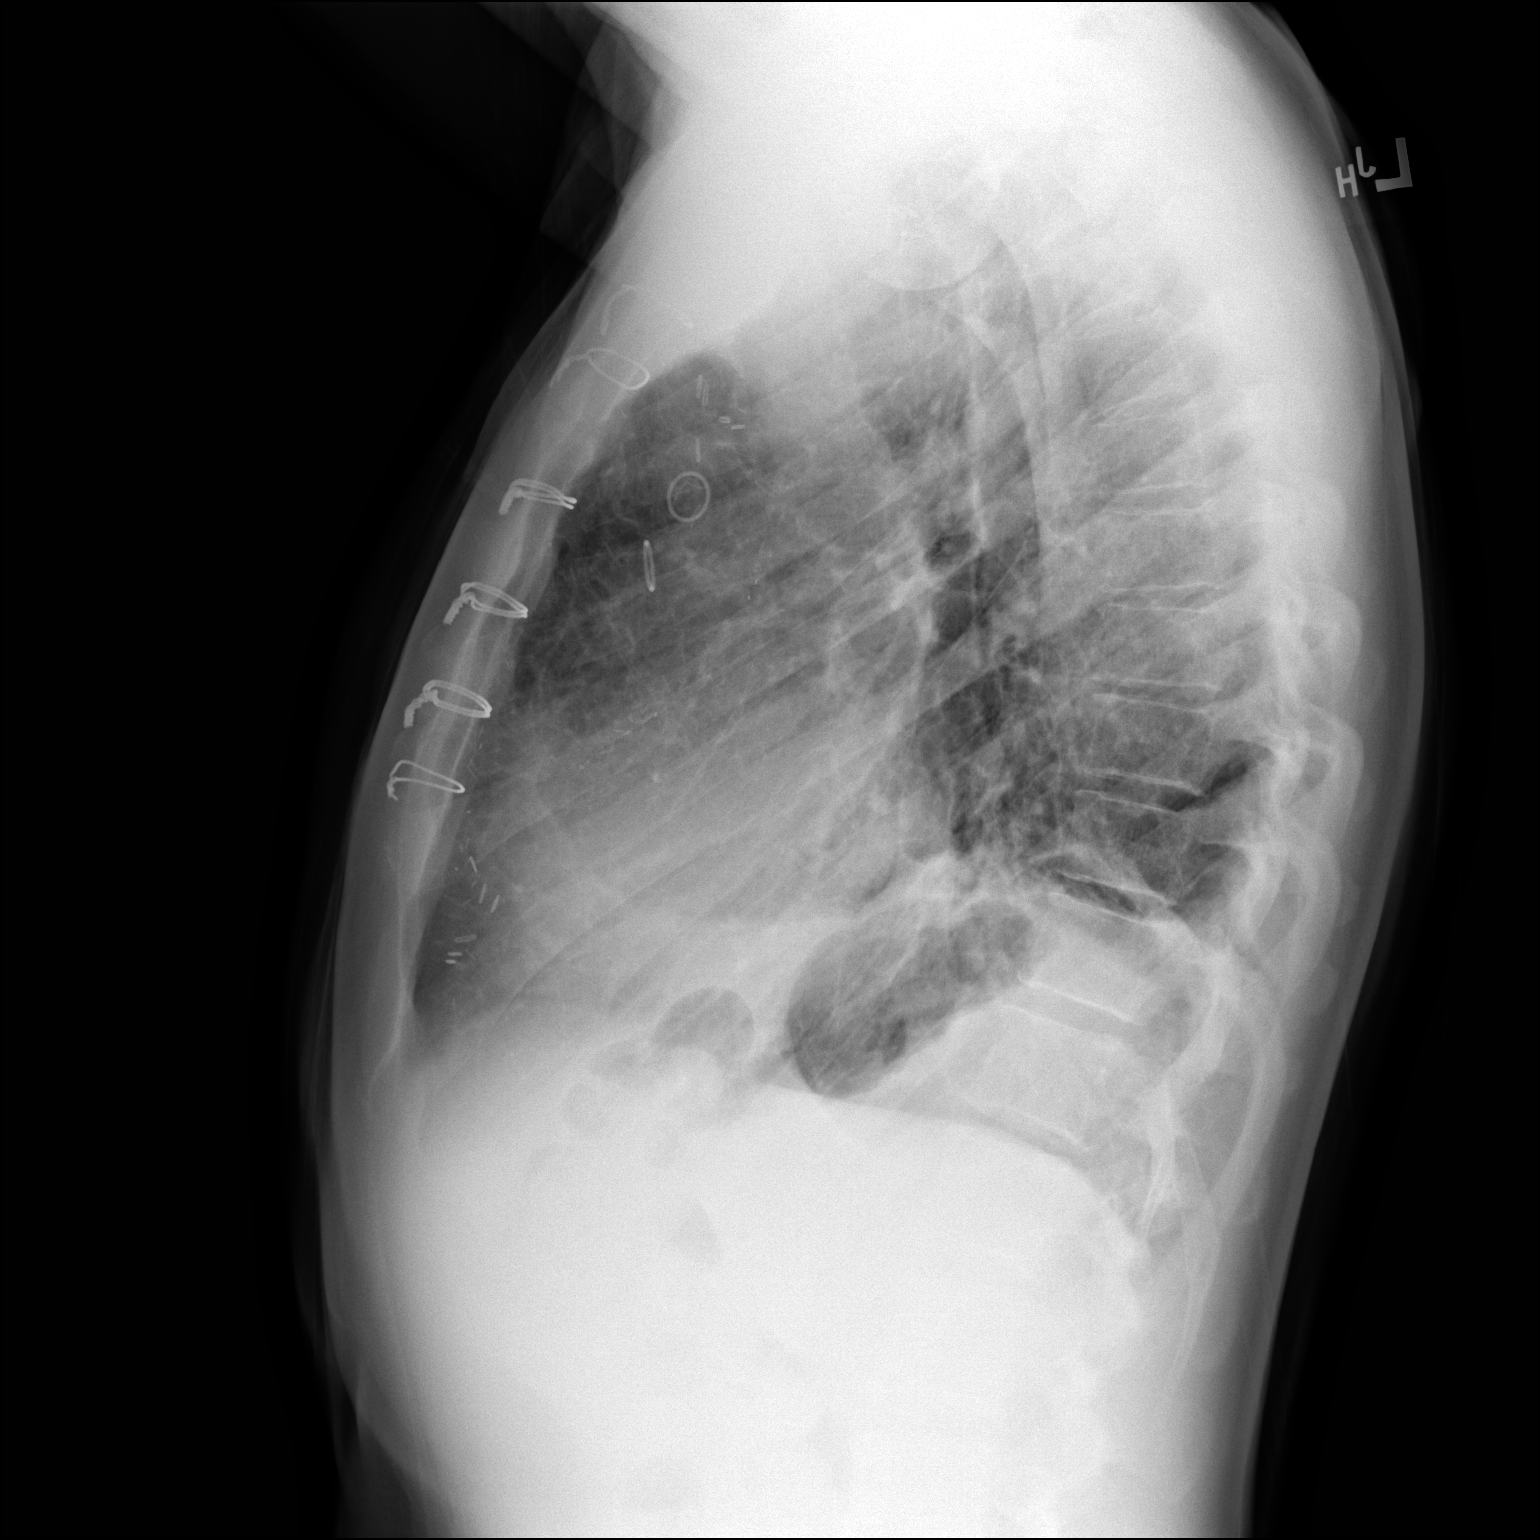

[2 of 2 positions shown; findings below may reference images not displayed]

FINDINGS: Cardiac shadow is stable. Postsurgical changes are again seen.
Surgical staples have been removed in the right subclavicular
region. Elevation of left hemidiaphragm is noted with some
persistent mild left basilar atelectasis. Small effusion is noted as
well. Right lung remains clear.
IMPRESSION: Stable left basilar atelectasis and effusion. No new focal
abnormality is seen.

## 2022-10-06 ENCOUNTER — Ambulatory Visit (INDEPENDENT_AMBULATORY_CARE_PROVIDER_SITE_OTHER): Payer: 59

## 2022-10-06 DIAGNOSIS — I255 Ischemic cardiomyopathy: Secondary | ICD-10-CM | POA: Diagnosis not present

## 2022-10-11 ENCOUNTER — Encounter (HOSPITAL_COMMUNITY): Payer: Self-pay | Admitting: Cardiology

## 2022-10-11 ENCOUNTER — Ambulatory Visit
Admission: RE | Admit: 2022-10-11 | Discharge: 2022-10-11 | Disposition: A | Discharge status: Home / Self Care | Payer: 59 | Source: Ambulatory Visit | Attending: Cardiology | Admitting: Cardiology

## 2022-10-11 VITALS — BP 98/50 | HR 64 | Wt 145.2 lb

## 2022-10-11 DIAGNOSIS — N183 Chronic kidney disease, stage 3 unspecified: Secondary | ICD-10-CM | POA: Insufficient documentation

## 2022-10-11 DIAGNOSIS — Z951 Presence of aortocoronary bypass graft: Secondary | ICD-10-CM | POA: Insufficient documentation

## 2022-10-11 DIAGNOSIS — Z95810 Presence of automatic (implantable) cardiac defibrillator: Secondary | ICD-10-CM | POA: Insufficient documentation

## 2022-10-11 DIAGNOSIS — I2510 Atherosclerotic heart disease of native coronary artery without angina pectoris: Secondary | ICD-10-CM | POA: Diagnosis not present

## 2022-10-11 DIAGNOSIS — I252 Old myocardial infarction: Secondary | ICD-10-CM | POA: Diagnosis present

## 2022-10-11 DIAGNOSIS — I255 Ischemic cardiomyopathy: Secondary | ICD-10-CM | POA: Insufficient documentation

## 2022-10-11 DIAGNOSIS — I480 Paroxysmal atrial fibrillation: Secondary | ICD-10-CM

## 2022-10-11 DIAGNOSIS — Z79899 Other long term (current) drug therapy: Secondary | ICD-10-CM | POA: Insufficient documentation

## 2022-10-11 DIAGNOSIS — I5022 Chronic systolic (congestive) heart failure: Secondary | ICD-10-CM | POA: Diagnosis not present

## 2022-10-11 DIAGNOSIS — Z7984 Long term (current) use of oral hypoglycemic drugs: Secondary | ICD-10-CM | POA: Diagnosis not present

## 2022-10-11 LAB — CUP PACEART REMOTE DEVICE CHECK
Battery Remaining Longevity: 180 mo
Battery Remaining Percentage: 100 %
Brady Statistic RV Percent Paced: 0 %
Date Time Interrogation Session: 20240115104100
HighPow Impedance: 71 Ohm
Implantable Lead Connection Status: 753985
Implantable Lead Implant Date: 20220413
Implantable Lead Location: 753860
Implantable Lead Model: 183
Implantable Lead Serial Number: 303520
Implantable Pulse Generator Implant Date: 20220413
Lead Channel Impedance Value: 575 Ohm
Lead Channel Pacing Threshold Amplitude: 0.7 V
Lead Channel Pacing Threshold Pulse Width: 0.4 ms
Lead Channel Setting Pacing Amplitude: 2 V
Lead Channel Setting Pacing Pulse Width: 0.4 ms
Lead Channel Setting Sensing Sensitivity: 0.5 mV
Pulse Gen Serial Number: 212369
Zone Setting Status: 755011

## 2022-10-11 LAB — BASIC METABOLIC PANEL
Anion gap: 10 (ref 5–15)
BUN: 28 mg/dL — ABNORMAL HIGH (ref 6–20)
CO2: 27 mmol/L (ref 22–32)
Calcium: 9.3 mg/dL (ref 8.9–10.3)
Chloride: 99 mmol/L (ref 98–111)
Creatinine, Ser: 1.52 mg/dL — ABNORMAL HIGH (ref 0.61–1.24)
GFR, Estimated: 55 mL/min — ABNORMAL LOW (ref >60)
Glucose, Bld: 102 mg/dL — ABNORMAL HIGH (ref 70–99)
Potassium: 4.6 mmol/L (ref 3.5–5.1)
Sodium: 136 mmol/L (ref 135–145)

## 2022-10-11 LAB — LIPID PANEL
Cholesterol: 76 mg/dL (ref 0–200)
HDL: 24 mg/dL — ABNORMAL LOW (ref >40)
LDL Cholesterol: 32 mg/dL (ref 0–99)
Total CHOL/HDL Ratio: 3.2 RATIO
Triglycerides: 102 mg/dL (ref <150)
VLDL: 20 mg/dL (ref 0–40)

## 2022-10-11 LAB — CBC
HCT: 43.8 % (ref 39.0–52.0)
Hemoglobin: 14.5 g/dL (ref 13.0–17.0)
MCH: 30.3 pg (ref 26.0–34.0)
MCHC: 33.1 g/dL (ref 30.0–36.0)
MCV: 91.6 fL (ref 80.0–100.0)
Platelets: 182 10*3/uL (ref 150–400)
RBC: 4.78 MIL/uL (ref 4.22–5.81)
RDW: 13.3 % (ref 11.5–15.5)
WBC: 10 10*3/uL (ref 4.0–10.5)
nRBC: 0 % (ref 0.0–0.2)

## 2022-10-11 LAB — DIGOXIN LEVEL: Digoxin Level: 0.4 ng/mL — ABNORMAL LOW (ref 0.8–2.0)

## 2022-10-11 MED ORDER — TORSEMIDE 20 MG PO TABS: 10.0000 mg | ORAL_TABLET | ORAL | Status: DC

## 2022-10-11 NOTE — Patient Instructions (Signed)
DECREASE Torsemide 10 mg every other day.  Labs done today, your results will be available in MyChart, we will contact you for abnormal readings.  Repeat blood work in 3 months.  Your physician has requested that you have an echocardiogram. Echocardiography is a painless test that uses sound waves to create images of your heart. It provides your doctor with information about the size and shape of your heart and how well your heart's chambers and valves are working. This procedure takes approximately one hour. There are no restrictions for this procedure. Please do NOT wear cologne, perfume, aftershave, or lotions (deodorant is allowed). Please arrive 15 minutes prior to your appointment time.  Your physician recommends that you schedule a follow-up appointment in: 4 months with an echocardiogram (May 2024)  ** please call the office March to arrange your follow up appointment.**  If you have any questions or concerns before your next appointment please send [place] a message through [place] or call our office at [phone].    TO LEAVE A MESSAGE FOR THE NURSE SELECT OPTION 2, PLEASE LEAVE A MESSAGE INCLUDING: YOUR NAME DATE OF BIRTH CALL BACK NUMBER REASON FOR CALL**this is important as we prioritize the call backs  YOU WILL RECEIVE A CALL BACK THE SAME DAY AS LONG AS YOU CALL BEFORE 4:00 PM  At the [facility], you and your health needs are our priority. As part of our continuing mission to provide you with exceptional heart care, we have created designated Provider Care Teams. These Care Teams include your primary Cardiologist (physician) and Advanced Practice Providers (APPs- Physician Assistants and Nurse Practitioners) who all work together to provide you with the care you need, when you need it.   You may see any of the following providers on your designated Care Team at your next follow up: Dr Glori Bickers Dr Loralie Champagne Dr. Roxana Hires,  NP Lyda Jester, [place] [facility] [place], [place] Forestine Na, NP Audry Riles, PharmD   Please be sure to bring in all your medications bottles to every appointment.

## 2022-10-11 NOTE — Progress Notes (Signed)
Advanced Heart Failure Clinic Note  PCP: Kathyrn Lass, MD HF Cardiology: Dr. Aundra Dubin  CT Surgery: Dr. Roxan Hockey   52 y.o. male s/p back surgery 05/2020 was admitted for acute inferior and anterolateral MI on 06/25/20. LHC showed thrombotic occlusion of distal RCA that was treated with PTCA and thrombectomy.  He was also noted to have chronic total occlusion of the LAD with collaterals from the right (thus this territory was affected by the RCA MI) as well as 90% complex proximal LCx stenosis. EF 25-30% + apical thrombus and severe mitral regurgitation. RV normal. Also w/ post MI pericarditis, treated w/ colchicine. Course further c/b cardiogenic shock and acute hypoxic respiratory failure w/ component of septic shock 2/2 PNA, requiring intubation and treatment w/ abx. Once stabilized, he underwent CABG x 3 (LIMA-LAD, Lt radial-OM1 and sequential SVG-PDA/PLA) + MV repair and placement of Impella 5.5 on 10/13 by Dr. Roxan Hockey.   Post operatively, he developed tamponade and was taken back to the OR on 10/14 with large hematoma obstructing right atrium. He improved and was extubated 10/19.  Impella removal 10/21 w/ stable hemodynamics and able to tolerate initiation of GDMT.  Also had post-operative afib treated w/ amiodarone. He was discharged home on 07/21/20.   Echo in 3/22 showed EF 20-25% with diffuse hypokinesis, mildly decreased RV systolic function, no LV thrombus, normal IVC, s/p MV repair with trivial MR and mild stenosis (mean gradient 4 mmHg).  [place] placed in 4/22.  Later in 4/22, he had L4-L5 spinal fusion.   CPX in 10/22 showed mild-moderate HF limitation. Echo 4/23 EF 25-30%, no LV thrombus, normal RV.   Today he returns for HF follow up with his girlfriend.  He seems to be doing better symptomatically.  Has changed his diet a lot, weight down 9 lbs.  He has no significant exertional dyspnea.  No orthopnea/PND.  No chest pain.  He is working full time as a Dealer.  He  still has occasional lightheadedness if he stands too fast but this seems stable and is not particularly bothersome.  However, he did have 1 episode before Christmas where he stood up fast and passed out transiently.  No arrhythmic event on device interrogation.   Labs (11/21): LDL 59, HDL 34 Labs (1/22): digoxin level 1.2 Labs (2/22): K 4.3, creatinine 1.1 Labs (4/22): K 5, creatinine 1.02, hgb 11.8 Labs (5/22): K 4.1, creatinine 0.88, hgb 15.5, digoxin 0.4 Labs (9/22): LDL 34, TGs 315 Labs (11/22): K 3.6, creatinine 1.22 => 1.77 Labs (3/23): TGs 350, LDL 8 Labs (4/23): K 4.6, creatinine 1.25, LDL 44, HDL 23 Labs (9/23): K 4, creatinine 1.82 => 1.62 Labs (10/23): creatinine 1.25 => 1.56  Review of systems complete and found to be negative unless listed in HPI.    PMH: 1. Anxiety 2. Hyperlipidemia 3. CAD: Inferior and anterolateral MI 10/21.  LHC showed thrombotic occlusion distal RCA treated with PTCA/thrombectomy, CTO LAD with R=>L collaterals, 90% complex proximal LCx stenosis.  - CABG x 4 in 10/21 with LIMA-LAD, left radial-OM1, sequential SVG-PDA/PLV.  4. Mitral regurgitation: Suspect infarct-related MR.   - Mitral valve repair in 10/21 with CABG.  5. LV thrombus  6. Chronic systolic CHF: ischemic cardiomyopathy.  [place].  Echo (10/21) with EF 25-30%, apical thrombus, severe MR, normal RV.   - Echo (3/22): EF 20-25% with diffuse hypokinesis, mildly decreased RV systolic function, no LV thrombus, normal IVC, s/p MV repair with trivial MR and mild stenosis (mean gradient 4 mmHg).   -  CPX (10/22): RER 1.17, VE/VCO2 slope 31, peak VO2 22.7 => mild-moderate HF limitation.  - Echo (4/23): EF 25-30%, no LV thrombus, normal RV. 7. Post-infarct pericarditis in 10/21. 8. Atrial fibrillation: Post-op in 10/21.  9. H/o back surgery 9/21: Discectomy.   10. CKD stage 3  Current Outpatient Medications  Medication Sig Dispense Refill   acetaminophen (TYLENOL) 325 MG tablet Take  2 tablets (650 mg total) by mouth every 6 (six) hours as needed for mild pain.     ALPRAZolam (XANAX) 1 MG tablet Take 1 mg by mouth at bedtime as needed for anxiety.     atorvastatin (LIPITOR) 80 MG tablet Take 1 tablet (80 mg total) by mouth daily. 90 tablet 1   carvedilol (COREG) 6.25 MG tablet TAKE 1 TABLET(6.25 MG) BY MOUTH TWICE DAILY WITH A MEAL 60 tablet 4   digoxin (LANOXIN) 0.125 MG tablet Take 0.5 tablets (0.0625 mg total) by mouth daily. 15 tablet 11   icosapent Ethyl (VASCEPA) 1 g capsule Take 2 capsules (2 g total) by mouth 2 (two) times daily. 120 capsule 11   JARDIANCE 10 MG TABS tablet TAKE 1 TABLET(10 MG) BY MOUTH DAILY BEFORE BREAKFAST 30 tablet 11   linaclotide (LINZESS) 145 MCG CAPS capsule Take 145 mcg by mouth daily before breakfast.     Multiple Vitamin (MULTIVITAMIN WITH MINERALS) TABS tablet Take 1 tablet by mouth in the morning.     sacubitril-valsartan (ENTRESTO) 49-51 MG Take 1 tablet by mouth 2 (two) times daily. 60 tablet 11   spironolactone (ALDACTONE) 25 MG tablet TAKE 1 TABLET(25 MG) BY MOUTH AT BEDTIME 90 tablet 3   traZODone (DESYREL) 100 MG tablet Take 100 mg by mouth at bedtime.     warfarin (COUMADIN) 2.5 MG tablet TAKE 1 TO 1 AND 1/2 TABLETS BY MOUTH DAILY AS DIRECTED BY COAGULATION CLINIC 40 tablet 3   torsemide (DEMADEX) 20 MG tablet Take 0.5 tablets (10 mg total) by mouth every other day. Takes additional tablets if weight gain     No current facility-administered medications for this encounter.   Allergies  Allergen Reactions   Fenofibrate Other (See Comments)    GI upset, Joint pain    Social History   Socioeconomic History   Marital status: Single    Spouse name: Not on file   Number of children: Not on file   Years of education: Not on file   Highest education level: Not on file  Occupational History    Employer: [employer]  Tobacco Use   Smoking status: Never   Smokeless tobacco: Never  Vaping Use   Vaping Use: Never used   Substance and Sexual Activity   Alcohol use: Yes    Alcohol/week: 1.0 standard drink of alcohol    Types: 1 Standard drinks or equivalent per week   Drug use: No   Sexual activity: Yes    Birth control/protection: None  Other Topics Concern   Not on file  Social History Narrative   Not on file   Social Determinants of Health   Financial Resource Strain: Not on file  Food Insecurity: Not on file  Transportation Needs: Not on file  Physical Activity: Not on file  Stress: Not on file  Social Connections: Not on file  Intimate Partner Violence: Not on file   FH: No family history of premature CAD.   BP (!) 98/50   Pulse 64   Wt 65.9 kg (145 lb 3.2 oz)   SpO2 96%  BMI 22.08 kg/m   Wt Readings from Last 3 Encounters:  10/11/22 65.9 kg (145 lb 3.2 oz)  07/11/22 70 kg (154 lb 6.4 oz)  07/03/22 67 kg (147 lb 12.8 oz)   PHYSICAL EXAM: General: NAD Neck: No JVD, no thyromegaly or thyroid nodule.  Lungs: Clear to auscultation bilaterally with normal respiratory effort. CV: Nondisplaced PMI.  Heart regular S1/S2, no S3/S4, no murmur.  No peripheral edema.  No carotid bruit.  Normal pedal pulses.  Abdomen: Soft, nontender, no hepatosplenomegaly, no distention.  Skin: Intact without lesions or rashes.  Neurologic: Alert and oriented x 3.  Psych: Normal affect. Extremities: No clubbing or cyanosis.  HEENT: Normal.   ASSESSMENT & PLAN: 1. CAD: Late presentation inferior and anterolateral MI 10/21.  Cath showed thrombotic occlusion distal RCA, CTO LAD with collaterals from right, and complex 90% proximal LCx stenosis.  He had PTCA/thrombectomy RCA with good flow at end of procedure.  Suspect he had damage to LAD territory as well as RCA territory as RCA collateralized the LAD.  S/p CABG w/ impella support with LIMA-LAD, left radial to OM, seq SVG-PDA/PLV. Post op course c/b tamponade and was taken back to the OR on 10/14 for drainage, found to have large hematoma obstructing right  atrium.  No chest pain.  - With warfarin use, he is not taking ASA.  - Continue atorvastatin 80 mg daily. Check lipids today.  2. 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 Echo in 10/21 with EF 25-30%.  Echo in 3/22 showed EF 20-25%, diffuse hypokinesis, mildly decreased RV systolic function.  He now has a Environmental manager ICD.  CPX in 10/22 with mild-moderate HF limitation.  Echo 4/23 showed EF 25-30%, no LV thrombus, normal RV.  NYHA class I-II symptoms, not volume overloaded on exam.  He still has some orthostatic symptoms but they are manageable.  - Continue Entresto 49/51 bid, had to decrease from highest dose due to lightheadedness.   - Decrease torsemide to 10 mg every other day, this may help with orthostatic symptoms.  - Continue spironolactone 25 mg qhs. - Continue Coreg 6.25 mg bid. - Continue digoxin 0.0625 mg daily. Check level today.  - Continue Jardiance 10 mg daily.  - Narrow QRS so not CRT candidate.    - He would likely be a baroreceptor activation therapy or cardiac contractility monitor candidate if he were to worsen, not interested at this time and minimal symptoms.  - Repeat echo at followup in 4 months.  3. Severe MR: Suspect infarct-related MR. s/p MV repair at time of CABG 10/21.  Mild MR on 4/23 echo.  4. LV thrombus: Not present on 4/23 echo.  - Continue warfarin.  5. Post-operative atrial fibrillation: Regular on exam today. He is on warfarin, off amiodarone.  6. Hypertriglyceridemia: Unable to tolerate fenofibrate due to GI upset.  Lipids today.    Follow up in 4 months with echo  Marca Ancona, MD 10/11/22

## 2022-10-12 ENCOUNTER — Ambulatory Visit: Payer: 59

## 2022-10-18 ENCOUNTER — Ambulatory Visit: Payer: 59 | Attending: Cardiovascular Disease

## 2022-10-18 DIAGNOSIS — I4891 Unspecified atrial fibrillation: Secondary | ICD-10-CM | POA: Diagnosis not present

## 2022-10-18 DIAGNOSIS — Z5181 Encounter for therapeutic drug level monitoring: Secondary | ICD-10-CM | POA: Diagnosis not present

## 2022-10-18 DIAGNOSIS — I824Y2 Acute embolism and thrombosis of unspecified deep veins of left proximal lower extremity: Secondary | ICD-10-CM | POA: Diagnosis not present

## 2022-10-18 LAB — POCT INR: INR: 1.7 — AB (ref 2.0–3.0)

## 2022-10-18 MED ORDER — WARFARIN SODIUM 2.5 MG PO TABS: ORAL_TABLET | ORAL | 2 refills | Status: DC

## 2022-10-18 NOTE — Patient Instructions (Addendum)
Description   Take 2 tablets today and then continue taking warfarin 1 tablet daily except 1.5 tablets on Sundays and Wednesdays.  Recheck INR in 5 weeks (per pt request, aware of risk)  [facility] [phone]

## 2022-10-24 NOTE — Progress Notes (Signed)
Remote ICD transmission.   

## 2022-11-20 ENCOUNTER — Other Ambulatory Visit (HOSPITAL_COMMUNITY): Payer: Self-pay

## 2022-11-20 MED ORDER — SPIRONOLACTONE 25 MG PO TABS: ORAL_TABLET | ORAL | 3 refills | Status: DC

## 2022-11-27 ENCOUNTER — Ambulatory Visit: Payer: 59 | Attending: Cardiovascular Disease

## 2022-11-27 ENCOUNTER — Telehealth: Payer: Self-pay | Admitting: *Deleted

## 2022-11-27 NOTE — Telephone Encounter (Signed)
Called pt since he missed appt today. There was no answer therefore, left a message to call back to reschedule.

## 2022-12-07 ENCOUNTER — Other Ambulatory Visit (HOSPITAL_COMMUNITY): Payer: Self-pay | Admitting: Cardiology

## 2022-12-08 ENCOUNTER — Ambulatory Visit: Payer: 59 | Admitting: *Deleted

## 2022-12-08 ENCOUNTER — Ambulatory Visit: Payer: 59 | Attending: Cardiovascular Disease | Admitting: *Deleted

## 2022-12-08 DIAGNOSIS — I4891 Unspecified atrial fibrillation: Secondary | ICD-10-CM

## 2022-12-08 DIAGNOSIS — I824Y2 Acute embolism and thrombosis of unspecified deep veins of left proximal lower extremity: Secondary | ICD-10-CM | POA: Diagnosis not present

## 2022-12-08 DIAGNOSIS — Z5181 Encounter for therapeutic drug level monitoring: Secondary | ICD-10-CM | POA: Diagnosis not present

## 2022-12-08 LAB — POCT INR: POC INR: 2.4

## 2022-12-08 NOTE — Patient Instructions (Signed)
Description   Continue taking warfarin 1 tablet daily except 1.5 tablets on Sundays and Wednesdays.  Recheck INR in 5 weeks (per pt request, aware of risk)  [facility] [phone]

## 2023-01-01 ENCOUNTER — Ambulatory Visit (HOSPITAL_COMMUNITY)
Admission: RE | Admit: 2023-01-01 | Discharge: 2023-01-01 | Disposition: A | Discharge status: Home / Self Care | Payer: 59 | Source: Ambulatory Visit | Attending: Cardiology | Admitting: Cardiology

## 2023-01-01 DIAGNOSIS — I5022 Chronic systolic (congestive) heart failure: Secondary | ICD-10-CM | POA: Diagnosis present

## 2023-01-01 LAB — LIPID PANEL
Cholesterol: 92 mg/dL (ref 0–200)
HDL: 28 mg/dL — ABNORMAL LOW (ref >40)
LDL Cholesterol: 40 mg/dL (ref 0–99)
Total CHOL/HDL Ratio: 3.3 RATIO
Triglycerides: 120 mg/dL (ref <150)
VLDL: 24 mg/dL (ref 0–40)

## 2023-01-05 ENCOUNTER — Ambulatory Visit (INDEPENDENT_AMBULATORY_CARE_PROVIDER_SITE_OTHER): Payer: 59

## 2023-01-05 DIAGNOSIS — I255 Ischemic cardiomyopathy: Secondary | ICD-10-CM

## 2023-01-05 LAB — CUP PACEART REMOTE DEVICE CHECK
Battery Remaining Longevity: 174 mo
Battery Remaining Percentage: 100 %
Brady Statistic RV Percent Paced: 0 %
Date Time Interrogation Session: 20240412040000
HighPow Impedance: 64 Ohm
Implantable Lead Connection Status: 753985
Implantable Lead Implant Date: 20220413
Implantable Lead Location: 753860
Implantable Lead Model: 183
Implantable Lead Serial Number: 303520
Implantable Pulse Generator Implant Date: 20220413
Lead Channel Impedance Value: 544 Ohm
Lead Channel Pacing Threshold Amplitude: 0.6 V
Lead Channel Pacing Threshold Pulse Width: 0.4 ms
Lead Channel Setting Pacing Amplitude: 2 V
Lead Channel Setting Pacing Pulse Width: 0.4 ms
Lead Channel Setting Sensing Sensitivity: 0.5 mV
Pulse Gen Serial Number: 212369
Zone Setting Status: 755011

## 2023-01-12 ENCOUNTER — Ambulatory Visit: Payer: 59 | Attending: Cardiology | Admitting: *Deleted

## 2023-01-12 DIAGNOSIS — Z5181 Encounter for therapeutic drug level monitoring: Secondary | ICD-10-CM | POA: Diagnosis not present

## 2023-01-12 DIAGNOSIS — I824Y2 Acute embolism and thrombosis of unspecified deep veins of left proximal lower extremity: Secondary | ICD-10-CM | POA: Diagnosis not present

## 2023-01-12 DIAGNOSIS — I4891 Unspecified atrial fibrillation: Secondary | ICD-10-CM

## 2023-01-12 LAB — POCT INR: POC INR: 2

## 2023-01-12 NOTE — Patient Instructions (Signed)
Description   Continue taking warfarin 1 tablet daily except 1.5 tablets on Sundays and Wednesdays.  Recheck INR in 5 weeks. [facility] [phone]

## 2023-01-15 ENCOUNTER — Other Ambulatory Visit: Payer: Self-pay | Admitting: Cardiology

## 2023-01-15 DIAGNOSIS — I4891 Unspecified atrial fibrillation: Secondary | ICD-10-CM

## 2023-02-08 NOTE — Progress Notes (Signed)
Remote ICD transmission.   

## 2023-02-12 ENCOUNTER — Other Ambulatory Visit (HOSPITAL_COMMUNITY): Payer: Self-pay | Admitting: Cardiology

## 2023-02-16 ENCOUNTER — Ambulatory Visit: Payer: 59 | Attending: Cardiology

## 2023-02-16 DIAGNOSIS — I824Y2 Acute embolism and thrombosis of unspecified deep veins of left proximal lower extremity: Secondary | ICD-10-CM | POA: Diagnosis not present

## 2023-02-16 DIAGNOSIS — I4891 Unspecified atrial fibrillation: Secondary | ICD-10-CM | POA: Diagnosis not present

## 2023-02-16 DIAGNOSIS — Z5181 Encounter for therapeutic drug level monitoring: Secondary | ICD-10-CM | POA: Diagnosis not present

## 2023-02-16 LAB — POCT INR: INR: 2.7 (ref 2.0–3.0)

## 2023-02-16 NOTE — Patient Instructions (Signed)
Continue taking warfarin 1 tablet daily except 1.5 tablets on Sundays and Wednesdays.  Recheck INR in 6 weeks. [facility] [phone]

## 2023-03-20 ENCOUNTER — Other Ambulatory Visit: Payer: Self-pay | Admitting: Cardiovascular Disease

## 2023-03-20 DIAGNOSIS — I4891 Unspecified atrial fibrillation: Secondary | ICD-10-CM

## 2023-03-30 ENCOUNTER — Ambulatory Visit: Payer: 59 | Attending: Cardiology

## 2023-03-30 DIAGNOSIS — I824Y2 Acute embolism and thrombosis of unspecified deep veins of left proximal lower extremity: Secondary | ICD-10-CM | POA: Diagnosis not present

## 2023-03-30 DIAGNOSIS — Z5181 Encounter for therapeutic drug level monitoring: Secondary | ICD-10-CM | POA: Diagnosis not present

## 2023-03-30 DIAGNOSIS — I4891 Unspecified atrial fibrillation: Secondary | ICD-10-CM

## 2023-03-30 LAB — POCT INR: INR: 3.5 — AB (ref 2.0–3.0)

## 2023-03-30 NOTE — Patient Instructions (Signed)
HOLD TOMORROW ONLY THEN Continue taking warfarin 1 tablet daily except 1.5 tablets on Sundays and Wednesdays.  Recheck INR in 6 weeks. [facility] [phone]

## 2023-04-02 ENCOUNTER — Other Ambulatory Visit (HOSPITAL_COMMUNITY): Payer: Self-pay

## 2023-04-02 MED ORDER — ICOSAPENT ETHYL 1 G PO CAPS: 2.0000 g | ORAL_CAPSULE | Freq: Two times a day (BID) | ORAL | 11 refills | Status: AC

## 2023-04-05 ENCOUNTER — Telehealth (HOSPITAL_COMMUNITY): Payer: Self-pay

## 2023-04-05 ENCOUNTER — Other Ambulatory Visit (HOSPITAL_COMMUNITY): Payer: Self-pay

## 2023-04-05 NOTE — Telephone Encounter (Signed)
Patient Advocate Encounter  Prior authorization for Icosapent has been submitted and approved. Test billing returns $50 for 30 day supply.  Key: [id number] Effective: 04/04/2023 to 04/03/2024  Burnell Blanks, CPhT Rx Patient Advocate Phone: [phone]

## 2023-04-06 ENCOUNTER — Ambulatory Visit: Payer: 59

## 2023-04-06 DIAGNOSIS — I255 Ischemic cardiomyopathy: Secondary | ICD-10-CM

## 2023-04-06 LAB — CUP PACEART REMOTE DEVICE CHECK
Battery Remaining Longevity: 168 mo
Battery Remaining Percentage: 100 %
Brady Statistic RV Percent Paced: 0 %
Date Time Interrogation Session: 20240712040100
HighPow Impedance: 69 Ohm
Implantable Lead Connection Status: 753985
Implantable Lead Implant Date: 20220413
Implantable Lead Location: 753860
Implantable Lead Model: 183
Implantable Lead Serial Number: 303520
Implantable Pulse Generator Implant Date: 20220413
Lead Channel Impedance Value: 568 Ohm
Lead Channel Pacing Threshold Amplitude: 0.7 V
Lead Channel Pacing Threshold Pulse Width: 0.4 ms
Lead Channel Setting Pacing Amplitude: 2 V
Lead Channel Setting Pacing Pulse Width: 0.4 ms
Lead Channel Setting Sensing Sensitivity: 0.5 mV
Pulse Gen Serial Number: 212369
Zone Setting Status: 755011

## 2023-04-20 NOTE — Progress Notes (Signed)
Remote ICD transmission.   

## 2023-05-10 ENCOUNTER — Encounter (HOSPITAL_COMMUNITY): Payer: Self-pay

## 2023-05-10 ENCOUNTER — Ambulatory Visit (HOSPITAL_COMMUNITY)
Admission: RE | Admit: 2023-05-10 | Discharge: 2023-05-10 | Disposition: A | Discharge status: Home / Self Care | Payer: 59 | Source: Ambulatory Visit | Attending: Cardiology | Admitting: Cardiology

## 2023-05-10 ENCOUNTER — Ambulatory Visit (HOSPITAL_BASED_OUTPATIENT_CLINIC_OR_DEPARTMENT_OTHER)
Admission: RE | Admit: 2023-05-10 | Discharge: 2023-05-10 | Disposition: A | Discharge status: Home / Self Care | Payer: 59 | Source: Ambulatory Visit | Attending: Cardiology | Admitting: Cardiology

## 2023-05-10 VITALS — BP 102/97 | HR 63 | Wt 146.8 lb

## 2023-05-10 DIAGNOSIS — Z79899 Other long term (current) drug therapy: Secondary | ICD-10-CM | POA: Insufficient documentation

## 2023-05-10 DIAGNOSIS — I5022 Chronic systolic (congestive) heart failure: Secondary | ICD-10-CM

## 2023-05-10 DIAGNOSIS — Z95810 Presence of automatic (implantable) cardiac defibrillator: Secondary | ICD-10-CM | POA: Insufficient documentation

## 2023-05-10 DIAGNOSIS — N183 Chronic kidney disease, stage 3 unspecified: Secondary | ICD-10-CM | POA: Insufficient documentation

## 2023-05-10 DIAGNOSIS — Z7984 Long term (current) use of oral hypoglycemic drugs: Secondary | ICD-10-CM | POA: Diagnosis not present

## 2023-05-10 DIAGNOSIS — Z7901 Long term (current) use of anticoagulants: Secondary | ICD-10-CM | POA: Insufficient documentation

## 2023-05-10 DIAGNOSIS — I130 Hypertensive heart and chronic kidney disease with heart failure and stage 1 through stage 4 chronic kidney disease, or unspecified chronic kidney disease: Secondary | ICD-10-CM | POA: Diagnosis not present

## 2023-05-10 DIAGNOSIS — E781 Pure hyperglyceridemia: Secondary | ICD-10-CM | POA: Diagnosis not present

## 2023-05-10 DIAGNOSIS — I252 Old myocardial infarction: Secondary | ICD-10-CM | POA: Diagnosis not present

## 2023-05-10 DIAGNOSIS — I2510 Atherosclerotic heart disease of native coronary artery without angina pectoris: Secondary | ICD-10-CM | POA: Diagnosis not present

## 2023-05-10 LAB — ECHOCARDIOGRAM COMPLETE
AR max vel: 3.38 cm2
AV Area VTI: 3.7 cm2
AV Area mean vel: 3.32 cm2
AV Mean grad: 2 mmHg
AV Peak grad: 3.3 mmHg
Ao pk vel: 0.9 m/s
Area-P 1/2: 2.26 cm2
Est EF: 25
MV VTI: 1.55 cm2
S' Lateral: 5.2 cm

## 2023-05-10 LAB — BASIC METABOLIC PANEL
Anion gap: 8 (ref 5–15)
BUN: 21 mg/dL — ABNORMAL HIGH (ref 6–20)
CO2: 26 mmol/L (ref 22–32)
Calcium: 9.3 mg/dL (ref 8.9–10.3)
Chloride: 102 mmol/L (ref 98–111)
Creatinine, Ser: 1.22 mg/dL (ref 0.61–1.24)
GFR, Estimated: >60 mL/min (ref >60)
Glucose, Bld: 93 mg/dL (ref 70–99)
Potassium: 4.6 mmol/L (ref 3.5–5.1)
Sodium: 136 mmol/L (ref 135–145)

## 2023-05-10 LAB — DIGOXIN LEVEL: Digoxin Level: 0.5 ng/mL — ABNORMAL LOW (ref 0.8–2.0)

## 2023-05-10 MED ORDER — PERFLUTREN LIPID MICROSPHERE
1.0000 mL | INTRAVENOUS | Status: DC | PRN
  Administered 2023-05-10: 4 mL via INTRAVENOUS

## 2023-05-10 NOTE — Patient Instructions (Signed)
Medication Changes:  No Changes In Medications at this time.   Lab Work:  Labs done today, your results will be available in MyChart, we will contact you for abnormal readings.  Testing/Procedures:  Your physician has recommended that you have a cardiopulmonary stress test (CPX). CPX testing is a non-invasive measurement of heart and lung function. It replaces a traditional treadmill stress test. This type of test provides a tremendous amount of information that relates not only to your present condition but also for future outcomes. This test combines measurements of you ventilation, respiratory gas exchange in the lungs, electrocardiogram (EKG), blood pressure and physical response before, during, and following an exercise protocol.  You are scheduled for a Cardiopulmonary Exercise (CPX) Test as [facility] on: Date:      Time:   Expect to be in the lab for 2 hours. Please plan to arrive 30 minutes prior to your appointment. You may be asked to reschedule your test if you arrive 20 minutes or more after your scheduled appointment time.  Main Campus address: [address] [place], [place] [address] You may arrive to the Main Entrance A or Entrance C (free valet parking is available at both). -Main Entrance A (on [address]) :proceed to admitting for check in -Entrance C (on [facility]): proceed to [employer] parking or under hospital deck parking using this code _________  Check In: [facility] waiting room (1st floor)   General Instructions for the day of the test (Please follow all instructions from your physician): Refrain from ingesting a heavy meal, alcohol, or caffeine or using tobacco products within 2 hours of the test (DO NOT FAST for mare than 8 hours). You may have all other non-alcoholic, non -caffeinated beverage,a light snack (crackers,a piece of fruit, carrot sticks, toast bagel,etc) up to your appointment. Avoid significant exertion or exercise within  24 hours of your test. Be prepared to exercise and sweat. Your clothing should permit freedom of movement and include walking or running shoes. Women bring loose fitting short sleeved blouse.  This evaluation may be fatiguing and you may wish ti have someone accompany you to the assessment to drive you home afterward. Bring a list of your medications with you, including dosage and frequency you take the medications (  I.e.,once per day, twice per day, etc). Take all medications as prescribed, unless noted below or instructed to do so by your physician.  Please do not take the following medications prior to your CPX:  _________________________________________________  _________________________________________________  Brief description of the test: A brief lung test will be performed. This will involve you taking deep breaths and blowing hard and fast through your mouth. During these , a clip will be on your nose and you will be breathing through a breathing device.   For the exercise portion of the test you will be walking on a treadmill, or riding a stationary bike, to your maximal effor or until symptoms such as chest pain, shortness of breath, leg pain or dizziness limit your exercise. You will be breathing in and out of a breathing device through your mouth (a clip will be on your nose again). Your heart rate, ECG, blood pressure, oxygen saturations, breathing rate and depth, amount of oxygen you consume and amount of carbon dioxide you produce will be measured and monitored throughout the exercise test.  If you need to cancel or reschedule your appointment please call [phone] If you have further questions please call your physician or  Philip Aspen, MS, ACSM-RCEP at [phone]  Follow-Up in: 3 months with Dr. Shirlee Latch  At the [facility], you and your health needs are our priority. We have a designated team specialized in the treatment of Heart Failure. This Care Team  includes your primary Heart Failure Specialized Cardiologist (physician), Advanced Practice Providers (APPs- Physician Assistants and Nurse Practitioners), and Pharmacist who all work together to provide you with the care you need, when you need it.   You may see any of the following providers on your designated Care Team at your next follow up:  Dr. Arvilla Meres Dr. Marca Ancona Dr. Marcos Eke, NP Robbie Lis, [place] [facility], [place] Brynda Peon, NP Karle Plumber, PharmD   Please be sure to bring in all your medications bottles to every appointment.   Need to Contact us:  If you have any questions or concerns before your next appointment please send [place] a message through [place] or call our office at [phone].    TO LEAVE A MESSAGE FOR THE NURSE SELECT OPTION 2, PLEASE LEAVE A MESSAGE INCLUDING: YOUR NAME DATE OF BIRTH CALL BACK NUMBER REASON FOR CALL**this is important as we prioritize the call backs  YOU WILL RECEIVE A CALL BACK THE SAME DAY AS LONG AS YOU CALL BEFORE 4:00 PM

## 2023-05-10 NOTE — Progress Notes (Signed)
Advanced Heart Failure Clinic Note  PCP: Sigmund Hazel, MD HF Cardiology: Dr. Shirlee Latch  CT Surgery: Dr. Dorris Fetch   52 y.o. male s/p back surgery 05/2020 was admitted for acute inferior and anterolateral MI on 06/25/20. LHC showed thrombotic occlusion of distal RCA that was treated with PTCA and thrombectomy.  He was also noted to have chronic total occlusion of the LAD with collaterals from the right (thus this territory was affected by the RCA MI) as well as 90% complex proximal LCx stenosis. EF 25-30% + apical thrombus and severe mitral regurgitation. RV normal. Also w/ post MI pericarditis, treated w/ colchicine. Course further c/b cardiogenic shock and acute hypoxic respiratory failure w/ component of septic shock 2/2 PNA, requiring intubation and treatment w/ abx. Once stabilized, he underwent CABG x 3 (LIMA-LAD, Lt radial-OM1 and sequential SVG-PDA/PLA) + MV repair and placement of Impella 5.5 on 10/13 by Dr. Dorris Fetch.   Post operatively, he developed tamponade and was taken back to the OR on 10/14 with large hematoma obstructing right atrium. He improved and was extubated 10/19.  Impella removal 10/21 w/ stable hemodynamics and able to tolerate initiation of GDMT.  Also had post-operative afib treated w/ amiodarone. He was discharged home on 07/21/20.   Echo in 3/22 showed EF 20-25% with diffuse hypokinesis, mildly decreased RV systolic function, no LV thrombus, normal IVC, s/p MV repair with trivial MR and mild stenosis (mean gradient 4 mmHg).  [employer] ICD placed in 4/22.  Later in 4/22, he had L4-L5 spinal fusion.   CPX in 10/22 showed mild-moderate HF limitation. Echo 4/23 EF 25-30%, no LV thrombus, normal RV.   Today he returns for HF follow up with his girlfriend. Reports chronic exertional fatigue, NYHA Class II-early III. Some days worse than others. Volume stable on exam and on device interrogation. HL score 0. No VT. Denies CP. Reports full med compliance. BP low normal.  Reports occasional positional dizziness but no syncope. EKG today shows NSR.    Echo repeated today, EF remains 25%, MV ring stable. Mild MR. No MS. RV normal. IVC not dilated.    Labs (11/21): LDL 59, HDL 34 Labs (1/22): digoxin level 1.2 Labs (2/22): K 4.3, creatinine 1.1 Labs (4/22): K 5, creatinine 1.02, hgb 11.8 Labs (5/22): K 4.1, creatinine 0.88, hgb 15.5, digoxin 0.4 Labs (9/22): LDL 34, TGs 315 Labs (11/22): K 3.6, creatinine 1.22 => 1.77 Labs (3/23): TGs 350, LDL 8 Labs (4/23): K 4.6, creatinine 1.25, LDL 44, HDL 23 Labs (9/23): K 4, creatinine 1.82 => 1.62 Labs (10/23): creatinine 1.25 => 1.56 Labs (1/24): creatinine 1.52, K 4.6 Labs (4/24): TG 120, LDL 40   Review of systems complete and found to be negative unless listed in HPI.    PMH: 1. Anxiety 2. Hyperlipidemia 3. CAD: Inferior and anterolateral MI 10/21.  LHC showed thrombotic occlusion distal RCA treated with PTCA/thrombectomy, CTO LAD with R=>L collaterals, 90% complex proximal LCx stenosis.  - CABG x 4 in 10/21 with LIMA-LAD, left radial-OM1, sequential SVG-PDA/PLV.  4. Mitral regurgitation: Suspect infarct-related MR.   - Mitral valve repair in 10/21 with CABG.  5. LV thrombus  6. Chronic systolic CHF: ischemic cardiomyopathy.  [employer] ICD.  Echo (10/21) with EF 25-30%, apical thrombus, severe MR, normal RV.   - Echo (3/22): EF 20-25% with diffuse hypokinesis, mildly decreased RV systolic function, no LV thrombus, normal IVC, s/p MV repair with trivial MR and mild stenosis (mean gradient 4 mmHg).   - CPX (10/22): RER  1.17, VE/VCO2 slope 31, peak VO2 22.7 => mild-moderate HF limitation.  - Echo (4/23): EF 25-30%, no LV thrombus, normal RV. 7. Post-infarct pericarditis in 10/21. 8. Atrial fibrillation: Post-op in 10/21.  9. H/o back surgery 9/21: Discectomy.   10. CKD stage 3  Current Outpatient Medications  Medication Sig Dispense Refill   acetaminophen (TYLENOL) 325 MG tablet Take 2 tablets  (650 mg total) by mouth every 6 (six) hours as needed for mild pain.     ALPRAZolam (XANAX) 1 MG tablet Take 1 mg by mouth at bedtime as needed for anxiety.     atorvastatin (LIPITOR) 80 MG tablet TAKE 1 TABLET(80 MG) BY MOUTH DAILY 90 tablet 1   carvedilol (COREG) 6.25 MG tablet TAKE 1 TABLET(6.25 MG) BY MOUTH TWICE DAILY WITH A MEAL 60 tablet 4   digoxin (LANOXIN) 0.125 MG tablet Take 0.5 tablets (0.0625 mg total) by mouth daily. 15 tablet 11   icosapent Ethyl (VASCEPA) 1 g capsule Take 2 capsules (2 g total) by mouth 2 (two) times daily. 120 capsule 11   JARDIANCE 10 MG TABS tablet TAKE 1 TABLET(10 MG) BY MOUTH DAILY BEFORE BREAKFAST 30 tablet 11   linaclotide (LINZESS) 145 MCG CAPS capsule Take 145 mcg by mouth daily before breakfast.     Multiple Vitamin (MULTIVITAMIN WITH MINERALS) TABS tablet Take 1 tablet by mouth in the morning.     sacubitril-valsartan (ENTRESTO) 49-51 MG Take 1 tablet by mouth 2 (two) times daily. 60 tablet 11   spironolactone (ALDACTONE) 25 MG tablet TAKE 1 TABLET(25 MG) BY MOUTH AT BEDTIME 90 tablet 3   torsemide (DEMADEX) 20 MG tablet Take 0.5 tablets (10 mg total) by mouth every other day. Takes additional tablets if weight gain     traZODone (DESYREL) 100 MG tablet Take 100 mg by mouth at bedtime.     warfarin (COUMADIN) 2.5 MG tablet TAKE 1 TO 1 AND 1/2 TABLETS BY MOUTH DAILY AS DIRECTED BY COUMADIN CLINIC 100 tablet 0   No current facility-administered medications for this encounter.   Facility-Administered Medications Ordered in Other Encounters  Medication Dose Route Frequency Provider Last Rate Last Admin   perflutren lipid microspheres (DEFINITY) IV suspension  1-10 mL Intravenous PRN Laurey Morale, MD   4 mL at 05/10/23 1442   Allergies  Allergen Reactions   Fenofibrate Other (See Comments)    GI upset, Joint pain    Social History   Socioeconomic History   Marital status: Single    Spouse name: Not on file   Number of children: Not on file    Years of education: Not on file   Highest education level: Not on file  Occupational History    Employer: [employer]  Tobacco Use   Smoking status: Never   Smokeless tobacco: Never  Vaping Use   Vaping status: Never Used  Substance and Sexual Activity   Alcohol use: Yes    Alcohol/week: 1.0 standard drink of alcohol    Types: 1 Standard drinks or equivalent per week   Drug use: No   Sexual activity: Yes    Birth control/protection: None  Other Topics Concern   Not on file  Social History Narrative   Not on file   Social Determinants of Health   Financial Resource Strain: Not on file  Food Insecurity: Not on file  Transportation Needs: Not on file  Physical Activity: Not on file  Stress: Not on file  Social Connections: Unknown (02/06/2022)   Received from  [facility]   Social Network    Social Network: Not on file  Intimate Partner Violence: Unknown (12/29/2021)   Received from [facility]   HITS    Physically Hurt: Not on file    Insult or Talk Down To: Not on file    Threaten Physical Harm: Not on file    Scream or Curse: Not on file   FH: No family history of premature CAD.   BP (!) 102/97   Pulse 63   Wt 66.6 kg (146 lb 12.8 oz)   SpO2 97%   BMI 22.32 kg/m   Wt Readings from Last 3 Encounters:  05/10/23 66.6 kg (146 lb 12.8 oz)  10/11/22 65.9 kg (145 lb 3.2 oz)  07/11/22 70 kg (154 lb 6.4 oz)   PHYSICAL EXAM: General:  Well appearing. No respiratory difficulty HEENT: normal Neck: supple. no JVD. Carotids 2+ bilat; no bruits. No lymphadenopathy or thyromegaly appreciated. Cor: PMI nondisplaced. Regular rate & rhythm. No rubs, gallops or murmurs. Lungs: clear Abdomen: soft, nontender, nondistended. No hepatosplenomegaly. No bruits or masses. Good bowel sounds. Extremities: no cyanosis, clubbing, rash, edema Neuro: alert & oriented x 3, cranial nerves grossly intact. moves all 4 extremities w/o difficulty. Affect pleasant.   ASSESSMENT &  PLAN: 1. CAD: Late presentation inferior and anterolateral MI 10/21.  Cath showed thrombotic occlusion distal RCA, CTO LAD with collaterals from right, and complex 90% proximal LCx stenosis.  He had PTCA/thrombectomy RCA with good flow at end of procedure.  Suspect he had damage to LAD territory as well as RCA territory as RCA collateralized the LAD.  S/p CABG w/ impella support with LIMA-LAD, left radial to OM, seq SVG-PDA/PLV. Post op course c/b tamponade and was taken back to the OR on 10/14 for drainage, found to have large hematoma obstructing right atrium.  Denies anginal symptomatology  - no ASA given need for coumadin  - Continue atorvastatin 80 mg daily. Lipids controlled (LP 4/24 LDL 40, TG 120) - on ? blocker 2. 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 Echo in 10/21 with EF 25-30%.  Echo in 3/22 showed EF 20-25%, diffuse hypokinesis, mildly decreased RV systolic function.  He now has a Environmental manager ICD.  CPX in 10/22 with mild-moderate HF limitation.  Echo 4/23 showed EF 25-30%, no LV thrombus, normal RV.  Repeat Echo done today, EF remains low at 25%, MV ring stable. No MR or MS. RV normal. IVC not dilated. Reports decreased exercise tolerance, NYHA Class II-early III. Limited more by exertional fatigue than dyspnea. Some days worse than others. Volume stable on exam and on device interrogation. HL score 0. No VT. Low BP and h/o orthostasis limits further GDMT titration today  - Continue Entresto 49/51 bid, had to decrease from highest dose due to lightheadedness.   - Continue torsemide 10 mg every other day  - Continue spironolactone 25 mg qhs. - Continue Coreg 6.25 mg bid. Titration limited by fatigue and low BP  - Continue digoxin 0.0625 mg daily. Check level today.  - Continue Jardiance 10 mg daily.  - Narrow QRS so not CRT candidate.   - Will repeat CXP - We discussed baroreceptor activation therapy or cardiac contractility  monitor but is not interested at this time  3. Severe MR: Suspect infarct-related MR. s/p MV repair at time of CABG 10/21.  Mild MR on 4/23 echo. Mild on today's echo  4. LV thrombus: Not present on 4/23 echo.  Today's echo shows severely reduced LVEF and HK apex. No thrombus seen - Continue warfarin indefinitely for secondary prevention. INR followed by coumadin clinic  5. Post-operative atrial fibrillation: NSR on EKG today. He is off amiodarone. He is on warfarin. INR followed by Coumadin Clinic  6. Hypertriglyceridemia: Unable to tolerate fenofibrate due to GI upset. Now on Vescepa.  Recent LP w/ controlled TG, 120  mg/dL.   F/u in 3 months w/ Dr. Lillia Mountain, PA-C 05/10/23

## 2023-05-11 ENCOUNTER — Ambulatory Visit: Payer: 59 | Attending: Cardiology | Admitting: *Deleted

## 2023-05-11 ENCOUNTER — Telehealth (HOSPITAL_COMMUNITY): Payer: Self-pay

## 2023-05-11 DIAGNOSIS — I824Y2 Acute embolism and thrombosis of unspecified deep veins of left proximal lower extremity: Secondary | ICD-10-CM

## 2023-05-11 DIAGNOSIS — Z5181 Encounter for therapeutic drug level monitoring: Secondary | ICD-10-CM

## 2023-05-11 DIAGNOSIS — I4891 Unspecified atrial fibrillation: Secondary | ICD-10-CM

## 2023-05-11 LAB — POCT INR: INR: 3.1 — AB (ref 2.0–3.0)

## 2023-05-11 NOTE — Patient Instructions (Addendum)
Description   Today take 1/2 tablet of warfarin then START TAKING warfarin 1 tablet daily except 1.5 tablets on  Wednesdays. Recheck INR in 4 weeks. Coumadin Clinic [phone]

## 2023-05-11 NOTE — Telephone Encounter (Signed)
Spoke with patient regarding the following results. Patient made aware and patient verbalized understanding.

## 2023-05-11 NOTE — Telephone Encounter (Signed)
-----   Message from Marca Ancona sent at 05/10/2023  5:02 PM EDT ----- EF remains low at 25%, stably repaired mitral valve.

## 2023-05-17 ENCOUNTER — Other Ambulatory Visit (HOSPITAL_COMMUNITY): Payer: Self-pay | Admitting: Family Medicine

## 2023-05-23 ENCOUNTER — Telehealth (HOSPITAL_COMMUNITY): Payer: Self-pay | Admitting: *Deleted

## 2023-05-23 NOTE — Telephone Encounter (Signed)
Called patient for reminder of CPX appointment on 05/30/23 at 1:30pm . Patient confirmed appointment and all applicable questions were answered. Provided patient with call back [phone] if cancellation/reschedule is necessary. Any clinical concerns beyond CPX procedure were advised to be discussed with ordering provider.    Reggy Eye, MS, ACSM, [facility] Clinical Exercise Physiologist/ Health and Wellness Coach

## 2023-05-24 ENCOUNTER — Encounter (HOSPITAL_COMMUNITY): Payer: Self-pay | Admitting: Cardiology

## 2023-05-30 ENCOUNTER — Ambulatory Visit (HOSPITAL_COMMUNITY): Payer: 59 | Attending: Cardiology

## 2023-05-30 ENCOUNTER — Other Ambulatory Visit: Payer: Self-pay | Admitting: Cardiovascular Disease

## 2023-05-30 ENCOUNTER — Other Ambulatory Visit (HOSPITAL_COMMUNITY): Payer: Self-pay | Admitting: *Deleted

## 2023-05-30 DIAGNOSIS — I5022 Chronic systolic (congestive) heart failure: Secondary | ICD-10-CM

## 2023-05-30 DIAGNOSIS — I4891 Unspecified atrial fibrillation: Secondary | ICD-10-CM

## 2023-05-30 NOTE — Telephone Encounter (Signed)
Refill request for warfarin:  Last INR was 3.1 on 05/11/23 Next INR due 06/08/23 LOV was 10/11/22  Golden Circle MD  Refill approved.

## 2023-06-03 ENCOUNTER — Other Ambulatory Visit (HOSPITAL_COMMUNITY): Payer: Self-pay | Admitting: Cardiology

## 2023-06-08 ENCOUNTER — Ambulatory Visit: Payer: 59 | Attending: Cardiology

## 2023-06-08 DIAGNOSIS — Z5181 Encounter for therapeutic drug level monitoring: Secondary | ICD-10-CM

## 2023-06-08 DIAGNOSIS — I4891 Unspecified atrial fibrillation: Secondary | ICD-10-CM

## 2023-06-08 DIAGNOSIS — I824Y2 Acute embolism and thrombosis of unspecified deep veins of left proximal lower extremity: Secondary | ICD-10-CM

## 2023-06-08 LAB — POCT INR: INR: 3 (ref 2.0–3.0)

## 2023-06-08 NOTE — Patient Instructions (Signed)
Continue 1 tablet daily except 1.5 tablets on  Wednesdays. Recheck INR in 6 weeks. Coumadin Clinic [phone]

## 2023-06-25 ENCOUNTER — Other Ambulatory Visit (HOSPITAL_COMMUNITY): Payer: Self-pay

## 2023-06-25 DIAGNOSIS — I5022 Chronic systolic (congestive) heart failure: Secondary | ICD-10-CM

## 2023-06-25 MED ORDER — DIGOXIN 125 MCG PO TABS: 0.0625 mg | ORAL_TABLET | Freq: Every day | ORAL | 3 refills | Status: DC

## 2023-07-05 ENCOUNTER — Other Ambulatory Visit (HOSPITAL_COMMUNITY): Payer: Self-pay | Admitting: Cardiology

## 2023-07-06 ENCOUNTER — Ambulatory Visit (INDEPENDENT_AMBULATORY_CARE_PROVIDER_SITE_OTHER): Payer: 59

## 2023-07-06 DIAGNOSIS — I255 Ischemic cardiomyopathy: Secondary | ICD-10-CM

## 2023-07-06 LAB — CUP PACEART REMOTE DEVICE CHECK
Battery Remaining Longevity: 168 mo
Battery Remaining Percentage: 100 %
Brady Statistic RV Percent Paced: 0 %
Date Time Interrogation Session: 20241011040100
HighPow Impedance: 72 Ohm
Implantable Lead Connection Status: 753985
Implantable Lead Implant Date: 20220413
Implantable Lead Location: 753860
Implantable Lead Model: 183
Implantable Lead Serial Number: 303520
Implantable Pulse Generator Implant Date: 20220413
Lead Channel Impedance Value: 610 Ohm
Lead Channel Pacing Threshold Amplitude: 0.5 V
Lead Channel Pacing Threshold Pulse Width: 0.4 ms
Lead Channel Setting Pacing Amplitude: 2 V
Lead Channel Setting Pacing Pulse Width: 0.4 ms
Lead Channel Setting Sensing Sensitivity: 0.5 mV
Pulse Gen Serial Number: 212369
Zone Setting Status: 755011

## 2023-07-11 ENCOUNTER — Other Ambulatory Visit (HOSPITAL_COMMUNITY): Payer: Self-pay | Admitting: Cardiology

## 2023-07-16 NOTE — Progress Notes (Signed)
Remote ICD transmission.   

## 2023-07-20 ENCOUNTER — Ambulatory Visit: Payer: 59

## 2023-07-23 ENCOUNTER — Ambulatory Visit: Payer: 59 | Attending: Cardiovascular Disease

## 2023-07-23 DIAGNOSIS — I824Y2 Acute embolism and thrombosis of unspecified deep veins of left proximal lower extremity: Secondary | ICD-10-CM | POA: Diagnosis not present

## 2023-07-23 DIAGNOSIS — Z5181 Encounter for therapeutic drug level monitoring: Secondary | ICD-10-CM | POA: Diagnosis not present

## 2023-07-23 DIAGNOSIS — I4891 Unspecified atrial fibrillation: Secondary | ICD-10-CM | POA: Diagnosis not present

## 2023-07-23 LAB — POCT INR: INR: 2.4 (ref 2.0–3.0)

## 2023-07-23 NOTE — Patient Instructions (Signed)
Continue 1 tablet daily except 1.5 tablets on  Wednesdays. Recheck INR in 6 weeks. Coumadin Clinic [phone]

## 2023-07-25 ENCOUNTER — Other Ambulatory Visit (HOSPITAL_COMMUNITY): Payer: Self-pay | Admitting: Cardiovascular Disease

## 2023-07-26 NOTE — Telephone Encounter (Signed)
This is a CHF pt. Please address 

## 2023-08-08 ENCOUNTER — Encounter (HOSPITAL_COMMUNITY): Payer: Self-pay | Admitting: Cardiology

## 2023-08-08 ENCOUNTER — Ambulatory Visit (HOSPITAL_COMMUNITY)
Admission: RE | Admit: 2023-08-08 | Discharge: 2023-08-08 | Disposition: A | Discharge status: Home / Self Care | Payer: 59 | Source: Ambulatory Visit | Attending: Cardiology | Admitting: Cardiology

## 2023-08-08 VITALS — BP 102/68 | HR 61 | Wt 149.0 lb

## 2023-08-08 DIAGNOSIS — I5022 Chronic systolic (congestive) heart failure: Secondary | ICD-10-CM | POA: Diagnosis present

## 2023-08-08 LAB — DIGOXIN LEVEL: Digoxin Level: 0.3 ng/mL — ABNORMAL LOW (ref 0.8–2.0)

## 2023-08-08 LAB — BRAIN NATRIURETIC PEPTIDE: B Natriuretic Peptide: 238.6 pg/mL — ABNORMAL HIGH (ref 0.0–100.0)

## 2023-08-08 LAB — BASIC METABOLIC PANEL
Anion gap: 6 (ref 5–15)
BUN: 19 mg/dL (ref 6–20)
CO2: 27 mmol/L (ref 22–32)
Calcium: 9.5 mg/dL (ref 8.9–10.3)
Chloride: 103 mmol/L (ref 98–111)
Creatinine, Ser: 1.41 mg/dL — ABNORMAL HIGH (ref 0.61–1.24)
GFR, Estimated: 60 mL/min — ABNORMAL LOW (ref >60)
Glucose, Bld: 94 mg/dL (ref 70–99)
Potassium: 4.8 mmol/L (ref 3.5–5.1)
Sodium: 136 mmol/L (ref 135–145)

## 2023-08-08 LAB — LIPID PANEL
Cholesterol: 96 mg/dL (ref 0–200)
HDL: 32 mg/dL — ABNORMAL LOW (ref >40)
LDL Cholesterol: 47 mg/dL (ref 0–99)
Total CHOL/HDL Ratio: 3 {ratio}
Triglycerides: 86 mg/dL (ref <150)
VLDL: 17 mg/dL (ref 0–40)

## 2023-08-08 MED ORDER — TORSEMIDE 20 MG PO TABS: 10.0000 mg | ORAL_TABLET | Freq: Every day | ORAL | 3 refills | Status: DC

## 2023-08-08 NOTE — Patient Instructions (Signed)
INCREASE Torsemide to 10 mg daily.  Labs done today, your results will be available in MyChart, we will contact you for abnormal readings.  Repeat blood work in 10 days.  You have been referred to Dr. Myra Gianotti. His office will call you to arrange your appointment.   Your physician recommends that you schedule a follow-up appointment in: 3 months.  If you have any questions or concerns before your next appointment please send [place] a message through [place] or call our office at [phone].    TO LEAVE A MESSAGE FOR THE NURSE SELECT OPTION 2, PLEASE LEAVE A MESSAGE INCLUDING: YOUR NAME DATE OF BIRTH CALL BACK NUMBER REASON FOR CALL**this is important as we prioritize the call backs  YOU WILL RECEIVE A CALL BACK THE SAME DAY AS LONG AS YOU CALL BEFORE 4:00 PM  At the [facility], you and your health needs are our priority. As part of our continuing mission to provide you with exceptional heart care, we have created designated Provider Care Teams. These Care Teams include your primary Cardiologist (physician) and Advanced Practice Providers (APPs- Physician Assistants and Nurse Practitioners) who all work together to provide you with the care you need, when you need it.   You may see any of the following providers on your designated Care Team at your next follow up: Dr Arvilla Meres Dr Marca Ancona Dr. Dorthula Nettles Dr. Clearnce Hasten Amy Filbert Schilder, NP Robbie Lis, [place] [facility] [place], [place] Brynda Peon, NP [place] Lee, NP Karle Plumber, PharmD   Please be sure to bring in all your medications bottles to every appointment.    Thank you for choosing [facility]

## 2023-08-09 NOTE — Progress Notes (Signed)
Advanced Heart Failure Clinic Note  PCP: Sigmund Hazel, MD HF Cardiology: Dr. Shirlee Latch  CT Surgery: Dr. Dorris Fetch   52 y.o. male s/p back surgery 05/2020 was admitted for acute inferior and anterolateral MI on 06/25/20. LHC showed thrombotic occlusion of distal RCA that was treated with PTCA and thrombectomy.  He was also noted to have chronic total occlusion of the LAD with collaterals from the right (thus this territory was affected by the RCA MI) as well as 90% complex proximal LCx stenosis. EF 25-30% + apical thrombus and severe mitral regurgitation. RV normal. Also w/ post MI pericarditis, treated w/ colchicine. Course further c/b cardiogenic shock and acute hypoxic respiratory failure w/ component of septic shock 2/2 PNA, requiring intubation and treatment w/ abx. Once stabilized, he underwent CABG x 3 (LIMA-LAD, Lt radial-OM1 and sequential SVG-PDA/PLA) + MV repair and placement of Impella 5.5 on 10/13 by Dr. Dorris Fetch.   Post operatively, he developed tamponade and was taken back to the OR on 10/14 with large hematoma obstructing right atrium. He improved and was extubated 10/19.  Impella removal 10/21 w/ stable hemodynamics and able to tolerate initiation of GDMT.  Also had post-operative afib treated w/ amiodarone. He was discharged home on 07/21/20.   Echo in 3/22 showed EF 20-25% with diffuse hypokinesis, mildly decreased RV systolic function, no LV thrombus, normal IVC, s/p MV repair with trivial MR and mild stenosis (mean gradient 4 mmHg).  [employer] ICD placed in 4/22.  Later in 4/22, he had L4-L5 spinal fusion.   CPX in 10/22 showed mild-moderate HF limitation. Echo 4/23 EF 25-30%, no LV thrombus, normal RV.  Echo in 8/24 showed EF 25% with mild mitral regurgitation s/p MV repair, normal RV. CPX in 9/24 showed a mild to moderate HF limitation.   Today he returns for HF follow up with his girlfriend. He is in NSR today. He does not get short of breath with usual activities but  fatigues easily during the day.  He is more tired than in the past, has to work really hard to push through his work day.  He feels like he is retaining fluid and weight is up 3 lbs.  Able to climb a flight of stairs without dyspnea.  No chest pain.    Labs (11/21): LDL 59, HDL 34 Labs (1/22): digoxin level 1.2 Labs (2/22): K 4.3, creatinine 1.1 Labs (4/22): K 5, creatinine 1.02, hgb 11.8 Labs (5/22): K 4.1, creatinine 0.88, hgb 15.5, digoxin 0.4 Labs (9/22): LDL 34, TGs 315 Labs (11/22): K 3.6, creatinine 1.22 => 1.77 Labs (3/23): TGs 350, LDL 8 Labs (4/23): K 4.6, creatinine 1.25, LDL 44, HDL 23 Labs (9/23): K 4, creatinine 1.82 => 1.62 Labs (10/23): creatinine 1.25 => 1.56 Labs (1/24): creatinine 1.52, K 4.6 Labs (4/24): TG 120, LDL 40  Labs (8/24): digoxin 0.5, K 4.6, creatinine 1.22  ECG (personally reviewed): NSR, inferior Qs, anterior Qs  Review of systems complete and found to be negative unless listed in HPI.    PMH: 1. Anxiety 2. Hyperlipidemia 3. CAD: Inferior and anterolateral MI 10/21.  LHC showed thrombotic occlusion distal RCA treated with PTCA/thrombectomy, CTO LAD with R=>L collaterals, 90% complex proximal LCx stenosis.  - CABG x 4 in 10/21 with LIMA-LAD, left radial-OM1, sequential SVG-PDA/PLV.  4. Mitral regurgitation: Suspect infarct-related MR.   - Mitral valve repair in 10/21 with CABG.  5. LV thrombus  6. Chronic systolic CHF: ischemic cardiomyopathy.  [employer] ICD.  Echo (10/21) with EF 25-30%,  apical thrombus, severe MR, normal RV.   - Echo (3/22): EF 20-25% with diffuse hypokinesis, mildly decreased RV systolic function, no LV thrombus, normal IVC, s/p MV repair with trivial MR and mild stenosis (mean gradient 4 mmHg).   - CPX (10/22): RER 1.17, VE/VCO2 slope 31, peak VO2 22.7 => mild-moderate HF limitation.  - Echo (4/23): EF 25-30%, no LV thrombus, normal RV. - Echo (8/24): EF 25% with mild mitral regurgitation s/p MV repair, normal RV.  - CPX  (9/24): peak VO2 24, VE/VCO2 slope 33, RER 1.09 7. Post-infarct pericarditis in 10/21. 8. Atrial fibrillation: Post-op in 10/21.  9. H/o back surgery 9/21: Discectomy.   10. CKD stage 3  Current Outpatient Medications  Medication Sig Dispense Refill   acetaminophen (TYLENOL) 325 MG tablet Take 2 tablets (650 mg total) by mouth every 6 (six) hours as needed for mild pain.     ALPRAZolam (XANAX) 1 MG tablet Take 1 mg by mouth at bedtime as needed for anxiety.     atorvastatin (LIPITOR) 80 MG tablet TAKE 1 TABLET(80 MG) BY MOUTH DAILY 90 tablet 1   carvedilol (COREG) 6.25 MG tablet TAKE 1 TABLET(6.25 MG) BY MOUTH TWICE DAILY WITH A MEAL 60 tablet 4   digoxin (LANOXIN) 0.125 MG tablet Take 0.5 tablets (0.0625 mg total) by mouth daily. 45 tablet 3   ENTRESTO 49-51 MG TAKE 1 TABLET BY MOUTH TWICE DAILY 60 tablet 11   icosapent Ethyl (VASCEPA) 1 g capsule Take 2 capsules (2 g total) by mouth 2 (two) times daily. 120 capsule 11   JARDIANCE 10 MG TABS tablet TAKE 1 TABLET(10 MG) BY MOUTH DAILY BEFORE BREAKFAST 30 tablet 11   linaclotide (LINZESS) 145 MCG CAPS capsule Take 145 mcg by mouth daily before breakfast.     Multiple Vitamin (MULTIVITAMIN WITH MINERALS) TABS tablet Take 1 tablet by mouth in the morning.     spironolactone (ALDACTONE) 25 MG tablet TAKE 1 TABLET(25 MG) BY MOUTH AT BEDTIME 90 tablet 3   traZODone (DESYREL) 100 MG tablet Take 100 mg by mouth at bedtime.     warfarin (COUMADIN) 2.5 MG tablet TAKE 1 TO 1 AND 1/2 TABLETS BY MOUTH DAILY AS DIRECTED BY COUMADIN CLINIC 100 tablet 1   torsemide (DEMADEX) 20 MG tablet Take 0.5 tablets (10 mg total) by mouth daily. 30 tablet 3   No current facility-administered medications for this encounter.   Allergies  Allergen Reactions   Fenofibrate Other (See Comments)    GI upset, Joint pain    Social History   Socioeconomic History   Marital status: Single    Spouse name: Not on file   Number of children: Not on file   Years of  education: Not on file   Highest education level: Not on file  Occupational History    Employer: [employer]  Tobacco Use   Smoking status: Never   Smokeless tobacco: Never  Vaping Use   Vaping status: Never Used  Substance and Sexual Activity   Alcohol use: Yes    Alcohol/week: 1.0 standard drink of alcohol    Types: 1 Standard drinks or equivalent per week   Drug use: No   Sexual activity: Yes    Birth control/protection: None  Other Topics Concern   Not on file  Social History Narrative   Not on file   Social Determinants of Health   Financial Resource Strain: Not on file  Food Insecurity: Not on file  Transportation Needs: Not on file  Physical Activity: Not on file  Stress: Not on file  Social Connections: Unknown (02/06/2022)   Received from [facility], [facility]   Social Network    Social Network: Not on file  Intimate Partner Violence: Unknown (12/29/2021)   Received from [facility], [facility]   HITS    Physically Hurt: Not on file    Insult or Talk Down To: Not on file    Threaten Physical Harm: Not on file    Scream or Curse: Not on file   FH: No family history of premature CAD.   BP 102/68   Pulse 61   Wt 67.6 kg (149 lb)   SpO2 97%   BMI 22.66 kg/m   Wt Readings from Last 3 Encounters:  08/08/23 67.6 kg (149 lb)  05/10/23 66.6 kg (146 lb 12.8 oz)  10/11/22 65.9 kg (145 lb 3.2 oz)   PHYSICAL EXAM: General: NAD Neck: JVP 8 cm, no thyromegaly or thyroid nodule.  Lungs: Clear to auscultation bilaterally with normal respiratory effort. CV: Nondisplaced PMI.  Heart regular S1/S2, no S3/S4, no murmur.  No peripheral edema.  No carotid bruit.  Normal pedal pulses.  Abdomen: Soft, nontender, no hepatosplenomegaly, no distention.  Skin: Intact without lesions or rashes.  Neurologic: Alert and oriented x 3.  Psych: Normal affect. Extremities: No clubbing or cyanosis.  HEENT: Normal.   ASSESSMENT & PLAN: 1. CAD: Late presentation  inferior and anterolateral MI 10/21.  Cath showed thrombotic occlusion distal RCA, CTO LAD with collaterals from right, and complex 90% proximal LCx stenosis.  He had PTCA/thrombectomy RCA with good flow at end of procedure.  Suspect he had damage to LAD territory as well as RCA territory as RCA collateralized the LAD.  S/p CABG w/ impella support with LIMA-LAD, left radial to OM, seq SVG-PDA/PLV. Post op course c/b tamponade and was taken back to the OR on 10/14 for drainage, found to have large hematoma obstructing right atrium.  No chest pain currently.  - no ASA given need for coumadin  - Continue atorvastatin 80 mg daily. Check lipids today.  2. 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 Echo in 10/21 with EF 25-30%.  Echo in 3/22 showed EF 20-25%, diffuse hypokinesis, mildly decreased RV systolic function.  He now has a Environmental manager ICD.  CPX in 10/22 with mild-moderate HF limitation.  Echo 4/23 showed EF 25-30%, no LV thrombus, normal RV.  Echo in 8/24 showed that EF remains low at 25%, s/p MV repair with mild MR, RV normal. CPX in 9/24 showed mild to moderate HF limitation.  NYHA class III symptoms, primarily due to fatigue.  He is mildly volume overloaded on exam with weight up 3 lbs. - Continue Entresto 49/51 bid, had to decrease from highest dose due to lightheadedness.   - Increase torsemide to 10 mg daily.  BMET/BNP today and BMET in 10 days. - Continue spironolactone 25 mg qhs. - Continue Coreg 6.25 mg bid. Titration limited by fatigue and low BP  - Continue digoxin 0.0625 mg daily. Check level today.  - Continue Jardiance 10 mg daily.  - Narrow QRS so not CRT candidate.   - He is interested in baroreceptor activation therapy.  I will refer him to Dr. Myra Gianotti for consideration.  He will need to hold warfarin for procedure.  3. Severe MR: Suspect infarct-related MR. s/p MV repair at time of CABG 10/21.  Mild MR on 4/23 echo. Mild on 8/24  echo.  4. LV thrombus:  - Continue warfarin indefinitely given ongoing low EF.  5. Post-operative atrial fibrillation: NSR on EKG today. He is off amiodarone. He is on warfarin. INR followed by [facility]madin Clinic  6. Hypertriglyceridemia: Unable to tolerate fenofibrate due to GI upset. Now on Vescepa.   - Check lipids today.   F/u in 3 months w/ APP   Marca Ancona, MD 08/09/23

## 2023-08-20 ENCOUNTER — Other Ambulatory Visit (HOSPITAL_COMMUNITY): Payer: 59

## 2023-08-21 ENCOUNTER — Ambulatory Visit (HOSPITAL_COMMUNITY)
Admission: RE | Admit: 2023-08-21 | Discharge: 2023-08-21 | Disposition: A | Discharge status: Home / Self Care | Payer: 59 | Source: Ambulatory Visit | Attending: Internal Medicine | Admitting: Internal Medicine

## 2023-08-21 DIAGNOSIS — I5022 Chronic systolic (congestive) heart failure: Secondary | ICD-10-CM | POA: Diagnosis present

## 2023-08-21 LAB — BASIC METABOLIC PANEL
Anion gap: 11 (ref 5–15)
BUN: 37 mg/dL — ABNORMAL HIGH (ref 6–20)
CO2: 27 mmol/L (ref 22–32)
Calcium: 9.4 mg/dL (ref 8.9–10.3)
Chloride: 100 mmol/L (ref 98–111)
Creatinine, Ser: 1.74 mg/dL — ABNORMAL HIGH (ref 0.61–1.24)
GFR, Estimated: 47 mL/min — ABNORMAL LOW (ref >60)
Glucose, Bld: 93 mg/dL (ref 70–99)
Potassium: 4.2 mmol/L (ref 3.5–5.1)
Sodium: 138 mmol/L (ref 135–145)

## 2023-08-22 ENCOUNTER — Telehealth (HOSPITAL_COMMUNITY): Payer: Self-pay

## 2023-08-22 DIAGNOSIS — I5022 Chronic systolic (congestive) heart failure: Secondary | ICD-10-CM

## 2023-08-22 NOTE — Telephone Encounter (Signed)
Spoke with patient regarding the following results. Patient made aware and patient verbalized understanding.   Patient states that his fluid is well managed with the 10mg  of torsemide daily and states that he wants to continue this. Patient reports that he is aware of the kidney function and effect of torsemide but states that this is the only thing that has managed his fluid- states he is waiting to get his barostim.   Advised patient I would forward to Dr. Shirlee Latch to make aware. Repeat BMET ordered and scheduled.

## 2023-08-22 NOTE — Telephone Encounter (Signed)
-----   Message from Marca Ancona sent at 08/21/2023  4:49 PM EST ----- Creatinine up to 1.74.  Let's have him decrease torsemide back to 10 mg every other day and cut down on sodium in diet.  BMET again in 2 wks.

## 2023-08-30 ENCOUNTER — Encounter: Payer: Self-pay | Admitting: Internal Medicine

## 2023-08-30 ENCOUNTER — Ambulatory Visit: Payer: 59 | Attending: Internal Medicine | Admitting: Internal Medicine

## 2023-08-30 VITALS — BP 92/60 | HR 70 | Ht 68.0 in | Wt 149.4 lb

## 2023-08-30 DIAGNOSIS — I5022 Chronic systolic (congestive) heart failure: Secondary | ICD-10-CM | POA: Diagnosis not present

## 2023-08-30 NOTE — Patient Instructions (Signed)
Medication Instructions:  Your physician recommends that you continue on your current medications as directed. Please refer to the Current Medication list given to you today. *If you need a refill on your cardiac medications before your next appointment, please call your pharmacy*   Follow-Up: At [facility], you and your health needs are our priority.  As part of our continuing mission to provide you with exceptional heart care, we have created designated Provider Care Teams.  These Care Teams include your primary Cardiologist (physician) and Advanced Practice Providers (APPs -  Physician Assistants and Nurse Practitioners) who all work together to provide you with the care you need, when you need it.  We recommend signing up for the patient portal called "MyChart".  Sign up information is provided on this After Visit Summary.  MyChart is used to connect with patients for Virtual Visits (Telemedicine).  Patients are able to view lab/test results, encounter notes, upcoming appointments, etc.  Non-urgent messages can be sent to your provider as well.   To learn more about what you can do with MyChart, go to [email or web address].    Your next appointment:   1 year(s)  Provider:   Nobie Putnam, MD

## 2023-08-30 NOTE — Progress Notes (Signed)
Patient Care Team: Sigmund Hazel, MD as PCP - General (Family Medicine) Tonny Bollman, MD as PCP - Cardiology (Cardiology)   HPI  Jerry [name] is a 52 y.o. male seen in followup for ICD implanted 4/22 for primary prevention for ICM, prior impella supported  CABG with concomitant Mitral valve disease with ring; apical thrombus The patient denies chest pain, nocturnal dyspnea, orthopnea or peripheral edema.  There have been no palpitations, lightheadedness or syncope.  Complains of shortness of breath, cough.   Discussions in process regarding adjunctive pacing technologies for CHF, ie Barostim v CCM    DATE TEST EF    10/21 LHC 25-30 % LAD-T (R>>L); CXp-90% RCAd-T>>POBA  10/21 Echo   * % MR mod  3/22 Echo  20-25%    4/23 Echo 25-30%   8/24 Echo  25%     Date Cr K Dig Hgb  3/22 1.12 4.8 0.5 16.3  1/23    14.0   9/23 1.68 4.0 0.5    11/24 1.74 4.2 0.3       Records and Results Reviewed  Past Medical History:  Diagnosis Date   AICD (automatic cardioverter/defibrillator) present 01/05/2021   [employer]   Anxiety    Coronary artery disease    Essential (primary) hypertension 06/17/2020   Myocardial infarction (HCC)    Radiculopathy, lumbar region 11/14/2019    Past Surgical History:  Procedure Laterality Date   BACK SURGERY     CARDIAC CATHETERIZATION     CORONARY ANGIOPLASTY     CORONARY ARTERY BYPASS GRAFT N/A 07/07/2020   Procedure: CORONARY ARTERY BYPASS GRAFTING (CABG) x FOUR, USING LEFT RADIAL ARTERY AND RIGHT LEG GREATER SAPHENOUS VEIN HARVESTED ENDOSCOPICALLY;  Surgeon: Loreli Slot, MD;  Location: [facility] OR;  Service: Open Heart Surgery;  Laterality: N/A;   CORONARY BALLOON ANGIOPLASTY N/A 06/25/2020   Procedure: CORONARY BALLOON ANGIOPLASTY;  Surgeon: Tonny Bollman, MD;  Location: [facility] INVASIVE CV LAB;  Service: Cardiovascular;  Laterality: N/A;   CORONARY THROMBECTOMY N/A 06/25/2020   Procedure: Coronary Thrombectomy;  Surgeon: Tonny Bollman, MD;  Location: [facility] INVASIVE CV LAB;  Service: Cardiovascular;  Laterality: N/A;   CORONARY/GRAFT ACUTE MI REVASCULARIZATION N/A 06/26/2020   Procedure: Coronary/Graft Acute MI Revascularization;  Surgeon: Tonny Bollman, MD;  Location: [facility] INVASIVE CV LAB;  Service: Cardiovascular;  Laterality: N/A;   ENDOVEIN HARVEST OF GREATER SAPHENOUS VEIN Right 07/07/2020   Procedure: ENDOVEIN HARVEST OF GREATER SAPHENOUS VEIN;  Surgeon: Loreli Slot, MD;  Location: [facility] OR;  Service: Open Heart Surgery;  Laterality: Right;   EXPLORATION POST OPERATIVE OPEN HEART N/A 07/08/2020   Procedure: EXPLORATION POST OPERATIVE OPEN HEART TAMPONADE;  Surgeon: Loreli Slot, MD;  Location: [facility] OR;  Service: Open Heart Surgery;  Laterality: N/A;   FRACTURE SURGERY     ICD IMPLANT N/A 01/05/2021   Procedure: ICD IMPLANT;  Surgeon: Duke Salvia, MD;  Location: [facility] INVASIVE CV LAB;  Service: Cardiovascular;  Laterality: N/A;   LEFT HEART CATH AND CORONARY ANGIOGRAPHY N/A 06/25/2020   Procedure: LEFT HEART CATH AND CORONARY ANGIOGRAPHY;  Surgeon: Tonny Bollman, MD;  Location: [facility] INVASIVE CV LAB;  Service: Cardiovascular;  Laterality: N/A;   PLACEMENT OF IMPELLA LEFT VENTRICULAR ASSIST DEVICE Right 07/07/2020   Procedure: PLACEMENT OF IMPELLA LEFT VENTRICULAR ASSIST DEVICE USING ABIOMED IMPELLA 5.5;  Surgeon: Loreli Slot, MD;  Location: [facility] OR;  Service: Open Heart Surgery;  Laterality: Right;   PLACEMENT OF IMPELLA LEFT VENTRICULAR ASSIST DEVICE N/A  07/15/2020   Procedure: REMOVAL OF IMPELLA LEFT VENTRICULAR ASSIST DEVICE;  Surgeon: Loreli Slot, MD;  Location: [facility] OR;  Service: Open Heart Surgery;  Laterality: N/A;   RADIAL ARTERY HARVEST Left 07/07/2020   Procedure: RADIAL ARTERY HARVEST left;  Surgeon: Loreli Slot, MD;  Location: [facility] OR;  Service: Open Heart Surgery;  Laterality: Left;   right clavicle fracture reapir  2010   RIGHT HEART CATH N/A 06/26/2020   Procedure:  RIGHT HEART CATH;  Surgeon: Tonny Bollman, MD;  Location: [facility] INVASIVE CV LAB;  Service: Cardiovascular;  Laterality: N/A;   TEE WITHOUT CARDIOVERSION N/A 07/07/2020   Procedure: TRANSESOPHAGEAL ECHOCARDIOGRAM (TEE);  Surgeon: Loreli Slot, MD;  Location: [facility] OR;  Service: Open Heart Surgery;  Laterality: N/A;   TRANSFORAMINAL LUMBAR INTERBODY FUSION W/ MIS 1 LEVEL Right 01/06/2021   Procedure: Right Lumbar 4-5 Minimally invasive transforaminal lumbar interbody fusion;  Surgeon: Jadene Pierini, MD;  Location: [facility] OR;  Service: Neurosurgery;  Laterality: Right;  3C/RM 19    Current Meds  Medication Sig   acetaminophen (TYLENOL) 325 MG tablet Take 2 tablets (650 mg total) by mouth every 6 (six) hours as needed for mild pain.   ALPRAZolam (XANAX) 1 MG tablet Take 1 mg by mouth at bedtime as needed for anxiety.   atorvastatin (LIPITOR) 80 MG tablet TAKE 1 TABLET(80 MG) BY MOUTH DAILY   carvedilol (COREG) 6.25 MG tablet TAKE 1 TABLET(6.25 MG) BY MOUTH TWICE DAILY WITH A MEAL   digoxin (LANOXIN) 0.125 MG tablet Take 0.5 tablets (0.0625 mg total) by mouth daily.   ENTRESTO 49-51 MG TAKE 1 TABLET BY MOUTH TWICE DAILY   icosapent Ethyl (VASCEPA) 1 g capsule Take 2 capsules (2 g total) by mouth 2 (two) times daily.   JARDIANCE 10 MG TABS tablet TAKE 1 TABLET(10 MG) BY MOUTH DAILY BEFORE BREAKFAST   linaclotide (LINZESS) 145 MCG CAPS capsule Take 145 mcg by mouth daily before breakfast.   Multiple Vitamin (MULTIVITAMIN WITH MINERALS) TABS tablet Take 1 tablet by mouth in the morning.   spironolactone (ALDACTONE) 25 MG tablet TAKE 1 TABLET(25 MG) BY MOUTH AT BEDTIME   torsemide (DEMADEX) 20 MG tablet Take 0.5 tablets (10 mg total) by mouth daily.   traZODone (DESYREL) 100 MG tablet Take 100 mg by mouth at bedtime.   warfarin (COUMADIN) 2.5 MG tablet TAKE 1 TO 1 AND 1/2 TABLETS BY MOUTH DAILY AS DIRECTED BY COUMADIN CLINIC    Allergies  Allergen Reactions   Fenofibrate Other (See Comments)     GI upset, Joint pain       Review of Systems negative except from HPI and PMH  Physical Exam BP 92/60   Pulse 70   Ht 5\' 8"  (1.727 m)   Wt 149 lb 6.4 oz (67.8 kg)   SpO2 97%   BMI 22.72 kg/m  Well developed and well nourished in no acute distress HENT normal Neck supple with JVP-flat Clear Device pocket well healed; without hematoma or erythema.  There is no tethering  Regular rate and rhythm, no  murmur Abd-soft with active BS No Clubbing cyanosis  edema Skin-warm and dry A & Oriented  Grossly normal sensory and motor function  ECG sinus @ 64 19/11/39  Device function is normal. Programming changes none   See Paceart for details   this case again  Estimated Creatinine Clearance: 47.6 mL/min (A) (by C-G formula based on SCr of 1.74 mg/dL (H)).   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  Struggling with some heart failure symptoms.  Continue him on Lanoxin carvedilol Aldactone and Entresto per Dr. Lucien Mons.  Low blood pressure but not significant dizziness.  Discussed with Dr. Shirlee Latch the alternative a CCM to Baro-Stim.  He suggest we see if he can get approval for the former.  If not he will follow-up with Dr. Myra Gianotti

## 2023-09-03 ENCOUNTER — Ambulatory Visit: Payer: 59 | Attending: Internal Medicine | Admitting: *Deleted

## 2023-09-03 DIAGNOSIS — Z5181 Encounter for therapeutic drug level monitoring: Secondary | ICD-10-CM | POA: Diagnosis not present

## 2023-09-03 DIAGNOSIS — I4891 Unspecified atrial fibrillation: Secondary | ICD-10-CM

## 2023-09-03 DIAGNOSIS — I824Y2 Acute embolism and thrombosis of unspecified deep veins of left proximal lower extremity: Secondary | ICD-10-CM | POA: Diagnosis not present

## 2023-09-03 LAB — POCT INR: INR: 3.9 — AB (ref 2.0–3.0)

## 2023-09-03 NOTE — Patient Instructions (Signed)
Description   Do not take any warfarin today then continue 1 tablet daily except 1.5 tablets on  Wednesdays. Recheck INR in 4 weeks. Coumadin Clinic [phone]

## 2023-09-05 ENCOUNTER — Ambulatory Visit (HOSPITAL_COMMUNITY)
Admission: RE | Admit: 2023-09-05 | Discharge: 2023-09-05 | Disposition: A | Discharge status: Home / Self Care | Payer: 59 | Source: Ambulatory Visit | Attending: Cardiology | Admitting: Cardiology

## 2023-09-05 DIAGNOSIS — I5022 Chronic systolic (congestive) heart failure: Secondary | ICD-10-CM | POA: Diagnosis present

## 2023-09-05 LAB — BASIC METABOLIC PANEL
Anion gap: 9 (ref 5–15)
BUN: 37 mg/dL — ABNORMAL HIGH (ref 6–20)
CO2: 26 mmol/L (ref 22–32)
Calcium: 9.3 mg/dL (ref 8.9–10.3)
Chloride: 102 mmol/L (ref 98–111)
Creatinine, Ser: 1.81 mg/dL — ABNORMAL HIGH (ref 0.61–1.24)
GFR, Estimated: 44 mL/min — ABNORMAL LOW (ref >60)
Glucose, Bld: 89 mg/dL (ref 70–99)
Potassium: 4.8 mmol/L (ref 3.5–5.1)
Sodium: 137 mmol/L (ref 135–145)

## 2023-09-06 ENCOUNTER — Encounter: Payer: Self-pay | Admitting: Internal Medicine

## 2023-09-07 ENCOUNTER — Encounter (HOSPITAL_COMMUNITY): Payer: Self-pay

## 2023-09-08 LAB — CUP PACEART INCLINIC DEVICE CHECK
Date Time Interrogation Session: 20241205154648
Implantable Lead Connection Status: 753985
Implantable Lead Implant Date: 20220413
Implantable Lead Location: 753860
Implantable Lead Model: 183
Implantable Lead Serial Number: 303520
Implantable Pulse Generator Implant Date: 20220413
Pulse Gen Serial Number: 212369

## 2023-09-27 ENCOUNTER — Encounter (HOSPITAL_COMMUNITY): Payer: Self-pay

## 2023-09-27 ENCOUNTER — Other Ambulatory Visit (HOSPITAL_COMMUNITY): Payer: 59

## 2023-09-28 ENCOUNTER — Ambulatory Visit (HOSPITAL_COMMUNITY)
Admission: RE | Admit: 2023-09-28 | Discharge: 2023-09-28 | Disposition: A | Discharge status: Home / Self Care | Payer: 59 | Source: Ambulatory Visit | Attending: Cardiology | Admitting: Cardiology

## 2023-09-28 ENCOUNTER — Other Ambulatory Visit: Payer: Self-pay | Admitting: *Deleted

## 2023-09-28 DIAGNOSIS — I5022 Chronic systolic (congestive) heart failure: Secondary | ICD-10-CM | POA: Insufficient documentation

## 2023-09-28 DIAGNOSIS — R0989 Other specified symptoms and signs involving the circulatory and respiratory systems: Secondary | ICD-10-CM

## 2023-09-28 LAB — BASIC METABOLIC PANEL
Anion gap: 7 (ref 5–15)
BUN: 27 mg/dL — ABNORMAL HIGH (ref 6–20)
CO2: 26 mmol/L (ref 22–32)
Calcium: 9.2 mg/dL (ref 8.9–10.3)
Chloride: 104 mmol/L (ref 98–111)
Creatinine, Ser: 1.29 mg/dL — ABNORMAL HIGH (ref 0.61–1.24)
GFR, Estimated: >60 mL/min (ref >60)
Glucose, Bld: 94 mg/dL (ref 70–99)
Potassium: 4.7 mmol/L (ref 3.5–5.1)
Sodium: 137 mmol/L (ref 135–145)

## 2023-10-01 ENCOUNTER — Ambulatory Visit: Payer: 59 | Attending: Cardiology

## 2023-10-01 DIAGNOSIS — I824Y2 Acute embolism and thrombosis of unspecified deep veins of left proximal lower extremity: Secondary | ICD-10-CM

## 2023-10-01 DIAGNOSIS — I4891 Unspecified atrial fibrillation: Secondary | ICD-10-CM

## 2023-10-01 DIAGNOSIS — Z5181 Encounter for therapeutic drug level monitoring: Secondary | ICD-10-CM | POA: Diagnosis not present

## 2023-10-01 LAB — POCT INR: INR: 2.3 (ref 2.0–3.0)

## 2023-10-01 NOTE — Patient Instructions (Signed)
 continue 1 tablet daily except 1.5 tablets on  Wednesdays. Recheck INR in 4 weeks. Coumadin Clinic [phone]

## 2023-10-04 LAB — CUP PACEART REMOTE DEVICE CHECK
Battery Remaining Longevity: 168 mo
Battery Remaining Percentage: 100 %
Brady Statistic RV Percent Paced: 0 %
Date Time Interrogation Session: 20250109040200
HighPow Impedance: 88 Ohm
Implantable Lead Connection Status: 753985
Implantable Lead Implant Date: 20220413
Implantable Lead Location: 753860
Implantable Lead Model: 183
Implantable Lead Serial Number: 303520
Implantable Pulse Generator Implant Date: 20220413
Lead Channel Impedance Value: 626 Ohm
Lead Channel Pacing Threshold Amplitude: 0.7 V
Lead Channel Pacing Threshold Pulse Width: 0.4 ms
Lead Channel Setting Pacing Amplitude: 2 V
Lead Channel Setting Pacing Pulse Width: 0.4 ms
Lead Channel Setting Sensing Sensitivity: 0.5 mV
Pulse Gen Serial Number: 212369
Zone Setting Status: 755011

## 2023-10-05 ENCOUNTER — Ambulatory Visit: Payer: 59

## 2023-10-05 ENCOUNTER — Ambulatory Visit (HOSPITAL_COMMUNITY): Payer: 59

## 2023-10-05 DIAGNOSIS — I255 Ischemic cardiomyopathy: Secondary | ICD-10-CM | POA: Diagnosis not present

## 2023-10-08 ENCOUNTER — Telehealth: Payer: Self-pay | Admitting: Internal Medicine

## 2023-10-08 ENCOUNTER — Encounter: Payer: Self-pay | Admitting: Surgery

## 2023-10-08 ENCOUNTER — Ambulatory Visit: Payer: 59 | Admitting: Surgery

## 2023-10-08 ENCOUNTER — Ambulatory Visit (HOSPITAL_COMMUNITY)
Admission: RE | Admit: 2023-10-08 | Discharge: 2023-10-08 | Disposition: A | Discharge status: Home / Self Care | Payer: 59 | Source: Ambulatory Visit | Attending: Surgery | Admitting: Surgery

## 2023-10-08 ENCOUNTER — Encounter (HOSPITAL_COMMUNITY): Payer: 59

## 2023-10-08 VITALS — BP 89/54 | HR 63 | Temp 98.6°F | Resp 20 | Ht 68.0 in | Wt 154.1 lb

## 2023-10-08 DIAGNOSIS — I5042 Chronic combined systolic (congestive) and diastolic (congestive) heart failure: Secondary | ICD-10-CM

## 2023-10-08 DIAGNOSIS — R0989 Other specified symptoms and signs involving the circulatory and respiratory systems: Secondary | ICD-10-CM | POA: Insufficient documentation

## 2023-10-08 NOTE — Telephone Encounter (Signed)
 Patient's fiance is calling because the patient was denied by the insurance company for the patient to have a CCM. Patient's fiance stated the insurance company is asking for Dr. Fernande to do a peer to peer and explain why this would be medically neccessary for the patient. Patient's fiance is requesting a call back in regards to this. Please advise.

## 2023-10-08 NOTE — Progress Notes (Signed)
 Vascular and Vein Specialist of [facility]  Patient name: Jerry [name] MRN: [id number] DOB: 1970-11-15 Sex: male   REQUESTING PROVIDER:    Dr. Rolan   REASON FOR CONSULT:    Barostim eval  HISTORY OF PRESENT ILLNESS:   Jerry [name] is a 53 y.o. male, who is referred for evaluation of a Barostim implant.  The patient suffers from NYHA class III heart failure with persistent symptoms despite goal-directed medical therapy.  His primary symptom is fatigue and shortness of breath.  The patient was originally admitted in 2021 for an acute inferior and anterior lateral MI.  He was treated with PCI.  His ejection fraction was 25-30% with apical thrombus and mitral regurgitation.  He also had post MI pericarditis, treated medically.  He ultimately underwent CABG and MVR repair.  He developed tamponade after surgery and went back to the operating room for hematoma evacuation.  The patient's most recent echo shows an ejection fraction of 25%.  He has stage III chronic renal insufficiency.  He is anticoagulated with Coumadin .  He has an AICD in place.  He is a non-smoker.  PAST MEDICAL HISTORY    Past Medical History:  Diagnosis Date   AICD (automatic cardioverter/defibrillator) present 01/05/2021   [employer]   Anxiety    Coronary artery disease    Essential (primary) hypertension 06/17/2020   Myocardial infarction [facility])    Radiculopathy, lumbar region 11/14/2019     FAMILY HISTORY   History reviewed. No pertinent family history.  SOCIAL HISTORY:   Social History   Socioeconomic History   Marital status: Single    Spouse name: Not on file   Number of children: Not on file   Years of education: Not on file   Highest education level: Not on file  Occupational History    Employer: [employer]  Tobacco Use   Smoking status: Never   Smokeless tobacco: Never  Vaping Use   Vaping status: Never Used  Substance and Sexual Activity    Alcohol use: Yes    Alcohol/week: 1.0 standard drink of alcohol    Types: 1 Standard drinks or equivalent per week   Drug use: No   Sexual activity: Yes    Birth control/protection: None  Other Topics Concern   Not on file  Social History Narrative   Not on file   Social Drivers of Health   Financial Resource Strain: Not on file  Food Insecurity: Not on file  Transportation Needs: Not on file  Physical Activity: Not on file  Stress: Not on file  Social Connections: Unknown (02/06/2022)   Received from [facility], [facility]   Social Network    Social Network: Not on file  Intimate Partner Violence: Unknown (12/29/2021)   Received from [facility], [facility]   HITS    Physically Hurt: Not on file    Insult or Talk Down To: Not on file    Threaten Physical Harm: Not on file    Scream or Curse: Not on file    ALLERGIES:    Allergies  Allergen Reactions   Fenofibrate  Other (See Comments)    GI upset, Joint pain     CURRENT MEDICATIONS:    Current Outpatient Medications  Medication Sig Dispense Refill   acetaminophen  (TYLENOL ) 325 MG tablet Take 2 tablets (650 mg total) by mouth every 6 (six) hours as needed for mild pain.     ALPRAZolam  (XANAX ) 1 MG tablet Take 1 mg by mouth at bedtime  as needed for anxiety.     atorvastatin  (LIPITOR ) 80 MG tablet TAKE 1 TABLET(80 MG) BY MOUTH DAILY 90 tablet 1   carvedilol  (COREG ) 6.25 MG tablet TAKE 1 TABLET(6.25 MG) BY MOUTH TWICE DAILY WITH A MEAL 60 tablet 4   digoxin  (LANOXIN ) 0.125 MG tablet Take 0.5 tablets (0.0625 mg total) by mouth daily. 45 tablet 3   ENTRESTO  49-51 MG TAKE 1 TABLET BY MOUTH TWICE DAILY 60 tablet 11   icosapent  Ethyl (VASCEPA ) 1 g capsule Take 2 capsules (2 g total) by mouth 2 (two) times daily. 120 capsule 11   JARDIANCE  10 MG TABS tablet TAKE 1 TABLET(10 MG) BY MOUTH DAILY BEFORE BREAKFAST 30 tablet 11   linaclotide (LINZESS) 145 MCG CAPS capsule Take 145 mcg by mouth daily before breakfast.      Multiple Vitamin (MULTIVITAMIN WITH MINERALS) TABS tablet Take 1 tablet by mouth in the morning.     spironolactone  (ALDACTONE ) 25 MG tablet TAKE 1 TABLET(25 MG) BY MOUTH AT BEDTIME 90 tablet 3   torsemide  (DEMADEX ) 20 MG tablet Take 0.5 tablets (10 mg total) by mouth daily. 30 tablet 3   traZODone  (DESYREL ) 100 MG tablet Take 100 mg by mouth at bedtime.     warfarin (COUMADIN ) 2.5 MG tablet TAKE 1 TO 1 AND 1/2 TABLETS BY MOUTH DAILY AS DIRECTED BY COUMADIN  CLINIC 100 tablet 1   No current facility-administered medications for this visit.    REVIEW OF SYSTEMS:   [X]  denotes positive finding, [ ]  denotes negative finding Cardiac  Comments:  Chest pain or chest pressure:    Shortness of breath upon exertion: x   Short of breath when lying flat:    Irregular heart rhythm:        Vascular    Pain in calf, thigh, or hip brought on by ambulation:    Pain in feet at night that wakes you up from your sleep:     Blood clot in your veins:    Leg swelling:         Pulmonary    Oxygen at home:    Productive cough:     Wheezing:         Neurologic    Sudden weakness in arms or legs:     Sudden numbness in arms or legs:     Sudden onset of difficulty speaking or slurred speech:    Temporary loss of vision in one eye:     Problems with dizziness:         Gastrointestinal    Blood in stool:      Vomited blood:         Genitourinary    Burning when urinating:     Blood in urine:        Psychiatric    Major depression:         Hematologic    Bleeding problems:    Problems with blood clotting too easily:        Skin    Rashes or ulcers:        Constitutional    Fever or chills:     PHYSICAL EXAM:   Vitals:   10/08/23 0817  BP: (!) 89/54  Pulse: 63  Resp: 20  Temp: 98.6 F (37 C)  SpO2: 96%  Weight: 154 lb 1.6 oz (69.9 kg)  Height: 5' 8 (1.727 m)    GENERAL: The patient is a well-nourished male, in no acute distress. The vital signs are documented  above. CARDIAC: There is a regular rate and rhythm.  VASCULAR: Ultrasound was used to evaluate the carotid bifurcation which is in the mid neck. PULMONARY: Nonlabored respirations ABDOMEN: Soft and non-tender with normal pitched bowel sounds.  MUSCULOSKELETAL: There are no major deformities or cyanosis. NEUROLOGIC: No focal weakness or paresthesias are detected. SKIN: There are no ulcers or rashes noted. PSYCHIATRIC: The patient has a normal affect.  STUDIES:   I have reviewed the following Right Carotid: There is no evidence of stenosis in the right ICA.   Left Carotid: There is no evidence of stenosis in the left ICA.   Vertebrals:  Bilateral vertebral arteries demonstrate antegrade flow.  Subclavians: Normal flow hemodynamics were seen in bilateral subclavian               arteries.  ASSESSMENT and PLAN   NYHA class III heart failure: The patient has persistent symptoms of shortness of breath and fatigue despite goal-directed medical therapy.  He would be an excellent candidate for Barostim therapy.  I discussed the details of the operation as well as the risks and benefits, and he wishes to proceed.  Of note, he has had a clavicle repair and infraclavicular incisions from his Impella.  I will most likely make incision below his Impella incision, but possibly may go through the same incision.  He will need to be off of his Coumadin .  I will discuss with cardiology if he needs a Lovenox bridge.   Malvina Serene CLORE, MD, FACS [facility] Specialists of [facility] [phone] Pager [phone]

## 2023-10-08 NOTE — Telephone Encounter (Signed)
 Spoke with pt's girlfriend (per DPR on file) and explained that Dr. Celine RN is out of the office today but he is here and we will forward this information to both of them. Pt's girlfriend verbalized understanding and had no further questions at this time.

## 2023-10-09 NOTE — Telephone Encounter (Signed)
 This has been addressed in another encounter.  Please see that encounter for complete details.

## 2023-10-15 NOTE — Telephone Encounter (Signed)
Peer to Peer was done- The company has a blanket policy of refusing CCM

## 2023-10-17 ENCOUNTER — Encounter: Payer: Self-pay | Admitting: Surgery

## 2023-10-19 ENCOUNTER — Encounter (HOSPITAL_COMMUNITY): Payer: Self-pay

## 2023-10-19 DIAGNOSIS — I5022 Chronic systolic (congestive) heart failure: Secondary | ICD-10-CM

## 2023-10-22 ENCOUNTER — Other Ambulatory Visit (HOSPITAL_COMMUNITY): Payer: Self-pay | Admitting: Cardiology

## 2023-10-23 LAB — PRO B NATRIURETIC PEPTIDE: NT-Pro BNP: 536 pg/mL — ABNORMAL HIGH (ref 0–121)

## 2023-10-29 ENCOUNTER — Ambulatory Visit: Payer: 59 | Attending: Cardiology

## 2023-10-29 ENCOUNTER — Telehealth: Payer: Self-pay | Admitting: Internal Medicine

## 2023-10-29 DIAGNOSIS — Z5181 Encounter for therapeutic drug level monitoring: Secondary | ICD-10-CM

## 2023-10-29 DIAGNOSIS — I824Y2 Acute embolism and thrombosis of unspecified deep veins of left proximal lower extremity: Secondary | ICD-10-CM | POA: Diagnosis not present

## 2023-10-29 DIAGNOSIS — I4891 Unspecified atrial fibrillation: Secondary | ICD-10-CM

## 2023-10-29 LAB — POCT INR: INR: 2.8 (ref 2.0–3.0)

## 2023-10-29 NOTE — Telephone Encounter (Signed)
Patient reports he was playing baseball, the ball skipped out of his glove and hit left shoulder. Denies direct impact to ICD. Remote transmission received and appears to be working in normal function with data provided. Patient advised of findings and if he has any swelling or any changes to let us know. Pt voiced understanding and appreciative of call.

## 2023-10-29 NOTE — Telephone Encounter (Signed)
Attempted to contact patient to send manual transmission. No answer, LMTCB.

## 2023-10-29 NOTE — Telephone Encounter (Signed)
I told the pt to send a manual transmission for the nurse to review. I told him the nurse will give him a call back.  Transmission received 10/29/2023.

## 2023-10-29 NOTE — Telephone Encounter (Signed)
Pt states he got hit in his chest with a baseball and thinks it effected his loop recorder, please advise.

## 2023-10-29 NOTE — Patient Instructions (Signed)
continue 1 tablet daily except 1.5 tablets on  Wednesdays. Recheck INR in 6 weeks. Coumadin Clinic [phone]

## 2023-11-07 ENCOUNTER — Telehealth (HOSPITAL_COMMUNITY): Payer: Self-pay

## 2023-11-07 NOTE — Telephone Encounter (Signed)
Called to confirm/remind patient of their appointment at the [facility] on 11/08/23.   Patient reminded to bring all medications and/or complete list.  Confirmed patient has transportation. Gave directions, instructed to utilize valet parking.  Confirmed appointment prior to ending call.

## 2023-11-08 ENCOUNTER — Ambulatory Visit (HOSPITAL_COMMUNITY)
Admission: RE | Admit: 2023-11-08 | Discharge: 2023-11-08 | Disposition: A | Discharge status: Home / Self Care | Payer: 59 | Source: Ambulatory Visit | Attending: Adult Health | Admitting: Adult Health

## 2023-11-08 VITALS — BP 104/70 | HR 80 | Wt 155.8 lb

## 2023-11-08 DIAGNOSIS — I5022 Chronic systolic (congestive) heart failure: Secondary | ICD-10-CM | POA: Insufficient documentation

## 2023-11-08 DIAGNOSIS — Z951 Presence of aortocoronary bypass graft: Secondary | ICD-10-CM | POA: Diagnosis not present

## 2023-11-08 DIAGNOSIS — I4891 Unspecified atrial fibrillation: Secondary | ICD-10-CM | POA: Diagnosis not present

## 2023-11-08 DIAGNOSIS — Z7901 Long term (current) use of anticoagulants: Secondary | ICD-10-CM | POA: Diagnosis not present

## 2023-11-08 DIAGNOSIS — Z95810 Presence of automatic (implantable) cardiac defibrillator: Secondary | ICD-10-CM | POA: Diagnosis not present

## 2023-11-08 DIAGNOSIS — I2510 Atherosclerotic heart disease of native coronary artery without angina pectoris: Secondary | ICD-10-CM | POA: Insufficient documentation

## 2023-11-08 DIAGNOSIS — E781 Pure hyperglyceridemia: Secondary | ICD-10-CM | POA: Diagnosis not present

## 2023-11-08 DIAGNOSIS — I340 Nonrheumatic mitral (valve) insufficiency: Secondary | ICD-10-CM | POA: Insufficient documentation

## 2023-11-08 DIAGNOSIS — I513 Intracardiac thrombosis, not elsewhere classified: Secondary | ICD-10-CM | POA: Insufficient documentation

## 2023-11-08 DIAGNOSIS — R0609 Other forms of dyspnea: Secondary | ICD-10-CM | POA: Diagnosis present

## 2023-11-08 DIAGNOSIS — I255 Ischemic cardiomyopathy: Secondary | ICD-10-CM | POA: Diagnosis not present

## 2023-11-08 DIAGNOSIS — I252 Old myocardial infarction: Secondary | ICD-10-CM | POA: Diagnosis present

## 2023-11-08 DIAGNOSIS — I97190 Other postprocedural cardiac functional disturbances following cardiac surgery: Secondary | ICD-10-CM | POA: Insufficient documentation

## 2023-11-08 MED ORDER — TORSEMIDE 20 MG PO TABS: 20.0000 mg | ORAL_TABLET | Freq: Every day | ORAL | 3 refills | Status: DC

## 2023-11-08 NOTE — Progress Notes (Signed)
Advanced Heart Failure Clinic Note  PCP: Sigmund Hazel, MD HF Cardiology: Dr. Shirlee Latch  CT Surgery: Dr. Dorris Fetch   53 y.o. male s/p back surgery 05/2020 was admitted for acute inferior and anterolateral MI on 06/25/20. LHC showed thrombotic occlusion of distal RCA that was treated with PTCA and thrombectomy.  He was also noted to have chronic total occlusion of the LAD with collaterals from the right (thus this territory was affected by the RCA MI) as well as 90% complex proximal LCx stenosis. EF 25-30% + apical thrombus and severe mitral regurgitation. RV normal. Also w/ post MI pericarditis, treated w/ colchicine. Course further c/b cardiogenic shock and acute hypoxic respiratory failure w/ component of septic shock 2/2 PNA, requiring intubation and treatment w/ abx. Once stabilized, he underwent CABG x 3 (LIMA-LAD, Lt radial-OM1 and sequential SVG-PDA/PLA) + MV repair and placement of Impella 5.5 on 10/13 by Dr. Dorris Fetch.   Post operatively, he developed tamponade and was taken back to the OR on 10/14 with large hematoma obstructing right atrium. Impella removal 10/21 w/ stable hemodynamics and able to tolerate initiation of GDMT.  Also had post-operative afib treated w/ amiodarone. He was discharged home on 07/21/20.   Echo in 3/22 showed EF 20-25% with diffuse hypokinesis, mildly decreased RV systolic function, no LV thrombus, normal IVC, s/p MV repair with trivial MR and mild stenosis (mean gradient 4 mmHg).  [employer] ICD placed in 4/22.  Later in 4/22, he had L4-L5 spinal fusion.   CPX in 10/22 showed mild-moderate HF limitation. Echo 4/23 EF 25-30%, no LV thrombus, normal RV.  Echo in 8/24 showed EF 25% with mild mitral regurgitation s/p MV repair, normal RV. CPX in 9/24 showed a mild to moderate HF limitation.   He has been seen by Dr Myra Gianotti and was deemed a candidate for Barostim Therapy. Denies by insurance.   Today he returns for HF follow up. SOB with exertion. Denies  PND/Orthopnea. Appetite ok. No fever or chills. Weight at home 150 -153 pounds. Taking all medications. He has been taking 20 mg torsemide daily due to abdominal bloating. Says this where notices fluid.    Review of systems complete and found to be negative unless listed in HPI.   PMH: 1. Anxiety 2. Hyperlipidemia 3. CAD: Inferior and anterolateral MI 10/21.  LHC showed thrombotic occlusion distal RCA treated with PTCA/thrombectomy, CTO LAD with R=>L collaterals, 90% complex proximal LCx stenosis.  - CABG x 4 in 10/21 with LIMA-LAD, left radial-OM1, sequential SVG-PDA/PLV.  4. Mitral regurgitation: Suspect infarct-related MR.   - Mitral valve repair in 10/21 with CABG.  5. LV thrombus  6. Chronic systolic CHF: ischemic cardiomyopathy.  [employer] ICD.  Echo (10/21) with EF 25-30%, apical thrombus, severe MR, normal RV.   - Echo (3/22): EF 20-25% with diffuse hypokinesis, mildly decreased RV systolic function, no LV thrombus, normal IVC, s/p MV repair with trivial MR and mild stenosis (mean gradient 4 mmHg).   - CPX (10/22): RER 1.17, VE/VCO2 slope 31, peak VO2 22.7 => mild-moderate HF limitation.  - Echo (4/23): EF 25-30%, no LV thrombus, normal RV. - Echo (8/24): EF 25% with mild mitral regurgitation s/p MV repair, normal RV.  - CPX (9/24): peak VO2 24, VE/VCO2 slope 33, RER 1.09 7. Post-infarct pericarditis in 10/21. 8. Atrial fibrillation: Post-op in 10/21.  9. H/o back surgery 9/21: Discectomy.   10. CKD stage 3  Current Outpatient Medications  Medication Sig Dispense Refill   acetaminophen (TYLENOL) 325 MG tablet  Take 2 tablets (650 mg total) by mouth every 6 (six) hours as needed for mild pain.     ALPRAZolam (XANAX) 1 MG tablet Take 1 mg by mouth at bedtime as needed for anxiety.     atorvastatin (LIPITOR) 80 MG tablet TAKE 1 TABLET(80 MG) BY MOUTH DAILY 90 tablet 1   carvedilol (COREG) 6.25 MG tablet TAKE 1 TABLET(6.25 MG) BY MOUTH TWICE DAILY WITH A MEAL 60 tablet 4    digoxin (LANOXIN) 0.125 MG tablet Take 0.5 tablets (0.0625 mg total) by mouth daily. 45 tablet 3   ENTRESTO 49-51 MG TAKE 1 TABLET BY MOUTH TWICE DAILY 60 tablet 11   icosapent Ethyl (VASCEPA) 1 g capsule Take 2 capsules (2 g total) by mouth 2 (two) times daily. 120 capsule 11   JARDIANCE 10 MG TABS tablet TAKE 1 TABLET(10 MG) BY MOUTH DAILY BEFORE BREAKFAST 30 tablet 11   linaclotide (LINZESS) 145 MCG CAPS capsule Take 145 mcg by mouth daily before breakfast.     Multiple Vitamin (MULTIVITAMIN WITH MINERALS) TABS tablet Take 1 tablet by mouth in the morning.     spironolactone (ALDACTONE) 25 MG tablet TAKE 1 TABLET(25 MG) BY MOUTH AT BEDTIME 90 tablet 3   torsemide (DEMADEX) 20 MG tablet Take 0.5 tablets (10 mg total) by mouth daily. (Patient taking differently: Take 20 mg by mouth daily.) 30 tablet 3   traZODone (DESYREL) 100 MG tablet Take 100 mg by mouth at bedtime.     warfarin (COUMADIN) 2.5 MG tablet TAKE 1 TO 1 AND 1/2 TABLETS BY MOUTH DAILY AS DIRECTED BY COUMADIN CLINIC 100 tablet 1   No current facility-administered medications for this encounter.   Allergies  Allergen Reactions   Fenofibrate Other (See Comments)    GI upset, Joint pain    Social History   Socioeconomic History   Marital status: Single    Spouse name: Not on file   Number of children: Not on file   Years of education: Not on file   Highest education level: Not on file  Occupational History    Employer: [employer]  Tobacco Use   Smoking status: Never   Smokeless tobacco: Never  Vaping Use   Vaping status: Never Used  Substance and Sexual Activity   Alcohol use: Yes    Alcohol/week: 1.0 standard drink of alcohol    Types: 1 Standard drinks or equivalent per week   Drug use: No   Sexual activity: Yes    Birth control/protection: None  Other Topics Concern   Not on file  Social History Narrative   Not on file   Social Drivers of Health   Financial Resource Strain: Not on file  Food  Insecurity: Not on file  Transportation Needs: Not on file  Physical Activity: Not on file  Stress: Not on file  Social Connections: Unknown (02/06/2022)   Received from [facility], [facility]   Social Network    Social Network: Not on file  Intimate Partner Violence: Unknown (12/29/2021)   Received from [facility], [facility]   HITS    Physically Hurt: Not on file    Insult or Talk Down To: Not on file    Threaten Physical Harm: Not on file    Scream or Curse: Not on file   FH: No family history of premature CAD.   BP 104/70   Pulse 80   Wt 70.7 kg (155 lb 12.8 oz)   SpO2 97%   BMI 23.69 kg/m  Wt Readings from Last 3 Encounters:  11/08/23 70.7 kg (155 lb 12.8 oz)  10/08/23 69.9 kg (154 lb 1.6 oz)  08/30/23 67.8 kg (149 lb 6.4 oz)   PHYSICAL EXAM: General:  No resp difficulty Neck: supple. no JVD. Cor: PMI nondisplaced. Regular rate & rhythm. No rubs, gallops or murmurs. Lungs: clear Abdomen: soft, nontender, nondistended.  Extremities: no cyanosis, clubbing, rash, edema Neuro: alert & orientedx3  ASSESSMENT & PLAN: 1. CAD: Late presentation inferior and anterolateral MI 10/21.  Cath showed thrombotic occlusion distal RCA, CTO LAD with collaterals from right, and complex 90% proximal LCx stenosis.  He had PTCA/thrombectomy RCA with good flow at end of procedure.  Suspect he had damage to LAD territory as well as RCA territory as RCA collateralized the LAD.  S/p CABG w/ impella support with LIMA-LAD, left radial to OM, seq SVG-PDA/PLV. Post op course c/b tamponade and was taken back to the OR on 10/14 for drainage, found to have large hematoma obstructing right atrium.   - No chest pain.   - no ASA given need for coumadin  - Continue atorvastatin 80 mg daily.  2. 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 Echo in 10/21 with EF 25-30%.  Echo in 3/22 showed EF 20-25%, diffuse hypokinesis, mildly decreased RV systolic  function.  He now has a Environmental manager ICD.  CPX in 10/22 with mild-moderate HF limitation.  Echo 4/23 showed EF 25-30%, no LV thrombus, normal RV.  Echo in 8/24 showed that EF remains low at 25%, s/p MV repair with mild MR, RV normal. CPX in 9/24 showed mild to moderate HF limitation.   ReDs reading: 28 %, normal. Discussed ReDs result.  NYHA III. Appears euvolemic. Continue torsemide 20 mg daily. Refill sent to his pharmacy . - Continue Entresto 49/51 bid - Continue spironolactone 25 mg qhs. - Continue Coreg 6.25 mg bid. Titration limited by fatigue and low BP  - Continue digoxin 0.0625 mg daily. - Continue Jardiance 10 mg daily.  - Narrow QRS so not CRT candidate.   - He is interested in baroreceptor activation therapy. Saw Dr Myra Gianotti but has been waiting for insurance approval.   3. Severe MR: Suspect infarct-related MR. s/p MV repair at time of CABG 10/21.  Mild MR on 4/23 echo. Mild on 8/24 echo.  4. LV thrombus:  - Continue warfarin indefinitely given ongoing low EF.  5. Post-operative atrial fibrillation:  Has been in SR and was taken off amiodarone. He is on warfarin. INR followed by [facility]  6. Hypertriglyceridemia: Unable to tolerate fenofibrate due to GI upset. Now on Vescepa.     Follow up with Dr Shirlee Latch later this month.    Tonye Becket, NP 11/08/23

## 2023-11-08 NOTE — Progress Notes (Signed)
ReDS Vest / Clip - 11/08/23 1500       ReDS Vest / Clip   Station Marker C    Ruler Value 25.5    ReDS Value Range Low volume    ReDS Actual Value 28

## 2023-11-08 NOTE — Patient Instructions (Signed)
Medication Changes:  INCREASE TORSEMIDE TO 20MG  ONCE DAILY   Follow-Up in: AS SCHEDULED WITH DR. Shirlee Latch   At the Advanced Heart Failure Clinic, you and your health needs are our priority. We have a designated team specialized in the treatment of Heart Failure. This Care Team includes your primary Heart Failure Specialized Cardiologist (physician), Advanced Practice Providers (APPs- Physician Assistants and Nurse Practitioners), and Pharmacist who all work together to provide you with the care you need, when you need it.   You may see any of the following providers on your designated Care Team at your next follow up:  Dr. Arvilla Meres Dr. Marca Ancona Dr. Dorthula Nettles Dr. Theresia Bough Tonye Becket, NP Robbie Lis, [place] [facility] [place], [place] Brynda Peon, NP [place] Lee, NP Karle Plumber, PharmD   Please be sure to bring in all your medications bottles to every appointment.   Need to Contact us:  If you have any questions or concerns before your next appointment please send [place] a message through [place] or call our office at [phone].    TO LEAVE A MESSAGE FOR THE NURSE SELECT OPTION 2, PLEASE LEAVE A MESSAGE INCLUDING: YOUR NAME DATE OF BIRTH CALL BACK NUMBER REASON FOR CALL**this is important as we prioritize the call backs  YOU WILL RECEIVE A CALL BACK THE SAME DAY AS LONG AS YOU CALL BEFORE 4:00 PM

## 2023-11-09 NOTE — Progress Notes (Signed)
Remote ICD transmission.

## 2023-11-20 ENCOUNTER — Other Ambulatory Visit (HOSPITAL_COMMUNITY): Payer: Self-pay

## 2023-11-20 DIAGNOSIS — I5022 Chronic systolic (congestive) heart failure: Secondary | ICD-10-CM

## 2023-11-20 MED ORDER — SPIRONOLACTONE 25 MG PO TABS: ORAL_TABLET | ORAL | 3 refills | Status: AC

## 2023-11-21 ENCOUNTER — Encounter: Payer: Self-pay | Admitting: Internal Medicine

## 2023-11-23 ENCOUNTER — Ambulatory Visit (HOSPITAL_COMMUNITY)
Admission: RE | Admit: 2023-11-23 | Discharge: 2023-11-23 | Disposition: A | Discharge status: Home / Self Care | Payer: 59 | Source: Ambulatory Visit | Attending: Cardiology | Admitting: Cardiology

## 2023-11-23 ENCOUNTER — Encounter (HOSPITAL_COMMUNITY): Payer: Self-pay | Admitting: Cardiology

## 2023-11-23 ENCOUNTER — Telehealth (HOSPITAL_COMMUNITY): Payer: Self-pay

## 2023-11-23 VITALS — BP 100/62 | HR 77 | Wt 156.0 lb

## 2023-11-23 DIAGNOSIS — Z79899 Other long term (current) drug therapy: Secondary | ICD-10-CM | POA: Diagnosis not present

## 2023-11-23 DIAGNOSIS — Z95810 Presence of automatic (implantable) cardiac defibrillator: Secondary | ICD-10-CM | POA: Diagnosis not present

## 2023-11-23 DIAGNOSIS — I5022 Chronic systolic (congestive) heart failure: Secondary | ICD-10-CM | POA: Diagnosis not present

## 2023-11-23 DIAGNOSIS — N183 Chronic kidney disease, stage 3 unspecified: Secondary | ICD-10-CM | POA: Insufficient documentation

## 2023-11-23 DIAGNOSIS — Z951 Presence of aortocoronary bypass graft: Secondary | ICD-10-CM | POA: Diagnosis not present

## 2023-11-23 DIAGNOSIS — Z7984 Long term (current) use of oral hypoglycemic drugs: Secondary | ICD-10-CM | POA: Diagnosis not present

## 2023-11-23 DIAGNOSIS — Z7901 Long term (current) use of anticoagulants: Secondary | ICD-10-CM | POA: Diagnosis not present

## 2023-11-23 DIAGNOSIS — I340 Nonrheumatic mitral (valve) insufficiency: Secondary | ICD-10-CM | POA: Insufficient documentation

## 2023-11-23 DIAGNOSIS — I2582 Chronic total occlusion of coronary artery: Secondary | ICD-10-CM | POA: Diagnosis not present

## 2023-11-23 DIAGNOSIS — I4891 Unspecified atrial fibrillation: Secondary | ICD-10-CM | POA: Insufficient documentation

## 2023-11-23 DIAGNOSIS — E781 Pure hyperglyceridemia: Secondary | ICD-10-CM | POA: Insufficient documentation

## 2023-11-23 DIAGNOSIS — I2510 Atherosclerotic heart disease of native coronary artery without angina pectoris: Secondary | ICD-10-CM | POA: Insufficient documentation

## 2023-11-23 DIAGNOSIS — R0602 Shortness of breath: Secondary | ICD-10-CM | POA: Insufficient documentation

## 2023-11-23 DIAGNOSIS — I252 Old myocardial infarction: Secondary | ICD-10-CM | POA: Diagnosis not present

## 2023-11-23 LAB — BASIC METABOLIC PANEL
Anion gap: 13 (ref 5–15)
BUN: 47 mg/dL — ABNORMAL HIGH (ref 6–20)
CO2: 28 mmol/L (ref 22–32)
Calcium: 9.9 mg/dL (ref 8.9–10.3)
Chloride: 96 mmol/L — ABNORMAL LOW (ref 98–111)
Creatinine, Ser: 2.27 mg/dL — ABNORMAL HIGH (ref 0.61–1.24)
GFR, Estimated: 34 mL/min — ABNORMAL LOW (ref >60)
Glucose, Bld: 101 mg/dL — ABNORMAL HIGH (ref 70–99)
Potassium: 3.9 mmol/L (ref 3.5–5.1)
Sodium: 137 mmol/L (ref 135–145)

## 2023-11-23 LAB — LIPID PANEL
Cholesterol: 119 mg/dL (ref 0–200)
HDL: 29 mg/dL — ABNORMAL LOW (ref >40)
LDL Cholesterol: 53 mg/dL (ref 0–99)
Total CHOL/HDL Ratio: 4.1 ratio
Triglycerides: 184 mg/dL — ABNORMAL HIGH (ref <150)
VLDL: 37 mg/dL (ref 0–40)

## 2023-11-23 LAB — DIGOXIN LEVEL: Digoxin Level: 0.4 ng/mL — ABNORMAL LOW (ref 0.8–2.0)

## 2023-11-23 MED ORDER — TORSEMIDE 20 MG PO TABS: 20.0000 mg | ORAL_TABLET | ORAL | Status: DC

## 2023-11-23 NOTE — Telephone Encounter (Signed)
 Spoke to patient aware to hold torsemide x 2 days then decrease medication to 20 mg every other day.

## 2023-11-23 NOTE — Patient Instructions (Signed)
 The has been no changes to your medications.  Labs done today, your results will be available in MyChart, we will contact you for abnormal readings.  Your physician recommends that you schedule a follow-up appointment in: 3 months.  If you have any questions or concerns before your next appointment please send [place] a message through Central or call our office at [phone].    TO LEAVE A MESSAGE FOR THE NURSE SELECT OPTION 2, PLEASE LEAVE A MESSAGE INCLUDING: YOUR NAME DATE OF BIRTH CALL BACK NUMBER REASON FOR CALL**this is important as we prioritize the call backs  YOU WILL RECEIVE A CALL BACK THE SAME DAY AS LONG AS YOU CALL BEFORE 4:00 PM  At the [facility] Clinic, you and your health needs are our priority. As part of our continuing mission to provide you with exceptional heart care, we have created designated Provider Care Teams. These Care Teams include your primary Cardiologist (physician) and Advanced Practice Providers (APPs- Physician Assistants and Nurse Practitioners) who all work together to provide you with the care you need, when you need it.   You may see any of the following providers on your designated Care Team at your next follow up: Dr Arvilla Meres Dr Marca Ancona Dr. Dorthula Nettles Dr. Clearnce Hasten Amy Filbert Schilder, NP Robbie Lis, [place] [facility] [place], [place] Brynda Peon, NP [place] Lee, NP Clarisa Kindred, NP Karle Plumber, PharmD Enos Fling, PharmD   Please be sure to bring in all your medications bottles to every appointment.    Thank you for choosing [facility]

## 2023-11-23 NOTE — Telephone Encounter (Signed)
-----   Message from Marca Ancona sent at 11/23/2023  4:13 PM EST ----- Good lipids.  Creatinine is up a lot.  He was not volume overloaded by exam, echo, or device check today.  He thinks he is volume overloaded due to abdominal swelling.  However, I think he is not.  He really needs to cut back some on torsemide due to renal function.  Would hold torsemide x 2 days then decrease to 20 mg every other day after that.

## 2023-11-25 NOTE — Progress Notes (Signed)
 Advanced Heart Failure Clinic Note  PCP: Sigmund Hazel, MD HF Cardiology: Dr. Shirlee Latch  CT Surgery: Dr. Dorris Fetch   Chief complaint: CHF  53 y.o. male s/p back surgery 05/2020 was admitted for acute inferior and anterolateral MI on 06/25/20. LHC showed thrombotic occlusion of distal RCA that was treated with PTCA and thrombectomy.  He was also noted to have chronic total occlusion of the LAD with collaterals from the right (thus this territory was affected by the RCA MI) as well as 90% complex proximal LCx stenosis. EF 25-30% + apical thrombus and severe mitral regurgitation. RV normal. Also w/ post MI pericarditis, treated w/ colchicine. Course further c/b cardiogenic shock and acute hypoxic respiratory failure w/ component of septic shock 2/2 PNA, requiring intubation and treatment w/ abx. Once stabilized, he underwent CABG x 3 (LIMA-LAD, Lt radial-OM1 and sequential SVG-PDA/PLA) + MV repair and placement of Impella 5.5 on 10/13 by Dr. Dorris Fetch.   Post operatively, he developed tamponade and was taken back to the OR on 10/14 with large hematoma obstructing right atrium. Impella removal 10/21 w/ stable hemodynamics and able to tolerate initiation of GDMT.  Also had post-operative afib treated w/ amiodarone. He was discharged home on 07/21/20.   Echo in 3/22 showed EF 20-25% with diffuse hypokinesis, mildly decreased RV systolic function, no LV thrombus, normal IVC, s/p MV repair with trivial MR and mild stenosis (mean gradient 4 mmHg).  [employer] ICD placed in 4/22.  Later in 4/22, he had L4-L5 spinal fusion.   CPX in 10/22 showed mild-moderate HF limitation. Echo 4/23 EF 25-30%, no LV thrombus, normal RV.  Echo in 8/24 showed EF 25% with mild mitral regurgitation s/p MV repair, normal RV. CPX in 9/24 showed a mild to moderate HF limitation.   He has been seen by Dr Myra Gianotti and was deemed a candidate for Barostim Therapy. Denied by insurance.  We next tried to get him a cardiac  contractility modulator.  He was also denied for this by his insurance.  Appeals for both devices were denied.   Today he returns for HF follow up.  Weight is stable.  He feels like his abdomen is bloated.  No peripheral edema.  He has generalized fatigue.  He is short of breath when he gets on the elliptical or treadmill.  Generally ok walking short distances on flat ground. Symptoms gradually getting worse. Occasional lightheadedness if he stands too fast.   ECG (personally reviewed): NSR, old ASMI, old inferior MI.   Labs (11/24): LDL 47, TGs 86 Labs (4/25): Pro-BNP 536, K 4.7, creatinine 1.29.   [employer] device interrogation: HeartLogic 2  Review of systems complete and found to be negative unless listed in HPI.    PMH: 1. Anxiety 2. Hyperlipidemia 3. CAD: Inferior and anterolateral MI 10/21.  LHC showed thrombotic occlusion distal RCA treated with PTCA/thrombectomy, CTO LAD with R=>L collaterals, 90% complex proximal LCx stenosis.  - CABG x 4 in 10/21 with LIMA-LAD, left radial-OM1, sequential SVG-PDA/PLV.  4. Mitral regurgitation: Suspect infarct-related MR.   - Mitral valve repair in 10/21 with CABG.  5. LV thrombus  6. Chronic systolic CHF: ischemic cardiomyopathy.  [employer] ICD.  Echo (10/21) with EF 25-30%, apical thrombus, severe MR, normal RV.   - Echo (3/22): EF 20-25% with diffuse hypokinesis, mildly decreased RV systolic function, no LV thrombus, normal IVC, s/p MV repair with trivial MR and mild stenosis (mean gradient 4 mmHg).   - CPX (10/22): RER 1.17, VE/VCO2 slope 31,  peak VO2 22.7 => mild-moderate HF limitation.  - Echo (4/23): EF 25-30%, no LV thrombus, normal RV. - Echo (8/24): EF 25% with mild mitral regurgitation s/p MV repair, normal RV.  - CPX (9/24): peak VO2 24, VE/VCO2 slope 33, RER 1.09 7. Post-infarct pericarditis in 10/21. 8. Atrial fibrillation: Post-op in 10/21.  9. H/o back surgery 9/21: Discectomy.   10. CKD stage 3  Current  Outpatient Medications  Medication Sig Dispense Refill   acetaminophen (TYLENOL) 325 MG tablet Take 2 tablets (650 mg total) by mouth every 6 (six) hours as needed for mild pain.     ALPRAZolam (XANAX) 1 MG tablet Take 1 mg by mouth at bedtime as needed for anxiety.     atorvastatin (LIPITOR) 80 MG tablet TAKE 1 TABLET(80 MG) BY MOUTH DAILY 90 tablet 1   carvedilol (COREG) 6.25 MG tablet TAKE 1 TABLET(6.25 MG) BY MOUTH TWICE DAILY WITH A MEAL 60 tablet 4   digoxin (LANOXIN) 0.125 MG tablet Take 0.5 tablets (0.0625 mg total) by mouth daily. 45 tablet 3   ENTRESTO 49-51 MG TAKE 1 TABLET BY MOUTH TWICE DAILY 60 tablet 11   icosapent Ethyl (VASCEPA) 1 g capsule Take 2 capsules (2 g total) by mouth 2 (two) times daily. 120 capsule 11   JARDIANCE 10 MG TABS tablet TAKE 1 TABLET(10 MG) BY MOUTH DAILY BEFORE BREAKFAST 30 tablet 11   linaclotide (LINZESS) 145 MCG CAPS capsule Take 145 mcg by mouth daily before breakfast.     Multiple Vitamin (MULTIVITAMIN WITH MINERALS) TABS tablet Take 1 tablet by mouth in the morning.     spironolactone (ALDACTONE) 25 MG tablet TAKE 1 TABLET(25 MG) BY MOUTH AT BEDTIME 90 tablet 3   traZODone (DESYREL) 100 MG tablet Take 100 mg by mouth at bedtime.     warfarin (COUMADIN) 2.5 MG tablet TAKE 1 TO 1 AND 1/2 TABLETS BY MOUTH DAILY AS DIRECTED BY COUMADIN CLINIC 100 tablet 1   torsemide (DEMADEX) 20 MG tablet Take 1 tablet (20 mg total) by mouth every other day.     No current facility-administered medications for this encounter.   Allergies  Allergen Reactions   Fenofibrate Other (See Comments)    GI upset, Joint pain    Social History   Socioeconomic History   Marital status: Single    Spouse name: Not on file   Number of children: Not on file   Years of education: Not on file   Highest education level: Not on file  Occupational History    Employer: [employer]  Tobacco Use   Smoking status: Never   Smokeless tobacco: Never  Vaping Use   Vaping  status: Never Used  Substance and Sexual Activity   Alcohol use: Yes    Alcohol/week: 1.0 standard drink of alcohol    Types: 1 Standard drinks or equivalent per week   Drug use: No   Sexual activity: Yes    Birth control/protection: None  Other Topics Concern   Not on file  Social History Narrative   Not on file   Social Drivers of Health   Financial Resource Strain: Not on file  Food Insecurity: Not on file  Transportation Needs: Not on file  Physical Activity: Not on file  Stress: Not on file  Social Connections: Unknown (02/06/2022)   Received from [facility], [facility]   Social Network    Social Network: Not on file  Intimate Partner Violence: Unknown (12/29/2021)   [facility]   HITS    Physically Hurt: Not on file    Insult or Talk Down To: Not on file    Threaten Physical Harm: Not on file    Scream or Curse: Not on file   FH: No family history of premature CAD.   BP 100/62   Pulse 77   Wt 70.8 kg (156 lb)   SpO2 97%   BMI 23.72 kg/m   Wt Readings from Last 3 Encounters:  11/23/23 70.8 kg (156 lb)  11/08/23 70.7 kg (155 lb 12.8 oz)  10/08/23 69.9 kg (154 lb 1.6 oz)   PHYSICAL EXAM: General: NAD Neck: No JVD, no thyromegaly or thyroid nodule.  Lungs: Clear to auscultation bilaterally with normal respiratory effort. CV: Nondisplaced PMI.  Heart regular S1/S2, no S3/S4, no murmur.  No peripheral edema.  No carotid bruit.  Normal pedal pulses.  Abdomen: Soft, nontender, no hepatosplenomegaly, no distention.  Skin: Intact without lesions or rashes.  Neurologic: Alert and oriented x 3.  Psych: Normal affect. Extremities: No clubbing or cyanosis.  HEENT: Normal.   ASSESSMENT & PLAN: 1. CAD: Late presentation inferior and anterolateral MI 10/21.  Cath showed thrombotic occlusion distal RCA, CTO LAD with collaterals from right, and complex 90% proximal LCx stenosis.  He had PTCA/thrombectomy RCA with good flow at end of  procedure.  Suspect he had damage to LAD territory as well as RCA territory as RCA collateralized the LAD.  S/p CABG w/ impella support with LIMA-LAD, left radial to OM, seq SVG-PDA/PLV. Post op course c/b tamponade and was taken back to the OR on 10/14 for drainage, found to have large hematoma obstructing right atrium.   - No chest pain.   - no ASA given need for coumadin  - Continue atorvastatin 80 mg daily. Check lipids today.  2. 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 Echo in 10/21 with EF 25-30%.  Echo in 3/22 showed EF 20-25%, diffuse hypokinesis, mildly decreased RV systolic function.  He now has a Environmental manager ICD.  CPX in 10/22 with mild-moderate HF limitation.  Echo 4/23 showed EF 25-30%, no LV thrombus, normal RV.  Echo in 8/24 showed that EF remains low at 25%, s/p MV repair with mild MR, RV normal. CPX in 9/24 showed mild to moderate HF limitation.  Patient is not volume overloaded by exam or HeartLogic, but NYHA class III symptoms present. - Continue Entresto 49/51 bid. No BP room to increase.  - Continue spironolactone 25 mg qhs. - Continue Coreg 6.25 mg bid. Titration limited by fatigue and low BP  - Continue digoxin 0.0625 mg daily. Check digoxin level.  - Continue Jardiance 10 mg daily.  - Continue torsemide 20 mg daily. May need to cut back if creatinine is higher.  BMET/BNP today.  - Narrow QRS so not CRT candidate.   - We are trying to get approval for cardiac contractility modulation.  He has NYHA class III symptoms and is on maximal medical management.  He would be an excellent candidate for CCM but insurance has turned him down.  Will appeal.   3. Severe MR: Suspect infarct-related MR. s/p MV repair at time of CABG 10/21.  Mild MR on 4/23 echo. Mild on 8/24 echo.  4. LV thrombus:  - Continue warfarin indefinitely given ongoing low EF.  5. Atrial fibrillation: Post-CABG. He remains in NSR.  - Continue warfarin.  6.  Hypertriglyceridemia: Unable to tolerate fenofibrate due to GI  upset. Now on Vascepa.  Check lipids today.   Followup in 3 months with APP.   I spent 32 minutes reviewing records, interviewing/examining patient, and managing orders.   Marca Ancona, MD 11/25/23

## 2023-12-03 ENCOUNTER — Encounter: Payer: Self-pay | Admitting: Internal Medicine

## 2023-12-03 ENCOUNTER — Other Ambulatory Visit (HOSPITAL_COMMUNITY): Payer: Self-pay

## 2023-12-03 ENCOUNTER — Telehealth (HOSPITAL_COMMUNITY): Payer: Self-pay

## 2023-12-03 DIAGNOSIS — I5022 Chronic systolic (congestive) heart failure: Secondary | ICD-10-CM

## 2023-12-03 MED ORDER — ATORVASTATIN CALCIUM 80 MG PO TABS: 80.0000 mg | ORAL_TABLET | Freq: Every day | ORAL | 3 refills | Status: AC

## 2023-12-03 NOTE — Telephone Encounter (Signed)
 Patient's Atorvastatin medication has been sent to his pharmacy.

## 2023-12-04 ENCOUNTER — Encounter: Payer: Self-pay | Admitting: Cardiology

## 2023-12-05 ENCOUNTER — Encounter (HOSPITAL_COMMUNITY): Payer: Self-pay

## 2023-12-10 ENCOUNTER — Ambulatory Visit: Payer: 59

## 2023-12-13 ENCOUNTER — Ambulatory Visit: Attending: Cardiology

## 2023-12-13 DIAGNOSIS — Z5181 Encounter for therapeutic drug level monitoring: Secondary | ICD-10-CM | POA: Diagnosis not present

## 2023-12-13 DIAGNOSIS — I824Y2 Acute embolism and thrombosis of unspecified deep veins of left proximal lower extremity: Secondary | ICD-10-CM | POA: Diagnosis not present

## 2023-12-13 DIAGNOSIS — I4891 Unspecified atrial fibrillation: Secondary | ICD-10-CM

## 2023-12-13 LAB — POCT INR: INR: 2.8 (ref 2.0–3.0)

## 2023-12-13 NOTE — Patient Instructions (Signed)
 Description   continue 1 tablet daily except 1.5 tablets on  Wednesdays.  Recheck INR in 6 weeks. Coumadin Clinic [phone]

## 2023-12-17 ENCOUNTER — Other Ambulatory Visit (HOSPITAL_COMMUNITY): Payer: Self-pay

## 2023-12-17 DIAGNOSIS — I5022 Chronic systolic (congestive) heart failure: Secondary | ICD-10-CM

## 2023-12-17 MED ORDER — CARVEDILOL 6.25 MG PO TABS: 6.2500 mg | ORAL_TABLET | Freq: Two times a day (BID) | ORAL | 5 refills | Status: DC

## 2023-12-18 ENCOUNTER — Encounter: Payer: Self-pay | Admitting: Internal Medicine

## 2024-01-04 ENCOUNTER — Ambulatory Visit (INDEPENDENT_AMBULATORY_CARE_PROVIDER_SITE_OTHER): Payer: 59

## 2024-01-04 DIAGNOSIS — I255 Ischemic cardiomyopathy: Secondary | ICD-10-CM

## 2024-01-05 LAB — CUP PACEART REMOTE DEVICE CHECK
Battery Remaining Longevity: 162 mo
Battery Remaining Percentage: 100 %
Brady Statistic RV Percent Paced: 0 %
Date Time Interrogation Session: 20250411040100
HighPow Impedance: 71 Ohm
Implantable Lead Connection Status: 753985
Implantable Lead Implant Date: 20220413
Implantable Lead Location: 753860
Implantable Lead Model: 183
Implantable Lead Serial Number: 303520
Implantable Pulse Generator Implant Date: 20220413
Lead Channel Impedance Value: 610 Ohm
Lead Channel Pacing Threshold Amplitude: 0.7 V
Lead Channel Pacing Threshold Pulse Width: 0.4 ms
Lead Channel Setting Pacing Amplitude: 2 V
Lead Channel Setting Pacing Pulse Width: 0.4 ms
Lead Channel Setting Sensing Sensitivity: 0.5 mV
Pulse Gen Serial Number: 212369
Zone Setting Status: 755011

## 2024-01-17 ENCOUNTER — Encounter: Payer: Self-pay | Admitting: Internal Medicine

## 2024-01-23 ENCOUNTER — Other Ambulatory Visit: Payer: Self-pay

## 2024-01-23 ENCOUNTER — Telehealth: Payer: Self-pay | Admitting: Internal Medicine

## 2024-01-23 DIAGNOSIS — I4891 Unspecified atrial fibrillation: Secondary | ICD-10-CM

## 2024-01-23 MED ORDER — WARFARIN SODIUM 2.5 MG PO TABS: ORAL_TABLET | ORAL | 1 refills | Status: DC

## 2024-01-23 NOTE — Telephone Encounter (Signed)
 While on the phone with the patient to reschedule his 5/6 appointment due to Dr. Rodolfo Clan being on medical leave, he states this appointment was for a face to face conversation due to being denied for CCM/Barostim. Suggested that we could reschedule for when we are hopeful he will return, patient instead requested to switch from Dr. Rodolfo Clan to Dr. Marven Slimmer. Patient has been rescheduled to see Dr. Marven Slimmer on 5/16.

## 2024-01-24 ENCOUNTER — Ambulatory Visit: Attending: Cardiology

## 2024-01-24 DIAGNOSIS — I824Y2 Acute embolism and thrombosis of unspecified deep veins of left proximal lower extremity: Secondary | ICD-10-CM | POA: Diagnosis not present

## 2024-01-24 DIAGNOSIS — I4891 Unspecified atrial fibrillation: Secondary | ICD-10-CM

## 2024-01-24 DIAGNOSIS — Z5181 Encounter for therapeutic drug level monitoring: Secondary | ICD-10-CM | POA: Diagnosis not present

## 2024-01-24 LAB — POCT INR: INR: 1.7 — AB (ref 2.0–3.0)

## 2024-01-24 NOTE — Patient Instructions (Signed)
 Description   Take 1.5 tablets today and then continue 1 tablet daily except 1.5 tablets on  Wednesdays.  Recheck INR in 5 weeks. Coumadin  Clinic [phone]

## 2024-01-29 ENCOUNTER — Encounter: Payer: 59 | Admitting: Internal Medicine

## 2024-02-08 ENCOUNTER — Ambulatory Visit: Attending: Cardiology | Admitting: Cardiology

## 2024-02-08 VITALS — BP 106/70 | HR 72 | Ht 68.0 in | Wt 153.0 lb

## 2024-02-08 DIAGNOSIS — I4891 Unspecified atrial fibrillation: Secondary | ICD-10-CM

## 2024-02-08 NOTE — Patient Instructions (Addendum)
 I will contact you with a contact for cash pay information  Medication Instructions:  Your physician recommends that you continue on your current medications as directed. Please refer to the Current Medication list given to you today.  *If you need a refill on your cardiac medications before your next appointment, please call your pharmacy*  Lab Work: None ordered.  You may go to any [facility] Location for your lab work:  [facility] - [address] ([facility]) - [address] [phone] [address]  [place] - [address]   [place]  - [address]   [place] - [address] - [address] [employer]  - [address] ([facility]   If you have labs (blood work) drawn today and your tests are completely normal, you will receive your results only by: [employer] (if you have MyChart)  If you have any lab test that is abnormal or we need to change your treatment, we will call you or send a MyChart message to review the results.  Testing/Procedures: None ordered.  Follow-Up: At [facility], you and your health needs are our priority.  As part of our continuing mission to provide you with exceptional heart care, we have created designated Provider Care Teams.  These Care Teams include your primary Cardiologist (physician) and Advanced Practice Providers (APPs -  Physician Assistants and Nurse Practitioners) who all work together to provide you with the care you need, when you need it.  Your next appointment:   To be scheduled  The format for your next appointment:   In Person  Provider:   Harvie Liner, MD{or one of the following Advanced Practice Providers on your designated Care Team:   Mertha Abrahams, [place] [address] [place], [place] Neda Balk, NP

## 2024-02-08 NOTE — Progress Notes (Signed)
 Electrophysiology Office Follow up Visit Note:    Date:  02/08/2024   ID:  Jerry [name], DOB 11-15-1970, MRN [id number]  PCP:  Perley Bradley, MD  [facility] Cardiologist:  Arnoldo Lapping, MD  [facility] Electrophysiologist:  Boyce Byes, MD    Interval History:     Jerry [name] is a 53 y.o. male who presents for a follow up visit.   The patient is known to Dr. Rodolfo Clan.  The patient has a history of coronary artery disease with prior myocardial infarction in 2021.  This resulted in chronic systolic heart failure complicated by apical thrombus and severe MR.  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  His ICD was implanted in April 2022.  He was previously evaluated for Verastem.  This was denied by his insurance.  CCM was then considered and recommended but this also was denied.  Appeals have also been denied.  At the last appointment with Dr. Mitzie Anda in February the patient reported dyspnea with exertion.  Given his NYHA class III symptoms on maximal medical management, he is being sent back for CCM consideration.  He has previously had this recommended by Dr. Rodolfo Clan.      Past medical, surgical, social and family history were reviewed.  ROS:   Please see the history of present illness.    All other systems reviewed and are negative.  EKGs/Labs/Other Studies Reviewed:    The following studies were reviewed today:  May 10, 2023 echo EF 25% RV normal Mitral valve repair   Feb 08, 2024 in-clinic device interrogation personally reviewed Presenting rhythm 75V sensed Battery longevity 13 years Lead parameter stable No high-voltage therapies Heart logic normal  May 17, 2021 chest x-ray personally reviewed Single-chamber, single coil ICD in the left prepectoral position   EKG Interpretation Date/Time:  Friday Feb 08 2024 15:47:46 EDT Ventricular Rate:  72 PR Interval:  168 QRS Duration:  102 QT  Interval:  374 QTC Calculation: 409 R Axis:   -17  Text Interpretation: Normal sinus rhythm Confirmed by Harvie Liner [phone]) on 02/08/2024 3:51:48 PM    Physical Exam:    VS:  BP 106/70   Pulse 72   Ht 5\' 8"  (1.727 m)   Wt 153 lb (69.4 kg)   BMI 23.26 kg/m     Wt Readings from Last 3 Encounters:  02/08/24 153 lb (69.4 kg)  11/23/23 156 lb (70.8 kg)  11/08/23 155 lb 12.8 oz (70.7 kg)     GEN: no distress CARD: RRR, No MRG.  CIED incision well-healed RESP: No IWOB. CTAB.      ASSESSMENT:    1. Atrial fibrillation, unspecified type (HCC)    PLAN:    In order of problems listed above:  #Chronic systolic heart failure NYHA class III symptoms on maximal medical therapy I think he would be a good candidate for CCM therapy.  Risks, benefits, alternatives to CCM implantation were discussed in detail with the patient today. The patient understands that the risks include but are not limited to bleeding, infection, pneumothorax, perforation, tamponade, vascular damage, renal failure, MI, stroke, death, and lead dislodgement and wishes to proceed.  We will therefore schedule device implantation at the next available time.  Given the patient's history of apical thrombus, target INR 1.5-2 at the time of implant.  #ICD in situ Device functioning appropriately.  Continue remote monitoring.   Signed, Harvie Liner, MD, [facility], [facility] 02/08/2024 3:55 PM    Electrophysiology  [facility]

## 2024-02-11 ENCOUNTER — Encounter: Payer: Self-pay | Admitting: Cardiology

## 2024-02-11 NOTE — Telephone Encounter (Signed)
**Note De-identified  [place] Obfuscation** Please advise 

## 2024-02-11 NOTE — Addendum Note (Signed)
 Addended by: Lott Rouleau A on: 02/11/2024 08:28 AM   Modules accepted: Orders

## 2024-02-11 NOTE — Progress Notes (Signed)
 Remote ICD transmission.

## 2024-02-13 ENCOUNTER — Telehealth: Payer: Self-pay | Admitting: Cardiology

## 2024-02-13 DIAGNOSIS — I4891 Unspecified atrial fibrillation: Secondary | ICD-10-CM

## 2024-02-13 DIAGNOSIS — I255 Ischemic cardiomyopathy: Secondary | ICD-10-CM

## 2024-02-13 NOTE — Telephone Encounter (Signed)
 Spoke with Pt. Stated we would give him a call back once I had heard back. Pt stated understanding.

## 2024-02-13 NOTE — Telephone Encounter (Signed)
 Patient stated he is following-up on the pricing for CCM surgery as he wants to pay for this himself.  Patient wants a call back directly from RN Stevie or Dr. Marven Slimmer.

## 2024-02-15 NOTE — Telephone Encounter (Signed)
 Patient is returning call regarding arranging CCM.

## 2024-02-15 NOTE — Telephone Encounter (Signed)
Left message to check MyChart.

## 2024-02-20 ENCOUNTER — Encounter (HOSPITAL_COMMUNITY): Payer: 59

## 2024-02-21 ENCOUNTER — Telehealth: Payer: Self-pay | Admitting: *Deleted

## 2024-02-21 NOTE — Addendum Note (Signed)
 Addended by: CHAUVIGNE, Elynn Patteson on: 02/21/2024 03:40 PM   Modules accepted: Orders

## 2024-02-21 NOTE — Telephone Encounter (Signed)
 Chauvigne, Bobby Burr, RN  P Cv Div Heartcare Pre Cert/Auth; P Cvd-Ep Scheduling; P Cv Div Anticoagulation Patient is scheduled for a CCM implant with Dr. Marven Slimmer on 6/17.  Patient is on coumadin  and Dr. Marven Slimmer would like his INR to be between 1.5-2.0.  Routine follow up.  Thank you! Carly

## 2024-02-21 NOTE — Telephone Encounter (Signed)
 Spoke with the patient and scheduled him for a CCM implant on 6/17. Patient aware that instructions will be sent through MyChart.

## 2024-02-27 ENCOUNTER — Ambulatory Visit: Admitting: Cardiology

## 2024-02-27 LAB — CUP PACEART INCLINIC DEVICE CHECK
Brady Statistic RV Percent Paced: 1 % — CL
Date Time Interrogation Session: 20250516204721
HighPow Impedance: 77 Ohm
Implantable Lead Connection Status: 753985
Implantable Lead Implant Date: 20220413
Implantable Lead Location: 753860
Implantable Lead Model: 183
Implantable Lead Serial Number: 303520
Implantable Pulse Generator Implant Date: 20220413
Lead Channel Impedance Value: 634 Ohm
Lead Channel Pacing Threshold Amplitude: 0.9 V
Lead Channel Pacing Threshold Pulse Width: 0.4 ms
Lead Channel Sensing Intrinsic Amplitude: 25 mV
Lead Channel Setting Pacing Amplitude: 2 V
Lead Channel Setting Pacing Pulse Width: 0.4 ms
Pulse Gen Serial Number: 212369

## 2024-02-28 ENCOUNTER — Ambulatory Visit: Attending: Cardiology | Admitting: *Deleted

## 2024-02-28 ENCOUNTER — Encounter

## 2024-02-28 DIAGNOSIS — I4891 Unspecified atrial fibrillation: Secondary | ICD-10-CM

## 2024-02-28 DIAGNOSIS — Z5181 Encounter for therapeutic drug level monitoring: Secondary | ICD-10-CM | POA: Diagnosis not present

## 2024-02-28 DIAGNOSIS — I824Y2 Acute embolism and thrombosis of unspecified deep veins of left proximal lower extremity: Secondary | ICD-10-CM | POA: Diagnosis not present

## 2024-02-28 LAB — POCT INR: POC INR: 2.1

## 2024-02-28 NOTE — Patient Instructions (Signed)
 Description   Continue 1 tablet daily except 1.5 tablets on  Wednesdays.  Pt's INR goal needs to be 1.5-2.0 for procedure on 6/17.  Recheck INR in 1 week. Coumadin  Clinic [phone]

## 2024-02-29 ENCOUNTER — Ambulatory Visit: Payer: Self-pay | Admitting: Cardiology

## 2024-03-01 LAB — CBC WITH DIFFERENTIAL/PLATELET
Basophils Absolute: 0.1 10*3/uL (ref 0.0–0.2)
Basos: 1 %
EOS (ABSOLUTE): 0.5 10*3/uL — ABNORMAL HIGH (ref 0.0–0.4)
Eos: 4 %
Hematocrit: 42.5 % (ref 37.5–51.0)
Hemoglobin: 14.2 g/dL (ref 13.0–17.7)
Immature Grans (Abs): 0 10*3/uL (ref 0.0–0.1)
Immature Granulocytes: 0 %
Lymphocytes Absolute: 2.9 10*3/uL (ref 0.7–3.1)
Lymphs: 26 %
MCH: 31.5 pg (ref 26.6–33.0)
MCHC: 33.4 g/dL (ref 31.5–35.7)
MCV: 94 fL (ref 79–97)
Monocytes Absolute: 0.8 10*3/uL (ref 0.1–0.9)
Monocytes: 7 %
Neutrophils Absolute: 6.9 10*3/uL (ref 1.4–7.0)
Neutrophils: 62 %
Platelets: 162 10*3/uL (ref 150–450)
RBC: 4.51 x10E6/uL (ref 4.14–5.80)
RDW: 12.3 % (ref 11.6–15.4)
WBC: 11.2 10*3/uL — ABNORMAL HIGH (ref 3.4–10.8)

## 2024-03-01 LAB — BASIC METABOLIC PANEL WITH GFR
BUN/Creatinine Ratio: 27 — ABNORMAL HIGH (ref 9–20)
BUN: 54 mg/dL — ABNORMAL HIGH (ref 6–24)
CO2: 21 mmol/L (ref 20–29)
Calcium: 9.5 mg/dL (ref 8.7–10.2)
Chloride: 96 mmol/L (ref 96–106)
Creatinine, Ser: 1.97 mg/dL — ABNORMAL HIGH (ref 0.76–1.27)
Glucose: 80 mg/dL (ref 70–99)
Potassium: 4.7 mmol/L (ref 3.5–5.2)
Sodium: 137 mmol/L (ref 134–144)
eGFR: 40 mL/min/{1.73_m2} — ABNORMAL LOW (ref >59)

## 2024-03-04 ENCOUNTER — Telehealth (HOSPITAL_COMMUNITY): Payer: Self-pay

## 2024-03-04 NOTE — Telephone Encounter (Signed)
 Spoke with patient to discuss upcoming procedure.   Confirmed patient is scheduled for a Cardiac Contractility Modulation Device implant  on Tuesday, June 17 with Dr. Harvie Liner. Instructed patient to arrive at the Main Entrance A at [facility]: [address] [place], [place] [address] and check in at Admitting at 12:00 PM.   Labs completed  Any recent signs of acute illness or been started on antibiotics? No Any new medications started? No Any medications to hold? Hold Jardiance  for 3 days prior to your procedure- last dose on June 13th. Hold Torsemide  and Spironolactone  on the morning of your procedure. Follow the instructions regarding taking your coumadin  from the coumadin  clinic.    Medication instructions:  On the morning of your procedure you may take all other medications not discussed with a sip of water . No eating or drinking after midnight prior to procedure.   The night before your procedure and the morning of your procedure, wash thoroughly with the CHG surgical soap from the neck down, paying special attention to the area where your procedure will be performed.  Advised of plan to go home the same day and will only stay overnight if medically necessary. You MUST have a responsible adult to drive you home and MUST be with you the first 24 hours after you arrive home.  Patient verbalized understanding to all instructions provided and agreed to proceed with procedure.

## 2024-03-05 NOTE — Progress Notes (Signed)
 Advanced Heart Failure Clinic Note  PCP: Perley Bradley, MD HF Cardiology: Dr. Mitzie Anda  CT Surgery: Dr. Luna Salinas   53 y.o. male s/p back surgery 05/2020 was admitted for acute inferior and anterolateral MI on 06/25/20. LHC showed thrombotic occlusion of distal RCA that was treated with PTCA and thrombectomy.  He was also noted to have chronic total occlusion of the LAD with collaterals from the right (thus this territory was affected by the RCA MI) as well as 90% complex proximal LCx stenosis. EF 25-30% + apical thrombus and severe mitral regurgitation. RV normal. Also w/ post MI pericarditis, treated w/ colchicine . Course further c/b cardiogenic shock and acute hypoxic respiratory failure w/ component of septic shock 2/2 PNA, requiring intubation and treatment w/ abx. Once stabilized, he underwent CABG x 3 (LIMA-LAD, Lt radial-OM1 and sequential SVG-PDA/PLA) + MV repair and placement of Impella 5.5 on 10/13 by Dr. Luna Salinas.   Post operatively, he developed tamponade and was taken back to the OR on 10/14 with large hematoma obstructing right atrium. Impella removal 10/21 w/ stable hemodynamics and able to tolerate initiation of GDMT.  Also had post-operative afib treated w/ amiodarone . He was discharged home on 07/21/20.   Echo in 3/22 showed EF 20-25% with diffuse hypokinesis, mildly decreased RV systolic function, no LV thrombus, normal IVC, s/p MV repair with trivial MR and mild stenosis (mean gradient 4 mmHg).  [employer] ICD placed in 4/22.  Later in 4/22, he had L4-L5 spinal fusion.   CPX in 10/22 showed mild-moderate HF limitation. Echo 4/23 EF 25-30%, no LV thrombus, normal RV.  Echo in 8/24 showed EF 25% with mild mitral regurgitation s/p MV repair, normal RV. CPX in 9/24 showed a mild to moderate HF limitation.   He has been seen by Dr Charlotte Cookey and was deemed a candidate for Barostim Therapy. Denied by insurance.  We next tried to get him a cardiac contractility modulator.  He was  also denied for this by his insurance.  Appeals for both devices were denied.   Today he returns for HF follow up. Overall feeling terrible.  He has SOB walking short distances and with ADLs, struggling doing activities with his kids. He remains fatigued. He still works full time as a Curator. Denies palpitations, abnormal bleeding, CP, dizziness, edema, or PND. He has orthopnea. Appetite ok. Weight at home 157-158 pounds. Taking all medications, takes torsemide  10 mg qod. Approved for CCM next month with Dr. Marven Slimmer. He is hopeful this will improve his symptoms.  ReDs reading: 28 %, normal  Geographical information systems officer (personally reviewed): HeartLogic 1, average HR 78 bpm, 2.8 hr/day activity, no VT  Labs (11/24): LDL 47, TGs 86 Labs (4/25): Pro-BNP 536, K 4.7, creatinine 1.29.  Labs (2/25): LDL 53 Labs (6/25): K 4.7, creatinine 1.97  Review of systems complete and found to be negative unless listed in HPI.    PMH: 1. Anxiety 2. Hyperlipidemia 3. CAD: Inferior and anterolateral MI 10/21.  LHC showed thrombotic occlusion distal RCA treated with PTCA/thrombectomy, CTO LAD with R=>L collaterals, 90% complex proximal LCx stenosis.  - CABG x 4 in 10/21 with LIMA-LAD, left radial-OM1, sequential SVG-PDA/PLV.  4. Mitral regurgitation: Suspect infarct-related MR.   - Mitral valve repair in 10/21 with CABG.  5. LV thrombus  6. Chronic systolic CHF: ischemic cardiomyopathy.  [employer] ICD.  Echo (10/21) with EF 25-30%, apical thrombus, severe MR, normal RV.   - Echo (3/22): EF 20-25% with diffuse hypokinesis, mildly decreased RV systolic  function, no LV thrombus, normal IVC, s/p MV repair with trivial MR and mild stenosis (mean gradient 4 mmHg).   - CPX (10/22): RER 1.17, VE/VCO2 slope 31, peak VO2 22.7 => mild-moderate HF limitation.  - Echo (4/23): EF 25-30%, no LV thrombus, normal RV. - Echo (8/24): EF 25% with mild mitral regurgitation s/p MV repair, normal RV.  - CPX  (9/24): peak VO2 24, VE/VCO2 slope 33, RER 1.09 7. Post-infarct pericarditis in 10/21. 8. Atrial fibrillation: Post-op in 10/21.  9. H/o back surgery 9/21: Discectomy.   10. CKD stage 3  Current Outpatient Medications  Medication Sig Dispense Refill   ALPRAZolam  (XANAX ) 1 MG tablet Take 1 mg by mouth at bedtime.     atorvastatin  (LIPITOR ) 80 MG tablet Take 1 tablet (80 mg total) by mouth daily. 90 tablet 3   carvedilol  (COREG ) 6.25 MG tablet Take 1 tablet (6.25 mg total) by mouth 2 (two) times daily with a meal. 60 tablet 5   digoxin  (LANOXIN ) 0.125 MG tablet Take 0.5 tablets (0.0625 mg total) by mouth daily. 45 tablet 3   ENTRESTO  49-51 MG TAKE 1 TABLET BY MOUTH TWICE DAILY 60 tablet 11   icosapent  Ethyl (VASCEPA ) 1 g capsule Take 2 capsules (2 g total) by mouth 2 (two) times daily. 120 capsule 11   JARDIANCE  10 MG TABS tablet TAKE 1 TABLET(10 MG) BY MOUTH DAILY BEFORE BREAKFAST (Patient taking differently: Take 10 mg by mouth daily.) 30 tablet 11   Multiple Vitamin (MULTIVITAMIN WITH MINERALS) TABS tablet Take 1 tablet by mouth in the morning.     spironolactone  (ALDACTONE ) 25 MG tablet TAKE 1 TABLET(25 MG) BY MOUTH AT BEDTIME 90 tablet 3   torsemide  (DEMADEX ) 20 MG tablet Take 1 tablet (20 mg total) by mouth every other day. (Patient taking differently: Take 10 mg by mouth every other day.)     traZODone  (DESYREL ) 100 MG tablet Take 100 mg by mouth at bedtime.     warfarin (COUMADIN ) 2.5 MG tablet TAKE 1 TO 1 AND 1/2 TABLETS BY MOUTH DAILY AS DIRECTED BY COUMADIN  CLINIC 100 tablet 1   No current facility-administered medications for this encounter.   Allergies  Allergen Reactions   Fenofibrate  Other (See Comments)    GI upset, Joint pain    Social History   Socioeconomic History   Marital status: Single    Spouse name: Not on file   Number of children: Not on file   Years of education: Not on file   Highest education level: Not on file  Occupational History    Employer: [employer]  Tobacco Use   Smoking status: Never   Smokeless tobacco: Never  Vaping Use   Vaping status: Never Used  Substance and Sexual Activity   Alcohol use: Yes    Alcohol/week: 1.0 standard drink of alcohol    Types: 1 Standard drinks or equivalent per week   Drug use: No   Sexual activity: Yes    Birth control/protection: None  Other Topics Concern   Not on file  Social History Narrative   Not on file   Social Drivers of Health   Financial Resource Strain: Not on file  Food Insecurity: Not on file  Transportation Needs: Not on file  Physical Activity: Not on file  Stress: Not on file  Social Connections: Unknown (02/06/2022)   [facility]   Social Network    Social Network: Not on file  Intimate Partner Violence: Unknown (12/29/2021)  Received from [facility]   HITS    Physically Hurt: Not on file    Insult or Talk Down To: Not on file    Threaten Physical Harm: Not on file    Scream or Curse: Not on file   FH: No family history of premature CAD.   BP 102/70   Pulse 63   Wt 71.1 kg (156 lb 12.8 oz)   SpO2 97%   BMI 23.84 kg/m   Wt Readings from Last 3 Encounters:  03/07/24 71.1 kg (156 lb 12.8 oz)  02/08/24 69.4 kg (153 lb)  11/23/23 70.8 kg (156 lb)   PHYSICAL EXAM: General:  NAD. No resp difficulty, walked into clinic HEENT: Normal Neck: Supple. No JVD. Cor: Regular rate & rhythm. No rubs, gallops or murmurs. Lungs: Clear Abdomen: Soft, nontender, nondistended.  Extremities: No cyanosis, clubbing, rash, edema Neuro: Alert & oriented x 3, moves all 4 extremities w/o difficulty. Affect pleasant.  ASSESSMENT & PLAN: 1. CAD: Late presentation inferior and anterolateral MI 10/21.  Cath showed thrombotic occlusion distal RCA, CTO LAD with collaterals from right, and complex 90% proximal LCx stenosis.  He had PTCA/thrombectomy RCA with good flow at end of procedure.  Suspect he had damage to LAD territory as well as RCA territory as  RCA collateralized the LAD.  S/p CABG w/ impella support with LIMA-LAD, left radial to OM, seq SVG-PDA/PLV. Post op course c/b tamponade and was taken back to the OR on 10/14 for drainage, found to have large hematoma obstructing right atrium.  No chest pain - no ASA given need for coumadin   - Continue atorvastatin  80 mg daily. Check lipids today.  2. 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 Echo in 10/21 with EF 25-30%.  Echo in 3/22 showed EF 20-25%, diffuse hypokinesis, mildly decreased RV systolic function.  He now has a Environmental manager ICD.  CPX in 10/22 with mild-moderate HF limitation.  Echo 4/23 showed EF 25-30%, no LV thrombus, normal RV.  Echo in 8/24 showed that EF remains low at 25%, s/p MV repair with mild MR, RV normal. CPX in 9/24 showed mild to moderate HF limitation.  Patient is not volume overloaded by exam or HeartLogic, ReDs 28%. Remains NYHA class III symptoms. - Continue Entresto  49/51 bid. No BP room to increase.  - Continue spironolactone  25 mg qhs. - Continue Coreg  6.25 mg bid. Titration limited by fatigue and low BP  - Continue digoxin  0.0625 mg daily. Check digoxin  level.  - Continue Jardiance  10 mg daily. No GU symptoms. - Continue torsemide  10 mg daily. BMET and BNP today. - Narrow QRS so not CRT candidate.   - Planning CCM next week with Dr. Marven Slimmer. Hopeful this will help with his HF symptoms. - Consider repeat CPX after CCM. 3. Severe MR: Suspect infarct-related MR. s/p MV repair at time of CABG 10/21.  Mild MR on 4/23 echo. Mild on 8/24 echo.  4. LV thrombus:  - Continue warfarin indefinitely given ongoing low EF. Check INR today and will forward to Coumadin  Clinic 5. Atrial fibrillation: Post-CABG. Regular on exam today. - Continue warfarin. Check iron panel today. 6. Hypertriglyceridemia: Unable to tolerate fenofibrate  due to GI upset. Now on Vascepa .   - Good lipids 2/25.  Follow up in 4 months with Dr.  Mertie Abt, FNP 03/07/24

## 2024-03-07 ENCOUNTER — Encounter: Payer: Self-pay | Admitting: Emergency Medicine

## 2024-03-07 ENCOUNTER — Encounter (HOSPITAL_COMMUNITY): Payer: Self-pay

## 2024-03-07 ENCOUNTER — Ambulatory Visit (HOSPITAL_COMMUNITY)
Admission: RE | Admit: 2024-03-07 | Discharge: 2024-03-07 | Disposition: A | Discharge status: Home / Self Care | Source: Ambulatory Visit | Attending: Family Medicine

## 2024-03-07 DIAGNOSIS — N183 Chronic kidney disease, stage 3 unspecified: Secondary | ICD-10-CM | POA: Insufficient documentation

## 2024-03-07 DIAGNOSIS — K30 Functional dyspepsia: Secondary | ICD-10-CM | POA: Insufficient documentation

## 2024-03-07 DIAGNOSIS — Z79899 Other long term (current) drug therapy: Secondary | ICD-10-CM | POA: Insufficient documentation

## 2024-03-07 DIAGNOSIS — I5022 Chronic systolic (congestive) heart failure: Secondary | ICD-10-CM | POA: Diagnosis not present

## 2024-03-07 DIAGNOSIS — I236 Thrombosis of atrium, auricular appendage, and ventricle as current complications following acute myocardial infarction: Secondary | ICD-10-CM

## 2024-03-07 DIAGNOSIS — Z951 Presence of aortocoronary bypass graft: Secondary | ICD-10-CM | POA: Insufficient documentation

## 2024-03-07 DIAGNOSIS — Z9889 Other specified postprocedural states: Secondary | ICD-10-CM

## 2024-03-07 DIAGNOSIS — E781 Pure hyperglyceridemia: Secondary | ICD-10-CM | POA: Diagnosis not present

## 2024-03-07 DIAGNOSIS — Z7984 Long term (current) use of oral hypoglycemic drugs: Secondary | ICD-10-CM | POA: Diagnosis not present

## 2024-03-07 DIAGNOSIS — Z7901 Long term (current) use of anticoagulants: Secondary | ICD-10-CM | POA: Insufficient documentation

## 2024-03-07 DIAGNOSIS — I255 Ischemic cardiomyopathy: Secondary | ICD-10-CM | POA: Diagnosis not present

## 2024-03-07 DIAGNOSIS — I252 Old myocardial infarction: Secondary | ICD-10-CM | POA: Insufficient documentation

## 2024-03-07 DIAGNOSIS — Z95810 Presence of automatic (implantable) cardiac defibrillator: Secondary | ICD-10-CM | POA: Diagnosis not present

## 2024-03-07 DIAGNOSIS — I480 Paroxysmal atrial fibrillation: Secondary | ICD-10-CM

## 2024-03-07 DIAGNOSIS — I2510 Atherosclerotic heart disease of native coronary artery without angina pectoris: Secondary | ICD-10-CM | POA: Diagnosis present

## 2024-03-07 DIAGNOSIS — E785 Hyperlipidemia, unspecified: Secondary | ICD-10-CM

## 2024-03-07 LAB — BASIC METABOLIC PANEL WITH GFR
Anion gap: 9 (ref 5–15)
BUN: 37 mg/dL — ABNORMAL HIGH (ref 6–20)
CO2: 27 mmol/L (ref 22–32)
Calcium: 9.9 mg/dL (ref 8.9–10.3)
Chloride: 102 mmol/L (ref 98–111)
Creatinine, Ser: 1.61 mg/dL — ABNORMAL HIGH (ref 0.61–1.24)
GFR, Estimated: 51 mL/min — ABNORMAL LOW (ref >60)
Glucose, Bld: 106 mg/dL — ABNORMAL HIGH (ref 70–99)
Potassium: 5.1 mmol/L (ref 3.5–5.1)
Sodium: 138 mmol/L (ref 135–145)

## 2024-03-07 LAB — IRON AND TIBC
Iron: 104 ug/dL (ref 45–182)
Saturation Ratios: 27 % (ref 17.9–39.5)
TIBC: 389 ug/dL (ref 250–450)
UIBC: 285 ug/dL

## 2024-03-07 LAB — DIGOXIN LEVEL: Digoxin Level: 0.4 ng/mL — ABNORMAL LOW (ref 0.8–2.0)

## 2024-03-07 LAB — PROTIME-INR
INR: 1.4 — ABNORMAL HIGH (ref 0.8–1.2)
Prothrombin Time: 17.2 s — ABNORMAL HIGH (ref 11.4–15.2)

## 2024-03-07 LAB — FERRITIN: Ferritin: 88 ng/mL (ref 24–336)

## 2024-03-07 LAB — BRAIN NATRIURETIC PEPTIDE: B Natriuretic Peptide: 204.7 pg/mL — ABNORMAL HIGH (ref 0.0–100.0)

## 2024-03-07 NOTE — Progress Notes (Signed)
 ReDS Vest / Clip - 03/07/24 1600       ReDS Vest / Clip   Station Marker C    Ruler Value 28    ReDS Value Range Low volume    ReDS Actual Value 28

## 2024-03-07 NOTE — Patient Instructions (Addendum)
 Good to see you today!  Labs done today, your results will be available in MyChart, we will contact you for abnormal readings.  Your physician recommends that you schedule a follow-up appointment 4 months(October) call office in August to schedule an appointment  If you have any questions or concerns before your next appointment please send us  a message through [place] or call our office at [phone].    TO LEAVE A MESSAGE FOR THE NURSE SELECT OPTION 2, PLEASE LEAVE A MESSAGE INCLUDING: YOUR NAME DATE OF BIRTH CALL BACK NUMBER REASON FOR CALL**this is important as we prioritize the call backs  YOU WILL RECEIVE A CALL BACK THE SAME DAY AS LONG AS YOU CALL BEFORE 4:00 PM At the Advanced Heart Failure Clinic, you and your health needs are our priority. As part of our continuing mission to provide you with exceptional heart care, we have created designated Provider Care Teams. These Care Teams include your primary Cardiologist (physician) and Advanced Practice Providers (APPs- Physician Assistants and Nurse Practitioners) who all work together to provide you with the care you need, when you need it.   You may see any of the following providers on your designated Care Team at your next follow up: Dr Jules Oar Dr Peder Bourdon Dr. Alwin Baars Dr. Arta Lark Amy Marijane Shoulders, NP Ruddy Corral, [place] [facility] [place], [place] Dennise Fitz, NP [place] Lee, NP Shawnee Dellen, NP Luster Salters, PharmD Bevely Brush, PharmD   Please be sure to bring in all your medications bottles to every appointment.    Thank you for choosing [facility]

## 2024-03-07 NOTE — Addendum Note (Signed)
 Encounter addended by: Dewey Fordyce, CMA on: 03/07/2024 4:19 PM  Actions taken: Visit diagnoses modified, Order list changed, Diagnosis association updated, Flowsheet accepted, Clinical Note Signed, Charge Capture section accepted

## 2024-03-10 ENCOUNTER — Ambulatory Visit (INDEPENDENT_AMBULATORY_CARE_PROVIDER_SITE_OTHER): Admitting: Cardiovascular Disease

## 2024-03-10 ENCOUNTER — Other Ambulatory Visit (HOSPITAL_COMMUNITY): Payer: Self-pay | Admitting: Cardiology

## 2024-03-10 ENCOUNTER — Ambulatory Visit (HOSPITAL_COMMUNITY): Payer: Self-pay | Admitting: Cardiology

## 2024-03-10 DIAGNOSIS — Z5181 Encounter for therapeutic drug level monitoring: Secondary | ICD-10-CM

## 2024-03-10 MED ORDER — TORSEMIDE 20 MG PO TABS: 10.0000 mg | ORAL_TABLET | Freq: Every day | ORAL | 3 refills | Status: AC

## 2024-03-10 NOTE — Pre-Procedure Instructions (Signed)
 Instructed patient on the following items: Arrival time 1200 Nothing to eat or drink after midnight No meds AM of procedure Responsible person to drive you home and stay with you for 24 hrs Wash with special soap night before and morning of procedure If on anti-coagulant drug instructions Coumadin  per Coumadin  Clinic instructions.

## 2024-03-11 ENCOUNTER — Other Ambulatory Visit: Payer: Self-pay

## 2024-03-11 ENCOUNTER — Ambulatory Visit (HOSPITAL_COMMUNITY)
Admission: RE | Admit: 2024-03-11 | Discharge: 2024-03-11 | Disposition: A | Discharge status: Home / Self Care | Attending: Cardiology | Admitting: Cardiology

## 2024-03-11 ENCOUNTER — Ambulatory Visit (HOSPITAL_COMMUNITY)

## 2024-03-11 ENCOUNTER — Encounter (HOSPITAL_COMMUNITY)
Admission: RE | Disposition: A | Discharge status: Home / Self Care | Payer: Self-pay | Source: Home / Self Care | Attending: Cardiology

## 2024-03-11 DIAGNOSIS — I2510 Atherosclerotic heart disease of native coronary artery without angina pectoris: Secondary | ICD-10-CM | POA: Diagnosis not present

## 2024-03-11 DIAGNOSIS — I5022 Chronic systolic (congestive) heart failure: Secondary | ICD-10-CM

## 2024-03-11 DIAGNOSIS — Z95810 Presence of automatic (implantable) cardiac defibrillator: Secondary | ICD-10-CM | POA: Insufficient documentation

## 2024-03-11 DIAGNOSIS — I252 Old myocardial infarction: Secondary | ICD-10-CM | POA: Diagnosis not present

## 2024-03-11 DIAGNOSIS — I4891 Unspecified atrial fibrillation: Secondary | ICD-10-CM | POA: Diagnosis not present

## 2024-03-11 HISTORY — PX: CCM GENERATOR AND A/V LEAD INSERTION: EP1261

## 2024-03-11 LAB — PROTIME-INR
INR: 1.3 — ABNORMAL HIGH (ref 0.8–1.2)
Prothrombin Time: 16.3 s — ABNORMAL HIGH (ref 11.4–15.2)

## 2024-03-11 SURGERY — CCM GENERATOR AND A/V LEAD INSERTION
Anesthesia: LOCAL

## 2024-03-11 MED ORDER — CEFAZOLIN SODIUM-DEXTROSE 2-4 GM/100ML-% IV SOLN
INTRAVENOUS | Status: AC
  Administered 2024-03-11: 2 g via INTRAVENOUS
  Filled 2024-03-11: qty 100

## 2024-03-11 MED ORDER — FENTANYL CITRATE (PF) 100 MCG/2ML IJ SOLN
INTRAMUSCULAR | Status: AC
Start: 2024-03-11 — End: 2024-03-11
  Filled 2024-03-11: qty 2

## 2024-03-11 MED ORDER — SODIUM CHLORIDE 0.9 % IV SOLN: 80.0000 mg | INTRAVENOUS | Status: AC

## 2024-03-11 MED ORDER — CEFAZOLIN SODIUM-DEXTROSE 2-4 GM/100ML-% IV SOLN: 2.0000 g | INTRAVENOUS | Status: AC

## 2024-03-11 MED ORDER — LIDOCAINE HCL (PF) 1 % IJ SOLN
INTRAMUSCULAR | Status: AC
  Filled 2024-03-11: qty 30

## 2024-03-11 MED ORDER — SODIUM CHLORIDE 0.9 % IV SOLN: INTRAVENOUS | Status: DC

## 2024-03-11 MED ORDER — LIDOCAINE HCL (PF) 1 % IJ SOLN
INTRAMUSCULAR | Status: DC | PRN
  Administered 2024-03-11: 60 mL

## 2024-03-11 MED ORDER — ACETAMINOPHEN 325 MG PO TABS: 325.0000 mg | ORAL_TABLET | ORAL | Status: DC | PRN

## 2024-03-11 MED ORDER — CHLORHEXIDINE GLUCONATE 4 % EX SOLN
4.0000 | Freq: Once | CUTANEOUS | Status: DC
  Filled 2024-03-11: qty 60

## 2024-03-11 MED ORDER — HEPARIN (PORCINE) IN NACL 1000-0.9 UT/500ML-% IV SOLN
INTRAVENOUS | Status: DC | PRN
  Administered 2024-03-11: 500 mL

## 2024-03-11 MED ORDER — SODIUM CHLORIDE 0.9 % IV SOLN
INTRAVENOUS | Status: AC
  Administered 2024-03-11: 80 mg
  Filled 2024-03-11: qty 2

## 2024-03-11 MED ORDER — POVIDONE-IODINE 10 % EX SWAB: 2.0000 | Freq: Once | CUTANEOUS | Status: DC

## 2024-03-11 MED ORDER — WARFARIN SODIUM 2.5 MG PO TABS: ORAL_TABLET | ORAL | 1 refills | Status: DC

## 2024-03-11 MED ORDER — MIDAZOLAM HCL 5 MG/5ML IJ SOLN
INTRAMUSCULAR | Status: AC
  Filled 2024-03-11: qty 5

## 2024-03-11 MED ORDER — MIDAZOLAM HCL 5 MG/5ML IJ SOLN
INTRAMUSCULAR | Status: DC | PRN
  Administered 2024-03-11 (×2): .5 mg via INTRAVENOUS

## 2024-03-11 MED ORDER — ONDANSETRON HCL 4 MG/2ML IJ SOLN: 4.0000 mg | Freq: Four times a day (QID) | INTRAMUSCULAR | Status: DC | PRN

## 2024-03-11 SURGICAL SUPPLY — 7 items
CABLE SURGICAL S-101-97-12 (CABLE) ×1 IMPLANT
GENERATOR PLS OPTMZR SMRT MINI (Generator) IMPLANT
LEAD INGEVITY 7841 52 (Lead) IMPLANT
PAD DEFIB RADIO PHYSIO CONN (PAD) ×1 IMPLANT
SHEATH 7FR PRELUDE SNAP 13 (SHEATH) IMPLANT
SHEATH PROBE COVER 6X72 (BAG) IMPLANT
TRAY PACEMAKER INSERTION (PACKS) ×1 IMPLANT

## 2024-03-11 NOTE — Discharge Instructions (Addendum)
 After Your Cardiac Device   You have a [employer] Cardiac Device  ACTIVITY Do not lift your arm above shoulder height for 1 week after your procedure. After 7 days, you may progress as below.  You should remove your sling 24 hours after your procedure, unless otherwise instructed by your provider.     Tuesday March 18, 2024  Wednesday March 19, 2024 Thursday March 20, 2024 Friday March 21, 2024   Do not lift, push, pull, or carry anything over 10 pounds with the affected arm until 6 weeks (Tuesday April 22, 2024 ) after your procedure.   You may drive AFTER your wound check, unless you have been told otherwise by your provider.   Ask your healthcare provider when you can go back to work   INCISION/Dressing If you are on a blood thinner such as Coumadin , Xarelto, Eliquis, Plavix, or Pradaxa please confirm with your provider when this should be resumed.   If large square, outer bandage is left in place, this can be removed after 24 hours from your procedure. Do not remove steri-strips or glue as below.   If a PRESSURE DRESSING (a bulky dressing that usually goes up over your shoulder) was applied or left in place, please follow instructions given by your provider on when to return to have this removed.   Monitor your cardiac device site for redness, swelling, and drainage. Call the device clinic at [phone] if you experience these symptoms or fever/chills.  If your incision is sealed with Steri-strips or staples, you may shower 7 days after your procedure or when told by your provider. Do not remove the steri-strips or let the shower hit directly on your site. You may wash around your site with soap and water .    If you were discharged in a sling, please do not wear this during the day more than 48 hours after your surgery unless otherwise instructed. This may increase the risk of stiffness and soreness in your shoulder.   Avoid lotions, ointments, or perfumes over your incision  until it is well-healed.  You may use a hot tub or a pool AFTER your wound check appointment if the incision is completely closed.   DEVICE MANAGEMENT You should receive your ID card for your new device in 4-8 weeks. Keep this card with you at all times once received. Consider wearing a medical alert bracelet or necklace.  Please follow manufacture instructions for keeping your device charged carefully. This should be performed every 2 weeks at the very least.   Your cardiac device may be MRI compatible. This will be discussed at your next office visit/wound check.  You should avoid contact with strong electric or magnetic fields.   Do not use amateur (ham) radio equipment or electric (arc) welding torches. MP3 player headphones with magnets should not be used. Some devices are safe to use if held at least 12 inches (30 cm) from your cardiac device. These include power tools, lawn mowers, and speakers. If you are unsure if something is safe to use, ask your health care provider.  When using your cell phone, hold it to the ear that is on the opposite side from the cardiac device. Do not leave your cell phone in a pocket over the cardiac device.  You may safely use electric blankets, heating pads, computers, and microwave ovens.  Call the office right away if: You have chest pain. You feel more short of breath than you have felt before. You feel more  light-headed than you have felt before. Your incision starts to open up.  This information is not intended to replace advice given to you by your health care provider. Make sure you discuss any questions you have with your health care provider.

## 2024-03-11 NOTE — Progress Notes (Signed)
 Notified MD lambert of cxr results. Pt okay to go home per Dr. Marven Slimmer

## 2024-03-11 NOTE — H&P (Signed)
 Electrophysiology Office Follow up Visit Note:     Date:  03/11/2024    ID:  Jerry [name], DOB [birth year], MRN [id number]   PCP:  Perley Bradley, MD             [facility] Cardiologist:  Arnoldo Lapping, MD  [facility] Electrophysiologist:  Boyce Byes, MD      Interval History:       Jerry [name] is a 53 y.o. male who presents for a follow up visit.    The patient is known to Dr. Rodolfo Clan.  The patient has a history of coronary artery disease with prior myocardial infarction in 2021.  This resulted in chronic systolic heart failure complicated by apical thrombus and severe MR.  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  His ICD was implanted in April 2022.  He was previously evaluated for Verastem.  This was denied by his insurance.  CCM was then considered and recommended but this also was denied.  Appeals have also been denied.  At the last appointment with Dr. Mitzie Anda in February the patient reported dyspnea with exertion.   Given his NYHA class III symptoms on maximal medical management, he is being sent back for CCM consideration.  He has previously had this recommended by Dr. Rodolfo Clan.  Presents for CCM implant. Procedure reviewed.  Objective Past medical, surgical, social and family history were reviewed.   ROS:   Please see the history of present illness.    All other systems reviewed and are negative.   EKGs/Labs/Other Studies Reviewed:     The following studies were reviewed today:   May 10, 2023 echo EF 25% RV normal Mitral valve repair     Feb 08, 2024 in-clinic device interrogation personally reviewed Presenting rhythm 75V sensed Battery longevity 13 years Lead parameter stable No high-voltage therapies Heart logic normal   May 17, 2021 chest x-ray personally reviewed Single-chamber, single coil ICD in the left prepectoral position     EKG Interpretation Date/Time:                  Friday Feb 08 2024 15:47:46 EDT Ventricular Rate:         72 PR Interval:                 168 QRS Duration:             102 QT Interval:                 374 QTC Calculation:409 R Axis:                         -17   Text Interpretation:Normal sinus rhythm Confirmed by Harvie Liner [phone]) on 02/08/2024 3:51:48 PM     Physical Exam:     VS:  BP 106/70   Pulse 72   Ht 5' 8 (1.727 m)   Wt 153 lb (69.4 kg)   BMI 23.26 kg/m         Wt Readings from Last 3 Encounters:  02/08/24 153 lb (69.4 kg)  11/23/23 156 lb (70.8 kg)  11/08/23 155 lb 12.8 oz (70.7 kg)      GEN: no distress CARD: RRR, No MRG.  CIED incision well-healed RESP: No IWOB. CTAB.     Assessment ASSESSMENT:     1. Atrial fibrillation, unspecified type (HCC)     PLAN:     In order  of problems listed above:   #Chronic systolic heart failure NYHA class III symptoms on maximal medical therapy I think he would be a good candidate for CCM therapy.   Risks, benefits, alternatives to CCM implantation were discussed in detail with the patient today. The patient understands that the risks include but are not limited to bleeding, infection, pneumothorax, perforation, tamponade, vascular damage, renal failure, MI, stroke, death, and lead dislodgement and wishes to proceed.  We will therefore schedule device implantation at the next available time.   Given the patient's history of apical thrombus, target INR 1.5-2 at the time of implant.   #ICD in situ Device functioning appropriately.  Continue remote monitoring.   Presents for CCM implant today. Procedure reviewed.    Signed, Harvie Liner, MD, [facility], [facility] 03/11/2024 Electrophysiology [facility]

## 2024-03-12 ENCOUNTER — Encounter (HOSPITAL_COMMUNITY): Payer: Self-pay | Admitting: Cardiology

## 2024-03-13 ENCOUNTER — Ambulatory Visit: Attending: Cardiology

## 2024-03-13 ENCOUNTER — Telehealth: Payer: Self-pay | Admitting: Cardiovascular Disease

## 2024-03-13 DIAGNOSIS — Z0279 Encounter for issue of other medical certificate: Secondary | ICD-10-CM

## 2024-03-13 DIAGNOSIS — I5022 Chronic systolic (congestive) heart failure: Secondary | ICD-10-CM

## 2024-03-13 NOTE — Telephone Encounter (Signed)
 Received an FMLA form. I spoke with patient who said that he will be coming in today and he will sign the release and pay the $29 form fee.

## 2024-03-13 NOTE — Progress Notes (Signed)
 Patient is s/p CCM implant on 03/11/24.  Seen in Device Clinic today for removal of pressure dressing to right upper chest over the implant site.  Sterile dressing is still present at this time with a minimal amount of dried blood noted- no new bleeding- otherwise dry and intact.   The patient states he is feeling very well today. He is scheduled to resume his warfarin on 03/15/24.  He is advised to continue with all post -op instructions as per his discharge.   The patient has scheduled follow up on 03/25/24 with the Device Clinic for full wound and device checks.

## 2024-03-17 NOTE — Telephone Encounter (Signed)
 FMLA forms moved to Dr Hiram box. Pt recently had CCM generator and A/V lead insertion for which he needs the FMLA for.

## 2024-03-19 ENCOUNTER — Ambulatory Visit: Attending: Cardiology | Admitting: *Deleted

## 2024-03-19 DIAGNOSIS — I4891 Unspecified atrial fibrillation: Secondary | ICD-10-CM | POA: Diagnosis not present

## 2024-03-19 DIAGNOSIS — Z5181 Encounter for therapeutic drug level monitoring: Secondary | ICD-10-CM

## 2024-03-19 DIAGNOSIS — I824Y2 Acute embolism and thrombosis of unspecified deep veins of left proximal lower extremity: Secondary | ICD-10-CM

## 2024-03-19 LAB — POCT INR: INR: 1.2 — AB (ref 2.0–3.0)

## 2024-03-19 NOTE — Progress Notes (Signed)
Please see anticoagulation encounter.

## 2024-03-19 NOTE — Patient Instructions (Addendum)
 Description   Today take 2 tablets of warfarin and tomorrow take 1.5 tablets of warfarin then continue 1 tablet daily except 1.5 tablets on Wednesdays. Recheck INR in 1 week. Coumadin  Clinic [phone]

## 2024-03-21 NOTE — Telephone Encounter (Signed)
Form completed and placed in box.  

## 2024-03-24 NOTE — Telephone Encounter (Signed)
 FMLA form faxed to [facility] and scanned to chart. Billing notified. Copy mailed to patient.

## 2024-03-25 ENCOUNTER — Ambulatory Visit (INDEPENDENT_AMBULATORY_CARE_PROVIDER_SITE_OTHER): Admitting: *Deleted

## 2024-03-25 ENCOUNTER — Ambulatory Visit: Attending: Cardiology

## 2024-03-25 DIAGNOSIS — I5022 Chronic systolic (congestive) heart failure: Secondary | ICD-10-CM | POA: Diagnosis not present

## 2024-03-25 DIAGNOSIS — I4891 Unspecified atrial fibrillation: Secondary | ICD-10-CM

## 2024-03-25 DIAGNOSIS — I824Y2 Acute embolism and thrombosis of unspecified deep veins of left proximal lower extremity: Secondary | ICD-10-CM | POA: Diagnosis not present

## 2024-03-25 DIAGNOSIS — Z5181 Encounter for therapeutic drug level monitoring: Secondary | ICD-10-CM

## 2024-03-25 LAB — POCT INR: INR: 3.3 — AB (ref 2.0–3.0)

## 2024-03-25 NOTE — Patient Instructions (Addendum)
 Description   Today take 1/2 tablet of warfarin then continue taking 1 tablet daily except 1.5 tablets on Wednesdays. Recheck INR in 3 weeks per patient.  Coumadin  Clinic [phone]

## 2024-03-25 NOTE — Progress Notes (Signed)
Please see anticoagulation encounter.

## 2024-03-25 NOTE — Patient Instructions (Signed)
 After Your CCM procedure   Monitor your site for redness, swelling, and drainage. Call the device clinic at [phone] if you experience these symptoms or fever/chills.  Your incision was closed with Steri-strips or staples:  You may shower 7 days after your procedure and wash your incision with soap and water . Avoid lotions, ointments, or perfumes over your incision until it is well-healed.  You may use a hot tub or a pool after your wound check appointment if the incision is completely closed.  Do not lift, push or pull greater than 10 pounds with the affected arm until 6 weeks after your procedure. There are no other restrictions in arm movement after your wound check appointment.  You may drive, unless driving has been restricted by your healthcare providers.

## 2024-03-26 NOTE — Progress Notes (Signed)
 Normal CCM wound check. Wound well healed. Routine testing performed by Selinda Milo. See media for full CCM check. Reviewed arm restrictions to continue for 6 weeks total post op.    Return to work letter prepared and given to Pt.

## 2024-04-04 ENCOUNTER — Ambulatory Visit: Payer: 59

## 2024-04-04 DIAGNOSIS — I255 Ischemic cardiomyopathy: Secondary | ICD-10-CM

## 2024-04-05 ENCOUNTER — Ambulatory Visit: Payer: Self-pay | Admitting: Cardiology

## 2024-04-05 LAB — CUP PACEART REMOTE DEVICE CHECK
Battery Remaining Longevity: 156 mo
Battery Remaining Percentage: 100 %
Brady Statistic RV Percent Paced: 0 %
Date Time Interrogation Session: 20250711040100
HighPow Impedance: 80 Ohm
Implantable Lead Connection Status: 753985
Implantable Lead Implant Date: 20220413
Implantable Lead Location: 753860
Implantable Lead Model: 183
Implantable Lead Serial Number: 303520
Implantable Pulse Generator Implant Date: 20220413
Lead Channel Impedance Value: 620 Ohm
Lead Channel Pacing Threshold Amplitude: 0.9 V
Lead Channel Pacing Threshold Pulse Width: 0.4 ms
Lead Channel Setting Pacing Amplitude: 2.2 V
Lead Channel Setting Pacing Pulse Width: 0.4 ms
Lead Channel Setting Sensing Sensitivity: 0.5 mV
Pulse Gen Serial Number: 212369
Zone Setting Status: 755011

## 2024-04-15 ENCOUNTER — Ambulatory Visit

## 2024-04-22 ENCOUNTER — Ambulatory Visit: Attending: Cardiology

## 2024-04-22 DIAGNOSIS — I4891 Unspecified atrial fibrillation: Secondary | ICD-10-CM

## 2024-04-22 DIAGNOSIS — Z5181 Encounter for therapeutic drug level monitoring: Secondary | ICD-10-CM

## 2024-04-22 DIAGNOSIS — I824Y2 Acute embolism and thrombosis of unspecified deep veins of left proximal lower extremity: Secondary | ICD-10-CM

## 2024-04-22 LAB — POCT INR: INR: 2.9 (ref 2.0–3.0)

## 2024-04-22 NOTE — Progress Notes (Signed)
 INR 2.9  Please see anticoagulation encounter

## 2024-04-22 NOTE — Patient Instructions (Signed)
 continue taking 1 tablet daily except 1.5 tablets on Wednesdays. Recheck INR in 6 weeks per patient.  Coumadin  Clinic [phone]

## 2024-05-14 ENCOUNTER — Encounter (HOSPITAL_COMMUNITY)

## 2024-06-03 ENCOUNTER — Ambulatory Visit: Attending: Cardiology

## 2024-06-03 ENCOUNTER — Telehealth: Payer: Self-pay | Admitting: *Deleted

## 2024-06-03 NOTE — Telephone Encounter (Signed)
 Called patient since he missed his appointment today, there was no answer so left him a message to call back to reschedule to next available.

## 2024-06-19 NOTE — Progress Notes (Unsigned)
  Electrophysiology Office Follow up Visit Note:    Date:  06/20/2024   ID:  Jerry [name], DOB 1971/01/13, MRN [id number]  PCP:  Cleotilde Planas, MD  [facility] Cardiologist:  Ozell Fell, MD  [facility] Electrophysiologist:  OLE ONEIDA HOLTS, MD    Interval History:     Jerry [name] is a 53 y.o. male who presents for a follow up visit.   He presents for follow-up after a CCM implant March 11, 2024.  He is doing well today.  He is notices significant improvement of his symptoms after the CCM implant.      Past medical, surgical, social and family history were reviewed.  ROS:   Please see the history of present illness.    All other systems reviewed and are negative.  EKGs/Labs/Other Studies Reviewed:    The following studies were reviewed today:  June 20, 2024 in-clinic device interrogation personally reviewed Battery and lead parameter stable.  Presenting rhythm V sensed 81 bpm.  No high-voltage therapies have been delivered.        Physical Exam:    VS:  BP (!) 104/58 (BP Location: Left Arm, Patient Position: Sitting, Cuff Size: Normal)   Pulse 80   Ht 5' 8 (1.727 m)   Wt 153 lb (69.4 kg)   SpO2 95%   BMI 23.26 kg/m     Wt Readings from Last 3 Encounters:  06/20/24 153 lb (69.4 kg)  03/11/24 155 lb (70.3 kg)  03/07/24 156 lb 12.8 oz (71.1 kg)     GEN: no distress CARD: RRR, No MRG RESP: No IWOB. CTAB.      ASSESSMENT:    1. 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 Chronic systolic heart failure (HCC)    PLAN:    In order of problems listed above:  #Chronic systolic heart failure NYHA class II.  Continue GDMT  #History of LV thrombus Continue Coumadin   #CCM in situ Device functioning appropriately.  Follow-up 1 year with EP APP   Signed, OLE HOLTS, MD, [facility], [facility] 06/20/2024 1:44 PM    Electrophysiology [facility]

## 2024-06-20 ENCOUNTER — Encounter: Payer: Self-pay | Admitting: Cardiology

## 2024-06-20 ENCOUNTER — Ambulatory Visit: Attending: Cardiology | Admitting: *Deleted

## 2024-06-20 ENCOUNTER — Ambulatory Visit: Attending: Cardiology | Admitting: Cardiology

## 2024-06-20 VITALS — BP 104/58 | HR 80 | Ht 68.0 in | Wt 153.0 lb

## 2024-06-20 DIAGNOSIS — I5022 Chronic systolic (congestive) heart failure: Secondary | ICD-10-CM

## 2024-06-20 DIAGNOSIS — I4891 Unspecified atrial fibrillation: Secondary | ICD-10-CM | POA: Diagnosis not present

## 2024-06-20 DIAGNOSIS — I824Y2 Acute embolism and thrombosis of unspecified deep veins of left proximal lower extremity: Secondary | ICD-10-CM | POA: Diagnosis not present

## 2024-06-20 DIAGNOSIS — Z5181 Encounter for therapeutic drug level monitoring: Secondary | ICD-10-CM

## 2024-06-20 DIAGNOSIS — I236 Thrombosis of atrium, auricular appendage, and ventricle as current complications following acute myocardial infarction: Secondary | ICD-10-CM

## 2024-06-20 LAB — POCT INR: INR: 4.1 — AB (ref 2.0–3.0)

## 2024-06-20 NOTE — Patient Instructions (Signed)

## 2024-06-20 NOTE — Patient Instructions (Signed)
 Description   INR-4.1; Do not take any warfarin tomorrow then continue taking 1 tablet daily except 1.5 tablets on Wednesdays. Recheck INR in 2 weeks.   Coumadin  Clinic [phone]

## 2024-06-20 NOTE — Progress Notes (Signed)
 Description   INR-4.1; Do not take any warfarin tomorrow then continue taking 1 tablet daily except 1.5 tablets on Wednesdays. Recheck INR in 2 weeks.   Coumadin  Clinic [phone]

## 2024-06-23 LAB — CUP PACEART INCLINIC DEVICE CHECK
Brady Statistic RV Percent Paced: 1 % — CL
Date Time Interrogation Session: 20250926150238
Implantable Lead Connection Status: 753985
Implantable Lead Implant Date: 20220413
Implantable Lead Location: 753860
Implantable Lead Model: 183
Implantable Lead Serial Number: 303520
Implantable Pulse Generator Implant Date: 20220413
Pulse Gen Serial Number: 212369

## 2024-06-26 ENCOUNTER — Ambulatory Visit: Payer: Self-pay | Admitting: Cardiology

## 2024-06-30 ENCOUNTER — Other Ambulatory Visit (HOSPITAL_COMMUNITY): Payer: Self-pay

## 2024-06-30 DIAGNOSIS — I5022 Chronic systolic (congestive) heart failure: Secondary | ICD-10-CM

## 2024-06-30 MED ORDER — DIGOXIN 125 MCG PO TABS: 0.0625 mg | ORAL_TABLET | Freq: Every day | ORAL | 3 refills | Status: AC

## 2024-07-01 ENCOUNTER — Ambulatory Visit (HOSPITAL_COMMUNITY): Payer: Self-pay | Admitting: Cardiology

## 2024-07-01 ENCOUNTER — Encounter (HOSPITAL_COMMUNITY): Payer: Self-pay | Admitting: Cardiology

## 2024-07-01 ENCOUNTER — Ambulatory Visit (HOSPITAL_COMMUNITY)
Admission: RE | Admit: 2024-07-01 | Discharge: 2024-07-01 | Disposition: A | Discharge status: Home / Self Care | Source: Ambulatory Visit | Attending: Cardiology | Admitting: Cardiology

## 2024-07-01 VITALS — BP 102/68 | HR 67 | Wt 157.8 lb

## 2024-07-01 DIAGNOSIS — I2510 Atherosclerotic heart disease of native coronary artery without angina pectoris: Secondary | ICD-10-CM | POA: Insufficient documentation

## 2024-07-01 DIAGNOSIS — I340 Nonrheumatic mitral (valve) insufficiency: Secondary | ICD-10-CM | POA: Insufficient documentation

## 2024-07-01 DIAGNOSIS — Z79899 Other long term (current) drug therapy: Secondary | ICD-10-CM | POA: Diagnosis not present

## 2024-07-01 DIAGNOSIS — I252 Old myocardial infarction: Secondary | ICD-10-CM | POA: Diagnosis not present

## 2024-07-01 DIAGNOSIS — Z86718 Personal history of other venous thrombosis and embolism: Secondary | ICD-10-CM | POA: Diagnosis not present

## 2024-07-01 DIAGNOSIS — Z7901 Long term (current) use of anticoagulants: Secondary | ICD-10-CM | POA: Diagnosis not present

## 2024-07-01 DIAGNOSIS — I2582 Chronic total occlusion of coronary artery: Secondary | ICD-10-CM | POA: Diagnosis not present

## 2024-07-01 DIAGNOSIS — I5022 Chronic systolic (congestive) heart failure: Secondary | ICD-10-CM | POA: Insufficient documentation

## 2024-07-01 DIAGNOSIS — Z7984 Long term (current) use of oral hypoglycemic drugs: Secondary | ICD-10-CM | POA: Diagnosis not present

## 2024-07-01 DIAGNOSIS — E781 Pure hyperglyceridemia: Secondary | ICD-10-CM | POA: Insufficient documentation

## 2024-07-01 DIAGNOSIS — Z95810 Presence of automatic (implantable) cardiac defibrillator: Secondary | ICD-10-CM | POA: Insufficient documentation

## 2024-07-01 DIAGNOSIS — Z951 Presence of aortocoronary bypass graft: Secondary | ICD-10-CM | POA: Diagnosis not present

## 2024-07-01 LAB — BASIC METABOLIC PANEL WITH GFR
Anion gap: 9 (ref 5–15)
BUN: 33 mg/dL — ABNORMAL HIGH (ref 6–20)
CO2: 25 mmol/L (ref 22–32)
Calcium: 9.4 mg/dL (ref 8.9–10.3)
Chloride: 103 mmol/L (ref 98–111)
Creatinine, Ser: 1.58 mg/dL — ABNORMAL HIGH (ref 0.61–1.24)
GFR, Estimated: 52 mL/min — ABNORMAL LOW (ref >60)
Glucose, Bld: 98 mg/dL (ref 70–99)
Potassium: 4.7 mmol/L (ref 3.5–5.1)
Sodium: 137 mmol/L (ref 135–145)

## 2024-07-01 LAB — DIGOXIN LEVEL: Digoxin Level: <0.6 ng/mL — ABNORMAL LOW (ref 0.8–2.0)

## 2024-07-01 LAB — BRAIN NATRIURETIC PEPTIDE: B Natriuretic Peptide: 231 pg/mL — ABNORMAL HIGH (ref 0.0–100.0)

## 2024-07-01 MED ORDER — SACUBITRIL-VALSARTAN 24-26 MG PO TABS: 1.0000 | ORAL_TABLET | Freq: Two times a day (BID) | ORAL | 11 refills | Status: DC

## 2024-07-01 NOTE — Patient Instructions (Signed)
 CHANGE Entresto  to 24/26 mg Twice daily  Labs done today, your results will be available in MyChart, we will contact you for abnormal readings.  Your physician recommends that you schedule a follow-up appointment in: 3 months ( January ) ** PLEASE CALL THE OFFICE IN DECEMBER TO ARRANGE YOUR FOLLOW UP APPOINTMENT.**  If you have any questions or concerns before your next appointment please send us  a message through [place] or call our office at [phone].    TO LEAVE A MESSAGE FOR THE NURSE SELECT OPTION 2, PLEASE LEAVE A MESSAGE INCLUDING: YOUR NAME DATE OF BIRTH CALL BACK NUMBER REASON FOR CALL**this is important as we prioritize the call backs  YOU WILL RECEIVE A CALL BACK THE SAME DAY AS LONG AS YOU CALL BEFORE 4:00 PM  At the [facility], you and your health needs are our priority. As part of our continuing mission to provide you with exceptional heart care, we have created designated Provider Care Teams. These Care Teams include your primary Cardiologist (physician) and Advanced Practice Providers (APPs- Physician Assistants and Nurse Practitioners) who all work together to provide you with the care you need, when you need it.   You may see any of the following providers on your designated Care Team at your next follow up: Dr Toribio Fuel Dr Ezra Shuck Dr. Ria Commander Dr. Morene Brownie Amy Lenetta, NP Caffie Shed, [place] [facility] [place], [place] Beckey Coe, NP [place] Lee, NP Ellouise Class, NP Tinnie Redman, PharmD Jaun Bash, PharmD   Please be sure to bring in all your medications bottles to every appointment.    Thank you for choosing [facility]

## 2024-07-02 NOTE — Progress Notes (Signed)
 Advanced Heart Failure Clinic Note  PCP: Cleotilde Planas, MD HF Cardiology: Dr. Rolan  CT Surgery: Dr. Kerrin   Chief complaint: CHF  53 y.o. male s/p back surgery 05/2020 was admitted for acute inferior and anterolateral MI on 06/25/20. LHC showed thrombotic occlusion of distal RCA that was treated with PTCA and thrombectomy.  He was also noted to have chronic total occlusion of the LAD with collaterals from the right (thus this territory was affected by the RCA MI) as well as 90% complex proximal LCx stenosis. EF 25-30% + apical thrombus and severe mitral regurgitation. RV normal. Also w/ post MI pericarditis, treated w/ colchicine . Course further c/b cardiogenic shock and acute hypoxic respiratory failure w/ component of septic shock 2/2 PNA, requiring intubation and treatment w/ abx. Once stabilized, he underwent CABG x 3 (LIMA-LAD, Lt radial-OM1 and sequential SVG-PDA/PLA) + MV repair and placement of Impella 5.5 on 10/13 by Dr. Kerrin.   Post operatively, he developed tamponade and was taken back to the OR on 10/14 with large hematoma obstructing right atrium. Impella removal 10/21 w/ stable hemodynamics and able to tolerate initiation of GDMT.  Also had post-operative afib treated w/ amiodarone . He was discharged home on 07/21/20.   Echo in 3/22 showed EF 20-25% with diffuse hypokinesis, mildly decreased RV systolic function, no LV thrombus, normal IVC, s/p MV repair with trivial MR and mild stenosis (mean gradient 4 mmHg).  [employer] ICD placed in 4/22.  Later in 4/22, he had L4-L5 spinal fusion.   CPX in 10/22 showed mild-moderate HF limitation. Echo 4/23 EF 25-30%, no LV thrombus, normal RV.  Echo in 8/24 showed EF 25% with mild mitral regurgitation s/p MV repair, normal RV. CPX in 9/24 showed a mild to moderate HF limitation.   Patient had cardiac contractility modulator implanted in 6/25.    Today he returns for HF follow up. Weight stable.  He is only taking Entresto   once daily due to low BP.  Lightheadedness resolved with this change.  He is feeling better since CCM implanted.  More energy.  No exertional dyspnea at this point.  No orthopnea/PND.  No chest pain.    [employer] device interrogation (personally reviewed): HeartLogic 5, no VT  ECG (personally reviewed): NSR, anterior and inferior Qs  Labs (11/24): LDL 47, TGs 86 Labs (4/25): Pro-BNP 536, K 4.7, creatinine 1.29.  Labs (2/25): LDL 53 Labs (6/25): K 4.7 => 5.1, creatinine 1.97 => 1.6, BNP 205, digoxin  level 0.4  Review of systems complete and found to be negative unless listed in HPI.    PMH: 1. Anxiety 2. Hyperlipidemia 3. CAD: Inferior and anterolateral MI 10/21.  LHC showed thrombotic occlusion distal RCA treated with PTCA/thrombectomy, CTO LAD with R=>L collaterals, 90% complex proximal LCx stenosis.  - CABG x 4 in 10/21 with LIMA-LAD, left radial-OM1, sequential SVG-PDA/PLV.  4. Mitral regurgitation: Suspect infarct-related MR.   - Mitral valve repair in 10/21 with CABG.  5. LV thrombus  6. Chronic systolic CHF: ischemic cardiomyopathy.  [employer] ICD.  Echo (10/21) with EF 25-30%, apical thrombus, severe MR, normal RV.   - Echo (3/22): EF 20-25% with diffuse hypokinesis, mildly decreased RV systolic function, no LV thrombus, normal IVC, s/p MV repair with trivial MR and mild stenosis (mean gradient 4 mmHg).   - CPX (10/22): RER 1.17, VE/VCO2 slope 31, peak VO2 22.7 => mild-moderate HF limitation.  - Echo (4/23): EF 25-30%, no LV thrombus, normal RV. - Echo (8/24): EF 25% with mild  mitral regurgitation s/p MV repair, normal RV.  - CPX (9/24): peak VO2 24, VE/VCO2 slope 33, RER 1.09 - Cardiac contractility modulator implantation 6/25 7. Post-infarct pericarditis in 10/21. 8. Atrial fibrillation: Post-op in 10/21.  9. H/o back surgery 9/21: Discectomy.   10. CKD stage 3  Current Outpatient Medications  Medication Sig Dispense Refill   ALPRAZolam  (XANAX ) 1 MG tablet  Take 1 mg by mouth at bedtime.     atorvastatin  (LIPITOR ) 80 MG tablet Take 1 tablet (80 mg total) by mouth daily. 90 tablet 3   carvedilol  (COREG ) 6.25 MG tablet Take 1 tablet (6.25 mg total) by mouth 2 (two) times daily with a meal. 60 tablet 5   digoxin  (LANOXIN ) 0.125 MG tablet Take 0.5 tablets (0.0625 mg total) by mouth daily. 15.5 each 3   icosapent  Ethyl (VASCEPA ) 1 g capsule Take 2 capsules (2 g total) by mouth 2 (two) times daily. 120 capsule 11   JARDIANCE  10 MG TABS tablet TAKE 1 TABLET(10 MG) BY MOUTH DAILY BEFORE BREAKFAST 30 tablet 11   Multiple Vitamin (MULTIVITAMIN WITH MINERALS) TABS tablet Take 1 tablet by mouth in the morning.     sacubitril -valsartan  (ENTRESTO ) 24-26 MG Take 1 tablet by mouth 2 (two) times daily. 60 tablet 11   spironolactone  (ALDACTONE ) 25 MG tablet TAKE 1 TABLET(25 MG) BY MOUTH AT BEDTIME 90 tablet 3   torsemide  (DEMADEX ) 20 MG tablet Take 0.5 tablets (10 mg total) by mouth daily. 45 tablet 3   traZODone  (DESYREL ) 100 MG tablet Take 100 mg by mouth at bedtime.     warfarin (COUMADIN ) 2.5 MG tablet TAKE 1 TO 1 AND 1/2 TABLETS BY MOUTH DAILY AS DIRECTED BY COUMADIN  CLINIC 100 tablet 1   No current facility-administered medications for this encounter.   Allergies  Allergen Reactions   Fenofibrate  Other (See Comments)    GI upset, Joint pain    Social History   Socioeconomic History   Marital status: Single    Spouse name: Not on file   Number of children: Not on file   Years of education: Not on file   Highest education level: Not on file  Occupational History    Employer: [employer]  Tobacco Use   Smoking status: Never   Smokeless tobacco: Never  Vaping Use   Vaping status: Never Used  Substance and Sexual Activity   Alcohol use: Yes    Alcohol/week: 1.0 standard drink of alcohol    Types: 1 Standard drinks or equivalent per week   Drug use: No   Sexual activity: Yes    Birth control/protection: None  Other Topics Concern   Not  on file  Social History Narrative   Not on file   Social Drivers of Health   Financial Resource Strain: Not on file  Food Insecurity: Not on file  Transportation Needs: Not on file  Physical Activity: Not on file  Stress: Not on file  Social Connections: Unknown (02/06/2022)   [facility] from [facility]   Social Network    Social Network: Not on file  Intimate Partner Violence: Unknown (12/29/2021)   Received from [facility]   HITS    Physically Hurt: Not on file    Insult or Talk Down To: Not on file    Threaten Physical Harm: Not on file    Scream or Curse: Not on file   FH: No family history of premature CAD.   BP 102/68   Pulse 67   Wt 71.6 kg (  157 lb 12.8 oz)   SpO2 98%   BMI 23.99 kg/m   Wt Readings from Last 3 Encounters:  07/01/24 71.6 kg (157 lb 12.8 oz)  06/20/24 69.4 kg (153 lb)  03/11/24 70.3 kg (155 lb)   PHYSICAL EXAM: General: NAD Neck: No JVD, no thyromegaly or thyroid  nodule.  Lungs: Clear to auscultation bilaterally with normal respiratory effort. CV: Nondisplaced PMI.  Heart regular S1/S2, no S3/S4, no murmur.  No peripheral edema.    Abdomen: Soft, nontender, no hepatosplenomegaly, no distention.  Skin: Intact without lesions or rashes.  Neurologic: Alert and oriented x 3.  Psych: Normal affect. Extremities: No clubbing or cyanosis.  HEENT: Normal.   ASSESSMENT & PLAN: 1. CAD: Late presentation inferior and anterolateral MI 10/21.  Cath showed thrombotic occlusion distal RCA, CTO LAD with collaterals from right, and complex 90% proximal LCx stenosis.  He had PTCA/thrombectomy RCA with good flow at end of procedure.  Suspect he had damage to LAD territory as well as RCA territory as RCA collateralized the LAD.  S/p CABG w/ impella support with LIMA-LAD, left radial to OM, seq SVG-PDA/PLV. Post op course c/b tamponade and was taken back to the OR on 10/14 for drainage, found to have large hematoma obstructing right atrium.  No chest pain - no ASA  given need for coumadin   - Continue atorvastatin  80 mg daily. Good lipids 2/25.  2. 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 Echo in 10/21 with EF 25-30%.  Echo in 3/22 showed EF 20-25%, diffuse hypokinesis, mildly decreased RV systolic function.  He now has a Environmental manager ICD.  CPX in 10/22 with mild-moderate HF limitation.  Echo 4/23 showed EF 25-30%, no LV thrombus, normal RV.  Echo in 8/24 showed that EF remains low at 25%, s/p MV repair with mild MR, RV normal. CPX in 9/24 showed mild to moderate HF limitation. Cardiac contractility modulator implantation in 6/25.  Patient is not volume overloaded by exam or HeartLogic.  Symptoms improved, seem to be NYHA class I.  - I will have him change Entresto  dosing to 24/26 bid rather than 49/51 once daily. BMET/BNP today.  - Continue spironolactone  25 mg qhs. - Continue Coreg  6.25 mg bid. Titration limited by fatigue and low BP  - Continue digoxin  0.0625 mg daily. Check digoxin  level.  - Continue Jardiance  10 mg daily.  - Continue torsemide  10 mg daily.  - Narrow QRS so not CRT candidate.   - Plan to repeat CPX and echo in 1/26, he wants to wait 6 months post-CCM placement.  3. Severe MR: Suspect infarct-related MR. s/p MV repair at time of CABG 10/21.  Mild MR on 4/23 echo. Mild on 8/24 echo.  4. LV thrombus:  - Continue warfarin indefinitely given ongoing low EF. Check INR today and will forward to Coumadin  Clinic. He has wanted to continue warfarin rather than switching to Eliquis.  5. Atrial fibrillation: Post-CABG. NSR today.  - Continue warfarin.  6. Hypertriglyceridemia: Unable to tolerate fenofibrate  due to GI upset. Now on Vascepa .   - Good lipids 2/25.  Follow up in 3 months with APP  I spent 32 minutes reviewing records, interviewing/examining patient, and managing orders   Ezra Shuck, MD 07/02/24

## 2024-07-03 ENCOUNTER — Ambulatory Visit: Attending: Cardiovascular Disease | Admitting: *Deleted

## 2024-07-03 DIAGNOSIS — Z5181 Encounter for therapeutic drug level monitoring: Secondary | ICD-10-CM | POA: Diagnosis not present

## 2024-07-03 DIAGNOSIS — I4891 Unspecified atrial fibrillation: Secondary | ICD-10-CM

## 2024-07-03 DIAGNOSIS — Z9889 Other specified postprocedural states: Secondary | ICD-10-CM

## 2024-07-03 LAB — POCT INR: POC INR: 2.3

## 2024-07-03 NOTE — Patient Instructions (Signed)
 Description   INR-2,3; continue taking 1 tablet daily except 1.5 tablets on Wednesdays. Recheck INR in 4 weeks.   Coumadin  Clinic [phone]

## 2024-07-03 NOTE — Progress Notes (Signed)
 Remote ICD Transmission

## 2024-07-03 NOTE — Progress Notes (Signed)
 Lab Results  Component Value Date   INR 2.3 07/03/2024   INR 4.1 (A) 06/20/2024   INR 2.9 04/22/2024    Description   INR-2,3; continue taking 1 tablet daily except 1.5 tablets on Wednesdays. Recheck INR in 4 weeks.   Coumadin  Clinic [phone]

## 2024-07-04 ENCOUNTER — Ambulatory Visit (INDEPENDENT_AMBULATORY_CARE_PROVIDER_SITE_OTHER): Payer: 59

## 2024-07-04 DIAGNOSIS — I5022 Chronic systolic (congestive) heart failure: Secondary | ICD-10-CM

## 2024-07-08 ENCOUNTER — Other Ambulatory Visit (HOSPITAL_COMMUNITY): Payer: Self-pay

## 2024-07-08 DIAGNOSIS — I5022 Chronic systolic (congestive) heart failure: Secondary | ICD-10-CM

## 2024-07-08 MED ORDER — SACUBITRIL-VALSARTAN 24-26 MG PO TABS: 1.0000 | ORAL_TABLET | Freq: Two times a day (BID) | ORAL | 11 refills | Status: AC

## 2024-07-09 LAB — CUP PACEART REMOTE DEVICE CHECK
Battery Remaining Longevity: 150 mo
Battery Remaining Percentage: 100 %
Brady Statistic RV Percent Paced: 0 %
Date Time Interrogation Session: 20251014180700
HighPow Impedance: 79 Ohm
Implantable Lead Connection Status: 753985
Implantable Lead Implant Date: 20220413
Implantable Lead Location: 753860
Implantable Lead Model: 183
Implantable Lead Serial Number: 303520
Implantable Pulse Generator Implant Date: 20220413
Lead Channel Impedance Value: 609 Ohm
Lead Channel Pacing Threshold Amplitude: 1 V
Lead Channel Pacing Threshold Pulse Width: 0.4 ms
Lead Channel Setting Pacing Amplitude: 2 V
Lead Channel Setting Pacing Pulse Width: 0.4 ms
Lead Channel Setting Sensing Sensitivity: 0.5 mV
Pulse Gen Serial Number: 212369
Zone Setting Status: 755011

## 2024-07-10 ENCOUNTER — Ambulatory Visit: Payer: Self-pay | Admitting: Cardiology

## 2024-07-10 NOTE — Progress Notes (Signed)
 Remote ICD Transmission

## 2024-07-14 ENCOUNTER — Encounter (HOSPITAL_COMMUNITY): Payer: Self-pay

## 2024-07-14 ENCOUNTER — Telehealth (HOSPITAL_COMMUNITY): Payer: Self-pay

## 2024-07-14 NOTE — Telephone Encounter (Signed)
 FMLA paper work faxed to [place] [place] at [phone]. My chart message sent to patient

## 2024-07-20 ENCOUNTER — Other Ambulatory Visit (HOSPITAL_COMMUNITY): Payer: Self-pay | Admitting: Family Medicine

## 2024-07-31 ENCOUNTER — Ambulatory Visit

## 2024-08-01 ENCOUNTER — Ambulatory Visit: Attending: Cardiology | Admitting: *Deleted

## 2024-08-01 DIAGNOSIS — Z5181 Encounter for therapeutic drug level monitoring: Secondary | ICD-10-CM

## 2024-08-01 DIAGNOSIS — Z9889 Other specified postprocedural states: Secondary | ICD-10-CM

## 2024-08-01 DIAGNOSIS — I4891 Unspecified atrial fibrillation: Secondary | ICD-10-CM

## 2024-08-01 LAB — POCT INR: INR: 2.5 (ref 2.0–3.0)

## 2024-08-01 NOTE — Patient Instructions (Addendum)
 Description   INR-2.5; continue taking 1 tablet daily except 1.5 tablets on Wednesdays. Recheck INR in 6 weeks.   Coumadin  Clinic [phone]

## 2024-08-01 NOTE — Progress Notes (Signed)
 Description   INR-2.5; continue taking 1 tablet daily except 1.5 tablets on Wednesdays. Recheck INR in 6 weeks.   Coumadin  Clinic [phone]

## 2024-08-27 ENCOUNTER — Telehealth: Payer: Self-pay | Admitting: *Deleted

## 2024-08-27 NOTE — Telephone Encounter (Addendum)
 Called patient since there has been a staffing change on 09/12/24 in the afternoon and need to reschedule his 330pm appointment. There was no answer and left him a message to call back [phone] regarding this. Will try back at a later date/time.  08/27/24 at 514pm pt returned call and was available Thursday 09/11/24 at our available time.

## 2024-09-10 ENCOUNTER — Other Ambulatory Visit (HOSPITAL_COMMUNITY): Payer: Self-pay | Admitting: Cardiology

## 2024-09-10 DIAGNOSIS — I5022 Chronic systolic (congestive) heart failure: Secondary | ICD-10-CM

## 2024-09-10 MED ORDER — CARVEDILOL 6.25 MG PO TABS: 6.2500 mg | ORAL_TABLET | Freq: Two times a day (BID) | ORAL | 5 refills | Status: AC

## 2024-09-11 ENCOUNTER — Ambulatory Visit: Attending: Cardiology

## 2024-09-11 DIAGNOSIS — Z9889 Other specified postprocedural states: Secondary | ICD-10-CM

## 2024-09-11 DIAGNOSIS — I4891 Unspecified atrial fibrillation: Secondary | ICD-10-CM

## 2024-09-11 DIAGNOSIS — Z5181 Encounter for therapeutic drug level monitoring: Secondary | ICD-10-CM | POA: Diagnosis not present

## 2024-09-11 LAB — POCT INR: INR: 1.9 — AB (ref 2.0–3.0)

## 2024-09-11 NOTE — Progress Notes (Signed)
 INR 1.9 Please see anticoagulation encounter Take 1.5 tablets today only then continue taking 1 tablet daily except 1.5 tablets on Wednesdays. Recheck INR in 6 weeks.   Coumadin  Clinic [phone]

## 2024-09-11 NOTE — Patient Instructions (Signed)
 Take 1.5 tablets today only then continue taking 1 tablet daily except 1.5 tablets on Wednesdays. Recheck INR in 6 weeks.   Coumadin  Clinic [phone]

## 2024-09-12 ENCOUNTER — Ambulatory Visit

## 2024-09-26 NOTE — Progress Notes (Signed)
 "   Advanced Heart Failure Clinic Note  PCP: Cleotilde Planas, MD CT Surgery: Dr. Kerrin HF Cardiology: Dr. Rolan    54 y.o. male s/p back surgery 05/2020 was admitted for acute inferior and anterolateral MI on 06/25/20. LHC showed thrombotic occlusion of distal RCA that was treated with PTCA and thrombectomy.  He was also noted to have chronic total occlusion of the LAD with collaterals from the right (thus this territory was affected by the RCA MI) as well as 90% complex proximal LCx stenosis. EF 25-30% + apical thrombus and severe mitral regurgitation. RV normal. Also w/ post MI pericarditis, treated w/ colchicine . Course further c/b cardiogenic shock and acute hypoxic respiratory failure w/ component of septic shock 2/2 PNA, requiring intubation and treatment w/ abx. Once stabilized, he underwent CABG x 3 (LIMA-LAD, Lt radial-OM1 and sequential SVG-PDA/PLA) + MV repair and placement of Impella 5.5 on 10/13 by Dr. Kerrin.   Post operatively, he developed tamponade and was taken back to the OR on 10/14 with large hematoma obstructing right atrium. Impella removal 10/21 w/ stable hemodynamics and able to tolerate initiation of GDMT.  Also had post-operative afib treated w/ amiodarone . He was discharged home on 07/21/20.   Echo in 3/22 showed EF 20-25% with diffuse hypokinesis, mildly decreased RV systolic function, no LV thrombus, normal IVC, s/p MV repair with trivial MR and mild stenosis (mean gradient 4 mmHg).  [employer] ICD placed in 4/22.  Later in 4/22, he had L4-L5 spinal fusion.   CPX in 10/22 showed mild-moderate HF limitation. Echo 4/23 EF 25-30%, no LV thrombus, normal RV.  Echo in 8/24 showed EF 25% with mild mitral regurgitation s/p MV repair, normal RV. CPX in 9/24 showed a mild to moderate HF limitation.   Patient had cardiac contractility modulator implanted in 6/25.    Today he returns for HF follow up. Overall feeling better since CCM, has more energy. 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 Denies palpitations, abnormal bleeding, CP, dizziness, edema, or PND/Orthopnea. Appetite ok. Weight at home 155 pounds. Taking all medications.   Geographical Information Systems Officer (personally reviewed): HeartLogic 8, average HR 73 bpm, 1.4 hr/day activity, no VT  ECG (personally reviewed): none ordered today.  Labs (11/24): LDL 47, TGs 86 Labs (4/25): Pro-BNP 536, K 4.7, creatinine 1.29.  Labs (2/25): LDL 53 Labs (6/25): K 4.7 => 5.1, creatinine 1.97 => 1.6, BNP 205, digoxin  level 0.4 Labs (10/25): K 4.7, creatinine 1.58  Review of systems complete and found to be negative unless listed in HPI.    PMH: 1. Anxiety 2. Hyperlipidemia 3. CAD: Inferior and anterolateral MI 10/21.  LHC showed thrombotic occlusion distal RCA treated with PTCA/thrombectomy, CTO LAD with R=>L collaterals, 90% complex proximal LCx stenosis.  - CABG x 4 in 10/21 with LIMA-LAD, left radial-OM1, sequential SVG-PDA/PLV.  4. Mitral regurgitation: Suspect infarct-related MR.   - Mitral valve repair in 10/21 with CABG.  5. LV thrombus  6. Chronic systolic CHF: ischemic cardiomyopathy.  [employer] ICD.  Echo (10/21) with EF 25-30%, apical thrombus, severe MR, normal RV.   - Echo (3/22): EF 20-25% with diffuse hypokinesis, mildly decreased RV systolic function, no LV thrombus, normal IVC, s/p MV repair with trivial MR and mild stenosis (mean gradient 4 mmHg).   - CPX (10/22): RER 1.17, VE/VCO2 slope 31, peak VO2 22.7 => mild-moderate HF limitation.  - Echo (4/23): EF 25-30%, no LV thrombus, normal RV. - Echo (8/24): EF 25% with mild  mitral regurgitation s/p MV repair, normal RV.  - CPX (9/24): peak VO2 24, VE/VCO2 slope 33, RER 1.09 - Cardiac contractility modulator implantation 6/25 7. Post-infarct pericarditis in 10/21. 8. Atrial fibrillation: Post-op in 10/21.  9. H/o back surgery 9/21: Discectomy.   10. CKD stage 3  Current Outpatient Medications  Medication Sig  Dispense Refill   ALPRAZolam  (XANAX ) 1 MG tablet Take 1 mg by mouth at bedtime.     atorvastatin  (LIPITOR ) 80 MG tablet Take 1 tablet (80 mg total) by mouth daily. 90 tablet 3   carvedilol  (COREG ) 6.25 MG tablet Take 1 tablet (6.25 mg total) by mouth 2 (two) times daily with a meal. 60 tablet 5   digoxin  (LANOXIN ) 0.125 MG tablet Take 0.5 tablets (0.0625 mg total) by mouth daily. 15.5 each 3   icosapent  Ethyl (VASCEPA ) 1 g capsule Take 2 capsules (2 g total) by mouth 2 (two) times daily. 120 capsule 11   JARDIANCE  10 MG TABS tablet TAKE 1 TABLET(10 MG) BY MOUTH DAILY BEFORE BREAKFAST 30 tablet 11   Multiple Vitamin (MULTIVITAMIN WITH MINERALS) TABS tablet Take 1 tablet by mouth in the morning.     sacubitril -valsartan  (ENTRESTO ) 24-26 MG Take 1 tablet by mouth 2 (two) times daily. 60 tablet 11   spironolactone  (ALDACTONE ) 25 MG tablet TAKE 1 TABLET(25 MG) BY MOUTH AT BEDTIME 90 tablet 3   torsemide  (DEMADEX ) 20 MG tablet Take 0.5 tablets (10 mg total) by mouth daily. 45 tablet 3   traZODone  (DESYREL ) 100 MG tablet Take 100 mg by mouth at bedtime.     warfarin (COUMADIN ) 2.5 MG tablet TAKE 1 TO 1 AND 1/2 TABLETS BY MOUTH DAILY AS DIRECTED BY COUMADIN  CLINIC 100 tablet 1   No current facility-administered medications for this encounter.   Allergies  Allergen Reactions   Fenofibrate  Other (See Comments)    GI upset, Joint pain    Social History   Socioeconomic History   Marital status: Single    Spouse name: Not on file   Number of children: Not on file   Years of education: Not on file   Highest education level: Not on file  Occupational History    Employer: [employer]  Tobacco Use   Smoking status: Never   Smokeless tobacco: Never  Vaping Use   Vaping status: Never Used  Substance and Sexual Activity   Alcohol use: Yes    Alcohol/week: 1.0 standard drink of alcohol    Types: 1 Standard drinks or equivalent per week   Drug use: No   Sexual activity: Yes    Birth  control/protection: None  Other Topics Concern   Not on file  Social History Narrative   Not on file   Social Drivers of Health   Tobacco Use: Low Risk (09/29/2024)   Patient History    Smoking Tobacco Use: Never    Smokeless Tobacco Use: Never    Passive Exposure: Not on file  Financial Resource Strain: Not on file  Food Insecurity: Not on file  Transportation Needs: Not on file  Physical Activity: Not on file  Stress: Not on file  Social Connections: Unknown (02/06/2022)   Received from [facility]   Social Network    Social Network: Not on file  Intimate Partner Violence: Unknown (12/29/2021)   Received from [facility]   HITS    Physically Hurt: Not on file    Insult or Talk Down To: Not on file    Threaten Physical Harm: Not on  file    Scream or Curse: Not on file  Depression (PHQ2-9): Not on file  Alcohol Screen: Not on file  Housing: Not on file  Utilities: Not on file  Health Literacy: Not on file   FH: No family history of premature CAD.   Wt Readings from Last 3 Encounters:  09/29/24 71.2 kg (157 lb)  07/01/24 71.6 kg (157 lb 12.8 oz)  06/20/24 69.4 kg (153 lb)   BP 114/78   Pulse 77   Ht 5' 8 (1.727 m)   Wt 71.2 kg (157 lb)   SpO2 96%   BMI 23.87 kg/m   PHYSICAL EXAM: General:  NAD. No resp difficulty, walked into clinic HEENT: Normal Neck: Supple. No JVD. Cor: Regular rate & rhythm. No rubs, gallops or murmurs. Lungs: Clear Abdomen: Soft, nontender, nondistended.  Extremities: No cyanosis, clubbing, rash, edema Neuro: Alert & oriented x 3, moves all 4 extremities w/o difficulty. Affect pleasant.   ASSESSMENT & PLAN: 1. CAD: Late presentation inferior and anterolateral MI 10/21.  Cath showed thrombotic occlusion distal RCA, CTO LAD with collaterals from right, and complex 90% proximal LCx stenosis.  He had PTCA/thrombectomy RCA with good flow at end of procedure.  Suspect he had damage to LAD territory as well as RCA territory as RCA  collateralized the LAD.  S/p CABG w/ impella support with LIMA-LAD, left radial to OM, seq SVG-PDA/PLV. Post op course c/b tamponade and was taken back to the OR on 10/14 for drainage, found to have large hematoma obstructing right atrium.  No chest pain - no ASA given need for coumadin   - Continue atorvastatin  80 mg daily. Good lipids 2/25.  2. 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 Echo in 10/21 with EF 25-30%.  Echo in 3/22 showed EF 20-25%, diffuse hypokinesis, mildly decreased RV systolic function.  He now has a Environmental Manager ICD.  CPX in 10/22 with mild-moderate HF limitation.  Echo 4/23 showed EF 25-30%, no LV thrombus, normal RV.  Echo in 8/24 showed that EF remains low at 25%, s/p MV repair with mild MR, RV normal. CPX in 9/24 showed mild to moderate HF limitation. Cardiac contractility modulator implantation in 6/25.  Patient is not volume overloaded by exam or HeartLogic.  Symptoms improved, seem to be NYHA class I.  - Continue Entresto  24/26 mg bid (low BP on higher dose). BMET and BNP today. - Continue spironolactone  25 mg qhs. - Continue Coreg  6.25 mg bid.  - Continue digoxin  0.0625 mg daily. Check digoxin  level.  - Continue Jardiance  10 mg daily.  - Continue torsemide  10 mg daily.  - Narrow QRS so not CRT candidate.   - Repeat CPX. - Repeat echo in 3 months, per patient request. 3. Severe MR: Suspect infarct-related MR. s/p MV repair at time of CABG 10/21.  Mild MR on 4/23 echo. Mild on 8/24 echo.  4. LV thrombus:  - Continue warfarin indefinitely given ongoing low EF. INR followed by Coumadin  Clinic. - He has wanted to continue warfarin rather than switching to Eliquis.  5. Atrial fibrillation: Post-CABG. Regular on exam today.  - Continue warfarin.  6. Hypertriglyceridemia: Unable to tolerate fenofibrate  due to GI upset. Now on Vascepa .   - Good lipids 2/25.  Follow up in 3 months with Dr. Rolan + echo   Harlene HERO  [place], [place] 09/29/2024 "

## 2024-09-29 ENCOUNTER — Ambulatory Visit (HOSPITAL_COMMUNITY)
Admission: RE | Admit: 2024-09-29 | Discharge: 2024-09-29 | Disposition: A | Discharge status: Home / Self Care | Source: Ambulatory Visit | Attending: Family Medicine | Admitting: Family Medicine

## 2024-09-29 ENCOUNTER — Encounter (HOSPITAL_COMMUNITY): Payer: Self-pay

## 2024-09-29 VITALS — BP 114/78 | HR 77 | Ht 68.0 in | Wt 157.0 lb

## 2024-09-29 DIAGNOSIS — I255 Ischemic cardiomyopathy: Secondary | ICD-10-CM | POA: Insufficient documentation

## 2024-09-29 DIAGNOSIS — I236 Thrombosis of atrium, auricular appendage, and ventricle as current complications following acute myocardial infarction: Secondary | ICD-10-CM

## 2024-09-29 DIAGNOSIS — I4891 Unspecified atrial fibrillation: Secondary | ICD-10-CM | POA: Diagnosis not present

## 2024-09-29 DIAGNOSIS — I5022 Chronic systolic (congestive) heart failure: Secondary | ICD-10-CM | POA: Insufficient documentation

## 2024-09-29 DIAGNOSIS — E781 Pure hyperglyceridemia: Secondary | ICD-10-CM | POA: Diagnosis not present

## 2024-09-29 DIAGNOSIS — I252 Old myocardial infarction: Secondary | ICD-10-CM | POA: Insufficient documentation

## 2024-09-29 DIAGNOSIS — Z7984 Long term (current) use of oral hypoglycemic drugs: Secondary | ICD-10-CM | POA: Insufficient documentation

## 2024-09-29 DIAGNOSIS — Z7901 Long term (current) use of anticoagulants: Secondary | ICD-10-CM | POA: Diagnosis not present

## 2024-09-29 DIAGNOSIS — Z95810 Presence of automatic (implantable) cardiac defibrillator: Secondary | ICD-10-CM | POA: Insufficient documentation

## 2024-09-29 DIAGNOSIS — Z9889 Other specified postprocedural states: Secondary | ICD-10-CM | POA: Diagnosis not present

## 2024-09-29 DIAGNOSIS — Z86718 Personal history of other venous thrombosis and embolism: Secondary | ICD-10-CM | POA: Diagnosis not present

## 2024-09-29 DIAGNOSIS — Z79899 Other long term (current) drug therapy: Secondary | ICD-10-CM | POA: Insufficient documentation

## 2024-09-29 DIAGNOSIS — Z951 Presence of aortocoronary bypass graft: Secondary | ICD-10-CM | POA: Insufficient documentation

## 2024-09-29 DIAGNOSIS — E785 Hyperlipidemia, unspecified: Secondary | ICD-10-CM | POA: Diagnosis not present

## 2024-09-29 DIAGNOSIS — I2510 Atherosclerotic heart disease of native coronary artery without angina pectoris: Secondary | ICD-10-CM | POA: Insufficient documentation

## 2024-09-29 DIAGNOSIS — I480 Paroxysmal atrial fibrillation: Secondary | ICD-10-CM

## 2024-09-29 LAB — BASIC METABOLIC PANEL WITH GFR
Anion gap: 9 (ref 5–15)
BUN: 23 mg/dL — ABNORMAL HIGH (ref 6–20)
CO2: 27 mmol/L (ref 22–32)
Calcium: 9.4 mg/dL (ref 8.9–10.3)
Chloride: 99 mmol/L (ref 98–111)
Creatinine, Ser: 1.48 mg/dL — ABNORMAL HIGH (ref 0.61–1.24)
GFR, Estimated: 56 mL/min — ABNORMAL LOW
Glucose, Bld: 85 mg/dL (ref 70–99)
Potassium: 4.6 mmol/L (ref 3.5–5.1)
Sodium: 136 mmol/L (ref 135–145)

## 2024-09-29 LAB — PRO BRAIN NATRIURETIC PEPTIDE: Pro Brain Natriuretic Peptide: 420 pg/mL — ABNORMAL HIGH

## 2024-09-29 LAB — DIGOXIN LEVEL: Digoxin Level: 0.8 ng/mL (ref 0.8–2.0)

## 2024-09-29 NOTE — Patient Instructions (Addendum)
 There has been no changes to your medications.  Labs reviewed by RN, will forward to MD for review  You are scheduled for a Cardiopulmonary Exercise (CPX) Test as [facility] on: Date:  10/14/24    Time: 1.30 pm    Expect to be in the lab for 2 hours. Please plan to arrive 30 minutes prior to your appointment. You may be asked to reschedule your test if you arrive 20 minutes or more after your scheduled appointment time.  Main Campus address: [address] [place], [place] [address] You may arrive to the Main Entrance A or Entrance C (free valet parking is available at both). -Main Entrance A (on [address]) :proceed to admitting for check in -Entrance C (on [employer]): proceed to [employer] parking or under hospital deck parking using this code ___2026______  Check In: [facility] waiting room (1st floor)   General Instructions for the day of the test (Please follow all instructions from your physician): Refrain from ingesting a heavy meal, alcohol, or caffeine or using tobacco products within 2 hours of the test (DO NOT FAST for mare than 8 hours). You may have all other non-alcoholic, non -caffeinated beverage,a light snack (crackers,a piece of fruit, carrot sticks, toast bagel,etc) up to your appointment. Avoid significant exertion or exercise within 24 hours of your test. Be prepared to exercise and sweat. Your clothing should permit freedom of movement and include walking or running shoes. Women bring loose fitting short sleeved blouse.  This evaluation may be fatiguing and you may wish ti have someone accompany you to the assessment to drive you home afterward. Bring a list of your medications with you, including dosage and frequency you take the medications (  I.e.,once per day, twice per day, etc). Take all medications as prescribed, unless noted below or instructed to do so by your physician.  Please do not take the following medications prior to your  CPX:  _________________________________________________  _________________________________________________  Brief description of the test: A brief lung test will be performed. This will involve you taking deep breaths and blowing hard and fast through your mouth. During these , a clip will be on your nose and you will be breathing through a breathing device.   For the exercise portion of the test you will be walking on a treadmill, or riding a stationary bike, to your maximal effor or until symptoms such as chest pain, shortness of breath, leg pain or dizziness limit your exercise. You will be breathing in and out of a breathing device through your mouth (a clip will be on your nose again). Your heart rate, ECG, blood pressure, oxygen saturations, breathing rate and depth, amount of oxygen you consume and amount of carbon dioxide you produce will be measured and monitored throughout the exercise test.  If you need to cancel or reschedule your appointment please call [phone] If you have further questions please call your physician or Damien Nunnery at [phone]  Your physician recommends that you schedule a follow-up appointment in: 3 months ( April) ** PLEASE CALL THE OFFICE IN [place] TO ARRANGE YOUR FOLLOW UP APPOINTMENT.**  If you have any questions or concerns before your next appointment please send us  a message through [place] or call our office at [phone].    TO LEAVE A MESSAGE FOR THE NURSE SELECT OPTION 2, PLEASE LEAVE A MESSAGE INCLUDING: YOUR NAME DATE OF BIRTH CALL BACK NUMBER REASON FOR CALL**this is important as we prioritize the call  backs  YOU WILL RECEIVE A CALL BACK THE SAME DAY AS LONG AS YOU CALL BEFORE 4:00 PM  At the [facility], you and your health needs are our priority. As part of our continuing mission to provide you with exceptional heart care, we have created designated Provider Care Teams. These Care Teams include your primary  Cardiologist (physician) and Advanced Practice Providers (APPs- Physician Assistants and Nurse Practitioners) who all work together to provide you with the care you need, when you need it.   You may see any of the following providers on your designated Care Team at your next follow up: Dr Toribio Fuel Dr Ezra Shuck Dr. Morene Brownie Greig Mosses, NP Caffie Shed, [place] [facility] [place], [place] Beckey Coe, NP Jordan Lee, NP Ellouise Class, NP Tinnie Redman, PharmD Jaun Bash, PharmD   Please be sure to bring in all your medications bottles to every appointment.    Thank you for choosing [facility]

## 2024-09-30 ENCOUNTER — Ambulatory Visit (HOSPITAL_COMMUNITY): Payer: Self-pay | Admitting: Family Medicine

## 2024-10-03 ENCOUNTER — Ambulatory Visit: Payer: 59

## 2024-10-03 DIAGNOSIS — I5022 Chronic systolic (congestive) heart failure: Secondary | ICD-10-CM

## 2024-10-05 ENCOUNTER — Ambulatory Visit: Payer: Self-pay | Admitting: Cardiology

## 2024-10-05 LAB — CUP PACEART REMOTE DEVICE CHECK
Battery Remaining Longevity: 150 mo
Battery Remaining Percentage: 100 %
Brady Statistic RV Percent Paced: 0 %
Date Time Interrogation Session: 20260108041400
HighPow Impedance: 82 Ohm
Implantable Lead Connection Status: 753985
Implantable Lead Implant Date: 20220413
Implantable Lead Location: 753860
Implantable Lead Model: 183
Implantable Lead Serial Number: 303520
Implantable Pulse Generator Implant Date: 20220413
Lead Channel Impedance Value: 580 Ohm
Lead Channel Pacing Threshold Amplitude: 1 V
Lead Channel Pacing Threshold Pulse Width: 0.4 ms
Lead Channel Setting Pacing Amplitude: 2 V
Lead Channel Setting Pacing Pulse Width: 0.4 ms
Lead Channel Setting Sensing Sensitivity: 0.5 mV
Pulse Gen Serial Number: 212369
Zone Setting Status: 755011

## 2024-10-06 NOTE — Progress Notes (Signed)
 Remote ICD Transmission

## 2024-10-12 ENCOUNTER — Other Ambulatory Visit: Payer: Self-pay | Admitting: Cardiology

## 2024-10-12 DIAGNOSIS — I4891 Unspecified atrial fibrillation: Secondary | ICD-10-CM

## 2024-10-13 ENCOUNTER — Encounter: Payer: Self-pay | Admitting: Internal Medicine

## 2024-10-13 NOTE — Telephone Encounter (Signed)
 Refill request for warfarin:  Last INR was 1.9 on 09/11/24 Next INR due 10/23/24 LOV was 09/29/24  Refill approved.

## 2024-10-14 ENCOUNTER — Ambulatory Visit (HOSPITAL_COMMUNITY): Attending: Cardiology

## 2024-10-14 DIAGNOSIS — I5022 Chronic systolic (congestive) heart failure: Secondary | ICD-10-CM | POA: Insufficient documentation

## 2024-10-23 ENCOUNTER — Ambulatory Visit

## 2024-11-06 ENCOUNTER — Ambulatory Visit
